# Patient Record
Sex: Male | Born: 1950 | Race: White | Hispanic: No | Marital: Married | Smoking: Former smoker
Health system: Southern US, Community
[De-identification: ages and names within clinical notes are randomized; demographics above are authoritative.]

## PROBLEM LIST (undated history)

## (undated) DIAGNOSIS — J449 Chronic obstructive pulmonary disease, unspecified: Secondary | ICD-10-CM

## (undated) DIAGNOSIS — I209 Angina pectoris, unspecified: Secondary | ICD-10-CM

## (undated) DIAGNOSIS — Z972 Presence of dental prosthetic device (complete) (partial): Secondary | ICD-10-CM

## (undated) DIAGNOSIS — M47812 Spondylosis without myelopathy or radiculopathy, cervical region: Secondary | ICD-10-CM

## (undated) DIAGNOSIS — B009 Herpesviral infection, unspecified: Secondary | ICD-10-CM

## (undated) DIAGNOSIS — J309 Allergic rhinitis, unspecified: Secondary | ICD-10-CM

## (undated) DIAGNOSIS — E7800 Pure hypercholesterolemia, unspecified: Secondary | ICD-10-CM

## (undated) DIAGNOSIS — J432 Centrilobular emphysema: Secondary | ICD-10-CM

## (undated) DIAGNOSIS — I714 Abdominal aortic aneurysm, without rupture, unspecified: Secondary | ICD-10-CM

## (undated) DIAGNOSIS — K219 Gastro-esophageal reflux disease without esophagitis: Secondary | ICD-10-CM

## (undated) DIAGNOSIS — K649 Unspecified hemorrhoids: Secondary | ICD-10-CM

## (undated) DIAGNOSIS — I509 Heart failure, unspecified: Secondary | ICD-10-CM

## (undated) DIAGNOSIS — K5792 Diverticulitis of intestine, part unspecified, without perforation or abscess without bleeding: Secondary | ICD-10-CM

## (undated) DIAGNOSIS — E669 Obesity, unspecified: Secondary | ICD-10-CM

## (undated) DIAGNOSIS — Z8719 Personal history of other diseases of the digestive system: Secondary | ICD-10-CM

## (undated) DIAGNOSIS — I2510 Atherosclerotic heart disease of native coronary artery without angina pectoris: Secondary | ICD-10-CM

## (undated) DIAGNOSIS — R0602 Shortness of breath: Secondary | ICD-10-CM

## (undated) DIAGNOSIS — H269 Unspecified cataract: Secondary | ICD-10-CM

## (undated) DIAGNOSIS — E785 Hyperlipidemia, unspecified: Secondary | ICD-10-CM

## (undated) DIAGNOSIS — I10 Essential (primary) hypertension: Secondary | ICD-10-CM

## (undated) DIAGNOSIS — R739 Hyperglycemia, unspecified: Secondary | ICD-10-CM

## (undated) DIAGNOSIS — L309 Dermatitis, unspecified: Secondary | ICD-10-CM

## (undated) DIAGNOSIS — N200 Calculus of kidney: Secondary | ICD-10-CM

## (undated) DIAGNOSIS — M1990 Unspecified osteoarthritis, unspecified site: Secondary | ICD-10-CM

## (undated) DIAGNOSIS — Z973 Presence of spectacles and contact lenses: Secondary | ICD-10-CM

## (undated) DIAGNOSIS — H3530 Unspecified macular degeneration: Secondary | ICD-10-CM

## (undated) DIAGNOSIS — E119 Type 2 diabetes mellitus without complications: Secondary | ICD-10-CM

## (undated) HISTORY — DX: Calculus of kidney: N20.0

## (undated) HISTORY — PX: EYE SURGERY: SHX253

## (undated) HISTORY — DX: Essential (primary) hypertension: I10

## (undated) HISTORY — DX: Abdominal aortic aneurysm, without rupture, unspecified: I71.40

## (undated) HISTORY — PX: CORONARY ARTERY BYPASS GRAFT: SHX141

## (undated) HISTORY — DX: Heart failure, unspecified: I50.9

## (undated) HISTORY — DX: Hyperlipidemia, unspecified: E78.5

## (undated) HISTORY — PX: CARDIAC CATHETERIZATION: SHX172

## (undated) HISTORY — PX: TONSILLECTOMY: SUR1361

## (undated) HISTORY — DX: Abdominal aortic aneurysm, without rupture: I71.4

---

## 2005-08-02 ENCOUNTER — Emergency Department: Payer: Self-pay | Admitting: Emergency Medicine

## 2005-08-02 ENCOUNTER — Ambulatory Visit: Payer: Self-pay | Admitting: Internal Medicine

## 2005-08-03 ENCOUNTER — Inpatient Hospital Stay (HOSPITAL_COMMUNITY): Admission: AD | Admit: 2005-08-03 | Discharge: 2005-08-08 | Payer: Self-pay | Admitting: Cardiothoracic Surgery

## 2005-08-26 ENCOUNTER — Encounter: Admission: RE | Admit: 2005-08-26 | Discharge: 2005-08-26 | Payer: Self-pay | Admitting: Cardiothoracic Surgery

## 2005-09-10 ENCOUNTER — Other Ambulatory Visit: Payer: Self-pay

## 2005-09-10 ENCOUNTER — Emergency Department: Payer: Self-pay | Admitting: Emergency Medicine

## 2006-10-28 ENCOUNTER — Ambulatory Visit: Payer: Self-pay | Admitting: Emergency Medicine

## 2006-10-31 ENCOUNTER — Ambulatory Visit: Payer: Self-pay | Admitting: Emergency Medicine

## 2006-11-02 ENCOUNTER — Emergency Department: Payer: Self-pay | Admitting: Internal Medicine

## 2007-08-24 ENCOUNTER — Ambulatory Visit: Payer: Self-pay | Admitting: Gastroenterology

## 2008-08-02 ENCOUNTER — Emergency Department: Payer: Self-pay | Admitting: Emergency Medicine

## 2009-06-18 ENCOUNTER — Emergency Department: Payer: Self-pay | Admitting: Emergency Medicine

## 2011-02-07 ENCOUNTER — Encounter: Payer: Self-pay | Admitting: Otolaryngology

## 2011-02-10 ENCOUNTER — Encounter: Payer: Self-pay | Admitting: Otolaryngology

## 2011-03-12 ENCOUNTER — Encounter: Payer: Self-pay | Admitting: Otolaryngology

## 2013-05-14 ENCOUNTER — Ambulatory Visit: Payer: Self-pay | Admitting: Family Medicine

## 2013-06-10 ENCOUNTER — Other Ambulatory Visit: Payer: Self-pay | Admitting: Neurosurgery

## 2013-06-12 ENCOUNTER — Encounter (HOSPITAL_COMMUNITY): Payer: Self-pay | Admitting: Pharmacy Technician

## 2013-06-17 ENCOUNTER — Encounter (HOSPITAL_COMMUNITY): Payer: Self-pay

## 2013-06-17 ENCOUNTER — Encounter (HOSPITAL_COMMUNITY)
Admission: RE | Admit: 2013-06-17 | Discharge: 2013-06-17 | Disposition: A | Discharge status: Home / Self Care | Payer: Federal, State, Local not specified - PPO | Source: Ambulatory Visit | Attending: Neurosurgery | Admitting: Neurosurgery

## 2013-06-17 HISTORY — DX: Atherosclerotic heart disease of native coronary artery without angina pectoris: I25.10

## 2013-06-17 HISTORY — DX: Personal history of other diseases of the digestive system: Z87.19

## 2013-06-17 HISTORY — DX: Shortness of breath: R06.02

## 2013-06-17 HISTORY — DX: Chronic obstructive pulmonary disease, unspecified: J44.9

## 2013-06-17 LAB — BASIC METABOLIC PANEL
BUN: 15 mg/dL (ref 6–23)
CALCIUM: 9.4 mg/dL (ref 8.4–10.5)
CO2: 29 meq/L (ref 19–32)
Chloride: 104 mEq/L (ref 96–112)
Creatinine, Ser: 1.12 mg/dL (ref 0.50–1.35)
GFR calc Af Amer: 78 mL/min — ABNORMAL LOW (ref >90)
GFR, EST NON AFRICAN AMERICAN: 68 mL/min — AB (ref >90)
Glucose, Bld: 103 mg/dL — ABNORMAL HIGH (ref 70–99)
POTASSIUM: 4.9 meq/L (ref 3.7–5.3)
Sodium: 143 mEq/L (ref 137–147)

## 2013-06-17 LAB — SURGICAL PCR SCREEN
MRSA, PCR: NEGATIVE
STAPHYLOCOCCUS AUREUS: NEGATIVE

## 2013-06-17 LAB — CBC
HCT: 44.2 % (ref 39.0–52.0)
Hemoglobin: 15.1 g/dL (ref 13.0–17.0)
MCH: 31.7 pg (ref 26.0–34.0)
MCHC: 34.2 g/dL (ref 30.0–36.0)
MCV: 92.9 fL (ref 78.0–100.0)
Platelets: 181 10*3/uL (ref 150–400)
RBC: 4.76 MIL/uL (ref 4.22–5.81)
RDW: 12.1 % (ref 11.5–15.5)
WBC: 8.1 10*3/uL (ref 4.0–10.5)

## 2013-06-17 NOTE — Pre-Procedure Instructions (Signed)
Melvin Brewer  06/17/2013   Your procedure is scheduled on:  Thursday  06/20/13  Report to [facility]  2 * 3 at 900 AM.  Call this number if you have problems the morning of surgery: [phone]   Remember:   Do not eat food or drink liquids after midnight.   Take these medicines the morning of surgery with A SIP OF WATER:  inhalers, neurontin, hydrocodone if needed for pain, metoprolol(lopressor),  Theodur, spiriva   (STOP ASPIRIN 5 DAYS PRIOR TO SURGERY. STOP ANY HERBAL MEDICINES, NO COUMADIN, PLAVIX, OR EFFIENT.)   Do not wear jewelry, make-up or nail polish.  Do not wear lotions, powders, or perfumes. You may wear deodorant.  Do not shave 48 hours prior to surgery. Men may shave face and neck.  Do not bring valuables to the hospital.  [facility] is not responsible                  for any belongings or valuables.               Contacts, dentures or bridgework may not be worn into surgery.  Leave suitcase in the car. After surgery it may be brought to your room.  For patients admitted to the hospital, discharge time is determined by your                treatment team.               Patients discharged the day of surgery will not be allowed to drive  home.  Name and phone number of your driver:   Special Instructions:  Special Instructions: [place] - Preparing for Surgery  Before surgery, you can play an important role.  Because skin is not sterile, your skin needs to be as free of germs as possible.  You can reduce the number of germs on you skin by washing with CHG (chlorahexidine gluconate) soap before surgery.  CHG is an antiseptic cleaner which kills germs and bonds with the skin to continue killing germs even after washing.  Please DO NOT use if you have an allergy to CHG or antibacterial soaps.  If your skin becomes reddened/irritated stop using the CHG and inform your nurse when you arrive at Short Stay.  Do not shave (including legs and  underarms) for at least 48 hours prior to the first CHG shower.  You may shave your face.  Please follow these instructions carefully:   1.  Shower with CHG Soap the night before surgery and the morning of Surgery.  2.  If you choose to wash your hair, wash your hair first as usual with your normal shampoo.  3.  After you shampoo, rinse your hair and body thoroughly to remove the Shampoo.  4.  Use CHG as you would any other liquid soap. You can apply chg directly to the skin and wash gently with scrungie or a clean washcloth.  5.  Apply the CHG Soap to your body ONLY FROM THE NECK DOWN.  Do not use on open wounds or open sores.  Avoid contact with your eyes, ears, mouth and genitals (private parts).  Wash genitals (private parts with your normal soap.  6.  Wash thoroughly, paying special attention to the area where your surgery will be performed.  7.  Thoroughly rinse your body with warm water from the neck down.  8.  DO NOT shower/wash with your normal soap after using and rinsing  off the CHG Soap.  9.  Pat yourself dry with a clean towel.            10.  Wear clean pajamas.            11.  Place clean sheets on your bed the night of your first shower and do not sleep with pets.  Day of Surgery  Do not apply any lotions/deodorants the morning of surgery.  Please wear clean clothes to the hospital/surgery center.   Please read over the following fact sheets that you were given: Pain Booklet, Coughing and Deep Breathing, MRSA Information and Surgical Site Infection Prevention

## 2013-06-17 NOTE — Progress Notes (Addendum)
REQUESTED LAST CXR AND OV FROM DR. [facility] ([place] [phone])  FAXED STOP BANG LETTER TO DR. [facility] IN [place] [phone])

## 2013-06-17 NOTE — Progress Notes (Signed)
06/17/13 0916  OBSTRUCTIVE SLEEP APNEA  Have you ever been diagnosed with sleep apnea through a sleep study? No  Do you snore loudly (loud enough to be heard through closed doors)?  0  Do you often feel tired, fatigued, or sleepy during the daytime? 1  Has anyone observed you stop breathing during your sleep? 0  Do you have, or are you being treated for high blood pressure? 1  BMI more than 35 kg/m2? 0  Age over 65 years old? 1  Neck circumference greater than 40 cm/18 inches? 0  Gender: 1  Obstructive Sleep Apnea Score 4  Score 4 or greater  Results sent to PCP

## 2013-06-18 NOTE — Progress Notes (Signed)
Anesthesia Chart Review:  Patient is a 63 year old male scheduled for C5-6, C6-7 ACDF on 06/20/13 by Dr. Arnoldo Morale.  History includes CAD s/p CABG 07/2005, former smoker, COPD, hiatal hernia, tonsillectomy. PCP reported as Dr. Hortencia Pilar at [facility]. Cardiologist is Dr. Lujean Amel who cleared patient with low/mild CV risk. He gave permission to hold ASA for 5 days preoperatively.   EKG on 06/17/13 showed NSR.  Echo on 02/07/11 (Dr. Clayborn Bigness) showed EF 55%, mitral and tricuspid valve sclerosis, trace MR, mild TR, RVSP 28 mmHg.  Last cardiac cath was on 08/02/05 prior to his CABG (see report on chart).   Preoperative labs noted.  CXR report is still pending from his pulmonologist Dr. Raul Del.    He has been cleared by cardiology, so anticipate that he can proceed as planned in no acute changes.  George Hugh [facility] Short Stay Center/Anesthesiology Phone [phone] 06/18/2013 5:23 PM

## 2013-06-19 MED ORDER — CEFAZOLIN SODIUM-DEXTROSE 2-3 GM-% IV SOLR
2.0000 g | INTRAVENOUS | Status: AC
  Administered 2013-06-20: 2 g via INTRAVENOUS
  Filled 2013-06-19: qty 50

## 2013-06-20 ENCOUNTER — Ambulatory Visit (HOSPITAL_COMMUNITY): Payer: Federal, State, Local not specified - PPO

## 2013-06-20 ENCOUNTER — Ambulatory Visit (HOSPITAL_COMMUNITY): Payer: Federal, State, Local not specified - PPO | Admitting: Certified Registered Nurse Anesthetist

## 2013-06-20 ENCOUNTER — Encounter (HOSPITAL_COMMUNITY)
Admission: RE | Disposition: A | Discharge status: Home / Self Care | Payer: Self-pay | Source: Ambulatory Visit | Attending: Neurosurgery

## 2013-06-20 ENCOUNTER — Ambulatory Visit (HOSPITAL_COMMUNITY)
Admission: RE | Admit: 2013-06-20 | Discharge: 2013-06-21 | Disposition: A | Discharge status: Home / Self Care | Payer: Federal, State, Local not specified - PPO | Source: Ambulatory Visit | Attending: Neurosurgery | Admitting: Neurosurgery

## 2013-06-20 ENCOUNTER — Encounter (HOSPITAL_COMMUNITY): Payer: Federal, State, Local not specified - PPO | Admitting: Vascular Surgery

## 2013-06-20 ENCOUNTER — Encounter (HOSPITAL_COMMUNITY): Payer: Self-pay | Admitting: Certified Registered Nurse Anesthetist

## 2013-06-20 DIAGNOSIS — J4489 Other specified chronic obstructive pulmonary disease: Secondary | ICD-10-CM | POA: Insufficient documentation

## 2013-06-20 DIAGNOSIS — M4712 Other spondylosis with myelopathy, cervical region: Secondary | ICD-10-CM | POA: Insufficient documentation

## 2013-06-20 DIAGNOSIS — Z01812 Encounter for preprocedural laboratory examination: Secondary | ICD-10-CM | POA: Insufficient documentation

## 2013-06-20 DIAGNOSIS — Z882 Allergy status to sulfonamides status: Secondary | ICD-10-CM | POA: Insufficient documentation

## 2013-06-20 DIAGNOSIS — R0602 Shortness of breath: Secondary | ICD-10-CM | POA: Insufficient documentation

## 2013-06-20 DIAGNOSIS — Z01810 Encounter for preprocedural cardiovascular examination: Secondary | ICD-10-CM | POA: Insufficient documentation

## 2013-06-20 DIAGNOSIS — E669 Obesity, unspecified: Secondary | ICD-10-CM | POA: Insufficient documentation

## 2013-06-20 DIAGNOSIS — J449 Chronic obstructive pulmonary disease, unspecified: Secondary | ICD-10-CM | POA: Insufficient documentation

## 2013-06-20 DIAGNOSIS — Z87891 Personal history of nicotine dependence: Secondary | ICD-10-CM | POA: Insufficient documentation

## 2013-06-20 DIAGNOSIS — M5020 Other cervical disc displacement, unspecified cervical region: Secondary | ICD-10-CM | POA: Insufficient documentation

## 2013-06-20 DIAGNOSIS — I2510 Atherosclerotic heart disease of native coronary artery without angina pectoris: Secondary | ICD-10-CM | POA: Insufficient documentation

## 2013-06-20 HISTORY — PX: ANTERIOR CERVICAL DECOMP/DISCECTOMY FUSION: SHX1161

## 2013-06-20 SURGERY — ANTERIOR CERVICAL DECOMPRESSION/DISCECTOMY FUSION 2 LEVELS
Anesthesia: General | Site: Spine Cervical

## 2013-06-20 MED ORDER — ATORVASTATIN CALCIUM 80 MG PO TABS
80.0000 mg | ORAL_TABLET | Freq: Every day | ORAL | Status: DC
  Administered 2013-06-20 – 2013-06-21 (×2): 80 mg via ORAL
  Filled 2013-06-20 (×2): qty 1

## 2013-06-20 MED ORDER — GLYCOPYRROLATE 0.2 MG/ML IJ SOLN
INTRAMUSCULAR | Status: AC
  Filled 2013-06-20: qty 4

## 2013-06-20 MED ORDER — OXYCODONE-ACETAMINOPHEN 5-325 MG PO TABS: 1.0000 | ORAL_TABLET | ORAL | Status: DC | PRN

## 2013-06-20 MED ORDER — MIDAZOLAM HCL 5 MG/5ML IJ SOLN
INTRAMUSCULAR | Status: DC | PRN
  Administered 2013-06-20: 2 mg via INTRAVENOUS

## 2013-06-20 MED ORDER — HYDROCODONE-ACETAMINOPHEN 10-325 MG PO TABS: 0.5000 | ORAL_TABLET | ORAL | Status: DC | PRN

## 2013-06-20 MED ORDER — HEMOSTATIC AGENTS (NO CHARGE) OPTIME
TOPICAL | Status: DC | PRN
  Administered 2013-06-20: 1 via TOPICAL

## 2013-06-20 MED ORDER — LIDOCAINE HCL (CARDIAC) 20 MG/ML IV SOLN
INTRAVENOUS | Status: AC
  Filled 2013-06-20: qty 10

## 2013-06-20 MED ORDER — LIDOCAINE HCL 4 % MT SOLN
OROMUCOSAL | Status: DC | PRN
  Administered 2013-06-20: 4 mL via TOPICAL

## 2013-06-20 MED ORDER — LACTATED RINGERS IV SOLN
INTRAVENOUS | Status: DC | PRN
  Administered 2013-06-20 (×2): via INTRAVENOUS

## 2013-06-20 MED ORDER — ONDANSETRON HCL 4 MG/2ML IJ SOLN: 4.0000 mg | INTRAMUSCULAR | Status: DC | PRN

## 2013-06-20 MED ORDER — ARTIFICIAL TEARS OP OINT
TOPICAL_OINTMENT | OPHTHALMIC | Status: DC | PRN
  Administered 2013-06-20: 1 via OPHTHALMIC

## 2013-06-20 MED ORDER — PANTOPRAZOLE SODIUM 40 MG IV SOLR
40.0000 mg | Freq: Every day | INTRAVENOUS | Status: DC
  Administered 2013-06-20: 40 mg via INTRAVENOUS
  Filled 2013-06-20 (×2): qty 40

## 2013-06-20 MED ORDER — DOCUSATE SODIUM 100 MG PO CAPS
100.0000 mg | ORAL_CAPSULE | Freq: Two times a day (BID) | ORAL | Status: DC
  Administered 2013-06-20 – 2013-06-21 (×2): 100 mg via ORAL
  Filled 2013-06-20 (×3): qty 1

## 2013-06-20 MED ORDER — HYDROMORPHONE HCL PF 1 MG/ML IJ SOLN
0.2500 mg | INTRAMUSCULAR | Status: DC | PRN
  Administered 2013-06-20 (×2): 0.5 mg via INTRAVENOUS

## 2013-06-20 MED ORDER — PROPOFOL 10 MG/ML IV BOLUS
INTRAVENOUS | Status: DC | PRN
  Administered 2013-06-20: 180 mg via INTRAVENOUS

## 2013-06-20 MED ORDER — FLUTICASONE PROPIONATE 50 MCG/ACT NA SUSP
1.0000 | Freq: Every day | NASAL | Status: DC
  Filled 2013-06-20: qty 16

## 2013-06-20 MED ORDER — FENTANYL CITRATE 0.05 MG/ML IJ SOLN
INTRAMUSCULAR | Status: AC
  Filled 2013-06-20: qty 5

## 2013-06-20 MED ORDER — OLODATEROL HCL 2.5 MCG/ACT IN AERS
1.0000 | INHALATION_SPRAY | Freq: Two times a day (BID) | RESPIRATORY_TRACT | Status: DC
  Administered 2013-06-21: 1 via RESPIRATORY_TRACT

## 2013-06-20 MED ORDER — OXYCODONE HCL 5 MG/5ML PO SOLN: 5.0000 mg | Freq: Once | ORAL | Status: DC | PRN

## 2013-06-20 MED ORDER — NEOSTIGMINE METHYLSULFATE 1 MG/ML IJ SOLN
INTRAMUSCULAR | Status: DC | PRN
  Administered 2013-06-20: 5 mg via INTRAVENOUS

## 2013-06-20 MED ORDER — ALBUMIN HUMAN 5 % IV SOLN
INTRAVENOUS | Status: DC | PRN
  Administered 2013-06-20: 14:00:00 via INTRAVENOUS

## 2013-06-20 MED ORDER — ONDANSETRON HCL 4 MG/2ML IJ SOLN
INTRAMUSCULAR | Status: AC
  Filled 2013-06-20: qty 2

## 2013-06-20 MED ORDER — OXYCODONE HCL 5 MG PO TABS: 5.0000 mg | ORAL_TABLET | Freq: Once | ORAL | Status: DC | PRN

## 2013-06-20 MED ORDER — ALBUTEROL SULFATE (2.5 MG/3ML) 0.083% IN NEBU: 3.0000 mL | INHALATION_SOLUTION | Freq: Four times a day (QID) | RESPIRATORY_TRACT | Status: DC | PRN

## 2013-06-20 MED ORDER — PROMETHAZINE HCL 25 MG/ML IJ SOLN
6.2500 mg | INTRAMUSCULAR | Status: DC | PRN
Start: 2013-06-20 — End: 2013-06-20

## 2013-06-20 MED ORDER — ACETAMINOPHEN 650 MG RE SUPP: 650.0000 mg | RECTAL | Status: DC | PRN

## 2013-06-20 MED ORDER — BUPIVACAINE-EPINEPHRINE PF 0.5-1:200000 % IJ SOLN
INTRAMUSCULAR | Status: DC | PRN
  Administered 2013-06-20: 10 mL

## 2013-06-20 MED ORDER — MENTHOL 3 MG MT LOZG
1.0000 | LOZENGE | OROMUCOSAL | Status: DC | PRN
  Administered 2013-06-20: 3 mg via ORAL
  Filled 2013-06-20: qty 9

## 2013-06-20 MED ORDER — MORPHINE SULFATE 2 MG/ML IJ SOLN
1.0000 mg | INTRAMUSCULAR | Status: DC | PRN
  Administered 2013-06-20: 2 mg via INTRAVENOUS
  Filled 2013-06-20: qty 1

## 2013-06-20 MED ORDER — THEOPHYLLINE ER 200 MG PO TB12
200.0000 mg | ORAL_TABLET | Freq: Two times a day (BID) | ORAL | Status: DC
  Administered 2013-06-20 – 2013-06-21 (×2): 200 mg via ORAL
  Filled 2013-06-20 (×3): qty 1

## 2013-06-20 MED ORDER — ARTIFICIAL TEARS OP OINT
TOPICAL_OINTMENT | OPHTHALMIC | Status: AC
  Filled 2013-06-20: qty 3.5

## 2013-06-20 MED ORDER — DEXAMETHASONE SODIUM PHOSPHATE 4 MG/ML IJ SOLN: 4.0000 mg | Freq: Four times a day (QID) | INTRAMUSCULAR | Status: AC

## 2013-06-20 MED ORDER — MIDAZOLAM HCL 2 MG/2ML IJ SOLN
INTRAMUSCULAR | Status: AC
  Filled 2013-06-20: qty 2

## 2013-06-20 MED ORDER — PHENOL 1.4 % MT LIQD: 1.0000 | OROMUCOSAL | Status: DC | PRN

## 2013-06-20 MED ORDER — TIOTROPIUM BROMIDE MONOHYDRATE 18 MCG IN CAPS
18.0000 ug | ORAL_CAPSULE | Freq: Every day | RESPIRATORY_TRACT | Status: DC
  Administered 2013-06-21: 18 ug via RESPIRATORY_TRACT
  Filled 2013-06-20: qty 5

## 2013-06-20 MED ORDER — BACITRACIN ZINC 500 UNIT/GM EX OINT
TOPICAL_OINTMENT | CUTANEOUS | Status: DC | PRN
  Administered 2013-06-20: 1 via TOPICAL

## 2013-06-20 MED ORDER — ROCURONIUM BROMIDE 50 MG/5ML IV SOLN
INTRAVENOUS | Status: AC
  Filled 2013-06-20: qty 1

## 2013-06-20 MED ORDER — 0.9 % SODIUM CHLORIDE (POUR BTL) OPTIME
TOPICAL | Status: DC | PRN
  Administered 2013-06-20: 1000 mL

## 2013-06-20 MED ORDER — PHENYLEPHRINE HCL 10 MG/ML IJ SOLN
INTRAMUSCULAR | Status: DC | PRN
  Administered 2013-06-20: 80 ug via INTRAVENOUS
  Administered 2013-06-20: 40 ug via INTRAVENOUS
  Administered 2013-06-20: 80 ug via INTRAVENOUS
  Administered 2013-06-20 (×2): 40 ug via INTRAVENOUS
  Administered 2013-06-20: 80 ug via INTRAVENOUS
  Administered 2013-06-20: 40 ug via INTRAVENOUS

## 2013-06-20 MED ORDER — ALUM & MAG HYDROXIDE-SIMETH 200-200-20 MG/5ML PO SUSP: 30.0000 mL | Freq: Four times a day (QID) | ORAL | Status: DC | PRN

## 2013-06-20 MED ORDER — LACTATED RINGERS IV SOLN
INTRAVENOUS | Status: DC
Start: 2013-06-20 — End: 2013-06-21
  Administered 2013-06-20: 09:00:00 via INTRAVENOUS

## 2013-06-20 MED ORDER — AMITRIPTYLINE HCL 50 MG PO TABS
50.0000 mg | ORAL_TABLET | Freq: Every day | ORAL | Status: DC
  Administered 2013-06-20: 50 mg via ORAL
  Filled 2013-06-20 (×2): qty 1

## 2013-06-20 MED ORDER — PHENYLEPHRINE HCL 10 MG/ML IJ SOLN
10.0000 mg | INTRAVENOUS | Status: DC | PRN
  Administered 2013-06-20: 10 ug/min via INTRAVENOUS

## 2013-06-20 MED ORDER — DEXAMETHASONE 4 MG PO TABS
4.0000 mg | ORAL_TABLET | Freq: Four times a day (QID) | ORAL | Status: AC
  Administered 2013-06-20 – 2013-06-21 (×3): 4 mg via ORAL
  Filled 2013-06-20 (×3): qty 1

## 2013-06-20 MED ORDER — HYDROCODONE-ACETAMINOPHEN 5-325 MG PO TABS
1.0000 | ORAL_TABLET | ORAL | Status: DC | PRN
  Administered 2013-06-20 – 2013-06-21 (×2): 1 via ORAL
  Filled 2013-06-20 (×2): qty 1

## 2013-06-20 MED ORDER — GABAPENTIN 300 MG PO CAPS
300.0000 mg | ORAL_CAPSULE | Freq: Three times a day (TID) | ORAL | Status: DC
  Administered 2013-06-20 – 2013-06-21 (×3): 300 mg via ORAL
  Filled 2013-06-20 (×5): qty 1

## 2013-06-20 MED ORDER — GLYCOPYRROLATE 0.2 MG/ML IJ SOLN
INTRAMUSCULAR | Status: DC | PRN
  Administered 2013-06-20: .8 mg via INTRAVENOUS

## 2013-06-20 MED ORDER — DIAZEPAM 5 MG PO TABS: 5.0000 mg | ORAL_TABLET | Freq: Four times a day (QID) | ORAL | Status: DC | PRN

## 2013-06-20 MED ORDER — THROMBIN 5000 UNITS EX SOLR
CUTANEOUS | Status: DC | PRN
  Administered 2013-06-20 (×2): 5000 [IU] via TOPICAL

## 2013-06-20 MED ORDER — FENTANYL CITRATE 0.05 MG/ML IJ SOLN
INTRAMUSCULAR | Status: DC | PRN
  Administered 2013-06-20 (×2): 50 ug via INTRAVENOUS
  Administered 2013-06-20: 100 ug via INTRAVENOUS
  Administered 2013-06-20: 50 ug via INTRAVENOUS

## 2013-06-20 MED ORDER — LACTATED RINGERS IV SOLN: INTRAVENOUS | Status: DC

## 2013-06-20 MED ORDER — ACETAMINOPHEN 325 MG PO TABS: 650.0000 mg | ORAL_TABLET | ORAL | Status: DC | PRN

## 2013-06-20 MED ORDER — CEFAZOLIN SODIUM-DEXTROSE 2-3 GM-% IV SOLR
2.0000 g | Freq: Three times a day (TID) | INTRAVENOUS | Status: AC
  Administered 2013-06-20 – 2013-06-21 (×2): 2 g via INTRAVENOUS
  Filled 2013-06-20 (×2): qty 50

## 2013-06-20 MED ORDER — SODIUM CHLORIDE 0.9 % IR SOLN
Status: DC | PRN
  Administered 2013-06-20: 13:00:00

## 2013-06-20 MED ORDER — MIDAZOLAM HCL 2 MG/2ML IJ SOLN: 1.0000 mg | INTRAMUSCULAR | Status: DC | PRN

## 2013-06-20 MED ORDER — LIDOCAINE HCL (CARDIAC) 20 MG/ML IV SOLN
INTRAVENOUS | Status: DC | PRN
  Administered 2013-06-20: 100 mg via INTRAVENOUS

## 2013-06-20 MED ORDER — STERILE WATER FOR INJECTION IJ SOLN
INTRAMUSCULAR | Status: AC
  Filled 2013-06-20: qty 10

## 2013-06-20 MED ORDER — PHENYLEPHRINE 40 MCG/ML (10ML) SYRINGE FOR IV PUSH (FOR BLOOD PRESSURE SUPPORT)
PREFILLED_SYRINGE | INTRAVENOUS | Status: AC
  Filled 2013-06-20: qty 10

## 2013-06-20 MED ORDER — PROPOFOL 10 MG/ML IV BOLUS
INTRAVENOUS | Status: AC
  Filled 2013-06-20: qty 20

## 2013-06-20 MED ORDER — HYDROMORPHONE HCL PF 1 MG/ML IJ SOLN
INTRAMUSCULAR | Status: AC
Start: 2013-06-20 — End: 2013-06-21
  Filled 2013-06-20: qty 1

## 2013-06-20 MED ORDER — FENTANYL CITRATE 0.05 MG/ML IJ SOLN: 50.0000 ug | Freq: Once | INTRAMUSCULAR | Status: DC

## 2013-06-20 MED ORDER — ROCURONIUM BROMIDE 100 MG/10ML IV SOLN
INTRAVENOUS | Status: DC | PRN
  Administered 2013-06-20: 50 mg via INTRAVENOUS

## 2013-06-20 MED ORDER — METOPROLOL TARTRATE 50 MG PO TABS
50.0000 mg | ORAL_TABLET | Freq: Two times a day (BID) | ORAL | Status: DC
Start: 2013-06-20 — End: 2013-06-21
  Administered 2013-06-20 – 2013-06-21 (×2): 50 mg via ORAL
  Filled 2013-06-20 (×3): qty 1

## 2013-06-20 MED ORDER — EPHEDRINE SULFATE 50 MG/ML IJ SOLN
INTRAMUSCULAR | Status: AC
Start: 2013-06-20 — End: 2013-06-20
  Filled 2013-06-20: qty 1

## 2013-06-20 MED ORDER — ONDANSETRON HCL 4 MG/2ML IJ SOLN
INTRAMUSCULAR | Status: DC | PRN
  Administered 2013-06-20: 4 mg via INTRAVENOUS

## 2013-06-20 SURGICAL SUPPLY — 65 items
APL SKNCLS STERI-STRIP NONHPOA (GAUZE/BANDAGES/DRESSINGS) ×1
BAG DECANTER FOR FLEXI CONT (MISCELLANEOUS) ×2 IMPLANT
BENZOIN TINCTURE PRP APPL 2/3 (GAUZE/BANDAGES/DRESSINGS) ×3 IMPLANT
BIT DRILL NEURO 2X3.1 SFT TUCH (MISCELLANEOUS) ×1 IMPLANT
BLADE SURG 15 STRL LF DISP TIS (BLADE) ×1 IMPLANT
BLADE SURG 15 STRL SS (BLADE) ×2
BLADE ULTRA TIP 2M (BLADE) ×2 IMPLANT
BRUSH SCRUB EZ PLAIN DRY (MISCELLANEOUS) ×2 IMPLANT
BUR BARREL STRAIGHT FLUTE 4.0 (BURR) ×2 IMPLANT
BUR MATCHSTICK NEURO 3.0 LAGG (BURR) ×2 IMPLANT
CANISTER SUCT 3000ML (MISCELLANEOUS) ×2 IMPLANT
CONT SPEC 4OZ CLIKSEAL STRL BL (MISCELLANEOUS) ×2 IMPLANT
COVER MAYO STAND STRL (DRAPES) ×2 IMPLANT
DRAPE LAPAROTOMY 100X72 PEDS (DRAPES) ×2 IMPLANT
DRAPE MICROSCOPE LEICA (MISCELLANEOUS) IMPLANT
DRAPE POUCH INSTRU U-SHP 10X18 (DRAPES) ×2 IMPLANT
DRAPE SURG 17X23 STRL (DRAPES) ×4 IMPLANT
DRILL NEURO 2X3.1 SOFT TOUCH (MISCELLANEOUS) ×2
ELECT REM PT RETURN 9FT ADLT (ELECTROSURGICAL) ×2
ELECTRODE REM PT RTRN 9FT ADLT (ELECTROSURGICAL) ×1 IMPLANT
GAUZE SPONGE 4X4 16PLY XRAY LF (GAUZE/BANDAGES/DRESSINGS) IMPLANT
GLOVE BIO SURGEON STRL SZ8.5 (GLOVE) ×2 IMPLANT
GLOVE BIOGEL PI IND STRL 7.0 (GLOVE) IMPLANT
GLOVE BIOGEL PI IND STRL 7.5 (GLOVE) IMPLANT
GLOVE BIOGEL PI INDICATOR 7.0 (GLOVE) ×3
GLOVE BIOGEL PI INDICATOR 7.5 (GLOVE) ×1
GLOVE ECLIPSE 7.5 STRL STRAW (GLOVE) ×2 IMPLANT
GLOVE ECLIPSE 8.0 STRL XLNG CF (GLOVE) ×1 IMPLANT
GLOVE EXAM NITRILE LRG STRL (GLOVE) IMPLANT
GLOVE EXAM NITRILE MD LF STRL (GLOVE) ×1 IMPLANT
GLOVE EXAM NITRILE XL STR (GLOVE) IMPLANT
GLOVE EXAM NITRILE XS STR PU (GLOVE) IMPLANT
GLOVE SS BIOGEL STRL SZ 6.5 (GLOVE) IMPLANT
GLOVE SS BIOGEL STRL SZ 8 (GLOVE) ×1 IMPLANT
GLOVE SUPERSENSE BIOGEL SZ 6.5 (GLOVE) ×4
GLOVE SUPERSENSE BIOGEL SZ 8 (GLOVE) ×1
GOWN BRE IMP SLV AUR LG STRL (GOWN DISPOSABLE) ×2 IMPLANT
GOWN BRE IMP SLV AUR XL STRL (GOWN DISPOSABLE) ×2 IMPLANT
KIT BASIN OR (CUSTOM PROCEDURE TRAY) ×2 IMPLANT
KIT ROOM TURNOVER OR (KITS) ×2 IMPLANT
MARKER SKIN DUAL TIP RULER LAB (MISCELLANEOUS) ×2 IMPLANT
NDL SPNL 18GX3.5 QUINCKE PK (NEEDLE) ×1 IMPLANT
NEEDLE HYPO 22GX1.5 SAFETY (NEEDLE) ×2 IMPLANT
NEEDLE SPNL 18GX3.5 QUINCKE PK (NEEDLE) ×2 IMPLANT
NS IRRIG 1000ML POUR BTL (IV SOLUTION) ×2 IMPLANT
PACK LAMINECTOMY NEURO (CUSTOM PROCEDURE TRAY) ×2 IMPLANT
PEEK VISTA 14X14X7MM (Peek) ×2 IMPLANT
PIN DISTRACTION 14MM (PIN) ×4 IMPLANT
PLATE ANT CERV XTEND 2 LV 32 (Plate) ×1 IMPLANT
PUTTY 5ML ACTIFUSE ABX (Putty) ×1 IMPLANT
RUBBERBAND STERILE (MISCELLANEOUS) ×2 IMPLANT
SCREW XTD VAR 4.2 SELF TAP (Screw) ×6 IMPLANT
SPONGE GAUZE 4X4 12PLY (GAUZE/BANDAGES/DRESSINGS) ×2 IMPLANT
SPONGE INTESTINAL PEANUT (DISPOSABLE) ×4 IMPLANT
SPONGE SURGIFOAM ABS GEL SZ50 (HEMOSTASIS) ×2 IMPLANT
STRIP CLOSURE SKIN 1/2X4 (GAUZE/BANDAGES/DRESSINGS) ×2 IMPLANT
SUT VIC AB 0 CT1 27 (SUTURE) ×2
SUT VIC AB 0 CT1 27XBRD ANTBC (SUTURE) ×1 IMPLANT
SUT VIC AB 3-0 SH 8-18 (SUTURE) ×2 IMPLANT
SYR 20ML ECCENTRIC (SYRINGE) ×2 IMPLANT
TAPE CLOTH SURG 4X10 WHT LF (GAUZE/BANDAGES/DRESSINGS) ×1 IMPLANT
TAPE STRIPS DRAPE STRL (GAUZE/BANDAGES/DRESSINGS) ×1 IMPLANT
TOWEL OR 17X24 6PK STRL BLUE (TOWEL DISPOSABLE) ×2 IMPLANT
TOWEL OR 17X26 10 PK STRL BLUE (TOWEL DISPOSABLE) ×2 IMPLANT
WATER STERILE IRR 1000ML POUR (IV SOLUTION) ×2 IMPLANT

## 2013-06-20 NOTE — Op Note (Signed)
Brief history: The patient is a 63 year old white male who has complained of neck and arm pain consistent with a cervical radiculopathy. He has failed medical management and was worked up with a cervical MRI. This demonstrated patient had herniated disc at C6-7 and spondylosis at C5-C6. I discussed the various treatment options with the patient including surgery. He has weighed the risks, benefits, and alternatives surgery and decided proceed with a C5-C6 and C6-7 anterior cervical discectomy, fusion, and plating.  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 Earle Gell  Asst.: Dr. Karie Chimera  Anesthesia: Gen. endotracheal  Estimated blood loss: 100 cc  Drains: None  Complications: None  Description of procedure: The patient was brought to the operating room by the anesthesia team. General endotracheal anesthesia was induced. A roll was placed under the patient's shoulders to keep the neck in the neutral position. The patient's anterior cervical region was then prepared with Betadine scrub and Betadine solution. Sterile drapes were applied.  The area to be incised was then injected with Marcaine with epinephrine solution. I then used a scalpel to make a transverse incision in the patient's left anterior neck. I used the Metzenbaum scissors to divide the platysmal muscle and then to dissect medial to the sternocleidomastoid muscle, jugular vein, and carotid artery. I carefully dissected down towards the anterior cervical spine identifying the  esophagus and retracting it medially. Then using Kitner swabs to clear soft tissue from the anterior cervical spine. We then inserted a bent spinal needle into the upper exposed intervertebral disc space. We then obtained intraoperative radiographs confirm our location.  I then used electrocautery to detach the medial border of the longus colli muscle bilaterally from the C5-6 and C6-7 intervertebral disc spaces. I then inserted the Caspar self-retaining retractor underneath the longus colli muscle bilaterally to provide exposure.  We then incised the intervertebral disc at C5-6. We then performed a partial intervertebral discectomy with a pituitary forceps and the Karlin curettes. I then inserted distraction screws into the vertebral bodies at C5 and C6. We then distracted the interspace. We then used the high-speed drill to decorticate the vertebral endplates at Q6-7, to drill away the remainder of the intervertebral disc, to drill away some posterior spondylosis, and to thin out the posterior longitudinal ligament. I then incised ligament with the arachnoid knife. We then removed the ligament with a Kerrison punches undercutting the vertebral endplates and decompressing the thecal sac. We then performed foraminotomies about the bilateral C6 nerve roots. This completed the decompression at this level.  We then repeated this procedure in an analogous fashion at C6-7 decompressing the thecal sac and the bile C7 nerve roots.  We now turned our to attention to the interbody fusion. We used the trial spacers to determine the appropriate size for the interbody prosthesis. We then pre-filled prosthesis with a combination of local morcellized autograft bone that we obtained during decompression as well as Actifuse bone graft extender. We then inserted the prosthesis into the distracted interspace at C5-6 and C6-7. We then removed the distraction screws. There was a good snug fit of the prosthesis in the  interspace.  Having completed the fusion we  now turned attention to the anterior spinal instrumentation. We used the high-speed drill to drill away some anterior spondylosis at the disc spaces so that the plate lay down flat. We selected the appropriate length titanium anterior cervical plate. We laid it along the anterior aspect of the vertebral bodies from C5-C7. We then drilled 14 mm holes at C5, C6 and C7. We then secured the plate to the vertebral bodies by placing two 14 mm self-tapping screws at C5, C6 and C7. We then obtained intraoperative radiograph. The demonstrating good position of the instrumentation. We therefore secured the screws the plate the locking each cam. This completed the instrumentation.  We then obtained hemostasis using bipolar electrocautery. We irrigated the wound out with bacitracin solution. We then removed the retractor. We inspected the esophagus for any damage. There was none apparent. We then reapproximated patient's platysmal muscle with interrupted 3-0 Vicryl suture. We then reapproximated the subcutaneous tissue with interrupted 3-0 Vicryl suture. The skin was reapproximated with Steri-Strips and benzoin. The wound was then covered with bacitracin ointment. A sterile dressing was applied. The drapes were removed. Patient was subsequently extubated by the anesthesia team and transported to the post anesthesia care unit in stable condition. All sponge instrument and needle counts were reportedly correct at the end of this case.

## 2013-06-20 NOTE — Transfer of Care (Signed)
Immediate Anesthesia Transfer of Care Note  Patient: Melvin Brewer  Procedure(s) Performed: Procedure(s) with comments: CERVCIAL FIVE-SIX,CERVICAL SIX-SEVEN ANTERIOR CERVICAL DECOMPRESSION WITH FUSION INTERBODY PROSTHESIS PLATING AND PEEK CAGE (N/A) - anterior  Patient Location: PACU  Anesthesia Type:General  Level of Consciousness: awake and alert   Airway & Oxygen Therapy: Patient Spontanous Breathing and Patient connected to nasal cannula oxygen  Post-op Assessment: Report given to PACU RN, Post -op Vital signs reviewed and stable and Patient moving all extremities X 4  Post vital signs: Reviewed and stable  Complications: No apparent anesthesia complications

## 2013-06-20 NOTE — Anesthesia Preprocedure Evaluation (Addendum)
Anesthesia Evaluation  Patient identified by MRN, date of birth, ID band Patient awake    Reviewed: Allergy & Precautions, H&P , NPO status , Patient's Chart, lab work & pertinent test results  Airway Mallampati: II TM Distance: >3 FB Neck ROM: Limited    Dental  (+) Dental Advisory Given, Partial Upper, Partial Lower   Pulmonary shortness of breath and with exertion, COPD COPD inhaler, former smoker,  + rhonchi         Cardiovascular + CAD and + CABG Rhythm:Regular Rate:Normal     Neuro/Psych    GI/Hepatic hiatal hernia,   Endo/Other    Renal/GU      Musculoskeletal   Abdominal (+) + obese,   Peds  Hematology   Anesthesia Other Findings   Reproductive/Obstetrics                         Anesthesia Physical Anesthesia Plan  ASA: III  Anesthesia Plan: General   Post-op Pain Management:    Induction: Intravenous  Airway Management Planned: Oral ETT and Video Laryngoscope Planned  Additional Equipment:   Intra-op Plan:   Post-operative Plan: Extubation in OR  Informed Consent: I have reviewed the patients History and Physical, chart, labs and discussed the procedure including the risks, benefits and alternatives for the proposed anesthesia with the patient or authorized representative who has indicated his/her understanding and acceptance.   Dental advisory given  Plan Discussed with: CRNA and Surgeon  Anesthesia Plan Comments:        Anesthesia Quick Evaluation

## 2013-06-20 NOTE — Anesthesia Procedure Notes (Signed)
Procedure Name: Intubation Date/Time: 06/20/2013 11:49 AM Performed by: Blair Heys E Pre-anesthesia Checklist: Patient identified, Emergency Drugs available, Suction available and Patient being monitored Patient Re-evaluated:Patient Re-evaluated prior to inductionOxygen Delivery Method: Circle system utilized Preoxygenation: Pre-oxygenation with 100% oxygen Intubation Type: IV induction Ventilation: Mask ventilation without difficulty and Oral airway inserted - appropriate to patient size Grade View: Grade I Tube type: Oral Tube size: 7.5 mm Number of attempts: 1 Airway Equipment and Method: Stylet,  Oral airway and Video-laryngoscopy Placement Confirmation: ETT inserted through vocal cords under direct vision,  positive ETCO2 and breath sounds checked- equal and bilateral Secured at: 22 cm Tube secured with: Tape Dental Injury: Teeth and Oropharynx as per pre-operative assessment

## 2013-06-20 NOTE — Anesthesia Postprocedure Evaluation (Signed)
Anesthesia Post Note  Patient: Melvin Brewer  Procedure(s) Performed: Procedure(s) (LRB): CERVCIAL FIVE-SIX,CERVICAL SIX-SEVEN ANTERIOR CERVICAL DECOMPRESSION WITH FUSION INTERBODY PROSTHESIS PLATING AND PEEK CAGE (N/A)  Anesthesia type: general  Patient location: PACU  Post pain: Pain level controlled  Post assessment: Patient's Cardiovascular Status Stable  Last Vitals:  Filed Vitals:   06/20/13 1600  BP:   Pulse: 77  Temp: 36.4 C  Resp: 20    Post vital signs: Reviewed and stable  Level of consciousness: sedated  Complications: No apparent anesthesia complications

## 2013-06-20 NOTE — Preoperative (Signed)
Beta Blockers   Reason not to administer Beta Blockers:Not Applicable, patient took Metoprolol 06/20/13.

## 2013-06-20 NOTE — Progress Notes (Signed)
Subjective:  The patient is alert. He is in no apparent distress.  Objective: Vital signs in last 24 hours: Temp:  [97.3 F (36.3 C)] 97.3 F (36.3 C) (03/12 0853) Pulse Rate:  [69] 69 (03/12 0853) Resp:  [20] 20 (03/12 0853) BP: (155)/(86) 155/86 mmHg (03/12 0853) SpO2:  [99 %] 99 % (03/12 0853)  Intake/Output from previous day:   Intake/Output this shift: Total I/O In: 2050 [I.V.:1800; IV Piggyback:250] Out: 200 [Blood:200]  Physical exam the patient is moving all 4 extremities well. His dressing is clean and dry. There is no evidence of hematoma or shift.  Lab Results: No results found for this basename: WBC, HGB, HCT, PLT,  in the last 72 hours BMET No results found for this basename: NA, K, CL, CO2, GLUCOSE, BUN, CREATININE, CALCIUM,  in the last 72 hours  Studies/Results: Dg Chest 2 View  06/20/2013   CLINICAL DATA:  Preop cervical fusion  EXAM: CHEST  2 VIEW  COMPARISON:  08/26/2005  FINDINGS: The heart size and mediastinal contours are within normal limits. Both lungs are clear. The visualized skeletal structures are unremarkable.  IMPRESSION: No active cardiopulmonary disease.   Electronically Signed   By: Franchot Gallo M.D.   On: 06/20/2013 09:47   Dg Cervical Spine 2-3 Views  06/20/2013   CLINICAL DATA:  Surgical localization images of the cervical spine  EXAM: CERVICAL SPINE - 2-3 VIEW  COMPARISON:  None.  FINDINGS: Initial lateral image shows the surgical probe with its tip superimposed over the C4-C5 disc space.  Followup image shows placement of an anterior fusion plate and fixation screws spanning C5-C7 with bone graft material maintaining disc space height.  No acute fracture or evidence of an operative complication.  IMPRESSION: Operative localization images of the cervical spine as described.   Electronically Signed   By: Lajean Manes M.D.   On: 06/20/2013 14:45    Assessment/Plan: The patient is doing well.  LOS: 0 days     Abygale Karpf D 06/20/2013,  3:06 PM

## 2013-06-20 NOTE — H&P (Signed)
Subjective: The patient is a 63 year old white male who has complained of neck and arm pain consistent with a cervical radiculopathy. He has failed medical management and was worked up with a cervical MRI. This demonstrated disc degeneration, spondylosis, stenosis, etc. at C5-6 and C6-7. I discussed the various treatment option with the patient including surgery. He has weighed the risks, benefits, and alternatives surgery and decided proceed with a C5-C6 and C6-7 anterior cervical discectomy, fusion, and plating.   Past Medical History  Diagnosis Date  . Coronary artery disease   . COPD (chronic obstructive pulmonary disease)   . Shortness of breath     W/ EXERTION   . H/O hiatal hernia     AGE 82 S  NO MEDS    Past Surgical History  Procedure Laterality Date  . Coronary artery bypass graft      2007  _0   . Tonsillectomy    . Cardiac catheterization      [facility]  2007   . Eye surgery      RT EYE LASER (RET DETACHMENT)    Allergies  Allergen Reactions  . Sulfa Antibiotics Hives    History  Substance Use Topics  . Smoking status: Former Research scientist (life sciences)  . Smokeless tobacco: Not on file  . Alcohol Use: Yes     Comment: SOCIAL     History reviewed. No pertinent family history. Prior to Admission medications   Medication Sig Start Date End Date Taking? Authorizing Provider  albuterol (PROVENTIL HFA;VENTOLIN HFA) 108 (90 BASE) MCG/ACT inhaler Inhale 2 puffs into the lungs every 6 (six) hours as needed for wheezing or shortness of breath.   Yes Historical Provider, MD  amitriptyline (ELAVIL) 50 MG tablet Take 50 mg by mouth at bedtime.   Yes Historical Provider, MD  aspirin 325 MG EC tablet Take 325 mg by mouth daily.   Yes Historical Provider, MD  atorvastatin (LIPITOR) 80 MG tablet Take 80 mg by mouth daily.   Yes Historical Provider, MD  fluticasone (FLONASE) 50 MCG/ACT nasal spray Place 1 spray into both nostrils daily.   Yes Historical Provider, MD  gabapentin (NEURONTIN) 300 MG  capsule Take 300 mg by mouth 3 (three) times daily.   Yes Historical Provider, MD  HYDROcodone-acetaminophen (NORCO) 10-325 MG per tablet Take 0.5-1 tablets by mouth every 4 (four) hours as needed for moderate pain.   Yes Historical Provider, MD  metoprolol (LOPRESSOR) 50 MG tablet Take 50 mg by mouth 2 (two) times daily.   Yes Historical Provider, MD  Olodaterol HCl (STRIVERDI RESPIMAT) 2.5 MCG/ACT AERS Inhale 1 puff into the lungs 2 (two) times daily.   Yes Historical Provider, MD  theophylline (THEODUR) 200 MG 12 hr tablet Take 200 mg by mouth 2 (two) times daily.   Yes Historical Provider, MD  tiotropium (SPIRIVA) 18 MCG inhalation capsule Place 18 mcg into inhaler and inhale daily.   Yes Historical Provider, MD     Review of Systems  Positive ROS: As above  All other systems have been reviewed and were otherwise negative with the exception of those mentioned in the HPI and as above.  Objective: Vital signs in last 24 hours: Temp:  [97.3 F (36.3 C)] 97.3 F (36.3 C) (03/12 0853) Pulse Rate:  [69] 69 (03/12 0853) Resp:  [20] 20 (03/12 0853) BP: (155)/(86) 155/86 mmHg (03/12 0853) SpO2:  [99 %] 99 % (03/12 0853)  General Appearance: Alert, cooperative, no distress, Head: Normocephalic, without obvious abnormality, atraumatic Eyes: PERRL, conjunctiva/corneas clear, EOM's  intact,    Ears: Normal  Throat: Normal  Neck: Supple, symmetrical, trachea midline, no adenopathy; thyroid: No enlargement/tenderness/nodules; no carotid bruit or JVD Back: Symmetric, no curvature, ROM normal, no CVA tenderness Lungs: Clear to auscultation bilaterally, respirations unlabored Heart: Regular rate and rhythm, no murmur, rub or gallop Abdomen: Soft, non-tender,, no masses, no organomegaly Extremities: Extremities normal, atraumatic, no cyanosis or edema Pulses: 2+ and symmetric all extremities Skin: Skin color, texture, turgor normal, no rashes or lesions  NEUROLOGIC:   Mental status: alert and  oriented, no aphasia, good attention span, Fund of knowledge/ memory ok Motor Exam - grossly normal Sensory Exam - grossly normal Reflexes:  Coordination - grossly normal Gait - grossly normal Balance - grossly normal Cranial Nerves: I: smell Not tested  II: visual acuity  OS: Normal  OD: Normal   II: visual fields Full to confrontation  II: pupils Equal, round, reactive to light  III,VII: ptosis None  III,IV,VI: extraocular muscles  Full ROM  V: mastication Normal  V: facial light touch sensation  Normal  V,VII: corneal reflex  Present  VII: facial muscle function - upper  Normal  VII: facial muscle function - lower Normal  VIII: hearing Not tested  IX: soft palate elevation  Normal  IX,X: gag reflex Present  XI: trapezius strength  5/5  XI: sternocleidomastoid strength 5/5  XI: neck flexion strength  5/5  XII: tongue strength  Normal    Data Review Lab Results  Component Value Date   WBC 8.1 06/17/2013   HGB 15.1 06/17/2013   HCT 44.2 06/17/2013   MCV 92.9 06/17/2013   PLT 181 06/17/2013   Lab Results  Component Value Date   NA 143 06/17/2013   K 4.9 06/17/2013   CL 104 06/17/2013   CO2 29 06/17/2013   BUN 15 06/17/2013   CREATININE 1.12 06/17/2013   GLUCOSE 103* 06/17/2013   No results found for this basename: INR, PROTIME    Assessment/Plan: C5-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 I reviewed his imaging studies with them and pointed out the abnormalities. We have discussed the various treatment options including surgery. I described the surgical treatment option of a C5-C6 and C6-7 anterior cervical discectomy, fusion, and plating. I've shown him surgical models. We have discussed the risks, benefits, alternatives, and likelihood of achieving our goals with surgery. I have answered all the patient's questions. The patient has decided to proceed with surgery.   Sallye Lunz D 06/20/2013  11:35 AM

## 2013-06-21 MED ORDER — DIAZEPAM 5 MG PO TABS: 5.0000 mg | ORAL_TABLET | Freq: Four times a day (QID) | ORAL | Status: DC | PRN

## 2013-06-21 MED ORDER — OXYCODONE-ACETAMINOPHEN 5-325 MG PO TABS: 1.0000 | ORAL_TABLET | ORAL | Status: DC | PRN

## 2013-06-21 MED ORDER — PANTOPRAZOLE SODIUM 40 MG PO TBEC: 40.0000 mg | DELAYED_RELEASE_TABLET | Freq: Every day | ORAL | Status: DC

## 2013-06-21 MED ORDER — DSS 100 MG PO CAPS: 100.0000 mg | ORAL_CAPSULE | Freq: Two times a day (BID) | ORAL | Status: DC

## 2013-06-21 NOTE — Progress Notes (Signed)
Pt. Alert and oriented, follows simple instructions, denies pain. Incision area without swelling, redness or S/S of infection. Voiding adequate clear yellow urine. Moving all extremities well and vitals stable and documented. Patient discharged home with spouse. Anterior Cervical Fusion surgery notes instructions given to patient and family member for home safety and precautions. Pt. and family stated understanding of instructions given.  

## 2013-06-21 NOTE — Anesthesia Postprocedure Evaluation (Signed)
  Anesthesia Post-op Note  Patient: Melvin Brewer  Procedure(s) Performed: Procedure(s) with comments: CERVCIAL FIVE-SIX,CERVICAL SIX-SEVEN ANTERIOR CERVICAL DECOMPRESSION WITH FUSION INTERBODY PROSTHESIS PLATING AND PEEK CAGE (N/A) - anterior  Patient Location: PACU  Anesthesia Type:General  Level of Consciousness: awake  Airway and Oxygen Therapy: Patient Spontanous Breathing  Post-op Pain: mild  Post-op Assessment: Post-op Vital signs reviewed, Patient's Cardiovascular Status Stable, Respiratory Function Stable, Patent Airway, No signs of Nausea or vomiting and Pain level controlled  Post-op Vital Signs: Reviewed and stable  Complications: No apparent anesthesia complications

## 2013-06-21 NOTE — Discharge Summary (Signed)
Physician Discharge Summary  Patient ID: Melvin Brewer MRN: [id number] DOB/AGE: 1950/10/26 63 y.o.  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 The surgery went well.  The patient's postoperative course was unremarkable. On postop day #1 the patient requested discharge to home. He was given oral and written discharge instructions. All his questions were answered.  Consults: None Significant Diagnostic Studies: None Treatments: C5-6 and C6-7 anterior cervical discectomy, fusion, and plating Discharge Exam: Blood pressure 161/91, pulse 88, temperature 98.5 F (36.9 C), temperature source Oral, resp. rate 20, SpO2 93.00%. The patient is alert and pleasant. He looks well. He is in no apparent distress. His strength is normal.  The patient's dressing has a bloodstain. There is no evidence of hematoma or shift.  Disposition: Home  Discharge Orders   Future Orders Complete By Expires   Call MD for:  difficulty breathing, headache or visual disturbances  As directed    Call MD for:  extreme fatigue  As directed    Call MD for:  hives  As directed    Call MD for:  persistant dizziness or light-headedness  As directed    Call MD for:  persistant nausea and vomiting  As directed    Call MD for:  redness, tenderness, or signs of infection (pain, swelling, redness, odor or green/yellow discharge around incision site)  As directed    Call MD for:  severe uncontrolled pain  As directed    Call MD for:  temperature >100.4  As directed    Diet - low sodium heart healthy  As directed    Discharge instructions  As  directed    Comments:     Call [phone] for a followup appointment. Take a stool softener while you are using pain medications.   Driving Restrictions  As directed    Comments:     Do not drive for 2 weeks.   Increase activity slowly  As directed    Lifting restrictions  As directed    Comments:     Do not lift more than 5 pounds. No excessive bending or twisting.   May shower / Bathe  As directed    Comments:     He may shower after the pain she is removed 3 days after surgery. Leave the incision alone.   Remove dressing in 48 hours  As directed    Comments:     Your stitches are under the scan and will dissolve by themselves. The Steri-Strips will fall off after you take a few showers. Do not rub back or pick at the wound, Leave the wound alone.       Medication List    STOP taking these medications       HYDROcodone-acetaminophen 10-325 MG per tablet  Commonly known as:  NORCO      TAKE these medications       albuterol 108 (90 BASE) MCG/ACT inhaler  Commonly known as:  PROVENTIL HFA;VENTOLIN HFA  Inhale 2 puffs into the lungs every 6 (six) hours as needed for wheezing or shortness of breath.     amitriptyline 50 MG tablet  Commonly known as:  ELAVIL  Take 50 mg by mouth at bedtime.  aspirin 325 MG EC tablet  Take 325 mg by mouth daily.     atorvastatin 80 MG tablet  Commonly known as:  LIPITOR  Take 80 mg by mouth daily.     diazepam 5 MG tablet  Commonly known as:  VALIUM  Take 1 tablet (5 mg total) by mouth every 6 (six) hours as needed for muscle spasms.     DSS 100 MG Caps  Take 100 mg by mouth 2 (two) times daily.     fluticasone 50 MCG/ACT nasal spray  Commonly known as:  FLONASE  Place 1 spray into both nostrils daily.     gabapentin 300 MG capsule  Commonly known as:  NEURONTIN  Take 300 mg by mouth 3 (three) times daily.     metoprolol 50 MG tablet  Commonly known as:  LOPRESSOR  Take 50 mg by mouth 2 (two) times daily.      oxyCODONE-acetaminophen 5-325 MG per tablet  Commonly known as:  PERCOCET/ROXICET  Take 1-2 tablets by mouth every 4 (four) hours as needed for moderate pain.     STRIVERDI RESPIMAT 2.5 MCG/ACT Aers  Generic drug:  Olodaterol HCl  Inhale 1 puff into the lungs 2 (two) times daily.     theophylline 200 MG 12 hr tablet  Commonly known as:  THEODUR  Take 200 mg by mouth 2 (two) times daily.     tiotropium 18 MCG inhalation capsule  Commonly known as:  SPIRIVA  Place 18 mcg into inhaler and inhale daily.         SignedNewman Pies D 06/21/2013, 10:01 AM

## 2013-06-25 ENCOUNTER — Encounter (HOSPITAL_COMMUNITY): Payer: Self-pay | Admitting: Neurosurgery

## 2013-12-17 ENCOUNTER — Ambulatory Visit: Payer: Self-pay | Admitting: Family Medicine

## 2016-04-12 ENCOUNTER — Encounter: Payer: Medicare Other | Attending: Specialist | Admitting: Respiratory Therapy

## 2016-04-12 VITALS — Ht 70.0 in | Wt 219.4 lb

## 2016-04-12 DIAGNOSIS — J449 Chronic obstructive pulmonary disease, unspecified: Secondary | ICD-10-CM | POA: Diagnosis present

## 2016-04-12 NOTE — Progress Notes (Signed)
Pulmonary Individual Treatment Plan  Patient Details  Name: Melvin Brewer MRN: [id number] Date of Birth: [birth year] Referring Provider:    Initial Encounter Date:  Flowsheet Row Pulmonary Rehab from 04/12/2016 in [facility] Cardiac and Pulmonary Rehab  Date  04/12/16      Visit Diagnosis: COPD, moderate ([place])  Patient's Home Medications on Admission:  Current Outpatient Prescriptions:    albuterol (PROVENTIL HFA;VENTOLIN HFA) 108 (90 BASE) MCG/ACT inhaler, Inhale 2 puffs into the lungs every 6 (six) hours as needed for wheezing or shortness of breath., Disp: , Rfl:    amitriptyline (ELAVIL) 50 MG tablet, Take 50 mg by mouth at bedtime., Disp: , Rfl:    aspirin 325 MG EC tablet, Take 325 mg by mouth daily., Disp: , Rfl:    atorvastatin (LIPITOR) 80 MG tablet, Take 80 mg by mouth daily., Disp: , Rfl:    diazepam (VALIUM) 5 MG tablet, Take 1 tablet (5 mg total) by mouth every 6 (six) hours as needed for muscle spasms., Disp: 50 tablet, Rfl: 0   docusate sodium 100 MG CAPS, Take 100 mg by mouth 2 (two) times daily., Disp: 60 capsule, Rfl: 0   fluticasone (FLONASE) 50 MCG/ACT nasal spray, Place 1 spray into both nostrils daily., Disp: , Rfl:    gabapentin (NEURONTIN) 300 MG capsule, Take 300 mg by mouth 3 (three) times daily., Disp: , Rfl:    metoprolol (LOPRESSOR) 50 MG tablet, Take 50 mg by mouth 2 (two) times daily., Disp: , Rfl:    Olodaterol HCl (STRIVERDI RESPIMAT) 2.5 MCG/ACT AERS, Inhale 1 puff into the lungs 2 (two) times daily., Disp: , Rfl:    oxyCODONE-acetaminophen (PERCOCET/ROXICET) 5-325 MG per tablet, Take 1-2 tablets by mouth every 4 (four) hours as needed for moderate pain., Disp: 100 tablet, Rfl: 0   theophylline (THEODUR) 200 MG 12 hr tablet, Take 200 mg by mouth 2 (two) times daily., Disp: , Rfl:    tiotropium (SPIRIVA) 18 MCG inhalation capsule, Place 18 mcg into inhaler and inhale daily., Disp: , Rfl:   Past Medical History: Past Medical History:   Diagnosis Date   COPD (chronic obstructive pulmonary disease)    Coronary artery disease    H/O hiatal hernia    AGE 19 S  NO MEDS   Shortness of breath    W/ EXERTION     Tobacco Use: History  Smoking Status   Former Smoker  Smokeless Tobacco   Not on file    Labs: Recent Review Flowsheet Data    There is no flowsheet data to display.       ADL [facility]SD:     Pulmonary Assessment Scores    Row Name 04/12/16 1159         ADL UCSD   ADL Phase Entry     SOB Score total 9     Rest 0     Walk 0     Stairs 2     Bath 1     Dress 1     Shop 0        Pulmonary Function Assessment:     Pulmonary Function Assessment - 04/12/16 1157      Initial Spirometry Results   FVC% 31 %   FEV1% 28 %   FEV1/FVC Ratio 65.82   Comments Test date - 04/12/16     Post Bronchodilator Spirometry Results   FVC% 29 %   FEV1% 24 %   FEV1/FVC Ratio 68.23     Breath  Bilateral Breath Sounds Clear;Decreased   Shortness of Breath Yes;Limiting activity      Exercise Target Goals: Date: 04/12/16  Exercise Program Goal: Individual exercise prescription set with THRR, safety & activity barriers. Participant demonstrates ability to understand and report RPE using BORG scale, to self-measure pulse accurately, and to acknowledge the importance of the exercise prescription.  Exercise Prescription Goal: Starting with aerobic activity 30 plus minutes a day, 3 days per week for initial exercise prescription. Provide home exercise prescription and guidelines that participant acknowledges understanding prior to discharge.  Activity Barriers & Risk Stratification:     Activity Barriers & Cardiac Risk Stratification - 04/12/16 1157      Activity Barriers & Cardiac Risk Stratification   Activity Barriers Deconditioning;Muscular Weakness;Shortness of Breath;Arthritis   Cardiac Risk Stratification Moderate      6 Minute Walk:     6 Minute Walk    Row Name 04/12/16 1246          6 Minute Walk   Distance 1340 feet     Walk Time 6 minutes     # of Rest Breaks 0     MPH 2.54     METS 2.91     RPE 11     Perceived Dyspnea  2     VO2 Peak 9.46     Symptoms No     Resting HR 73 bpm     Resting BP 130/68     Max Ex. HR 84 bpm     Max Ex. BP 138/68       Interval HR   Baseline HR 73     1 Minute HR 77     2 Minute HR 82     3 Minute HR 83     4 Minute HR 84     5 Minute HR 84     6 Minute HR 84     Interval Heart Rate? Yes       Interval Oxygen   Interval Oxygen? Yes     Baseline Oxygen Saturation % 93 %     1 Minute Oxygen Saturation % 95 %     2 Minute Oxygen Saturation % 92 %     3 Minute Oxygen Saturation % 94 %     4 Minute Oxygen Saturation % 96 %     5 Minute Oxygen Saturation % 95 %     6 Minute Oxygen Saturation % 97 %        Initial Exercise Prescription:     Initial Exercise Prescription - 04/12/16 1200      Date of Initial Exercise RX and Referring Provider   Date 04/12/16     Treadmill   MPH 2.3   Grade 0.5   Minutes 15   METs 2.9     Recumbant Bike   Level 3   RPM 60   Minutes 15   METs 2.9     Elliptical   Level 1   Speed 2.5   Minutes 15  3/3/3   METs 2.9     REL-XR   Level 4   Minutes 15   METs 2.9     T5 Nustep   Level 3   Minutes 15   METs 2.9     Prescription Details   Frequency (times per week) 3   Duration Progress to 45 minutes of aerobic exercise without signs/symptoms of physical distress     Intensity   THRR 40-80% of Max Heartrate  105-137   Ratings of Perceived Exertion 11-15   Perceived Dyspnea 2-4     Progression   Progression Continue to progress workloads to maintain intensity without signs/symptoms of physical distress.     Resistance Training   Training Prescription Yes   Weight 3   Reps 10-15      Perform Capillary Blood Glucose checks as needed.  Exercise Prescription Changes:   Exercise Comments:   Discharge Exercise Prescription (Final Exercise Prescription  Changes):    Nutrition:  Target Goals: Understanding of nutrition guidelines, daily intake of sodium 1500mg , cholesterol 200mg , calories 30% from fat and 7% or less from saturated fats, daily to have 5 or more servings of fruits and vegetables.  Biometrics:     Pre Biometrics - 04/12/16 1245      Pre Biometrics   Height 5\' 10"  (1.778 m)   Weight 219 lb 6.4 oz (99.5 kg)   Waist Circumference 48.5 inches   Hip Circumference 45 inches   Waist to Hip Ratio 1.08 %   BMI (Calculated) 31.5       Nutrition Therapy Plan and Nutrition Goals:   Nutrition Discharge: Rate Your Plate Scores:   Psychosocial: Target Goals: Acknowledge presence or absence of depression, maximize coping skills, provide positive support system. Participant is able to verbalize types and ability to use techniques and skills needed for reducing stress and depression.  Initial Review & Psychosocial Screening:     Initial Psych Review & Screening - 04/12/16 [place]? Yes   Comments Melvin Brewer has good support from his wife and son. He has his [place] faith and no depression. Melvin Brewer is looking forward to participating in [place], getting back to exercise and weight loss.     Barriers   Psychosocial barriers to participate in program The patient should benefit from training in stress management and relaxation.     Screening Interventions   Interventions Encouraged to exercise      Quality of Life Scores:     Quality of Life - 04/12/16 1206      Quality of Life Scores   Health/Function Pre 20.73 %   Socioeconomic Pre 20.29 %   Psych/Spiritual Pre 21 %   Family Pre 21 %   GLOBAL Pre 20.74 %      PHQ-9: Recent Review Flowsheet Data    Depression screen [facility] 2/9 04/12/2016   Decreased Interest 0   Down, Depressed, Hopeless 0   PHQ - 2 Score 0   Altered sleeping 1   Tired, decreased energy 0   Change in appetite 1   Feeling bad or failure  about yourself  0   Trouble concentrating 0   Moving slowly or fidgety/restless 0   Suicidal thoughts 0   PHQ-9 Score 2   Difficult doing work/chores Not difficult at all      Psychosocial Evaluation and Intervention:   Psychosocial Re-Evaluation:  Education: Education Goals: Education classes will be provided on a weekly basis, covering required topics. Participant will state understanding/return demonstration of topics presented.  Learning Barriers/Preferences:     Learning Barriers/Preferences - 04/12/16 1157      Learning Barriers/Preferences   Learning Barriers None   Learning Preferences None      Education Topics: Initial Evaluation Education: - Verbal, written and demonstration of respiratory meds, RPE/PD scales, oximetry and breathing techniques. Instruction on use of nebulizers and MDIs: cleaning and proper use, rinsing mouth with steroid  doses and importance of monitoring MDI activations. Flowsheet Row Pulmonary Rehab from 04/12/2016 in [facility] Cardiac and Pulmonary Rehab  Date  04/12/16  Educator  LB  Instruction Review Code  2- meets goals/outcomes      General Nutrition Guidelines/Fats and Fiber: -Group instruction provided by verbal, written material, models and posters to present the general guidelines for heart healthy nutrition. Gives an explanation and review of dietary fats and fiber.   Controlling Sodium/Reading Food Labels: -Group verbal and written material supporting the discussion of sodium use in heart healthy nutrition. Review and explanation with models, verbal and written materials for utilization of the food label.   Exercise Physiology & Risk Factors: - Group verbal and written instruction with models to review the exercise physiology of the cardiovascular system and associated critical values. Details cardiovascular disease risk factors and the goals associated with each risk factor.   Aerobic Exercise & Resistance Training: - Gives group  verbal and written discussion on the health impact of inactivity. On the components of aerobic and resistive training programs and the benefits of this training and how to safely progress through these programs.   Flexibility, Balance, General Exercise Guidelines: - Provides group verbal and written instruction on the benefits of flexibility and balance training programs. Provides general exercise guidelines with specific guidelines to those with heart or lung disease. Demonstration and skill practice provided.   Stress Management: - Provides group verbal and written instruction about the health risks of elevated stress, cause of high stress, and healthy ways to reduce stress.   Depression: - Provides group verbal and written instruction on the correlation between heart/lung disease and depressed mood, treatment options, and the stigmas associated with seeking treatment.   Exercise & Equipment Safety: - Individual verbal instruction and demonstration of equipment use and safety with use of the equipment.   Infection Prevention: - Provides verbal and written material to individual with discussion of infection control including proper hand washing and proper equipment cleaning during exercise session. Flowsheet Row Pulmonary Rehab from 04/12/2016 in [facility] Cardiac and Pulmonary Rehab  Date  04/12/16  Educator  LB  Instruction Review Code  2- meets goals/outcomes      Falls Prevention: - Provides verbal and written material to individual with discussion of falls prevention and safety. Flowsheet Row Pulmonary Rehab from 04/12/2016 in [facility] Cardiac and Pulmonary Rehab  Date  04/12/16  Educator  LB  Instruction Review Code  2- meets goals/outcomes      Diabetes: - Individual verbal and written instruction to review signs/symptoms of diabetes, desired ranges of glucose level fasting, after meals and with exercise. Advice that pre and post exercise glucose checks will be done for 3 sessions at  entry of program.   Chronic Lung Diseases: - Group verbal and written instruction to review new updates, new respiratory medications, new advancements in procedures and treatments. Provide informative websites and "800" numbers of self-education.   Lung Procedures: - Group verbal and written instruction to describe testing methods done to diagnose lung disease. Review the outcome of test results. Describe the treatment choices: Pulmonary Function Tests, ABGs and oximetry.   Energy Conservation: - Provide group verbal and written instruction for methods to conserve energy, plan and organize activities. Instruct on pacing techniques, use of adaptive equipment and posture/positioning to relieve shortness of breath.   Triggers: - Group verbal and written instruction to review types of environmental controls: home humidity, furnaces, filters, dust mite/pet prevention, HEPA vacuums. To discuss weather changes, air quality  and the benefits of nasal washing.   Exacerbations: - Group verbal and written instruction to provide: warning signs, infection symptoms, calling MD promptly, preventive modes, and value of vaccinations. Review: effective airway clearance, coughing and/or vibration techniques. Create an Sports administrator.   Oxygen: - Individual and group verbal and written instruction on oxygen therapy. Includes supplement oxygen, available portable oxygen systems, continuous and intermittent flow rates, oxygen safety, concentrators, and Medicare reimbursement for oxygen.   Respiratory Medications: - Group verbal and written instruction to review medications for lung disease. Drug class, frequency, complications, importance of spacers, rinsing mouth after steroid MDI's, and proper cleaning methods for nebulizers. Flowsheet Row Pulmonary Rehab from 04/12/2016 in [facility] Cardiac and Pulmonary Rehab  Date  04/12/16  Educator  LB  Instruction Review Code  2- meets goals/outcomes      AED/CPR: -  Group verbal and written instruction with the use of models to demonstrate the basic use of the AED with the basic ABC's of resuscitation.   Breathing Retraining: - Provides individuals verbal and written instruction on purpose, frequency, and proper technique of diaphragmatic breathing and pursed-lipped breathing. Applies individual practice skills. Flowsheet Row Pulmonary Rehab from 04/12/2016 in [facility] Cardiac and Pulmonary Rehab  Date  04/12/16  Educator  LB  Instruction Review Code  2- meets goals/outcomes      Anatomy and Physiology of the Lungs: - Group verbal and written instruction with the use of models to provide basic lung anatomy and physiology related to function, structure and complications of lung disease.   Heart Failure: - Group verbal and written instruction on the basics of heart failure: signs/symptoms, treatments, explanation of ejection fraction, enlarged heart and cardiomyopathy.   Sleep Apnea: - Individual verbal and written instruction to review Obstructive Sleep Apnea. Review of risk factors, methods for diagnosing and types of masks and machines for OSA.   Anxiety: - Provides group, verbal and written instruction on the correlation between heart/lung disease and anxiety, treatment options, and management of anxiety.   Relaxation: - Provides group, verbal and written instruction about the benefits of relaxation for patients with heart/lung disease. Also provides patients with examples of relaxation techniques.   Knowledge Questionnaire Score:     Knowledge Questionnaire Score - 04/12/16 1157      Knowledge Questionnaire Score   Pre Score 6/10       Core Components/Risk Factors/Patient Goals at Admission:     Personal Goals and Risk Factors at Admission - 04/12/16 1201      Core Components/Risk Factors/Patient Goals on Admission    Weight Management Yes;Weight Loss   Intervention Weight Management: Provide education and appropriate resources to  help participant work on and attain dietary goals.   Admit Weight 223 lb (101.2 kg)   Goal Weight: Short Term 198 lb (89.8 kg)   Goal Weight: Long Term 180 lb (81.6 kg)   Expected Outcomes Short Term: Continue to assess and modify interventions until short term weight is achieved;Long Term: Adherence to nutrition and physical activity/exercise program aimed toward attainment of established weight goal;Weight Loss: Understanding of general recommendations for a balanced deficit meal plan, which promotes 1-2 lb weight loss per week and includes a negative energy balance of [phone] kcal/d;Understanding recommendations for meals to include 15-35% energy as protein, 25-35% energy from fat, 35-60% energy from carbohydrates, less than 200mg  of dietary cholesterol, 20-35 gm of total fiber daily;Understanding of distribution of calorie intake throughout the day with the consumption of 4-5 meals/snacks   Sedentary Yes  Melvin Brewer has been walking 1.24miles several days/week. 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   Intervention Provide advice, education, support and counseling about physical activity/exercise needs.;Develop an individualized exercise prescription for aerobic and resistive training based on initial evaluation findings, risk stratification, comorbidities and participant's personal goals.   Expected Outcomes Achievement of increased cardiorespiratory fitness and enhanced flexibility, muscular endurance and strength shown through measurements of functional capacity and personal statement of participant.   Increase Strength and Stamina Yes   Intervention Provide advice, education, support and counseling about physical activity/exercise needs.;Develop an individualized exercise prescription for aerobic and resistive training based on initial evaluation findings, risk stratification, comorbidities and participant's personal goals.   Expected Outcomes Achievement of increased cardiorespiratory  fitness and enhanced flexibility, muscular endurance and strength shown through measurements of functional capacity and personal statement of participant.   Improve shortness of breath with ADL's Yes   Intervention Provide education, individualized exercise plan and daily activity instruction to help decrease symptoms of SOB with activities of daily living.   Expected Outcomes Short Term: Achieves a reduction of symptoms when performing activities of daily living.   Develop more efficient breathing techniques such as purse lipped breathing and diaphragmatic breathing; and practicing self-pacing with activity Yes   Intervention Provide education, demonstration and support about specific breathing techniuqes utilized for more efficient breathing. Include techniques such as pursed lipped breathing, diaphragmatic breathing and self-pacing activity.   Expected Outcomes Short Term: Participant will be able to demonstrate and use breathing techniques as needed throughout daily activities.   Increase knowledge of respiratory medications and ability to use respiratory devices properly  Yes  Symbicort, ProAir, Spiriva, Spacer given with instructions; Theophylline   Intervention Provide education and demonstration as needed of appropriate use of medications, inhalers, and oxygen therapy.   Expected Outcomes Short Term: Achieves understanding of medications use. Understands that oxygen is a medication prescribed by physician. Demonstrates appropriate use of inhaler and oxygen therapy.   Hypertension Yes   Intervention Provide education on lifestyle modifcations including regular physical activity/exercise, weight management, moderate sodium restriction and increased consumption of fresh fruit, vegetables, and low fat dairy, alcohol moderation, and smoking cessation.;Monitor prescription use compliance.   Expected Outcomes Short Term: Continued assessment and intervention until BP is < 140/18mm HG in hypertensive  participants. < 130/28mm HG in hypertensive participants with diabetes, heart failure or chronic kidney disease.;Long Term: Maintenance of blood pressure at goal levels.   Lipids Yes   Intervention Provide education and support for participant on nutrition & aerobic/resistive exercise along with prescribed medications to achieve LDL 70mg , HDL >40mg .   Expected Outcomes Short Term: Participant states understanding of desired cholesterol values and is compliant with medications prescribed. Participant is following exercise prescription and nutrition guidelines.;Long Term: Cholesterol controlled with medications as prescribed, with individualized exercise RX and with personalized nutrition plan. Value goals: LDL < 70mg , HDL > 40 mg.      Core Components/Risk Factors/Patient Goals Review:    Core Components/Risk Factors/Patient Goals at Discharge (Final Review):    ITP Comments:   Comments: Melvin Brewer plans to start [facility] on 04/18/16 and attend 3 days/week.

## 2016-04-12 NOTE — Patient Instructions (Signed)
Patient Instructions  Patient Details  Name: Melvin Brewer MRN: [id number] Date of Birth: October 07, 1950 Referring Provider:  Erby Pian, MD  Below are the personal goals you chose as well as exercise and nutrition goals. Our goal is to help you keep on track towards obtaining and maintaining your goals. We will be discussing your progress on these goals with you throughout the program.  Initial Exercise Prescription:     Initial Exercise Prescription - 04/12/16 1200      Date of Initial Exercise RX and Referring Provider   Date 04/12/16     Treadmill   MPH 2.3   Grade 0.5   Minutes 15   METs 2.9     Recumbant Bike   Level 3   RPM 60   Minutes 15   METs 2.9     Elliptical   Level 1   Speed 2.5   Minutes 15  3/3/3   METs 2.9     REL-XR   Level 4   Minutes 15   METs 2.9     T5 Nustep   Level 3   Minutes 15   METs 2.9     Prescription Details   Frequency (times per week) 3   Duration Progress to 45 minutes of aerobic exercise without signs/symptoms of physical distress     Intensity   THRR 40-80% of Max Heartrate 105-137   Ratings of Perceived Exertion 11-15   Perceived Dyspnea 2-4     Progression   Progression Continue to progress workloads to maintain intensity without signs/symptoms of physical distress.     Resistance Training   Training Prescription Yes   Weight 3   Reps 10-15      Exercise Goals: Frequency: Be able to perform aerobic exercise three times per week working toward 3-5 days per week.  Intensity: Work with a perceived exertion of 11 (fairly light) - 15 (hard) as tolerated. Follow your new exercise prescription and watch for changes in prescription as you progress with the program. Changes will be reviewed with you when they are made.  Duration: You should be able to do 30 minutes of continuous aerobic exercise in addition to a 5 minute warm-up and a 5 minute cool-down routine.  Nutrition Goals: Your personal nutrition  goals will be established when you do your nutrition analysis with the dietician.  The following are nutrition guidelines to follow: Cholesterol < 200mg /day Sodium < 1500mg /day Fiber: Men over 50 yrs - 30 grams per day  Personal Goals:     Personal Goals and Risk Factors at Admission - 04/12/16 1201      Core Components/Risk Factors/Patient Goals on Admission    Weight Management Yes;Weight Loss   Intervention Weight Management: Provide education and appropriate resources to help participant work on and attain dietary goals.   Admit Weight 223 lb (101.2 kg)   Goal Weight: Short Term 198 lb (89.8 kg)   Goal Weight: Long Term 180 lb (81.6 kg)   Expected Outcomes Short Term: Continue to assess and modify interventions until short term weight is achieved;Long Term: Adherence to nutrition and physical activity/exercise program aimed toward attainment of established weight goal;Weight Loss: Understanding of general recommendations for a balanced deficit meal plan, which promotes 1-2 lb weight loss per week and includes a negative energy balance of [phone] kcal/d;Understanding recommendations for meals to include 15-35% energy as protein, 25-35% energy from fat, 35-60% energy from carbohydrates, less than 200mg  of dietary cholesterol, 20-35 gm of total fiber  daily;Understanding of distribution of calorie intake throughout the day with the consumption of 4-5 meals/snacks   Sedentary Yes  Mr Zwicker has been walking 1.5miles several days/week. 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   Intervention Provide advice, education, support and counseling about physical activity/exercise needs.;Develop an individualized exercise prescription for aerobic and resistive training based on initial evaluation findings, risk stratification, comorbidities and participant's personal goals.   Expected Outcomes Achievement of increased cardiorespiratory fitness and enhanced flexibility, muscular endurance and  strength shown through measurements of functional capacity and personal statement of participant.   Increase Strength and Stamina Yes   Intervention Provide advice, education, support and counseling about physical activity/exercise needs.;Develop an individualized exercise prescription for aerobic and resistive training based on initial evaluation findings, risk stratification, comorbidities and participant's personal goals.   Expected Outcomes Achievement of increased cardiorespiratory fitness and enhanced flexibility, muscular endurance and strength shown through measurements of functional capacity and personal statement of participant.   Improve shortness of breath with ADL's Yes   Intervention Provide education, individualized exercise plan and daily activity instruction to help decrease symptoms of SOB with activities of daily living.   Expected Outcomes Short Term: Achieves a reduction of symptoms when performing activities of daily living.   Develop more efficient breathing techniques such as purse lipped breathing and diaphragmatic breathing; and practicing self-pacing with activity Yes   Intervention Provide education, demonstration and support about specific breathing techniuqes utilized for more efficient breathing. Include techniques such as pursed lipped breathing, diaphragmatic breathing and self-pacing activity.   Expected Outcomes Short Term: Participant will be able to demonstrate and use breathing techniques as needed throughout daily activities.   Increase knowledge of respiratory medications and ability to use respiratory devices properly  Yes  Symbicort, ProAir, Spiriva, Spacer given with instructions; Theophylline   Intervention Provide education and demonstration as needed of appropriate use of medications, inhalers, and oxygen therapy.   Expected Outcomes Short Term: Achieves understanding of medications use. Understands that oxygen is a medication prescribed by physician.  Demonstrates appropriate use of inhaler and oxygen therapy.   Hypertension Yes   Intervention Provide education on lifestyle modifcations including regular physical activity/exercise, weight management, moderate sodium restriction and increased consumption of fresh fruit, vegetables, and low fat dairy, alcohol moderation, and smoking cessation.;Monitor prescription use compliance.   Expected Outcomes Short Term: Continued assessment and intervention until BP is < 140/14mm HG in hypertensive participants. < 130/62mm HG in hypertensive participants with diabetes, heart failure or chronic kidney disease.;Long Term: Maintenance of blood pressure at goal levels.   Lipids Yes   Intervention Provide education and support for participant on nutrition & aerobic/resistive exercise along with prescribed medications to achieve LDL 70mg , HDL >40mg .   Expected Outcomes Short Term: Participant states understanding of desired cholesterol values and is compliant with medications prescribed. Participant is following exercise prescription and nutrition guidelines.;Long Term: Cholesterol controlled with medications as prescribed, with individualized exercise RX and with personalized nutrition plan. Value goals: LDL < 70mg , HDL > 40 mg.      Tobacco Use Initial Evaluation: History  Smoking Status   Former Smoker  Smokeless Tobacco   Not on file    Copy of goals given to participant.

## 2016-04-18 ENCOUNTER — Encounter: Payer: Medicare Other | Admitting: *Deleted

## 2016-04-18 DIAGNOSIS — J449 Chronic obstructive pulmonary disease, unspecified: Secondary | ICD-10-CM

## 2016-04-18 NOTE — Progress Notes (Signed)
Daily Session Note  Patient Details  Name: Melvin Brewer MRN: [id number] Date of Birth: 1950-06-12 Referring Provider:    Encounter Date: 04/18/2016  Check In:     Session Check In - 04/18/16 1018      Check-In   Location [facility]   Staff Present Carson Myrtle, BS, RRT, Respiratory Dareen Piano, BA, ACSM CEP, Exercise Physiologist;Kelly Amedeo Plenty, BS, ACSM CEP, Exercise Physiologist   Supervising physician immediately available to respond to emergencies [facility] immediately available ER MD   Physician(s) Drs. McShane and Stafford   Medication changes reported     No   Fall or balance concerns reported    No   Warm-up and Cool-down Performed on first and last piece of equipment   Resistance Training Performed Yes   VAD Patient? No     Pain Assessment   Currently in Pain? No/denies   Multiple Pain Sites No           Exercise Prescription Changes - 04/18/16 1100      Response to Exercise   Blood Pressure (Admit) 120/70   Blood Pressure (Exercise) 148/84   Blood Pressure (Exit) 114/68   Heart Rate (Admit) 67 bpm   Heart Rate (Exercise) 111 bpm   Heart Rate (Exit) 90 bpm   Oxygen Saturation (Admit) 94 %   Oxygen Saturation (Exercise) 91 %   Oxygen Saturation (Exit) 92 %   Rating of Perceived Exertion (Exercise) 15   Perceived Dyspnea (Exercise) 3   Duration Progress to 45 minutes of aerobic exercise without signs/symptoms of physical distress   Intensity THRR unchanged     Progression   Progression Continue to progress workloads to maintain intensity without signs/symptoms of physical distress.     Resistance Training   Training Prescription Yes   Weight 3   Reps 10-15     Interval Training   Interval Training No     Treadmill   MPH 2.3   Grade 0.5   Minutes 15   METs 2.9     Recumbant Bike   Level 3   RPM 50   Minutes 15   METs 2.5     Elliptical   Level 1   Minutes 10  1/1/1      Goals Met:  Proper  associated with RPD/PD & O2 Sat Independence with exercise equipment Exercise tolerated well Strength training completed today  Goals Unmet:  Not Applicable  Comments: First full day of exercise!  Patient was oriented to gym and equipment including functions, settings, policies, and procedures.  Patient's individual exercise prescription and treatment plan were reviewed.  All starting workloads were established based on the results of the 6 minute walk test done at initial orientation visit.  The plan for exercise progression was also introduced and progression will be customized based on patient's performance and goals.    Dr. Emily Filbert is Medical Director for [facility] and [facility].

## 2016-04-20 DIAGNOSIS — J449 Chronic obstructive pulmonary disease, unspecified: Secondary | ICD-10-CM

## 2016-04-20 NOTE — Progress Notes (Signed)
Daily Session Note  Patient Details  Name: Melvin Brewer MRN: [id number] Date of Birth: 01/17/1951 Referring Provider:    Encounter Date: 04/20/2016  Check In:     Session Check In - 04/20/16 1140      Check-In   Location [facility]   Staff Present Carson Myrtle, BS, RRT, Respiratory Lennie Hummer, MA, ACSM RCEP, Exercise Physiologist;Kyland No Oletta Darter, BA, ACSM CEP, Exercise Physiologist   Supervising physician immediately available to respond to emergencies [facility] immediately available ER MD   Physician(s) Darl Householder and Alfred Levins   Medication changes reported     No   Fall or balance concerns reported    No   Warm-up and Cool-down Performed as group-led instruction   Resistance Training Performed Yes   VAD Patient? No     Pain Assessment   Currently in Pain? No/denies   Multiple Pain Sites No         Goals Met:  Proper associated with RPD/PD & O2 Sat Independence with exercise equipment Exercise tolerated well Strength training completed today  Goals Unmet:  Not Applicable  Comments: Pt able to follow exercise prescription today without complaint.  Will continue to monitor for progression.    Dr. Emily Filbert is Medical Director for [facility] and [facility].

## 2016-04-22 ENCOUNTER — Encounter: Payer: Medicare Other | Admitting: *Deleted

## 2016-04-22 DIAGNOSIS — J449 Chronic obstructive pulmonary disease, unspecified: Secondary | ICD-10-CM | POA: Diagnosis not present

## 2016-04-22 NOTE — Progress Notes (Signed)
Daily Session Note  Patient Details  Name: Melvin Brewer MRN: [id number] Date of Birth: 1950/04/29 Referring Provider:    Encounter Date: 04/22/2016  Check In:     Session Check In - 04/22/16 1017      Check-In   Location [facility]   Staff Present Gerlene Burdock, RN, Vickki Hearing, BA, ACSM CEP, Exercise Physiologist;Patricia Surles RN BSN   Supervising physician immediately available to respond to emergencies [facility] immediately available ER MD   Physician(s) Dr. Jimmye Norman Dr. Jacqualine Code   Medication changes reported     No   Fall or balance concerns reported    No   Warm-up and Cool-down Performed as group-led instruction   Resistance Training Performed Yes   VAD Patient? No     Pain Assessment   Currently in Pain? No/denies         Goals Met:  Proper associated with RPD/PD & O2 Sat Exercise tolerated well  Goals Unmet:  Not Applicable  Comments:     Dr. Emily Filbert is Medical Director for [place] [facility] and [facility].

## 2016-04-25 ENCOUNTER — Encounter: Payer: Self-pay | Admitting: Respiratory Therapy

## 2016-04-25 ENCOUNTER — Encounter: Payer: Medicare Other | Admitting: *Deleted

## 2016-04-25 DIAGNOSIS — J449 Chronic obstructive pulmonary disease, unspecified: Secondary | ICD-10-CM | POA: Diagnosis not present

## 2016-04-25 NOTE — Progress Notes (Signed)
Pulmonary Individual Treatment Plan  Patient Details  Name: Melvin Brewer MRN: [id number] Date of Birth: [birth year] Referring Provider:    Initial Encounter Date:  Flowsheet Row Pulmonary Rehab from 04/12/2016 in [facility] Cardiac and Pulmonary Rehab  Date  04/12/16      Visit Diagnosis: COPD, moderate ([place])  Patient's Home Medications on Admission:  Current Outpatient Prescriptions:    albuterol (PROVENTIL HFA;VENTOLIN HFA) 108 (90 BASE) MCG/ACT inhaler, Inhale 2 puffs into the lungs every 6 (six) hours as needed for wheezing or shortness of breath., Disp: , Rfl:    amitriptyline (ELAVIL) 50 MG tablet, Take 50 mg by mouth at bedtime., Disp: , Rfl:    aspirin 325 MG EC tablet, Take 325 mg by mouth daily., Disp: , Rfl:    atorvastatin (LIPITOR) 80 MG tablet, Take 80 mg by mouth daily., Disp: , Rfl:    diazepam (VALIUM) 5 MG tablet, Take 1 tablet (5 mg total) by mouth every 6 (six) hours as needed for muscle spasms., Disp: 50 tablet, Rfl: 0   docusate sodium 100 MG CAPS, Take 100 mg by mouth 2 (two) times daily., Disp: 60 capsule, Rfl: 0   fluticasone (FLONASE) 50 MCG/ACT nasal spray, Place 1 spray into both nostrils daily., Disp: , Rfl:    gabapentin (NEURONTIN) 300 MG capsule, Take 300 mg by mouth 3 (three) times daily., Disp: , Rfl:    metoprolol (LOPRESSOR) 50 MG tablet, Take 50 mg by mouth 2 (two) times daily., Disp: , Rfl:    Olodaterol HCl (STRIVERDI RESPIMAT) 2.5 MCG/ACT AERS, Inhale 1 puff into the lungs 2 (two) times daily., Disp: , Rfl:    oxyCODONE-acetaminophen (PERCOCET/ROXICET) 5-325 MG per tablet, Take 1-2 tablets by mouth every 4 (four) hours as needed for moderate pain., Disp: 100 tablet, Rfl: 0   theophylline (THEODUR) 200 MG 12 hr tablet, Take 200 mg by mouth 2 (two) times daily., Disp: , Rfl:    tiotropium (SPIRIVA) 18 MCG inhalation capsule, Place 18 mcg into inhaler and inhale daily., Disp: , Rfl:   Past Medical History: Past Medical History:   Diagnosis Date   COPD (chronic obstructive pulmonary disease)    Coronary artery disease    H/O hiatal hernia    AGE 19 S  NO MEDS   Shortness of breath    W/ EXERTION     Tobacco Use: History  Smoking Status   Former Smoker  Smokeless Tobacco   Not on file    Labs: Recent Review Flowsheet Data    There is no flowsheet data to display.       ADL UCSD:     Pulmonary Assessment Scores    Row Name 04/12/16 1159         ADL UCSD   ADL Phase Entry     SOB Score total 9     Rest 0     Walk 0     Stairs 2     Bath 1     Dress 1     Shop 0        Pulmonary Function Assessment:     Pulmonary Function Assessment - 04/12/16 1157      Initial Spirometry Results   FVC% 31 %   FEV1% 28 %   FEV1/FVC Ratio 65.82   Comments Test date - 04/12/16     Post Bronchodilator Spirometry Results   FVC% 29 %   FEV1% 24 %   FEV1/FVC Ratio 68.23     Breath  Bilateral Breath Sounds Clear;Decreased   Shortness of Breath Yes;Limiting activity      Exercise Target Goals:    Exercise Program Goal: Individual exercise prescription set with THRR, safety & activity barriers. Participant demonstrates ability to understand and report RPE using BORG scale, to self-measure pulse accurately, and to acknowledge the importance of the exercise prescription.  Exercise Prescription Goal: Starting with aerobic activity 30 plus minutes a day, 3 days per week for initial exercise prescription. Provide home exercise prescription and guidelines that participant acknowledges understanding prior to discharge.  Activity Barriers & Risk Stratification:     Activity Barriers & Cardiac Risk Stratification - 04/12/16 1157      Activity Barriers & Cardiac Risk Stratification   Activity Barriers Deconditioning;Muscular Weakness;Shortness of Breath;Arthritis   Cardiac Risk Stratification Moderate      6 Minute Walk:     6 Minute Walk    Row Name 04/12/16 1246         6 Minute  Walk   Distance 1340 feet     Walk Time 6 minutes     # of Rest Breaks 0     MPH 2.54     METS 2.91     RPE 11     Perceived Dyspnea  2     VO2 Peak 9.46     Symptoms No     Resting HR 73 bpm     Resting BP 130/68     Max Ex. HR 84 bpm     Max Ex. BP 138/68       Interval HR   Baseline HR 73     1 Minute HR 77     2 Minute HR 82     3 Minute HR 83     4 Minute HR 84     5 Minute HR 84     6 Minute HR 84     Interval Heart Rate? Yes       Interval Oxygen   Interval Oxygen? Yes     Baseline Oxygen Saturation % 93 %     1 Minute Oxygen Saturation % 95 %     2 Minute Oxygen Saturation % 92 %     3 Minute Oxygen Saturation % 94 %     4 Minute Oxygen Saturation % 96 %     5 Minute Oxygen Saturation % 95 %     6 Minute Oxygen Saturation % 97 %        Initial Exercise Prescription:     Initial Exercise Prescription - 04/12/16 1200      Date of Initial Exercise RX and Referring Provider   Date 04/12/16     Treadmill   MPH 2.3   Grade 0.5   Minutes 15   METs 2.9     Recumbant Bike   Level 3   RPM 60   Minutes 15   METs 2.9     Elliptical   Level 1   Speed 2.5   Minutes 15  3/3/3   METs 2.9     REL-XR   Level 4   Minutes 15   METs 2.9     T5 Nustep   Level 3   Minutes 15   METs 2.9     Prescription Details   Frequency (times per week) 3   Duration Progress to 45 minutes of aerobic exercise without signs/symptoms of physical distress     Intensity   THRR 40-80% of Max Heartrate  105-137   Ratings of Perceived Exertion 11-15   Perceived Dyspnea 2-4     Progression   Progression Continue to progress workloads to maintain intensity without signs/symptoms of physical distress.     Resistance Training   Training Prescription Yes   Weight 3   Reps 10-15      Perform Capillary Blood Glucose checks as needed.  Exercise Prescription Changes:     Exercise Prescription Changes    Row Name 04/18/16 1100             Response to Exercise    Blood Pressure (Admit) 120/70       Blood Pressure (Exercise) 148/84       Blood Pressure (Exit) 114/68       Heart Rate (Admit) 67 bpm       Heart Rate (Exercise) 111 bpm       Heart Rate (Exit) 90 bpm       Oxygen Saturation (Admit) 94 %       Oxygen Saturation (Exercise) 91 %       Oxygen Saturation (Exit) 92 %       Rating of Perceived Exertion (Exercise) 15       Perceived Dyspnea (Exercise) 3       Duration Progress to 45 minutes of aerobic exercise without signs/symptoms of physical distress       Intensity THRR unchanged         Progression   Progression Continue to progress workloads to maintain intensity without signs/symptoms of physical distress.         Resistance Training   Training Prescription Yes       Weight 3       Reps 10-15         Interval Training   Interval Training No         Treadmill   MPH 2.3       Grade 0.5       Minutes 15       METs 2.9         Recumbant Bike   Level 3       RPM 50       Minutes 15       METs 2.5         Elliptical   Level 1       Minutes 10  1/1/1          Exercise Comments:     Exercise Comments    Row Name 04/18/16 1045           Exercise Comments First full day of exercise!  Patient was oriented to gym and equipment including functions, settings, policies, and procedures.  Patient's individual exercise prescription and treatment plan were reviewed.  All starting workloads were established based on the results of the 6 minute walk test done at initial orientation visit.  The plan for exercise progression was also introduced and progression will be customized based on patient's performance and goals.          Discharge Exercise Prescription (Final Exercise Prescription Changes):     Exercise Prescription Changes - 04/18/16 1100      Response to Exercise   Blood Pressure (Admit) 120/70   Blood Pressure (Exercise) 148/84   Blood Pressure (Exit) 114/68   Heart Rate (Admit) 67 bpm   Heart Rate  (Exercise) 111 bpm   Heart Rate (Exit) 90 bpm   Oxygen Saturation (Admit) 94 %   Oxygen Saturation (Exercise)  91 %   Oxygen Saturation (Exit) 92 %   Rating of Perceived Exertion (Exercise) 15   Perceived Dyspnea (Exercise) 3   Duration Progress to 45 minutes of aerobic exercise without signs/symptoms of physical distress   Intensity THRR unchanged     Progression   Progression Continue to progress workloads to maintain intensity without signs/symptoms of physical distress.     Resistance Training   Training Prescription Yes   Weight 3   Reps 10-15     Interval Training   Interval Training No     Treadmill   MPH 2.3   Grade 0.5   Minutes 15   METs 2.9     Recumbant Bike   Level 3   RPM 50   Minutes 15   METs 2.5     Elliptical   Level 1   Minutes 10  1/1/1       Nutrition:  Target Goals: Understanding of nutrition guidelines, daily intake of sodium <1579m, cholesterol <2059m calories 30% from fat and 7% or less from saturated fats, daily to have 5 or more servings of fruits and vegetables.  Biometrics:     Pre Biometrics - 04/12/16 1245      Pre Biometrics   Height _0  (1.778 m)   Weight 219 lb 6.4 oz (99.5 kg)   Waist Circumference 48.5 inches   Hip Circumference 45 inches   Waist to Hip Ratio 1.08 %   BMI (Calculated) 31.5       Nutrition Therapy Plan and Nutrition Goals:   Nutrition Discharge: Rate Your Plate Scores:   Psychosocial: Target Goals: Acknowledge presence or absence of depression, maximize coping skills, provide positive support system. Participant is able to verbalize types and ability to use techniques and skills needed for reducing stress and depression.  Initial Review & Psychosocial Screening:     Initial Psych Review & Screening - 04/12/16 [place]   Comments Mr DiDemaraisas good support from his wife and son. He has his [place] and no depression. Mr DiCoffins  looking forward to participating in [place] to exercise and weight loss.     Barriers   Psychosocial barriers to participate in program The patient should benefit from training in stress management and relaxation.     Screening Interventions   Interventions Encouraged to exercise      Quality of Life Scores:     Quality of Life - 04/12/16 1206      Quality of Life Scores   Health/Function Pre 20.73 %   Socioeconomic Pre 20.29 %   Psych/Spiritual Pre 21 %   Family Pre 21 %   GLOBAL Pre 20.74 %      PHQ-9: Recent Review Flowsheet Data    [facility] 04/[facility]   Decreased Interest 0   Down, Depressed, Hopeless 0   PHQ - 2 Score 0   Altered sleeping 1   Tired, decreased energy 0   Change in appetite 1   Feeling bad or failure about yourself  0   Trouble concentrating 0   Moving slowly or fidgety/restless 0   Suicidal thoughts 0   PHQ-9 Score 2   Difficult doing work/chores Not difficult at all      Psychosocial Evaluation and Intervention:     Psychosocial Evaluation - 04/20/16 1109      Psychosocial Evaluation & Interventions   Interventions Encouraged to exercise  with the program and follow exercise prescription   Comments Counselor met with Mr. D today for initial psychosocial evaluation.  He is a 66 year old that was diagnosed with COPD 8-10 years ago.  He has a stron support system with a spouse of 32 years and active involvement in his local church.  Mr. D sleeps well and has a good appetite.  He denies a history of depression or anxiety or any current symptoms; and reports he is typically in a positive mood most of the time.  He has minimal stress in his life at this time.  Mr. D has goals to be educated on the limits of exercising with this diagnosis; and just to breathe better in general.  Staff will follow with Mr. D throughout the course of this program.        Psychosocial Re-Evaluation:  Education: Education Goals:  Education classes will be provided on a weekly basis, covering required topics. Participant will state understanding/return demonstration of topics presented.  Learning Barriers/Preferences:     Learning Barriers/Preferences - 04/12/16 1157      Learning Barriers/Preferences   Learning Barriers None   Learning Preferences None      Education Topics: Initial Evaluation Education: - Verbal, written and demonstration of respiratory meds, RPE/PD scales, oximetry and breathing techniques. Instruction on use of nebulizers and MDIs: cleaning and proper use, rinsing mouth with steroid doses and importance of monitoring MDI activations. Flowsheet Row Pulmonary Rehab from 04/20/2016 in [facility] Cardiac and Pulmonary Rehab  Date  04/12/16  Educator  LB  Instruction Review Code  2- meets goals/outcomes      General Nutrition Guidelines/Fats and Fiber: -Group instruction provided by verbal, written material, models and posters to present the general guidelines for heart healthy nutrition. Gives an explanation and review of dietary fats and fiber.   Controlling Sodium/Reading Food Labels: -Group verbal and written material supporting the discussion of sodium use in heart healthy nutrition. Review and explanation with models, verbal and written materials for utilization of the food label.   Exercise Physiology & Risk Factors: - Group verbal and written instruction with models to review the exercise physiology of the cardiovascular system and associated critical values. Details cardiovascular disease risk factors and the goals associated with each risk factor.   Aerobic Exercise & Resistance Training: - Gives group verbal and written discussion on the health impact of inactivity. On the components of aerobic and resistive training programs and the benefits of this training and how to safely progress through these programs.   Flexibility, Balance, General Exercise Guidelines: - Provides group  verbal and written instruction on the benefits of flexibility and balance training programs. Provides general exercise guidelines with specific guidelines to those with heart or lung disease. Demonstration and skill practice provided.   Stress Management: - Provides group verbal and written instruction about the health risks of elevated stress, cause of high stress, and healthy ways to reduce stress.   Depression: - Provides group verbal and written instruction on the correlation between heart/lung disease and depressed mood, treatment options, and the stigmas associated with seeking treatment.   Exercise & Equipment Safety: - Individual verbal instruction and demonstration of equipment use and safety with use of the equipment. Flowsheet Row Pulmonary Rehab from 04/20/2016 in [facility] Cardiac and Pulmonary Rehab  Date  04/18/16  Educator  [facility]  Instruction Review Code  2- meets goals/outcomes      Infection Prevention: - Provides verbal and written material to individual with discussion  of infection control including proper hand washing and proper equipment cleaning during exercise session. Flowsheet Row Pulmonary Rehab from 04/20/2016 in [facility] Cardiac and Pulmonary Rehab  Date  04/18/16  Educator  [facility]  Instruction Review Code  2- meets goals/outcomes      Falls Prevention: - Provides verbal and written material to individual with discussion of falls prevention and safety. Flowsheet Row Pulmonary Rehab from 04/20/2016 in [facility] Cardiac and Pulmonary Rehab  Date  04/12/16  Educator  LB  Instruction Review Code  2- meets goals/outcomes      Diabetes: - Individual verbal and written instruction to review signs/symptoms of diabetes, desired ranges of glucose level fasting, after meals and with exercise. Advice that pre and post exercise glucose checks will be done for 3 sessions at entry of program.   Chronic Lung Diseases: - Group verbal and written instruction to review new updates, new  respiratory medications, new advancements in procedures and treatments. Provide informative websites and "800" numbers of self-education.   Lung Procedures: - Group verbal and written instruction to describe testing methods done to diagnose lung disease. Review the outcome of test results. Describe the treatment choices: Pulmonary Function Tests, ABGs and oximetry.   Energy Conservation: - Provide group verbal and written instruction for methods to conserve energy, plan and organize activities. Instruct on pacing techniques, use of adaptive equipment and posture/positioning to relieve shortness of breath.   Triggers: - Group verbal and written instruction to review types of environmental controls: home humidity, furnaces, filters, dust mite/pet prevention, HEPA vacuums. To discuss weather changes, air quality and the benefits of nasal washing.   Exacerbations: - Group verbal and written instruction to provide: warning signs, infection symptoms, calling MD promptly, preventive modes, and value of vaccinations. Review: effective airway clearance, coughing and/or vibration techniques. Create an Sports administrator.   Oxygen: - Individual and group verbal and written instruction on oxygen therapy. Includes supplement oxygen, available portable oxygen systems, continuous and intermittent flow rates, oxygen safety, concentrators, and Medicare reimbursement for oxygen.   Respiratory Medications: - Group verbal and written instruction to review medications for lung disease. Drug class, frequency, complications, importance of spacers, rinsing mouth after steroid MDI's, and proper cleaning methods for nebulizers. Flowsheet Row Pulmonary Rehab from 04/20/2016 in [facility] Cardiac and Pulmonary Rehab  Date  04/12/16  Educator  LB  Instruction Review Code  2- meets goals/outcomes      AED/CPR: - Group verbal and written instruction with the use of models to demonstrate the basic use of the AED with the basic  ABC's of resuscitation.   Breathing Retraining: - Provides individuals verbal and written instruction on purpose, frequency, and proper technique of diaphragmatic breathing and pursed-lipped breathing. Applies individual practice skills. Flowsheet Row Pulmonary Rehab from 04/20/2016 in [facility] Cardiac and Pulmonary Rehab  Date  04/18/16  Educator  [facility]  Instruction Review Code  2- meets goals/outcomes      Anatomy and Physiology of the Lungs: - Group verbal and written instruction with the use of models to provide basic lung anatomy and physiology related to function, structure and complications of lung disease.   Heart Failure: - Group verbal and written instruction on the basics of heart failure: signs/symptoms, treatments, explanation of ejection fraction, enlarged heart and cardiomyopathy.   Sleep Apnea: - Individual verbal and written instruction to review Obstructive Sleep Apnea. Review of risk factors, methods for diagnosing and types of masks and machines for OSA.   Anxiety: - Provides group, verbal and written instruction  on the correlation between heart/lung disease and anxiety, treatment options, and management of anxiety. Flowsheet Row Pulmonary Rehab from 04/20/2016 in [facility] Cardiac and Pulmonary Rehab  Date  04/20/16  Educator  [facility]  Instruction Review Code  2- Meets goals/outcomes      Relaxation: - Provides group, verbal and written instruction about the benefits of relaxation for patients with heart/lung disease. Also provides patients with examples of relaxation techniques.   Knowledge Questionnaire Score:     Knowledge Questionnaire Score - 04/12/16 1157      Knowledge Questionnaire Score   Pre Score 6/10       Core Components/Risk Factors/Patient Goals at Admission:     Personal Goals and Risk Factors at Admission - 04/12/16 1201      Core Components/Risk Factors/Patient Goals on Admission    Weight Management Yes;Weight Loss   Intervention Weight  Management: Provide education and appropriate resources to help participant work on and attain dietary goals.   Admit Weight 223 lb (101.2 kg)   Goal Weight: Short Term 198 lb (89.8 kg)   Goal Weight: Long Term 180 lb (81.6 kg)   Expected Outcomes Short Term: Continue to assess and modify interventions until short term weight is achieved;Long Term: Adherence to nutrition and physical activity/exercise program aimed toward attainment of established weight goal;Weight Loss: Understanding of general recommendations for a balanced deficit meal plan, which promotes 1-2 lb weight loss per week and includes a negative energy balance of [phone] kcal/d;Understanding recommendations for meals to include 15-35% energy as protein, 25-35% energy from fat, 35-60% energy from carbohydrates, less than 221m of dietary cholesterol, 20-35 gm of total fiber daily;Understanding of distribution of calorie intake throughout the day with the consumption of 4-5 meals/snacks   Sedentary Yes  Mr DSzymborskihas been walking 1.970mes several days/week. He also has a gyBuilding services engineero [place]  Intervention Provide advice, education, support and counseling about physical activity/exercise needs.;Develop an individualized exercise prescription for aerobic and resistive training based on initial evaluation findings, risk stratification, comorbidities and participant's personal goals.   Expected Outcomes Achievement of increased cardiorespiratory fitness and enhanced flexibility, muscular endurance and strength shown through measurements of functional capacity and personal statement of participant.   Increase Strength and Stamina Yes   Intervention Provide advice, education, support and counseling about physical activity/exercise needs.;Develop an individualized exercise prescription for aerobic and resistive training based on initial evaluation findings, risk stratification, comorbidities and participant's personal goals.   Expected  Outcomes Achievement of increased cardiorespiratory fitness and enhanced flexibility, muscular endurance and strength shown through measurements of functional capacity and personal statement of participant.   Improve shortness of breath with ADL's Yes   Intervention Provide education, individualized exercise plan and daily activity instruction to help decrease symptoms of SOB with activities of daily living.   Expected Outcomes Short Term: Achieves a reduction of symptoms when performing activities of daily living.   Develop more efficient breathing techniques such as purse lipped breathing and diaphragmatic breathing; and practicing self-pacing with activity Yes   Intervention Provide education, demonstration and support about specific breathing techniuqes utilized for more efficient breathing. Include techniques such as pursed lipped breathing, diaphragmatic breathing and self-pacing activity.   Expected Outcomes Short Term: Participant will be able to demonstrate and use breathing techniques as needed throughout daily activities.   Increase knowledge of respiratory medications and ability to use respiratory devices properly  Yes  Symbicort, ProAir, Spiriva, Spacer given with instructions; Theophylline   Intervention Provide education and demonstration  as needed of appropriate use of medications, inhalers, and oxygen therapy.   Expected Outcomes Short Term: Achieves understanding of medications use. Understands that oxygen is a medication prescribed by physician. Demonstrates appropriate use of inhaler and oxygen therapy.   Hypertension Yes   Intervention Provide education on lifestyle modifcations including regular physical activity/exercise, weight management, moderate sodium restriction and increased consumption of fresh fruit, vegetables, and low fat dairy, alcohol moderation, and smoking cessation.;Monitor prescription use compliance.   Expected Outcomes Short Term: Continued assessment and  intervention until BP is < 140/69m HG in hypertensive participants. < 130/87mHG in hypertensive participants with diabetes, heart failure or chronic kidney disease.;Long Term: Maintenance of blood pressure at goal levels.   Lipids Yes   Intervention Provide education and support for participant on nutrition & aerobic/resistive exercise along with prescribed medications to achieve LDL <708mHDL >61m56m Expected Outcomes Short Term: Participant states understanding of desired cholesterol values and is compliant with medications prescribed. Participant is following exercise prescription and nutrition guidelines.;Long Term: Cholesterol controlled with medications as prescribed, with individualized exercise RX and with personalized nutrition plan. Value goals: LDL < 70mg20mL > 40 mg.      Core Components/Risk Factors/Patient Goals Review:      Goals and Risk Factor Review    Row Name 04/18/16 1043 04/18/16 1121           Core Components/Risk Factors/Patient Goals Review   Personal Goals Review Develop more efficient breathing techniques such as purse lipped breathing and diaphragmatic breathing and practicing self-pacing with activity. Weight Management/Obesity      Review Pursed lip breathing was discussed and demonstrated with patient. He demonstarted understanding of this breathing technique and when to use it.  Patient stated that he was interested in meeting with the dietician. He was given the paperwork and contact information to schedule an appointment.       Expected Outcomes Patient will use PLB during exercise and ADL's to help control SOB and maintian an acceptable oxygen saturation.  Patient will call and make an appointment to meet with the dietician.          Core Components/Risk Factors/Patient Goals at Discharge (Final Review):      Goals and Risk Factor Review - 04/18/16 1121      Core Components/Risk Factors/Patient Goals Review   Personal Goals Review Weight  Management/Obesity   Review Patient stated that he was interested in meeting with the dietician. He was given the paperwork and contact information to schedule an appointment.    Expected Outcomes Patient will call and make an appointment to meet with the dietician.       ITP Comments:   Comments: 30 day note review

## 2016-04-25 NOTE — Progress Notes (Signed)
Daily Session Note  Patient Details  Name: Melvin Brewer MRN: [id number] Date of Birth: [birth year] Referring Provider:    Encounter Date: 04/25/2016  Check In:     Session Check In - 04/25/16 1128      Check-In   Location [facility]   Staff Present Earlean Shawl, BS, ACSM CEP, Exercise Physiologist;Amanda Oletta Darter, BA, ACSM CEP, Exercise Physiologist;Laureen Janell Quiet, RRT, Respiratory Therapist   Supervising physician immediately available to respond to emergencies [facility] immediately available ER MD   Physician(s) Drs. McShane and Kinner   Medication changes reported     No   Fall or balance concerns reported    No   Warm-up and Cool-down Performed on first and last piece of equipment   Resistance Training Performed Yes   VAD Patient? No     Pain Assessment   Currently in Pain? No/denies   Multiple Pain Sites No         Goals Met:  Proper associated with RPD/PD & O2 Sat Independence with exercise equipment Exercise tolerated well Strength training completed today  Goals Unmet:  Not Applicable  Comments: Pt able to follow exercise prescription today without complaint.  Will continue to monitor for progression.    Dr. Emily Filbert is Medical Director for [place] and [facility].

## 2016-05-02 ENCOUNTER — Encounter: Payer: Medicare Other | Admitting: *Deleted

## 2016-05-02 DIAGNOSIS — J449 Chronic obstructive pulmonary disease, unspecified: Secondary | ICD-10-CM

## 2016-05-02 NOTE — Progress Notes (Signed)
Daily Session Note  Patient Details  Name: Melvin Brewer MRN: [id number] Date of Birth: 02/06/1951 Referring Provider:    Encounter Date: 05/02/2016  Check In:     Session Check In - 05/02/16 1148      Check-In   Location [facility]   Staff Present Nada Maclachlan, BA, ACSM CEP, Exercise Physiologist;Laureen Owens Shark, BS, RRT, Respiratory Bertis Ruddy, BS, ACSM CEP, Exercise Physiologist   Supervising physician immediately available to respond to emergencies [facility] immediately available ER MD   Physician(s) Drs. Mariea Clonts and Radcliffe   Medication changes reported     No   Fall or balance concerns reported    No   Warm-up and Cool-down Performed on first and last piece of equipment   Resistance Training Performed Yes   VAD Patient? No     Pain Assessment   Currently in Pain? No/denies   Multiple Pain Sites No         Goals Met:  Proper associated with RPD/PD & O2 Sat Independence with exercise equipment Exercise tolerated well Strength training completed today  Goals Unmet:  Not Applicable  Comments: Pt able to follow exercise prescription today without complaint.  Will continue to monitor for progression.    Dr. Emily Filbert is Medical Director for [employer] and [facility].

## 2016-05-04 DIAGNOSIS — J449 Chronic obstructive pulmonary disease, unspecified: Secondary | ICD-10-CM

## 2016-05-04 NOTE — Progress Notes (Signed)
Daily Session Note  Patient Details  Name: Melvin Brewer MRN: [id number] Date of Birth: October 26, 1950 Referring Provider:    Encounter Date: 05/04/2016  Check In:     Session Check In - 05/04/16 1201      Check-In   Location [facility]   Staff Present Alberteen Sam, MA, ACSM RCEP, Exercise Physiologist;Adell Koval Oletta Darter, BA, ACSM CEP, Exercise Physiologist;Laureen Janell Quiet, RRT, Respiratory Therapist   Supervising physician immediately available to respond to emergencies [facility] immediately available ER MD   Physician(s) Cinda Quest and Lord   Medication changes reported     No   Fall or balance concerns reported    No   Warm-up and Cool-down Performed as group-led Location manager Performed Yes   VAD Patient? No     Pain Assessment   Currently in Pain? No/denies   Multiple Pain Sites No         Goals Met:  Proper associated with RPD/PD & O2 Sat Independence with exercise equipment Exercise tolerated well Strength training completed today  Goals Unmet:  Not Applicable  Comments: Pt able to follow exercise prescription today without complaint.  Will continue to monitor for progression.    Dr. Emily Filbert is Medical Director for [place] and [facility].

## 2016-05-06 ENCOUNTER — Encounter: Payer: Medicare Other | Admitting: *Deleted

## 2016-05-06 DIAGNOSIS — J449 Chronic obstructive pulmonary disease, unspecified: Secondary | ICD-10-CM

## 2016-05-06 NOTE — Progress Notes (Signed)
Daily Session Note  Patient Details  Name: Melvin Brewer MRN: [id number] Date of Birth: 1951/01/06 Referring Provider:    Encounter Date: 05/06/2016  Check In:     Session Check In - 05/06/16 1026      Check-In   Location [facility]   Staff Present Gerlene Burdock, RN, BSN;Selah Zelman RN Vickki Hearing, BA, ACSM CEP, Exercise Physiologist   Supervising physician immediately available to respond to emergencies [facility] immediately available ER MD   Physician(s) Drs. Quentin Cornwall and Paduchowski   Medication changes reported     No   Fall or balance concerns reported    No   Warm-up and Cool-down Performed as group-led Location manager Performed Yes   VAD Patient? No     Pain Assessment   Currently in Pain? No/denies   Multiple Pain Sites No         Goals Met:  Proper associated with RPD/PD & O2 Sat Independence with exercise equipment No report of cardiac concerns or symptoms Strength training completed today  Goals Unmet:  Not Applicable  Comments: [name] was unable to complete his full exercise prescription today.  At end of second machine, he stopped exercising and stated that "this cold is getting the best of me".  He has had a "cold" for approximately 1 month and is currently on prednisone and other medications without improvement.  SpO2 sat WNL.  Pt was advised to call doctor and left early.  Will continue to monitor for progression.    Dr. Emily Filbert is Medical Director for [place] and [facility].

## 2016-05-09 ENCOUNTER — Encounter: Payer: Medicare Other | Admitting: *Deleted

## 2016-05-09 DIAGNOSIS — J449 Chronic obstructive pulmonary disease, unspecified: Secondary | ICD-10-CM

## 2016-05-09 NOTE — Progress Notes (Signed)
Daily Session Note  Patient Details  Name: Melvin Brewer MRN: [id number] Date of Birth: 1950-08-22 Referring Provider:    Encounter Date: 05/09/2016  Check In:     Session Check In - 05/09/16 1011      Check-In   Location [facility]   Staff Present Nada Maclachlan, BA, ACSM CEP, Exercise Physiologist;Laureen Owens Shark, BS, RRT, Respiratory Bertis Ruddy, BS, ACSM CEP, Exercise Physiologist   Supervising physician immediately available to respond to emergencies See telemetry face sheet for immediately available ER MD   Physician(s) Drs. Kinner and Paduchowski   Medication changes reported     No   Fall or balance concerns reported    No   Warm-up and Cool-down Performed on first and last piece of equipment   Resistance Training Performed Yes   VAD Patient? No     Pain Assessment   Currently in Pain? No/denies   Multiple Pain Sites No         Goals Met:  Proper associated with RPD/PD & O2 Sat Independence with exercise equipment Exercise tolerated well Strength training completed today  Goals Unmet:  Not Applicable  Comments: Pt able to follow exercise prescription today without complaint.  Will continue to monitor for progression.    Dr. Emily Filbert is Medical Director for [place] and [facility].

## 2016-05-11 ENCOUNTER — Encounter: Payer: Medicare Other | Admitting: *Deleted

## 2016-05-11 DIAGNOSIS — J449 Chronic obstructive pulmonary disease, unspecified: Secondary | ICD-10-CM

## 2016-05-11 NOTE — Progress Notes (Signed)
Daily Session Note  Patient Details  Name: Melvin Brewer MRN: [id number] Date of Birth: 01-Nov-1950 Referring Provider:    Encounter Date: 05/11/2016  Check In:     Session Check In - 05/11/16 1014      Check-In   Location [facility]   Staff Present Alberteen Sam, MA, ACSM RCEP, Exercise Physiologist;Other;Amanda Oletta Darter, BA, ACSM CEP, Exercise Physiologist;Laureen Janell Quiet, RRT, Respiratory Therapist   Supervising physician immediately available to respond to emergencies [facility] immediately available ER MD   Physician(s) Drs. Malinda and Williams   Medication changes reported     No   Fall or balance concerns reported    No   Warm-up and Cool-down Performed as group-led Location manager Performed Yes   VAD Patient? No     Pain Assessment   Currently in Pain? No/denies   Multiple Pain Sites No         Goals Met:  Proper associated with RPD/PD & O2 Sat Independence with exercise equipment Using PLB without cueing & demonstrates good technique Exercise tolerated well Strength training completed today  Goals Unmet:  Not Applicable  Comments: Pt able to follow exercise prescription today without complaint.  Will continue to monitor for progression.    Dr. Emily Filbert is Medical Director for [place] and [facility].

## 2016-05-11 NOTE — Progress Notes (Signed)
Dmani psychosocial assessment reveals no barriers at this time to participation in Pulmonary Rehab.  He  has good family and friend support that encourages Bentzion to participate in [place] and progress with His goals.  Alexandra concerns are monitored, but he  has acknowledge that attending the program has helped to maintain quality life with improved mobility, self-care, and emotional and financial stability.  Piero is commended for regular attendance and self-motivation to improve His pulmonary disease management.

## 2016-05-13 ENCOUNTER — Encounter: Payer: Medicare Other | Attending: Specialist | Admitting: *Deleted

## 2016-05-13 DIAGNOSIS — J449 Chronic obstructive pulmonary disease, unspecified: Secondary | ICD-10-CM | POA: Insufficient documentation

## 2016-05-13 NOTE — Progress Notes (Signed)
Daily Session Note  Patient Details  Name: Melvin Brewer MRN: [id number] Date of Birth: 1950-05-09 Referring Provider:    Encounter Date: 05/13/2016  Check In:     Session Check In - 05/13/16 1009      Check-In   Location [facility]   Staff Present Gerlene Burdock, RN, Vickki Hearing, BA, ACSM CEP, Exercise Physiologist;Patricia Surles RN BSN   Supervising physician immediately available to respond to emergencies [facility] immediately available ER MD   Physician(s) Dr. Jimmye Norman and Dr. Marcelene Butte   Medication changes reported     No   Fall or balance concerns reported    No   Warm-up and Cool-down Performed on first and last piece of equipment   Resistance Training Performed Yes   VAD Patient? No     Pain Assessment   Currently in Pain? No/denies           Exercise Prescription Changes - 05/12/16 1300      Response to Exercise   Blood Pressure (Admit) 142/70   Blood Pressure (Exercise) 198/80   Blood Pressure (Exit) 126/70   Heart Rate (Admit) 66 bpm   Heart Rate (Exercise) 115 bpm   Heart Rate (Exit) 83 bpm   Oxygen Saturation (Admit) 96 %   Oxygen Saturation (Exercise) 92 %   Oxygen Saturation (Exit) 93 %   Rating of Perceived Exertion (Exercise) 14   Perceived Dyspnea (Exercise) 3   Duration Progress to 45 minutes of aerobic exercise without signs/symptoms of physical distress   Intensity THRR unchanged     Progression   Progression Continue to progress workloads to maintain intensity without signs/symptoms of physical distress.     Resistance Training   Training Prescription Yes   Weight 3   Reps 10-15     Interval Training   Interval Training No     Treadmill   MPH 2.3   Grade 0.5   Minutes 15   METs 2.9     Elliptical   Level 1   Minutes 12  4/4/4      Goals Met:  Proper associated with RPD/PD & O2 Sat Exercise tolerated well  Goals Unmet:  Not Applicable  Comments:     Dr. Emily Filbert is Medical  Director for [place] and [facility].

## 2016-05-16 DIAGNOSIS — J449 Chronic obstructive pulmonary disease, unspecified: Secondary | ICD-10-CM

## 2016-05-16 NOTE — Progress Notes (Signed)
Daily Session Note  Patient Details  Name: Melvin Brewer MRN: [id number] Date of Birth: 07-29-1950 Referring Provider:    Encounter Date: 05/16/2016  Check In:     Session Check In - 05/16/16 1210      Check-In   Location [facility]   Staff Present Carson Myrtle, BS, RRT, Respiratory Therapist;Kelly Amedeo Plenty, BS, ACSM CEP, Exercise Physiologist;Beuna Bolding Oletta Darter, BA, ACSM CEP, Exercise Physiologist   Supervising physician immediately available to respond to emergencies [facility] immediately available ER MD   Physician(s) Jimmye Norman and Corky Downs   Medication changes reported     No   Fall or balance concerns reported    No   Warm-up and Cool-down Performed as group-led instruction   Resistance Training Performed Yes   VAD Patient? No     Pain Assessment   Currently in Pain? No/denies         Goals Met:  Proper associated with RPD/PD & O2 Sat Independence with exercise equipment Exercise tolerated well Strength training completed today  Goals Unmet:  Not Applicable  Comments: Pt able to follow exercise prescription today without complaint.  Will continue to monitor for progression.    Dr. Emily Filbert is Medical Director for [place] and [facility].

## 2016-05-18 ENCOUNTER — Encounter: Payer: Medicare Other | Admitting: *Deleted

## 2016-05-18 DIAGNOSIS — J449 Chronic obstructive pulmonary disease, unspecified: Secondary | ICD-10-CM | POA: Diagnosis not present

## 2016-05-18 NOTE — Progress Notes (Addendum)
Daily Session Note  Patient Details  Name: Melvin Brewer MRN: [id number] Date of Birth: 1951/01/15 Referring Provider:    Encounter Date: 05/18/2016  Check In:     Session Check In - 05/18/16 1004      Check-In   Location [facility]   Staff Present Alberteen Sam, MA, ACSM RCEP, Exercise Physiologist;Laureen Owens Shark, BS, RRT, Respiratory Dareen Piano, BA, ACSM CEP, Exercise Physiologist   Supervising physician immediately available to respond to emergencies [facility] immediately available ER MD   Physician(s) Drs. Malinda and Robinson   Medication changes reported     No   Fall or balance concerns reported    No   Warm-up and Cool-down Performed as group-led Location manager Performed Yes   VAD Patient? No     Pain Assessment   Currently in Pain? No/denies   Multiple Pain Sites No         Goals Met:  Proper associated with RPD/PD & O2 Sat Independence with exercise equipment Using PLB without cueing & demonstrates good technique Exercise tolerated well Strength training completed today  Goals Unmet:  Not Applicable  Comments: Pt able to follow exercise prescription today without complaint.  Will continue to monitor for progression.  Reviewed home exercise with pt today.  Pt plans to use [place] and walk at home for exercise.  Reviewed THR, pulse, RPE, sign and symptoms, NTG use, and when to call 911 or MD.  Also discussed weather considerations and indoor options.  Pt voiced understanding.   Dr. Emily Filbert is Medical Director for [place] and [facility].

## 2016-05-20 DIAGNOSIS — J449 Chronic obstructive pulmonary disease, unspecified: Secondary | ICD-10-CM | POA: Diagnosis not present

## 2016-05-20 NOTE — Progress Notes (Signed)
Daily Session Note  Patient Details  Name: FILIPE GREATHOUSE MRN: [id number] Date of Birth: Jul 30, 1950 Referring Provider:    Encounter Date: 05/20/2016  Check In:     Session Check In - 05/20/16 1021      Check-In   Location [facility]   Staff Present Levell July RN Vickki Hearing, BA, ACSM CEP, Exercise Physiologist;Carroll Enterkin, RN, BSN   Supervising physician immediately available to respond to emergencies [facility] immediately available ER MD   Physician(s) Drs. Schaevitz and Williams   Medication changes reported     No   Fall or balance concerns reported    No   Warm-up and Cool-down Performed as group-led Location manager Performed Yes   VAD Patient? No     Pain Assessment   Currently in Pain? No/denies   Multiple Pain Sites No         Goals Met:  Proper associated with RPD/PD & O2 Sat Independence with exercise equipment Exercise tolerated well No report of cardiac concerns or symptoms Strength training completed today  Goals Unmet:  Not Applicable  Comment: Pt able to follow exercise prescription today, however, became more short of breath with some mild wheezing on the last machine; O2 sats within normal limits.  Kirt sat down and used his home Proair inhaler and felt better.  He continued cooling down and completed the class with stretching exercises.  Will continue to monitor for progression.  Dr. Emily Filbert is Medical Director for [place] and [facility].

## 2016-05-23 ENCOUNTER — Encounter: Payer: Self-pay | Admitting: Respiratory Therapy

## 2016-05-23 DIAGNOSIS — J449 Chronic obstructive pulmonary disease, unspecified: Secondary | ICD-10-CM | POA: Diagnosis not present

## 2016-05-23 NOTE — Progress Notes (Signed)
Pulmonary Individual Treatment Plan  Patient Details  Name: Melvin Brewer MRN: [id number] Date of Birth: [birth year] Referring Provider:    Initial Encounter Date:  Flowsheet Row Pulmonary Rehab from 04/12/2016 in [facility] Cardiac and Pulmonary Rehab  Date  04/12/16      Visit Diagnosis: COPD, moderate ([place])  Patient's Home Medications on Admission:  Current Outpatient Prescriptions:    albuterol (PROVENTIL HFA;VENTOLIN HFA) 108 (90 BASE) MCG/ACT inhaler, Inhale 2 puffs into the lungs every 6 (six) hours as needed for wheezing or shortness of breath., Disp: , Rfl:    amitriptyline (ELAVIL) 50 MG tablet, Take 50 mg by mouth at bedtime., Disp: , Rfl:    aspirin 325 MG EC tablet, Take 325 mg by mouth daily., Disp: , Rfl:    atorvastatin (LIPITOR) 80 MG tablet, Take 80 mg by mouth daily., Disp: , Rfl:    diazepam (VALIUM) 5 MG tablet, Take 1 tablet (5 mg total) by mouth every 6 (six) hours as needed for muscle spasms., Disp: 50 tablet, Rfl: 0   docusate sodium 100 MG CAPS, Take 100 mg by mouth 2 (two) times daily., Disp: 60 capsule, Rfl: 0   fluticasone (FLONASE) 50 MCG/ACT nasal spray, Place 1 spray into both nostrils daily., Disp: , Rfl:    gabapentin (NEURONTIN) 300 MG capsule, Take 300 mg by mouth 3 (three) times daily., Disp: , Rfl:    metoprolol (LOPRESSOR) 50 MG tablet, Take 50 mg by mouth 2 (two) times daily., Disp: , Rfl:    Olodaterol HCl (STRIVERDI RESPIMAT) 2.5 MCG/ACT AERS, Inhale 1 puff into the lungs 2 (two) times daily., Disp: , Rfl:    oxyCODONE-acetaminophen (PERCOCET/ROXICET) 5-325 MG per tablet, Take 1-2 tablets by mouth every 4 (four) hours as needed for moderate pain., Disp: 100 tablet, Rfl: 0   theophylline (THEODUR) 200 MG 12 hr tablet, Take 200 mg by mouth 2 (two) times daily., Disp: , Rfl:    tiotropium (SPIRIVA) 18 MCG inhalation capsule, Place 18 mcg into inhaler and inhale daily., Disp: , Rfl:   Past Medical History: Past Medical History:   Diagnosis Date   COPD (chronic obstructive pulmonary disease)    Coronary artery disease    H/O hiatal hernia    AGE 19 S  NO MEDS   Shortness of breath    W/ EXERTION     Tobacco Use: History  Smoking Status   Former Smoker  Smokeless Tobacco   Not on file    Labs: Recent Review Flowsheet Data    There is no flowsheet data to display.       ADL UCSD:     Pulmonary Assessment Scores    Row Name 04/12/16 1159         ADL UCSD   ADL Phase Entry     SOB Score total 9     Rest 0     Walk 0     Stairs 2     Bath 1     Dress 1     Shop 0        Pulmonary Function Assessment:     Pulmonary Function Assessment - 04/12/16 1157      Initial Spirometry Results   FVC% 31 %   FEV1% 28 %   FEV1/FVC Ratio 65.82   Comments Test date - 04/12/16     Post Bronchodilator Spirometry Results   FVC% 29 %   FEV1% 24 %   FEV1/FVC Ratio 68.23     Breath  Bilateral Breath Sounds Clear;Decreased   Shortness of Breath Yes;Limiting activity      Exercise Target Goals:    Exercise Program Goal: Individual exercise prescription set with THRR, safety & activity barriers. Participant demonstrates ability to understand and report RPE using BORG scale, to self-measure pulse accurately, and to acknowledge the importance of the exercise prescription.  Exercise Prescription Goal: Starting with aerobic activity 30 plus minutes a day, 3 days per week for initial exercise prescription. Provide home exercise prescription and guidelines that participant acknowledges understanding prior to discharge.  Activity Barriers & Risk Stratification:     Activity Barriers & Cardiac Risk Stratification - 04/12/16 1157      Activity Barriers & Cardiac Risk Stratification   Activity Barriers Deconditioning;Muscular Weakness;Shortness of Breath;Arthritis   Cardiac Risk Stratification Moderate      6 Minute Walk:     6 Minute Walk    Row Name 04/12/16 1246         6 Minute  Walk   Distance 1340 feet     Walk Time 6 minutes     # of Rest Breaks 0     MPH 2.54     METS 2.91     RPE 11     Perceived Dyspnea  2     VO2 Peak 9.46     Symptoms No     Resting HR 73 bpm     Resting BP 130/68     Max Ex. HR 84 bpm     Max Ex. BP 138/68       Interval HR   Baseline HR 73     1 Minute HR 77     2 Minute HR 82     3 Minute HR 83     4 Minute HR 84     5 Minute HR 84     6 Minute HR 84     Interval Heart Rate? Yes       Interval Oxygen   Interval Oxygen? Yes     Baseline Oxygen Saturation % 93 %     1 Minute Oxygen Saturation % 95 %     2 Minute Oxygen Saturation % 92 %     3 Minute Oxygen Saturation % 94 %     4 Minute Oxygen Saturation % 96 %     5 Minute Oxygen Saturation % 95 %     6 Minute Oxygen Saturation % 97 %        Initial Exercise Prescription:     Initial Exercise Prescription - 04/12/16 1200      Date of Initial Exercise RX and Referring Provider   Date 04/12/16     Treadmill   MPH 2.3   Grade 0.5   Minutes 15   METs 2.9     Recumbant Bike   Level 3   RPM 60   Minutes 15   METs 2.9     Elliptical   Level 1   Speed 2.5   Minutes 15  3/3/3   METs 2.9     REL-XR   Level 4   Minutes 15   METs 2.9     T5 Nustep   Level 3   Minutes 15   METs 2.9     Prescription Details   Frequency (times per week) 3   Duration Progress to 45 minutes of aerobic exercise without signs/symptoms of physical distress     Intensity   THRR 40-80% of Max Heartrate  105-137   Ratings of Perceived Exertion 11-15   Perceived Dyspnea 2-4     Progression   Progression Continue to progress workloads to maintain intensity without signs/symptoms of physical distress.     Resistance Training   Training Prescription Yes   Weight 3   Reps 10-15      Perform Capillary Blood Glucose checks as needed.  Exercise Prescription Changes:     Exercise Prescription Changes    Row Name 04/18/16 1100 04/26/16 1600 05/12/16 1300 05/18/16  1100       Response to Exercise   Blood Pressure (Admit) 120/70 122/80 142/70  --    Blood Pressure (Exercise) 148/84 178/88 198/80  --    Blood Pressure (Exit) 114/68 110/72 126/70  --    Heart Rate (Admit) 67 bpm 72 bpm 66 bpm  --    Heart Rate (Exercise) 111 bpm 123 bpm 115 bpm  --    Heart Rate (Exit) 90 bpm 80 bpm 83 bpm  --    Oxygen Saturation (Admit) 94 % 94 % 96 %  --    Oxygen Saturation (Exercise) 91 % 92 % 92 %  --    Oxygen Saturation (Exit) 92 % 94 % 93 %  --    Rating of Perceived Exertion (Exercise) _0 --    Perceived Dyspnea (Exercise) _1 --    Comments  --  --  -- Home Exercise Guidelines given 05/18/16    Duration Progress to 45 minutes of aerobic exercise without signs/symptoms of physical distress Progress to 45 minutes of aerobic exercise without signs/symptoms of physical distress Progress to 45 minutes of aerobic exercise without signs/symptoms of physical distress Progress to 45 minutes of aerobic exercise without signs/symptoms of physical distress    Intensity THRR unchanged THRR unchanged THRR unchanged THRR unchanged      Progression   Progression Continue to progress workloads to maintain intensity without signs/symptoms of physical distress. Continue to progress workloads to maintain intensity without signs/symptoms of physical distress. Continue to progress workloads to maintain intensity without signs/symptoms of physical distress. Continue to progress workloads to maintain intensity without signs/symptoms of physical distress.      Resistance Training   Training Prescription Yes Yes Yes Yes    Weight _2 Reps 10-15 10-15 10-15 10-15      Interval Training   Interval Training No No No No      Treadmill   MPH 2.3 2.3 2.3 2.3    Grade 0.5 0.5 0.5 0.5    Minutes _3 METs 2.9 2.9 2.9 2.9      Recumbant Bike   Level 3  --  --  --    RPM 50  --  --  --    Minutes 15  --  --  --    METs 2.5  --  --  --      Elliptical    Level _4 Speed  -- 3.5  --  --    Minutes 10  1/1/1  -- 12  4/4/4 12  4/4/4      Home Exercise Plan   Plans to continue exercise at  --  --  -- Home  walking and [employer]    Frequency  --  --  -- Add 2 additional days to program exercise sessions.       Exercise Comments:  Exercise Comments    Row Name 04/18/16 1045 04/26/16 1642 05/12/16 1320 05/18/16 1155     Exercise Comments First full day of exercise!  Patient was oriented to gym and equipment including functions, settings, policies, and procedures.  Patient's individual exercise prescription and treatment plan were reviewed.  All starting workloads were established based on the results of the 6 minute walk test done at initial orientation visit.  The plan for exercise progression was also introduced and progression will be customized based on patient's performance and goals. Melvin Brewer is progressing well with exercise. Melvin Brewer is able to do a greater length of time and greater total time on the elliptical. Reviewed home exercise with pt today.  Pt plans to use [employer] and walk at home for exercise.  Reviewed THR, pulse, RPE, sign and symptoms, NTG use, and when to call 911 or MD.  Also discussed weather considerations and indoor options.  Pt voiced understanding.       Discharge Exercise Prescription (Final Exercise Prescription Changes):     Exercise Prescription Changes - 05/18/16 1100      Response to Exercise   Comments Home Exercise Guidelines given 05/18/16   Duration Progress to 45 minutes of aerobic exercise without signs/symptoms of physical distress   Intensity THRR unchanged     Progression   Progression Continue to progress workloads to maintain intensity without signs/symptoms of physical distress.     Resistance Training   Training Prescription Yes   Weight 3   Reps 10-15     Interval Training   Interval Training No     Treadmill   MPH 2.3   Grade 0.5   Minutes 15   METs 2.9     Elliptical    Level 1   Minutes 12  4/4/4     Home Exercise Plan   Plans to continue exercise at Home  walking and [employer]   Frequency Add 2 additional days to program exercise sessions.       Nutrition:  Target Goals: Understanding of nutrition guidelines, daily intake of sodium <1542m, cholesterol <2021m calories 30% from fat and 7% or less from saturated fats, daily to have 5 or more servings of fruits and vegetables.  Biometrics:     Pre Biometrics - 04/12/16 1245      Pre Biometrics   Height _0  (1.778 m)   Weight 219 lb 6.4 oz (99.5 kg)   Waist Circumference 48.5 inches   Hip Circumference 45 inches   Waist to Hip Ratio 1.08 %   BMI (Calculated) 31.5       Nutrition Therapy Plan and Nutrition Goals:   Nutrition Discharge: Rate Your Plate Scores:   Psychosocial: Target Goals: Acknowledge presence or absence of depression, maximize coping skills, provide positive support system. Participant is able to verbalize types and ability to use techniques and skills needed for reducing stress and depression.  Initial Review & Psychosocial Screening:     Initial Psych Review & Screening - 04/12/16 [place]   Comments Melvin Brewer good support from his wife and son. He has his Ch[place]aith and no depression. Melvin Brewer looking forward to participating in [place] back to exercise and weight loss.     Barriers   Psychosocial barriers to participate in program The patient should benefit from training in stress management and relaxation.     Screening Interventions   Interventions Encouraged to exercise  Quality of Life Scores:     Quality of Life - 04/12/16 1206      Quality of Life Scores   Health/Function Pre 20.73 %   Socioeconomic Pre 20.29 %   Psych/Spiritual Pre 21 %   Family Pre 21 %   GLOBAL Pre 20.74 %      PHQ-9: Recent Review Flowsheet Data    Depression screen [facility] 2/9 04/12/2016    Decreased Interest 0   Down, Depressed, Hopeless 0   PHQ - 2 Score 0   Altered sleeping 1   Tired, decreased energy 0   Change in appetite 1   Feeling bad or failure about yourself  0   Trouble concentrating 0   Moving slowly or fidgety/restless 0   Suicidal thoughts 0   PHQ-9 Score 2   Difficult doing work/chores Not difficult at all      Psychosocial Evaluation and Intervention:     Psychosocial Evaluation - 04/20/16 1109      Psychosocial Evaluation & Interventions   Interventions Encouraged to exercise with the program and follow exercise prescription   Comments Counselor met with Melvin Brewer today for initial psychosocial evaluation.  He is a 66 year old that was diagnosed with COPD 8-10 years ago.  He has a stron support system with a spouse of 108 years and active involvement in his local church.  Melvin Brewer sleeps well and has a good appetite.  He denies a history of depression or anxiety or any current symptoms; and reports he is typically in a positive mood most of the time.  He has minimal stress in his life at this time.  Melvin Brewer has goals to be educated on the limits of exercising with this diagnosis; and just to breathe better in general.  Staff will follow with Melvin Brewer throughout the course of this program.        Psychosocial Re-Evaluation:     Psychosocial Re-Evaluation    [place] Name 05/11/16 1542             Psychosocial Re-Evaluation   Comments Melvin Brewer psychosocial assessment reveals no barriers at this time to participation in Pulmonary Rehab.  He  has good family and friend support that encourages Melvin Brewer to participate in [place] and progress with His goals.  [place] concerns are monitored, but he  has acknowledge that attending the program has helped to maintain quality life with improved mobility, self-care, and emotional and financial stability.  Melvin Brewer is commended for regular attendance and self-motivation to improve His pulmonary disease management.          Education: Education Goals: Education classes will be provided on a weekly basis, covering required topics. Participant will state understanding/return demonstration of topics presented.  Learning Barriers/Preferences:     Learning Barriers/Preferences - 04/12/16 1157      Learning Barriers/Preferences   Learning Barriers None   Learning Preferences None      Education Topics: Initial Evaluation Education: - Verbal, written and demonstration of respiratory meds, RPE/PD scales, oximetry and breathing techniques. Instruction on use of nebulizers and MDIs: cleaning and proper use, rinsing mouth with steroid doses and importance of monitoring MDI activations. Flowsheet Row Pulmonary Rehab from 05/18/2016 in [facility] Cardiac and Pulmonary Rehab  Date  04/12/16  Educator  LB  Instruction Review Code  2- meets goals/outcomes      General Nutrition Guidelines/Fats and Fiber: -Group instruction provided by verbal, written material, models and posters to present the general guidelines for heart healthy  nutrition. Gives an explanation and review of dietary fats and fiber. Flowsheet Row Pulmonary Rehab from 05/18/2016 in [facility] Cardiac and Pulmonary Rehab  Date  04/25/16  Educator  CR  Instruction Review Code  2- meets goals/outcomes      Controlling Sodium/Reading Food Labels: -Group verbal and written material supporting the discussion of sodium use in heart healthy nutrition. Review and explanation with models, verbal and written materials for utilization of the food label. Flowsheet Row Pulmonary Rehab from 05/18/2016 in [facility] Cardiac and Pulmonary Rehab  Date  05/02/16  Educator  CR  Instruction Review Code  2- meets goals/outcomes      Exercise Physiology & Risk Factors: - Group verbal and written instruction with models to review the exercise physiology of the cardiovascular system and associated critical values. Details cardiovascular disease risk factors and the goals associated with  each risk factor.   Aerobic Exercise & Resistance Training: - Gives group verbal and written discussion on the health impact of inactivity. On the components of aerobic and resistive training programs and the benefits of this training and how to safely progress through these programs. Flowsheet Row Pulmonary Rehab from 05/18/2016 in [facility] Cardiac and Pulmonary Rehab  Date  05/11/16  Educator  [facility]  Instruction Review Code  2- meets goals/outcomes      Flexibility, Balance, General Exercise Guidelines: - Provides group verbal and written instruction on the benefits of flexibility and balance training programs. Provides general exercise guidelines with specific guidelines to those with heart or lung disease. Demonstration and skill practice provided.   Stress Management: - Provides group verbal and written instruction about the health risks of elevated stress, cause of high stress, and healthy ways to reduce stress.   Depression: - Provides group verbal and written instruction on the correlation between heart/lung disease and depressed mood, treatment options, and the stigmas associated with seeking treatment.   Exercise & Equipment Safety: - Individual verbal instruction and demonstration of equipment use and safety with use of the equipment. Flowsheet Row Pulmonary Rehab from 05/18/2016 in [facility] Cardiac and Pulmonary Rehab  Date  04/18/16  Educator  [facility]  Instruction Review Code  2- meets goals/outcomes      Infection Prevention: - Provides verbal and written material to individual with discussion of infection control including proper hand washing and proper equipment cleaning during exercise session. Flowsheet Row Pulmonary Rehab from 05/18/2016 in [facility] Cardiac and Pulmonary Rehab  Date  04/18/16  Educator  [facility]  Instruction Review Code  2- meets goals/outcomes      Falls Prevention: - Provides verbal and written material to individual with discussion of falls prevention and  safety. Flowsheet Row Pulmonary Rehab from 05/18/2016 in [facility] Cardiac and Pulmonary Rehab  Date  04/12/16  Educator  LB  Instruction Review Code  2- meets goals/outcomes      Diabetes: - Individual verbal and written instruction to review signs/symptoms of diabetes, desired ranges of glucose level fasting, after meals and with exercise. Advice that pre and post exercise glucose checks will be done for 3 sessions at entry of program.   Chronic Lung Diseases: - Group verbal and written instruction to review new updates, new respiratory medications, new advancements in procedures and treatments. Provide informative websites and "800" numbers of self-education. Flowsheet Row Pulmonary Rehab from 05/18/2016 in [facility] Cardiac and Pulmonary Rehab  Date  05/04/16  Educator  LB  Instruction Review Code  2- meets goals/outcomes      Lung Procedures: - Group verbal and  written instruction to describe testing methods done to diagnose lung disease. Review the outcome of test results. Describe the treatment choices: Pulmonary Function Tests, ABGs and oximetry.   Energy Conservation: - Provide group verbal and written instruction for methods to conserve energy, plan and organize activities. Instruct on pacing techniques, use of adaptive equipment and posture/positioning to relieve shortness of breath.   Triggers: - Group verbal and written instruction to review types of environmental controls: home humidity, furnaces, filters, dust mite/pet prevention, HEPA vacuums. To discuss weather changes, air quality and the benefits of nasal washing.   Exacerbations: - Group verbal and written instruction to provide: warning signs, infection symptoms, calling MD promptly, preventive modes, and value of vaccinations. Review: effective airway clearance, coughing and/or vibration techniques. Create an Sports administrator.   Oxygen: - Individual and group verbal and written instruction on oxygen therapy. Includes  supplement oxygen, available portable oxygen systems, continuous and intermittent flow rates, oxygen safety, concentrators, and Medicare reimbursement for oxygen.   Respiratory Medications: - Group verbal and written instruction to review medications for lung disease. Drug class, frequency, complications, importance of spacers, rinsing mouth after steroid MDI's, and proper cleaning methods for nebulizers. Flowsheet Row Pulmonary Rehab from 05/18/2016 in [facility] Cardiac and Pulmonary Rehab  Date  04/12/16  Educator  LB  Instruction Review Code  2- meets goals/outcomes      AED/CPR: - Group verbal and written instruction with the use of models to demonstrate the basic use of the AED with the basic ABC's of resuscitation.   Breathing Retraining: - Provides individuals verbal and written instruction on purpose, frequency, and proper technique of diaphragmatic breathing and pursed-lipped breathing. Applies individual practice skills. Flowsheet Row Pulmonary Rehab from 05/18/2016 in [facility] Cardiac and Pulmonary Rehab  Date  04/18/16  Educator  [facility]  Instruction Review Code  2- meets goals/outcomes      Anatomy and Physiology of the Lungs: - Group verbal and written instruction with the use of models to provide basic lung anatomy and physiology related to function, structure and complications of lung disease.   Heart Failure: - Group verbal and written instruction on the basics of heart failure: signs/symptoms, treatments, explanation of ejection fraction, enlarged heart and cardiomyopathy.   Sleep Apnea: - Individual verbal and written instruction to review Obstructive Sleep Apnea. Review of risk factors, methods for diagnosing and types of masks and machines for OSA.   Anxiety: - Provides group, verbal and written instruction on the correlation between heart/lung disease and anxiety, treatment options, and management of anxiety. Flowsheet Row Pulmonary Rehab from 05/18/2016 in [facility] Cardiac and  Pulmonary Rehab  Date  04/20/16  Educator  [facility]  Instruction Review Code  2- Meets goals/outcomes      Relaxation: - Provides group, verbal and written instruction about the benefits of relaxation for patients with heart/lung disease. Also provides patients with examples of relaxation techniques. Flowsheet Row Pulmonary Rehab from 05/18/2016 in [facility] Cardiac and Pulmonary Rehab  Date  05/18/16  Educator  [facility]  Instruction Review Code  2- Meets goals/outcomes      Knowledge Questionnaire Score:     Knowledge Questionnaire Score - 04/12/16 1157      Knowledge Questionnaire Score   Pre Score 6/10       Core Components/Risk Factors/Patient Goals at Admission:     Personal Goals and Risk Factors at Admission - 04/12/16 1201      Core Components/Risk Factors/Patient Goals on Admission    Weight Management Yes;Weight Loss  Intervention Weight Management: Provide education and appropriate resources to help participant work on and attain dietary goals.   Admit Weight 223 lb (101.2 kg)   Goal Weight: Short Term 198 lb (89.8 kg)   Goal Weight: Long Term 180 lb (81.6 kg)   Expected Outcomes Short Term: Continue to assess and modify interventions until short term weight is achieved;Long Term: Adherence to nutrition and physical activity/exercise program aimed toward attainment of established weight goal;Weight Loss: Understanding of general recommendations for a balanced deficit meal plan, which promotes 1-2 lb weight loss per week and includes a negative energy balance of [phone] kcal/Brewer;Understanding recommendations for meals to include 15-35% energy as protein, 25-35% energy from fat, 35-60% energy from carbohydrates, less than 246m of dietary cholesterol, 20-35 gm of total fiber daily;Understanding of distribution of calorie intake throughout the day with the consumption of 4-5 meals/snacks   Sedentary Yes  Melvin Brewer been walking 1.968mes several days/week. He also has a gyGarment/textile technologisto [employer]  Intervention Provide advice, education, support and counseling about physical activity/exercise needs.;Develop an individualized exercise prescription for aerobic and resistive training based on initial evaluation findings, risk stratification, comorbidities and participant's personal goals.   Expected Outcomes Achievement of increased cardiorespiratory fitness and enhanced flexibility, muscular endurance and strength shown through measurements of functional capacity and personal statement of participant.   Increase Strength and Stamina Yes   Intervention Provide advice, education, support and counseling about physical activity/exercise needs.;Develop an individualized exercise prescription for aerobic and resistive training based on initial evaluation findings, risk stratification, comorbidities and participant's personal goals.   Expected Outcomes Achievement of increased cardiorespiratory fitness and enhanced flexibility, muscular endurance and strength shown through measurements of functional capacity and personal statement of participant.   Improve shortness of breath with ADL's Yes   Intervention Provide education, individualized exercise plan and daily activity instruction to help decrease symptoms of SOB with activities of daily living.   Expected Outcomes Short Term: Achieves a reduction of symptoms when performing activities of daily living.   Develop more efficient breathing techniques such as purse lipped breathing and diaphragmatic breathing; and practicing self-pacing with activity Yes   Intervention Provide education, demonstration and support about specific breathing techniuqes utilized for more efficient breathing. Include techniques such as pursed lipped breathing, diaphragmatic breathing and self-pacing activity.   Expected Outcomes Short Term: Participant will be able to demonstrate and use breathing techniques as needed throughout daily activities.    Increase knowledge of respiratory medications and ability to use respiratory devices properly  Yes  Symbicort, ProAir, Spiriva, Spacer given with instructions; Theophylline   Intervention Provide education and demonstration as needed of appropriate use of medications, inhalers, and oxygen therapy.   Expected Outcomes Short Term: Achieves understanding of medications use. Understands that oxygen is a medication prescribed by physician. Demonstrates appropriate use of inhaler and oxygen therapy.   Hypertension Yes   Intervention Provide education on lifestyle modifcations including regular physical activity/exercise, weight management, moderate sodium restriction and increased consumption of fresh fruit, vegetables, and low fat dairy, alcohol moderation, and smoking cessation.;Monitor prescription use compliance.   Expected Outcomes Short Term: Continued assessment and intervention until BP is < 140/9095mG in hypertensive participants. < 130/41m63m in hypertensive participants with diabetes, heart failure or chronic kidney disease.;Long Term: Maintenance of blood pressure at goal levels.   Lipids Yes   Intervention Provide education and support for participant on nutrition & aerobic/resistive exercise along with prescribed medications to achieve LDL <70mg79mL >  59m.   Expected Outcomes Short Term: Participant states understanding of desired cholesterol values and is compliant with medications prescribed. Participant is following exercise prescription and nutrition guidelines.;Long Term: Cholesterol controlled with medications as prescribed, with individualized exercise RX and with personalized nutrition plan. Value goals: LDL < 713m HDL > 40 mg.      Core Components/Risk Factors/Patient Goals Review:      Goals and Risk Factor Review    Row Name 04/18/16 1043 04/18/16 1121 05/06/16 1139         Core Components/Risk Factors/Patient Goals Review   Personal Goals Review Develop more efficient  breathing techniques such as purse lipped breathing and diaphragmatic breathing and practicing self-pacing with activity. Weight Management/Obesity Weight Management/Obesity;Sedentary;Increase Strength and Stamina;Other     Review Pursed lip breathing was discussed and demonstrated with patient. He demonstarted understanding of this breathing technique and when to use it.  Patient stated that he was interested in meeting with the dietician. He was given the paperwork and contact information to schedule an appointment.  Melvin Brewer had a cold and is finishing up a Zpac and prednisone.  Even with this, he has imporved his time on Elliptical from 1 - 3 1/2 minutes, and overall is down 11 1/2 lb.     Expected Outcomes Patient will use PLB during exercise and ADL's to help control SOB and maintian an acceptable oxygen saturation.  Patient will call and make an appointment to meet with the dietician.  Melvin Brewer continue regular exercise and see improvement in his stamina, and will continue to be successful at weight loss.        Core Components/Risk Factors/Patient Goals at Discharge (Final Review):      Goals and Risk Factor Review - 05/06/16 1139      Core Components/Risk Factors/Patient Goals Review   Personal Goals Review Weight Management/Obesity;Sedentary;Increase Strength and Stamina;Other   Review Melvin Brewer had a cold and is finishing up a Zpac and prednisone.  Even with this, he has imporved his time on Elliptical from 1 - 3 1/2 minutes, and overall is down 11 1/2 lb.   Expected Outcomes Melvin Brewer continue regular exercise and see improvement in his stamina, and will continue to be successful at weight loss.      ITP Comments:     ITP Comments    Row Name 05/06/16 1105 05/11/16 1136         ITP Comments Melvin. DiSanagustinas unable to complete his full exercise prescription today.  At end of second machine, he stopped exercising and stated that "this cold is getting the best of me".  He  has had a "cold" for approximately 1 month and is currently on prednisone and other medications without improvement.  SpO2 sat WNL.  Pt was advised to call doctor and left early.  Will continue to monitor for progression. Melvin. DiDocterad his 30 face to face with Dr. MiSabra Heckoday.         Comments: 30 Day Note Review

## 2016-05-23 NOTE — Progress Notes (Signed)
Daily Session Note  Patient Details  Name: Melvin Brewer MRN: [id number] Date of Birth: [birth year] Referring Provider:    Encounter Date: 05/23/2016  Check In:     Session Check In - 05/23/16 1109      Check-In   Location [facility]   Staff Present Nyoka Cowden, RN, BSN, [place], BS, RRT, Respiratory Dareen Piano, BA, ACSM CEP, Exercise Physiologist   Supervising physician immediately available to respond to emergencies [facility] immediately available ER MD   Physician(s) Legrand Rams and Mariea Clonts   Medication changes reported     No   Fall or balance concerns reported    No   Warm-up and Cool-down Performed as group-led instruction   Resistance Training Performed Yes   VAD Patient? No     Pain Assessment   Currently in Pain? No/denies   Multiple Pain Sites No         Goals Met:  Proper associated with RPD/PD & O2 Sat Independence with exercise equipment Exercise tolerated well Strength training completed today  Goals Unmet:  Not Applicable  Comments: Pt able to follow exercise prescription today without complaint.  Will continue to monitor for progression.   Dr. Emily Filbert is Medical Director for [place] and [facility].

## 2016-05-25 DIAGNOSIS — J449 Chronic obstructive pulmonary disease, unspecified: Secondary | ICD-10-CM

## 2016-05-25 NOTE — Progress Notes (Signed)
Daily Session Note  Patient Details  Name: Melvin Brewer MRN: [id number] Date of Birth: [birth year] Referring Provider:    Encounter Date: 05/25/2016  Check In:     Session Check In - 05/25/16 1136      Check-In   Location [facility]   Staff Present Carson Myrtle, BS, RRT, Respiratory Lennie Hummer, MA, ACSM RCEP, Exercise Physiologist;Dejon Jungman Oletta Darter, BA, ACSM CEP, Exercise Physiologist   Supervising physician immediately available to respond to emergencies [facility] immediately available ER MD   Physician(s) Cinda Quest and [place]   Medication changes reported     No   Fall or balance concerns reported    No   Warm-up and Cool-down Performed as group-led instruction   Resistance Training Performed Yes   VAD Patient? No     Pain Assessment   Currently in Pain? No/denies   Multiple Pain Sites No         Goals Met:  Proper associated with RPD/PD & O2 Sat Independence with exercise equipment Exercise tolerated well Strength training completed today  Goals Unmet:  Not Applicable  Comments:      [place] Name 04/12/16 1246 05/25/16 1137       6 Minute Walk   Phase  - Mid Program    Distance 1340 feet 1516 feet    Distance % Change  - 13 %    Walk Time 6 minutes 6 minutes    # of Rest Breaks 0 0    MPH 2.54 2.87    METS 2.91 4.01    RPE 11 13    Perceived Dyspnea  2 2    VO2 Peak 9.46 14.03    Symptoms No No    Resting HR 73 bpm 68 bpm    Resting BP 130/68 112/64    Max Ex. HR 84 bpm 104 bpm    Max Ex. BP 138/68 144/76      Interval HR   Baseline HR 73 68    1 Minute HR 77 93    2 Minute HR 82 95    3 Minute HR 83  -    4 Minute HR 84 96    5 Minute HR 84 98    6 Minute HR 84 104    2 Minute Post HR  - 86    Interval Heart Rate? Yes  -      Interval Oxygen   Interval Oxygen? Yes  -    Baseline Oxygen Saturation % 93 % 96 %    1 Minute Oxygen Saturation % 95 % 95 %    2 Minute Oxygen Saturation % 92 %  94 %    3 Minute Oxygen Saturation % 94 % 94 %    4 Minute Oxygen Saturation % 96 % 95 %    5 Minute Oxygen Saturation % 95 % 94 %    6 Minute Oxygen Saturation % 97 % 93 %    2 Minute Post Oxygen Saturation %  - 96 %         Dr. Emily Filbert is Medical Director for [place] and [facility].

## 2016-05-27 ENCOUNTER — Encounter: Payer: Medicare Other | Admitting: *Deleted

## 2016-05-27 DIAGNOSIS — J449 Chronic obstructive pulmonary disease, unspecified: Secondary | ICD-10-CM

## 2016-05-27 NOTE — Progress Notes (Signed)
Daily Session Note  Patient Details  Name: ZAK GONDEK MRN: [id number] Date of Birth: [birth year] Referring Provider:    Encounter Date: 05/27/2016  Check In:     Session Check In - 05/27/16 1118      Check-In   Location [facility]   Staff Present Gerlene Burdock, RN, Vickki Hearing, BA, ACSM CEP, Exercise Physiologist   Physician(s) Dr. Darl Householder Dr. Mable Paris         Goals Met:  Proper associated with RPD/PD & O2 Sat Exercise tolerated well  Goals Unmet:  Not Applicable  Comments:     Dr. Emily Filbert is Medical Director for [place] and [facility].

## 2016-06-01 ENCOUNTER — Encounter: Payer: Self-pay | Admitting: Dietician

## 2016-06-01 DIAGNOSIS — J449 Chronic obstructive pulmonary disease, unspecified: Secondary | ICD-10-CM

## 2016-06-01 NOTE — Progress Notes (Signed)
Daily Session Note  Patient Details  Name: Melvin Brewer MRN: [id number] Date of Birth: 10-17-1950 Referring Provider:    Encounter Date: 06/01/2016  Check In:     Session Check In - 06/01/16 1230      Check-In   Location [facility]   Staff Present Alberteen Sam, MA, ACSM RCEP, Exercise Physiologist;Ugochukwu Chichester Oletta Darter, BA, ACSM CEP, Exercise Physiologist;Laureen Janell Quiet, RRT, Respiratory Therapist   Supervising physician immediately available to respond to emergencies [facility] immediately available ER MD   Physician(s) Quentin Cornwall and Jimmye Norman   Medication changes reported     No   Fall or balance concerns reported    No   Warm-up and Cool-down Performed as group-led instruction   Resistance Training Performed Yes   VAD Patient? No     Pain Assessment   Currently in Pain? No/denies   Multiple Pain Sites No         Goals Met:  Proper associated with RPD/PD & O2 Sat Independence with exercise equipment Exercise tolerated well Strength training completed today  Goals Unmet:  Not Applicable  Comments: Pt able to follow exercise prescription today without complaint.  Will continue to monitor for progression.    Dr. Emily Filbert is Medical Director for [place] and [facility].

## 2016-06-01 NOTE — Progress Notes (Signed)
Met with patient and wife for [facility] RD visit.

## 2016-06-03 ENCOUNTER — Encounter: Payer: Medicare Other | Admitting: *Deleted

## 2016-06-03 DIAGNOSIS — J449 Chronic obstructive pulmonary disease, unspecified: Secondary | ICD-10-CM

## 2016-06-03 NOTE — Progress Notes (Signed)
Daily Session Note  Patient Details  Name: DEVANTAE BABE MRN: [id number] Date of Birth: [birth year] Referring Provider:    Encounter Date: 06/03/2016  Check In:     Session Check In - 06/03/16 1035      Check-In   Location [facility]   Staff Present Gerlene Burdock, RN, Vickki Hearing, BA, ACSM CEP, Exercise Physiologist   Supervising physician immediately available to respond to emergencies [facility] immediately available ER MD   Physician(s) Dr. Joni Fears and Dr. Marcelene Butte   Medication changes reported     No   Fall or balance concerns reported    No   Warm-up and Cool-down Performed as group-led instruction   Resistance Training Performed Yes   VAD Patient? No     Pain Assessment   Currently in Pain? No/denies         Goals Met:  Proper associated with RPD/PD & O2 Sat Exercise tolerated well  Goals Unmet:  Not Applicable  Comments:     Dr. Emily Filbert is Medical Director for [place] and [facility].

## 2016-06-06 ENCOUNTER — Encounter: Payer: Medicare Other | Admitting: *Deleted

## 2016-06-06 DIAGNOSIS — J449 Chronic obstructive pulmonary disease, unspecified: Secondary | ICD-10-CM | POA: Diagnosis not present

## 2016-06-06 NOTE — Progress Notes (Signed)
Daily Session Note  Patient Details  Name: Melvin Brewer MRN: [id number] Date of Birth: 1950-09-30 Referring Provider:    Encounter Date: 06/06/2016  Check In:     Session Check In - 06/06/16 1009      Check-In   Location [facility]   Staff Present Carson Myrtle, BS, RRT, Respiratory Therapist;Lorretta Kerce Amedeo Plenty, BS, ACSM CEP, Exercise Physiologist;Amanda Oletta Darter, BA, ACSM CEP, Exercise Physiologist   Supervising physician immediately available to respond to emergencies [facility] immediately available ER MD   Physician(s) Jimmye Norman and Quentin Cornwall   Medication changes reported     No   Fall or balance concerns reported    No   Warm-up and Cool-down Performed as group-led instruction   Resistance Training Performed Yes   VAD Patient? No     Pain Assessment   Currently in Pain? No/denies   Multiple Pain Sites No         History  Smoking Status  . Former Smoker  Smokeless Tobacco  . Not on file    Goals Met:  Proper associated with RPD/PD & O2 Sat Independence with exercise equipment Exercise tolerated well Strength training completed today  Goals Unmet:  Not Applicable  Comments: Pt able to follow exercise prescription today without complaint.  Will continue to monitor for progression.    Dr. Emily Filbert is Medical Director for [place] and [facility].

## 2016-06-08 ENCOUNTER — Encounter: Payer: Medicare Other | Admitting: Respiratory Therapy

## 2016-06-08 DIAGNOSIS — J449 Chronic obstructive pulmonary disease, unspecified: Secondary | ICD-10-CM

## 2016-06-08 NOTE — Progress Notes (Signed)
Daily Session Note  Patient Details  Name: Melvin Brewer MRN: [id number] Date of Birth: Aug 21, 1950 Referring Provider:    Encounter Date: 06/08/2016  Check In:     Session Check In - 06/08/16 1124      Check-In   Location [facility]   Staff Present Carson Myrtle, BS, RRT, Respiratory Lennie Hummer, MA, ACSM RCEP, Exercise Physiologist;Other   Supervising physician immediately available to respond to emergencies [facility] immediately available ER MD   Physician(s) Jimmye Norman and [place]   Medication changes reported     No   Fall or balance concerns reported    No   Warm-up and Cool-down Performed as group-led instruction   Resistance Training Performed Yes   VAD Patient? No     Pain Assessment   Currently in Pain? No/denies   Multiple Pain Sites No         History  Smoking Status   Former Smoker  Smokeless Tobacco   Not on file    Goals Met:  Proper associated with RPD/PD & O2 Sat Independence with exercise equipment Using PLB without cueing & demonstrates good technique Exercise tolerated well Strength training completed today  Goals Unmet:  Not Applicable  Comments: Pt able to follow exercise prescription today without complaint.  Will continue to monitor for progression.    Dr. Emily Filbert is Medical Director for [place] and [facility].

## 2016-06-10 ENCOUNTER — Encounter: Payer: Medicare Other | Attending: Specialist

## 2016-06-10 DIAGNOSIS — J449 Chronic obstructive pulmonary disease, unspecified: Secondary | ICD-10-CM | POA: Insufficient documentation

## 2016-06-10 NOTE — Progress Notes (Signed)
Daily Session Note  Patient Details  Name: Melvin Brewer MRN: [id number] Date of Birth: 09-05-1950 Referring Provider:    Encounter Date: 06/10/2016  Check In:     Session Check In - 06/10/16 1058      Check-In   Location [facility]   Staff Present Nada Maclachlan, BA, ACSM CEP, Exercise Physiologist;Kaydon Husby RN BSN   Supervising physician immediately available to respond to emergencies [facility] immediately available ER MD   Physician(s) Drs. Lord and [employer]   Medication changes reported     No   Fall or balance concerns reported    No   Tobacco Cessation No Change   Warm-up and Cool-down Performed as group-led Location manager Performed Yes   VAD Patient? No     Pain Assessment   Currently in Pain? No/denies   Multiple Pain Sites No           Exercise Prescription Changes - 06/09/16 1100      Response to Exercise   Blood Pressure (Admit) 118/70   Blood Pressure (Exercise) 166/80   Blood Pressure (Exit) 136/66   Heart Rate (Admit) 65 bpm   Heart Rate (Exercise) 116 bpm   Heart Rate (Exit) 75 bpm   Oxygen Saturation (Admit) 96 %   Oxygen Saturation (Exercise) 93 %   Oxygen Saturation (Exit) 94 %   Rating of Perceived Exertion (Exercise) 15   Perceived Dyspnea (Exercise) 3   Duration Progress to 45 minutes of aerobic exercise without signs/symptoms of physical distress   Intensity THRR unchanged     Progression   Progression Continue to progress workloads to maintain intensity without signs/symptoms of physical distress.     Resistance Training   Training Prescription Yes   Weight 4   Reps 10-15     Interval Training   Interval Training No     NuStep   Level 5   Minutes 15   METs 2.4     Elliptical   Level 1   Minutes 15      History  Smoking Status  . Former Smoker  Smokeless Tobacco  . Not on file    Goals Met:  Proper associated with RPD/PD & O2 Sat Independence with exercise  equipment Using PLB without cueing & demonstrates good technique Exercise tolerated well No report of cardiac concerns or symptoms Strength training completed today  Goals Unmet:  Not Applicable  Comments: Pt able to follow exercise prescription today without complaint.  Will continue to monitor for progression.    Dr. Emily Filbert is Medical Director for [place] and [facility].

## 2016-06-13 ENCOUNTER — Encounter: Payer: Medicare Other | Admitting: *Deleted

## 2016-06-13 DIAGNOSIS — J449 Chronic obstructive pulmonary disease, unspecified: Secondary | ICD-10-CM | POA: Diagnosis not present

## 2016-06-13 NOTE — Progress Notes (Signed)
Daily Session Note  Patient Details  Name: Melvin Brewer MRN: [id number] Date of Birth: February 10, 1951 Referring Provider:    Encounter Date: 06/13/2016  Check In:     Session Check In - 06/13/16 1016      Check-In   Location [facility]   Staff Present Nada Maclachlan, BA, ACSM CEP, Exercise Physiologist;Barrington Worley Amedeo Plenty, BS, ACSM CEP, Exercise Physiologist;Laureen Owens Shark, BS, RRT, Respiratory Therapist   Supervising physician immediately available to respond to emergencies [facility] immediately available ER MD   Physician(s) Corky Downs and Jimmye Norman   Medication changes reported     No   Fall or balance concerns reported    No   Tobacco Cessation No Change   Warm-up and Cool-down Performed as group-led instruction   Resistance Training Performed Yes   VAD Patient? No     Pain Assessment   Currently in Pain? No/denies   Multiple Pain Sites No         History  Smoking Status  . Former Smoker  Smokeless Tobacco  . Not on file    Goals Met:  Proper associated with RPD/PD & O2 Sat Independence with exercise equipment Exercise tolerated well Strength training completed today  Goals Unmet:  Not Applicable  Comments: Pt able to follow exercise prescription today without complaint.  Will continue to monitor for progression.    Dr. Emily Filbert is Medical Director for [place] and [facility].

## 2016-06-15 DIAGNOSIS — J449 Chronic obstructive pulmonary disease, unspecified: Secondary | ICD-10-CM

## 2016-06-15 NOTE — Progress Notes (Signed)
Daily Session Note  Patient Details  Name: Melvin Brewer MRN: [id number] Date of Birth: 03-Jun-1950 Referring Provider:    Encounter Date: 06/15/2016  Check In:     Session Check In - 06/15/16 1239      Check-In   Location [facility]   Staff Present Alberteen Sam, MA, ACSM RCEP, Exercise Physiologist;Amanda Oletta Darter, BA, ACSM CEP, Exercise Physiologist;Laureen Owens Shark, BS, RRT, Respiratory Therapist   Supervising physician immediately available to respond to emergencies [facility] immediately available ER MD   Physician(s) Corky Downs and Archie Balboa   Medication changes reported     No   Warm-up and Cool-down Performed as group-led Location manager Performed Yes   VAD Patient? No     Pain Assessment   Currently in Pain? No/denies         History  Smoking Status  . Former Smoker  Smokeless Tobacco  . Not on file    Goals Met:  Proper associated with RPD/PD & O2 Sat Independence with exercise equipment Exercise tolerated well Strength training completed today  Goals Unmet:  Not Applicable  Comments: Pt able to follow exercise prescription today without complaint.  Will continue to monitor for progression.    Dr. Emily Filbert is Medical Director for [place] and [facility].

## 2016-06-17 ENCOUNTER — Encounter: Payer: Medicare Other | Admitting: *Deleted

## 2016-06-17 DIAGNOSIS — J449 Chronic obstructive pulmonary disease, unspecified: Secondary | ICD-10-CM

## 2016-06-17 NOTE — Progress Notes (Signed)
Daily Session Note  Patient Details  Name: Melvin Brewer MRN: [id number] Date of Birth: 01-05-1951 Referring Provider:    Encounter Date: 06/17/2016  Check In:     Session Check In - 06/17/16 1112      Check-In   Location [facility]   Staff Present Nada Maclachlan, BA, ACSM CEP, Exercise Physiologist;Other  Darel Hong, RN, BSN   Supervising physician immediately available to respond to emergencies [facility] immediately available ER MD   Physician(s) Drs. Quentin Cornwall & [place]   Medication changes reported     No   Fall or balance concerns reported    No   Tobacco Cessation No Change   Warm-up and Cool-down Performed as group-led Location manager Performed Yes   VAD Patient? No     Pain Assessment   Multiple Pain Sites No         History  Smoking Status  . Former Smoker  Smokeless Tobacco  . Not on file    Goals Met:  Proper associated with RPD/PD & O2 Sat Independence with exercise equipment Using PLB without cueing & demonstrates good technique Exercise tolerated well Strength training completed today  Goals Unmet:  Not Applicable  Comments: Pt able to follow exercise prescription today without complaint.  Will continue to monitor for progression.    Dr. Emily Filbert is Medical Director for [place] and [facility].

## 2016-06-20 ENCOUNTER — Encounter: Payer: Self-pay | Admitting: Respiratory Therapy

## 2016-06-20 DIAGNOSIS — J449 Chronic obstructive pulmonary disease, unspecified: Secondary | ICD-10-CM

## 2016-06-20 NOTE — Progress Notes (Signed)
Pulmonary Individual Treatment Plan  Patient Details  Name: Melvin Brewer MRN: [id number] Date of Birth: 05-Mar-1951 Referring Provider:    Initial Encounter Date:  Flowsheet Row Pulmonary Rehab from 04/12/2016 in [facility] Re[facility]  Date  04/12/16      Visit Diagnosis: COPD, moderate ([place])  Patient's Home Medications on Admission:  Current Outpatient Prescriptions:    albuterol (PROVENTIL HFA;VENTOLIN HFA) 108 (90 BASE) MCG/ACT inhaler, Inhale 2 puffs into the lungs every 6 (six) hours as needed for wheezing or shortness of breath., Disp: , Rfl:    amitriptyline (ELAVIL) 50 MG tablet, Take 50 mg by mouth at bedtime., Disp: , Rfl:    aspirin 325 MG EC tablet, Take 325 mg by mouth daily., Disp: , Rfl:    atorvastatin (LIPITOR) 80 MG tablet, Take 80 mg by mouth daily., Disp: , Rfl:    diazepam (VALIUM) 5 MG tablet, Take 1 tablet (5 mg total) by mouth every 6 (six) hours as needed for muscle spasms., Disp: 50 tablet, Rfl: 0   docusate sodium 100 MG CAPS, Take 100 mg by mouth 2 (two) times daily., Disp: 60 capsule, Rfl: 0   fluticasone (FLONASE) 50 MCG/ACT nasal spray, Place 1 spray into both nostrils daily., Disp: , Rfl:    gabapentin (NEURONTIN) 300 MG capsule, Take 300 mg by mouth 3 (three) times daily., Disp: , Rfl:    metoprolol (LOPRESSOR) 50 MG tablet, Take 50 mg by mouth 2 (two) times daily., Disp: , Rfl:    Olodaterol HCl (STRIVERDI RESPIMAT) 2.5 MCG/ACT AERS, Inhale 1 puff into the lungs 2 (two) times daily., Disp: , Rfl:    oxyCODONE-acetaminophen (PERCOCET/ROXICET) 5-325 MG per tablet, Take 1-2 tablets by mouth every 4 (four) hours as needed for moderate pain., Disp: 100 tablet, Rfl: 0   theophylline (THEODUR) 200 MG 12 hr tablet, Take 200 mg by mouth 2 (two) times daily., Disp: , Rfl:    tiotropium (SPIRIVA) 18 MCG inhalation capsule, Place 18 mcg into inhaler and inhale daily., Disp: , Rfl:   Past Medical History: Past Medical History:   Diagnosis Date   COPD (chronic obstructive pulmonary disease)    Coronary artery disease    H/O hiatal hernia    AGE 66 S  NO MEDS   Shortness of breath    W/ EXERTION     Tobacco Use: History  Smoking Status   Former Smoker  Smokeless Tobacco   Not on file    Labs: Recent Review Flowsheet Data    There is no flowsheet data to display.       ADL UCSD:     Pulmonary Assessment Scores    Row Name 04/12/16 1159 05/25/16 1502       ADL UCSD   ADL Phase Entry Mid    SOB Score total 9 30    Rest 0 0    Walk 0 0    Stairs 2 3    Bath 1 1    Dress 1 1    Shop 0 1       Pulmonary Function Assessment:     Pulmonary Function Assessment - 04/12/16 1157      Initial Spirometry Results   FVC% 31 %   FEV1% 28 %   FEV1/FVC Ratio 65.82   Comments Test date - 04/12/16     Post Bronchodilator Spirometry Results   FVC% 29 %   FEV1% 24 %   FEV1/FVC Ratio 68.23     Breath  Bilateral Breath Sounds Clear;Decreased   Shortness of Breath Yes;Limiting activity      Exercise Target Goals:    Exercise Program Goal: Individual exercise prescription set with THRR, safety & activity barriers. Participant demonstrates ability to understand and report RPE using BORG scale, to self-measure pulse accurately, and to acknowledge the importance of the exercise prescription.  Exercise Prescription Goal: Starting with aerobic activity 30 plus minutes a day, 3 days per week for initial exercise prescription. Provide home exercise prescription and guidelines that participant acknowledges understanding prior to discharge.  Activity Barriers & Risk Stratification:     Activity Barriers & Cardiac Risk Stratification - 04/12/16 1157      Activity Barriers & Cardiac Risk Stratification   Activity Barriers Deconditioning;Muscular Weakness;Shortness of Breath;Arthritis   Cardiac Risk Stratification Moderate      6 Minute Walk:     6 Minute Walk    Row Name 04/12/16 1246  05/25/16 1137       6 Minute Walk   Phase  -- Mid Program    Distance 1340 feet 1516 feet    Distance % Change  -- 13 %    Walk Time 6 minutes 6 minutes    # of Rest Breaks 0 0    MPH 2.54 2.87    METS 2.91 4.01    RPE 11 13    Perceived Dyspnea  2 2    VO2 Peak 9.46 14.03    Symptoms No No    Resting HR 73 bpm 68 bpm    Resting BP 130/68 112/64    Max Ex. HR 84 bpm 104 bpm    Max Ex. BP 138/68 144/76      Interval HR   Baseline HR 73 68    1 Minute HR 77 93    2 Minute HR 82 95    3 Minute HR 83  --    4 Minute HR 84 96    5 Minute HR 84 98    6 Minute HR 84 104    2 Minute Post HR  -- 86    Interval Heart Rate? Yes  --      Interval Oxygen   Interval Oxygen? Yes  --    Baseline Oxygen Saturation % 93 % 96 %    1 Minute Oxygen Saturation % 95 % 95 %    2 Minute Oxygen Saturation % 92 % 94 %    3 Minute Oxygen Saturation % 94 % 94 %    4 Minute Oxygen Saturation % 96 % 95 %    5 Minute Oxygen Saturation % 95 % 94 %    6 Minute Oxygen Saturation % 97 % 93 %    2 Minute Post Oxygen Saturation %  -- 96 %      Oxygen Initial Assessment:   Oxygen Re-Evaluation:   Oxygen Discharge (Final Oxygen Re-Evaluation):   Initial Exercise Prescription:     Initial Exercise Prescription - 04/12/16 1200      Date of Initial Exercise RX and Referring Provider   Date 04/12/16     Treadmill   MPH 2.3   Grade 0.5   Minutes 15   METs 2.9     Recumbant Bike   Level 3   RPM 60   Minutes 15   METs 2.9     Elliptical   Level 1   Speed 2.5   Minutes 15  3/3/3   METs 2.9     REL-XR  Level 4   Minutes 15   METs 2.9     T5 Nustep   Level 3   Minutes 15   METs 2.9     Prescription Details   Frequency (times per week) 3   Duration Progress to 45 minutes of aerobic exercise without signs/symptoms of physical distress     Intensity   THRR 40-80% of Max Heartrate 105-137   Ratings of Perceived Exertion 11-15   Perceived Dyspnea 2-4     Progression    Progression Continue to progress workloads to maintain intensity without signs/symptoms of physical distress.     Resistance Training   Training Prescription Yes   Weight 3   Reps 10-15      Perform Capillary Blood Glucose checks as needed.  Exercise Prescription Changes:     Exercise Prescription Changes    Row Name 04/18/16 1100 04/26/16 1600 05/12/16 1300 05/18/16 1100 05/26/16 1100     Response to Exercise   Blood Pressure (Admit) 120/70 122/80 142/70  -- 112/64   Blood Pressure (Exercise) 148/84 178/88 198/80  -- 172/66   Blood Pressure (Exit) 114/68 110/72 126/70  -- 112/66   Heart Rate (Admit) 67 bpm 72 bpm 66 bpm  -- 68 bpm   Heart Rate (Exercise) 111 bpm 123 bpm 115 bpm  -- 101 bpm   Heart Rate (Exit) 90 bpm 80 bpm 83 bpm  -- 76 bpm   Oxygen Saturation (Admit) 94 % 94 % 96 %  -- 96 %   Oxygen Saturation (Exercise) 91 % 92 % 92 %  -- 94 %   Oxygen Saturation (Exit) 92 % 94 % 93 %  -- 95 %   Rating of Perceived Exertion (Exercise) _0 -- 14   Perceived Dyspnea (Exercise) _1 -- 2   Comments  --  --  -- Home Exercise Guidelines given 05/18/16  --   Duration Progress to 45 minutes of aerobic exercise without signs/symptoms of physical distress Progress to 45 minutes of aerobic exercise without signs/symptoms of physical distress Progress to 45 minutes of aerobic exercise without signs/symptoms of physical distress Progress to 45 minutes of aerobic exercise without signs/symptoms of physical distress Progress to 45 minutes of aerobic exercise without signs/symptoms of physical distress   Intensity _2      Progression   Progression Continue to progress workloads to maintain intensity without signs/symptoms of physical distress. Continue to progress workloads to maintain intensity without signs/symptoms of physical distress. Continue to progress workloads to maintain intensity without signs/symptoms of  physical distress. Continue to progress workloads to maintain intensity without signs/symptoms of physical distress. Continue to progress workloads to maintain intensity without signs/symptoms of physical distress.     Resistance Training   Training Prescription _3    Weight _4 Reps 10-15 10-15 10-15 10-15 10-15     Interval Training   Interval Training _5      Treadmill   MPH 2.3 2.3 2.3 2.3  --   Grade 0.5 0.5 0.5 0.5  --   Minutes _6 --   METs 2.9 2.9 2.9 2.9  --     Recumbant Bike   Level 3  --  --  -- 7   RPM 50  --  --  --  --   Minutes 15  --  --  --  15   METs 2.5  --  --  -- 5.6     Elliptical   Level _0 Speed  -- 3.5  --  --  --   Minutes 10  1/1/1  -- 12  4/4/4 12  4/4/4 9     Home Exercise Plan   Plans to continue exercise at  --  --  -- Home  walking and [employer]  --   Frequency  --  --  -- Add 2 additional days to program exercise sessions.  --   Row Name 06/09/16 1100             Response to Exercise   Blood Pressure (Admit) 118/70       Blood Pressure (Exercise) 166/80       Blood Pressure (Exit) 136/66       Heart Rate (Admit) 65 bpm       Heart Rate (Exercise) 116 bpm       Heart Rate (Exit) 75 bpm       Oxygen Saturation (Admit) 96 %       Oxygen Saturation (Exercise) 93 %       Oxygen Saturation (Exit) 94 %       Rating of Perceived Exertion (Exercise) 15       Perceived Dyspnea (Exercise) 3       Duration Progress to 45 minutes of aerobic exercise without signs/symptoms of physical distress       Intensity THRR unchanged         Progression   Progression Continue to progress workloads to maintain intensity without signs/symptoms of physical distress.         Resistance Training   Training Prescription Yes       Weight 4       Reps 10-15         Interval Training   Interval Training No         NuStep   Level 5       Minutes 15       METs 2.4         Elliptical   Level 1        Minutes 15          Exercise Comments:     Exercise Comments    Row Name 04/18/16 1045 04/26/16 1642 05/12/16 1320 05/18/16 1155 05/26/16 1130   Exercise Comments First full day of exercise!  Patient was oriented to gym and equipment including functions, settings, policies, and procedures.  Patient's individual exercise prescription and treatment plan were reviewed.  All starting workloads were established based on the results of the 6 minute walk test done at initial orientation visit.  The plan for exercise progression was also introduced and progression will be customized based on patient's performance and goals. Neftaly is progressing well with exercise. Deo is able to do a greater length of time and greater total time on the elliptical. Reviewed home exercise with pt today.  Pt plans to use [employer] and walk at home for exercise.  Reviewed THR, pulse, RPE, sign and symptoms, NTG use, and when to call 911 or MD.  Also discussed weather considerations and indoor options.  Pt voiced understanding. Purl continues to increase duration on ellitical and improved 176 feet on 6 min walk.   [place] Name 06/09/16 1137           Exercise Comments Deno achieved his goal of 15 minutes on the  Elliptical!          Exercise Goals and Review:   Exercise Goals Re-Evaluation :   Discharge Exercise Prescription (Final Exercise Prescription Changes):     Exercise Prescription Changes - 06/09/16 1100      Response to Exercise   Blood Pressure (Admit) 118/70   Blood Pressure (Exercise) 166/80   Blood Pressure (Exit) 136/66   Heart Rate (Admit) 65 bpm   Heart Rate (Exercise) 116 bpm   Heart Rate (Exit) 75 bpm   Oxygen Saturation (Admit) 96 %   Oxygen Saturation (Exercise) 93 %   Oxygen Saturation (Exit) 94 %   Rating of Perceived Exertion (Exercise) 15   Perceived Dyspnea (Exercise) 3   Duration Progress to 45 minutes of aerobic exercise without signs/symptoms of physical distress    Intensity THRR unchanged     Progression   Progression Continue to progress workloads to maintain intensity without signs/symptoms of physical distress.     Resistance Training   Training Prescription Yes   Weight 4   Reps 10-15     Interval Training   Interval Training No     NuStep   Level 5   Minutes 15   METs 2.4     Elliptical   Level 1   Minutes 15      Nutrition:  Target Goals: Understanding of nutrition guidelines, daily intake of sodium <1538m, cholesterol <2047m calories 30% from fat and 7% or less from saturated fats, daily to have 5 or more servings of fruits and vegetables.  Biometrics:     Pre Biometrics - 04/12/16 1245      Pre Biometrics   Height _0  (1.778 m)   Weight 219 lb 6.4 oz (99.5 kg)   Waist Circumference 48.5 inches   Hip Circumference 45 inches   Waist to Hip Ratio 1.08 %   BMI (Calculated) 31.5       Nutrition Therapy Plan and Nutrition Goals:     Nutrition Therapy & Goals - 06/01/16 1705      Nutrition Therapy   Diet DASH   Drug/Food Interactions Statins/Certain Fruits   Protein (specify units) 12oz   Fiber 25 grams   Whole Grain Foods 3 servings   Saturated Fats 14 max. grams   Fruits and Vegetables 8 servings/day   Sodium 1500 grams     Personal Nutrition Goals   Nutrition Goal Gradually increase healthy carbs; aim for 9 servings daily, and work towards 12 servings daily.   Personal Goal #2 Aim for 1200 calories, gradually working up to 1700-1800calories over the next several months.    Comments Patient has been following very low-carb diet for weight loss. He has lost 14lbs over the past several weeks. He is tracking his carb intake using a phone app, and is ranging from 12-60g. carbohydrate daily. Minimum needs = 130g daily. Advised him to increase carb and calorie intake slowly to allow for ongoing healthy weight loss.      Intervention Plan   Intervention Prescribe, educate and counsel regarding individualized  specific dietary modifications aiming towards targeted core components such as weight, hypertension, lipid management, diabetes, heart failure and other comorbidities.;Nutrition handout(s) given to patient.   Expected Outcomes Short Term Goal: Understand basic principles of dietary content, such as calories, fat, sodium, cholesterol and nutrients.;Short Term Goal: A plan has been developed with personal nutrition goals set during dietitian appointment.;Long Term Goal: Adherence to prescribed nutrition plan.      Nutrition Discharge: Rate Your Plate  Scores:   Nutrition Goals Re-Evaluation:   Nutrition Goals Discharge (Final Nutrition Goals Re-Evaluation):   Psychosocial: Target Goals: Acknowledge presence or absence of significant depression and/or stress, maximize coping skills, provide positive support system. Participant is able to verbalize types and ability to use techniques and skills needed for reducing stress and depression.   Initial Review & Psychosocial Screening:     Initial Psych Review & Screening - 04/12/16 [place]? Yes   Comments Mr Winterhalter has good support from his wife and son. He has his [place] faith and no depression. Mr Milley is looking forward to participating in [place], getting back to exercise and weight loss.     Barriers   Psychosocial barriers to participate in program The patient should benefit from training in stress management and relaxation.     Screening Interventions   Interventions Encouraged to exercise      Quality of Life Scores:     Quality of Life - 04/12/16 1206      Quality of Life Scores   Health/Function Pre 20.73 %   Socioeconomic Pre 20.29 %   Psych/Spiritual Pre 21 %   Family Pre 21 %   GLOBAL Pre 20.74 %      PHQ-9: Recent Review Flowsheet Data    Depression screen [facility] 2/9 04/12/2016   Decreased Interest 0   Down, Depressed, Hopeless 0   PHQ - 2 Score 0   Altered  sleeping 1   Tired, decreased energy 0   Change in appetite 1   Feeling bad or failure about yourself  0   Trouble concentrating 0   Moving slowly or fidgety/restless 0   Suicidal thoughts 0   PHQ-9 Score 2   Difficult doing work/chores Not difficult at all     Interpretation of Total Score  Total Score Depression Severity:  1-4 = Minimal depression, 5-9 = Mild depression, 10-14 = Moderate depression, 15-19 = Moderately severe depression, 20-27 = Severe depression   Psychosocial Evaluation and Intervention:     Psychosocial Evaluation - 04/20/16 1109      Psychosocial Evaluation & Interventions   Interventions Encouraged to exercise with the program and follow exercise prescription   Comments Counselor met with Mr. D today for initial psychosocial evaluation.  He is a 66 year old that was diagnosed with COPD 8-10 years ago.  He has a stron support system with a spouse of 20 years and active involvement in his local church.  Mr. D sleeps well and has a good appetite.  He denies a history of depression or anxiety or any current symptoms; and reports he is typically in a positive mood most of the time.  He has minimal stress in his life at this time.  Mr. D has goals to be educated on the limits of exercising with this diagnosis; and just to breathe better in general.  Staff will follow with Mr. D throughout the course of this program.        Psychosocial Re-Evaluation:     Psychosocial Re-Evaluation    Row Name 05/11/16 1542 06/08/16 1010           Psychosocial Re-Evaluation   Comments Berneta Sages psychosocial assessment reveals no barriers at this time to participation in Pulmonary Rehab.  He  has good family and friend support that encourages Jahmier to participate in [place] and progress with His goals.  Kamarii concerns are monitored, but he  has acknowledge  that attending the program has helped to maintain quality life with improved mobility, self-care, and emotional and financial  stability.  Jamail is commended for regular attendance and self-motivation to improve His pulmonary disease management. Counselor follow up with Berneta Sages today.  He reports experiencing some definite progress in his goals to increase his stamina citing his first day in this program he had to stop on the elliptical after each minute to rest and now is going 12 straight minutes without stopping.  His support system continues to be strong and he is learning what he is able to do to improve his breathing and quality of life.  He continues to sleep well with the use of Melatonin at night.  Counselor commended Hammad on his progress made and commitment to consistent exercise and positive self-care.       Expected Outcomes  --  Schawn will continue to participate regularly in the Pulmonary rehab program and work toward his goals.  He has seen & reported progress and his goals will continue to be monitored by staff.            Psychosocial Discharge (Final Psychosocial Re-Evaluation):     Psychosocial Re-Evaluation - 06/08/16 1010      Psychosocial Re-Evaluation   Comments Counselor follow up with Berneta Sages today.  He reports experiencing some definite progress in his goals to increase his stamina citing his first day in this program he had to stop on the elliptical after each minute to rest and now is going 12 straight minutes without stopping.  His support system continues to be strong and he is learning what he is able to do to improve his breathing and quality of life.  He continues to sleep well with the use of Melatonin at night.  Counselor commended Malek on his progress made and commitment to consistent exercise and positive self-care.    Expected Outcomes  Adonias will continue to participate regularly in the Pulmonary rehab program and work toward his goals.  He has seen & reported progress and his goals will continue to be monitored by staff.         Education: Education Goals: Education classes will  be provided on a weekly basis, covering required topics. Participant will state understanding/return demonstration of topics presented.  Learning Barriers/Preferences:     Learning Barriers/Preferences - 04/12/16 1157      Learning Barriers/Preferences   Learning Barriers None   Learning Preferences None      Education Topics: Initial Evaluation Education: - Verbal, written and demonstration of respiratory meds, RPE/PD scales, oximetry and breathing techniques. Instruction on use of nebulizers and MDIs: cleaning and proper use, rinsing mouth with steroid doses and importance of monitoring MDI activations. Flowsheet Row Pulmonary Rehab from 06/15/2016 in [facility] Cardiac and Pulmonary Rehab  Date  04/12/16  Educator  LB  Instruction Review Code  2- meets goals/outcomes      General Nutrition Guidelines/Fats and Fiber: -Group instruction provided by verbal, written material, models and posters to present the general guidelines for heart healthy nutrition. Gives an explanation and review of dietary fats and fiber. Flowsheet Row Pulmonary Rehab from 06/15/2016 in [facility] Cardiac and Pulmonary Rehab  Date  04/25/16  Educator  CR  Instruction Review Code  2- meets goals/outcomes      Controlling Sodium/Reading Food Labels: -Group verbal and written material supporting the discussion of sodium use in heart healthy nutrition. Review and explanation with models, verbal and written materials for utilization of the food label.  Flowsheet Row Pulmonary Rehab from 06/15/2016 in [facility] Cardiac and Pulmonary Rehab  Date  05/02/16  Educator  CR  Instruction Review Code  2- meets goals/outcomes      Exercise Physiology & Risk Factors: - Group verbal and written instruction with models to review the exercise physiology of the cardiovascular system and associated critical values. Details cardiovascular disease risk factors and the goals associated with each risk factor.   Aerobic Exercise & Resistance  Training: - Gives group verbal and written discussion on the health impact of inactivity. On the components of aerobic and resistive training programs and the benefits of this training and how to safely progress through these programs. Flowsheet Row Pulmonary Rehab from 06/15/2016 in [facility] Cardiac and Pulmonary Rehab  Date  05/11/16  Educator  [facility]  Instruction Review Code  2- meets goals/outcomes      Flexibility, Balance, General Exercise Guidelines: - Provides group verbal and written instruction on the benefits of flexibility and balance training programs. Provides general exercise guidelines with specific guidelines to those with heart or lung disease. Demonstration and skill practice provided. Flowsheet Row Pulmonary Rehab from 06/15/2016 in [facility] Cardiac and Pulmonary Rehab  Date  06/03/16  Educator  Nada Maclachlan  Instruction Review Code  2- meets goals/outcomes      Stress Management: - Provides group verbal and written instruction about the health risks of elevated stress, cause of high stress, and healthy ways to reduce stress.   Depression: - Provides group verbal and written instruction on the correlation between heart/lung disease and depressed mood, treatment options, and the stigmas associated with seeking treatment. Flowsheet Row Pulmonary Rehab from 06/15/2016 in [facility] Cardiac and Pulmonary Rehab  Date  06/15/16  Educator  [facility]  Instruction Review Code  2- meets goals/outcomes      Exercise & Equipment Safety: - Individual verbal instruction and demonstration of equipment use and safety with use of the equipment. Flowsheet Row Pulmonary Rehab from 06/15/2016 in [facility] Cardiac and Pulmonary Rehab  Date  04/18/16  Educator  [facility]  Instruction Review Code  2- meets goals/outcomes      Infection Prevention: - Provides verbal and written material to individual with discussion of infection control including proper hand washing and proper equipment cleaning during exercise  session. Flowsheet Row Pulmonary Rehab from 06/15/2016 in [facility] Cardiac and Pulmonary Rehab  Date  04/18/16  Educator  [facility]  Instruction Review Code  2- meets goals/outcomes      Falls Prevention: - Provides verbal and written material to individual with discussion of falls prevention and safety. Flowsheet Row Pulmonary Rehab from 06/15/2016 in [facility] Cardiac and Pulmonary Rehab  Date  04/12/16  Educator  LB  Instruction Review Code  2- meets goals/outcomes      Diabetes: - Individual verbal and written instruction to review signs/symptoms of diabetes, desired ranges of glucose level fasting, after meals and with exercise. Advice that pre and post exercise glucose checks will be done for 3 sessions at entry of program.   Chronic Lung Diseases: - Group verbal and written instruction to review new updates, new respiratory medications, new advancements in procedures and treatments. Provide informative websites and "800" numbers of self-education. Flowsheet Row Pulmonary Rehab from 06/15/2016 in [facility] Cardiac and Pulmonary Rehab  Date  05/04/16  Educator  LB  Instruction Review Code  2- meets goals/outcomes      Lung Procedures: - Group verbal and written instruction to describe testing methods done to diagnose lung disease. Review the outcome of  test results. Describe the treatment choices: Pulmonary Function Tests, ABGs and oximetry.   Energy Conservation: - Provide group verbal and written instruction for methods to conserve energy, plan and organize activities. Instruct on pacing techniques, use of adaptive equipment and posture/positioning to relieve shortness of breath. Flowsheet Row Pulmonary Rehab from 06/15/2016 in [facility] Cardiac and Pulmonary Rehab  Date  06/08/16  Educator  [facility]  Instruction Review Code  2- meets goals/outcomes      Triggers: - Group verbal and written instruction to review types of environmental controls: home humidity, furnaces, filters, dust mite/pet prevention,  HEPA vacuums. To discuss weather changes, air quality and the benefits of nasal washing. Flowsheet Row Pulmonary Rehab from 06/15/2016 in [facility] Cardiac and Pulmonary Rehab  Date  06/01/16  Educator  LB  Instruction Review Code  2- meets goals/outcomes      Exacerbations: - Group verbal and written instruction to provide: warning signs, infection symptoms, calling MD promptly, preventive modes, and value of vaccinations. Review: effective airway clearance, coughing and/or vibration techniques. Create an Sports administrator. Flowsheet Row Pulmonary Rehab from 06/15/2016 in [facility] Cardiac and Pulmonary Rehab  Date  05/25/16  Educator  LB  Instruction Review Code  2- meets goals/outcomes      Oxygen: - Individual and group verbal and written instruction on oxygen therapy. Includes supplement oxygen, available portable oxygen systems, continuous and intermittent flow rates, oxygen safety, concentrators, and Medicare reimbursement for oxygen.   Respiratory Medications: - Group verbal and written instruction to review medications for lung disease. Drug class, frequency, complications, importance of spacers, rinsing mouth after steroid MDI's, and proper cleaning methods for nebulizers. Flowsheet Row Pulmonary Rehab from 06/15/2016 in [facility] Cardiac and Pulmonary Rehab  Date  04/12/16  Educator  LB  Instruction Review Code  2- meets goals/outcomes      AED/CPR: - Group verbal and written instruction with the use of models to demonstrate the basic use of the AED with the basic ABC's of resuscitation.   Breathing Retraining: - Provides individuals verbal and written instruction on purpose, frequency, and proper technique of diaphragmatic breathing and pursed-lipped breathing. Applies individual practice skills. Flowsheet Row Pulmonary Rehab from 06/15/2016 in [facility] Cardiac and Pulmonary Rehab  Date  04/18/16  Educator  [facility]  Instruction Review Code  2- meets goals/outcomes      Anatomy and Physiology of the  Lungs: - Group verbal and written instruction with the use of models to provide basic lung anatomy and physiology related to function, structure and complications of lung disease.   Heart Failure: - Group verbal and written instruction on the basics of heart failure: signs/symptoms, treatments, explanation of ejection fraction, enlarged heart and cardiomyopathy. Flowsheet Row Pulmonary Rehab from 06/15/2016 in [facility] Cardiac and Pulmonary Rehab  Date  05/27/16  Educator  CE  Instruction Review Code  2- meets goals/outcomes      Sleep Apnea: - Individual verbal and written instruction to review Obstructive Sleep Apnea. Review of risk factors, methods for diagnosing and types of masks and machines for OSA.   Anxiety: - Provides group, verbal and written instruction on the correlation between heart/lung disease and anxiety, treatment options, and management of anxiety. Flowsheet Row Pulmonary Rehab from 06/15/2016 in [facility] Cardiac and Pulmonary Rehab  Date  04/20/16  Educator  [facility]  Instruction Review Code  2- Meets goals/outcomes      Relaxation: - Provides group, verbal and written instruction about the benefits of relaxation for patients with heart/lung disease. Also provides patients with  examples of relaxation techniques. Flowsheet Row Pulmonary Rehab from 06/15/2016 in [facility] Cardiac and Pulmonary Rehab  Date  05/18/16  Educator  [facility]  Instruction Review Code  2- Meets goals/outcomes      Knowledge Questionnaire Score:     Knowledge Questionnaire Score - 04/12/16 1157      Knowledge Questionnaire Score   Pre Score 6/10       Core Components/Risk Factors/Patient Goals at Admission:     Personal Goals and Risk Factors at Admission - 04/12/16 1201      Core Components/Risk Factors/Patient Goals on Admission    Weight Management Yes;Weight Loss   Intervention Weight Management: Provide education and appropriate resources to help participant work on and attain dietary goals.    Admit Weight 223 lb (101.2 kg)   Goal Weight: Short Term 198 lb (89.8 kg)   Goal Weight: Long Term 180 lb (81.6 kg)   Expected Outcomes Short Term: Continue to assess and modify interventions until short term weight is achieved;Long Term: Adherence to nutrition and physical activity/exercise program aimed toward attainment of established weight goal;Weight Loss: Understanding of general recommendations for a balanced deficit meal plan, which promotes 1-2 lb weight loss per week and includes a negative energy balance of [phone] kcal/d;Understanding recommendations for meals to include 15-35% energy as protein, 25-35% energy from fat, 35-60% energy from carbohydrates, less than 236m of dietary cholesterol, 20-35 gm of total fiber daily;Understanding of distribution of calorie intake throughout the day with the consumption of 4-5 meals/snacks   Sedentary Yes  Mr DCoblehas been walking 1.92mes several days/week. He also has a gyBuilding services engineero [employer]  Intervention Provide advice, education, support and counseling about physical activity/exercise needs.;Develop an individualized exercise prescription for aerobic and resistive training based on initial evaluation findings, risk stratification, comorbidities and participant's personal goals.   Expected Outcomes Achievement of increased cardiorespiratory fitness and enhanced flexibility, muscular endurance and strength shown through measurements of functional capacity and personal statement of participant.   Increase Strength and Stamina Yes   Intervention Provide advice, education, support and counseling about physical activity/exercise needs.;Develop an individualized exercise prescription for aerobic and resistive training based on initial evaluation findings, risk stratification, comorbidities and participant's personal goals.   Expected Outcomes Achievement of increased cardiorespiratory fitness and enhanced flexibility, muscular endurance and  strength shown through measurements of functional capacity and personal statement of participant.   Improve shortness of breath with ADL's Yes   Intervention Provide education, individualized exercise plan and daily activity instruction to help decrease symptoms of SOB with activities of daily living.   Expected Outcomes Short Term: Achieves a reduction of symptoms when performing activities of daily living.   Develop more efficient breathing techniques such as purse lipped breathing and diaphragmatic breathing; and practicing self-pacing with activity Yes   Intervention Provide education, demonstration and support about specific breathing techniuqes utilized for more efficient breathing. Include techniques such as pursed lipped breathing, diaphragmatic breathing and self-pacing activity.   Expected Outcomes Short Term: Participant will be able to demonstrate and use breathing techniques as needed throughout daily activities.   Increase knowledge of respiratory medications and ability to use respiratory devices properly  Yes  Symbicort, ProAir, Spiriva, Spacer given with instructions; Theophylline   Intervention Provide education and demonstration as needed of appropriate use of medications, inhalers, and oxygen therapy.   Expected Outcomes Short Term: Achieves understanding of medications use. Understands that oxygen is a medication prescribed by physician. Demonstrates appropriate use of inhaler and oxygen  therapy.   Hypertension Yes   Intervention Provide education on lifestyle modifcations including regular physical activity/exercise, weight management, moderate sodium restriction and increased consumption of fresh fruit, vegetables, and low fat dairy, alcohol moderation, and smoking cessation.;Monitor prescription use compliance.   Expected Outcomes Short Term: Continued assessment and intervention until BP is < 140/1m HG in hypertensive participants. < 130/872mHG in hypertensive participants  with diabetes, heart failure or chronic kidney disease.;Long Term: Maintenance of blood pressure at goal levels.   Lipids Yes   Intervention Provide education and support for participant on nutrition & aerobic/resistive exercise along with prescribed medications to achieve LDL <7082mHDL >82m57m Expected Outcomes Short Term: Participant states understanding of desired cholesterol values and is compliant with medications prescribed. Participant is following exercise prescription and nutrition guidelines.;Long Term: Cholesterol controlled with medications as prescribed, with individualized exercise RX and with personalized nutrition plan. Value goals: LDL < 70mg48mL > 40 mg.      Core Components/Risk Factors/Patient Goals Review:      Goals and Risk Factor Review    Row Name 04/18/16 1043 04/18/16 1121 05/06/16 1139         Core Components/Risk Factors/Patient Goals Review   Personal Goals Review Develop more efficient breathing techniques such as purse lipped breathing and diaphragmatic breathing and practicing self-pacing with activity. Weight Management/Obesity Weight Management/Obesity;Sedentary;Increase Strength and Stamina;Other     Review Pursed lip breathing was discussed and demonstrated with patient. He demonstarted understanding of this breathing technique and when to use it.  Patient stated that he was interested in meeting with the dietician. He was given the paperwork and contact information to schedule an appointment.  GeralMannhad a cold and is finishing up a Zpac and prednisone.  Even with this, he has imporved his time on Elliptical from 1 - 3 1/2 minutes, and overall is down 11 1/2 lb.     Expected Outcomes Patient will use PLB during exercise and ADL's to help control SOB and maintian an acceptable oxygen saturation.  Patient will call and make an appointment to meet with the dietician.  GeralDamier continue regular exercise and see improvement in his stamina, and will  continue to be successful at weight loss.        Core Components/Risk Factors/Patient Goals at Discharge (Final Review):      Goals and Risk Factor Review - 05/06/16 1139      Core Components/Risk Factors/Patient Goals Review   Personal Goals Review Weight Management/Obesity;Sedentary;Increase Strength and Stamina;Other   Review GeralShepherdhad a cold and is finishing up a Zpac and prednisone.  Even with this, he has imporved his time on Elliptical from 1 - 3 1/2 minutes, and overall is down 11 1/2 lb.   Expected Outcomes GeralBlanca continue regular exercise and see improvement in his stamina, and will continue to be successful at weight loss.      ITP Comments:     ITP Comments    Row Name 05/06/16 1105 05/11/16 1136 06/06/16 1555       ITP Comments Mr. DickeBettenunable to complete his full exercise prescription today.  At end of second machine, he stopped exercising and stated that "this cold is getting the best of me".  He has had a "cold" for approximately 1 month and is currently on prednisone and other medications without improvement.  SpO2 sat WNL.  Pt was advised to call doctor and left early.  Will continue to monitor for progression.  Mr. Lepkowski had his 30 face to face with Dr. Sabra Heck today. Mr Leitzel had his 30 day face to face with Dr Sabra Heck today.        Comments: 30 Day Note Review

## 2016-06-22 DIAGNOSIS — J449 Chronic obstructive pulmonary disease, unspecified: Secondary | ICD-10-CM

## 2016-06-22 NOTE — Progress Notes (Signed)
Daily Session Note  Patient Details  Name: Melvin Brewer MRN: [id number] Date of Birth: December 22, 1950 Referring Provider:    Encounter Date: 06/22/2016  Check In:     Session Check In - 06/22/16 1211      Check-In   Location [facility]-Cardiac & Pulmonary Rehab   Staff Present Carson Myrtle, BS, RRT, Respiratory Dareen Piano, BA, ACSM CEP, Exercise Physiologist;Other  Darel Hong RN   Supervising physician immediately available to respond to emergencies [facility] immediately available ER MD   Physician(s) Marcelene Butte and Darl Householder   Medication changes reported     No   Fall or balance concerns reported    No   Warm-up and Cool-down Performed as group-led instruction         History  Smoking Status  . Former Smoker  Smokeless Tobacco  . Not on file    Goals Met:  Proper associated with RPD/PD & O2 Sat Independence with exercise equipment Exercise tolerated well Strength training completed today  Goals Unmet:  Not Applicable  Comments: Pt able to follow exercise prescription today without complaint.  Will continue to monitor for progression.    Dr. Emily Filbert is Medical Director for [place] and [facility].

## 2016-06-24 DIAGNOSIS — J449 Chronic obstructive pulmonary disease, unspecified: Secondary | ICD-10-CM

## 2016-06-24 NOTE — Progress Notes (Signed)
Daily Session Note  Patient Details  Name: Melvin Brewer MRN: [id number] Date of Birth: Feb 02, 1951 Referring Provider:    Encounter Date: 06/24/2016  Check In:     Session Check In - 06/24/16 1041      Check-In   Location [facility]-Cardiac & Pulmonary Rehab   Staff Present Levell July RN Vickki Hearing, BA, ACSM CEP, Exercise Physiologist   Supervising physician immediately available to respond to emergencies [facility] immediately available ER MD   Physician(s) Drs. Lord and [employer]   Medication changes reported     No   Fall or balance concerns reported    No   Tobacco Cessation No Change   Warm-up and Cool-down Performed as group-led Location manager Performed Yes   VAD Patient? No     Pain Assessment   Currently in Pain? No/denies   Multiple Pain Sites No         History  Smoking Status  . Former Smoker  Smokeless Tobacco  . Not on file    Goals Met:  Proper associated with RPD/PD & O2 Sat Independence with exercise equipment Using PLB without cueing & demonstrates good technique Exercise tolerated well Strength training completed today  Goals Unmet:  Not Applicable  Comments: Pt able to follow exercise prescription today without complaint.  Will continue to monitor for progression.    Dr. Emily Filbert is Medical Director for [place] and [facility].

## 2016-06-27 ENCOUNTER — Encounter: Payer: Medicare Other | Admitting: *Deleted

## 2016-06-27 DIAGNOSIS — J449 Chronic obstructive pulmonary disease, unspecified: Secondary | ICD-10-CM

## 2016-06-27 NOTE — Progress Notes (Signed)
Daily Session Note  Patient Details  Name: Melvin Brewer MRN: [id number] Date of Birth: 1951-02-19 Referring Provider:    Encounter Date: 06/27/2016  Check In:     Session Check In - 06/27/16 1016      Check-In   Location [facility]-Cardiac & Pulmonary Rehab   Staff Present Earlean Shawl, BS, ACSM CEP, Exercise Physiologist;Laureen Owens Shark, BS, RRT, Respiratory Dareen Piano, BA, ACSM CEP, Exercise Physiologist   Supervising physician immediately available to respond to emergencies [facility] immediately available ER MD   Physician(s) Reita Cliche and Alfred Levins   Medication changes reported     No   Fall or balance concerns reported    No   Warm-up and Cool-down Performed as group-led Location manager Performed Yes   VAD Patient? No     Pain Assessment   Currently in Pain? No/denies   Multiple Pain Sites No         History  Smoking Status  . Former Smoker  Smokeless Tobacco  . Not on file    Goals Met:  Proper associated with RPD/PD & O2 Sat Independence with exercise equipment Exercise tolerated well Strength training completed today  Goals Unmet:  Not Applicable  Comments: Pt able to follow exercise prescription today without complaint.  Will continue to monitor for progression.    Dr. Emily Filbert is Medical Director for [place] and [facility].

## 2016-07-01 ENCOUNTER — Encounter: Payer: Medicare Other | Admitting: *Deleted

## 2016-07-01 DIAGNOSIS — J449 Chronic obstructive pulmonary disease, unspecified: Secondary | ICD-10-CM | POA: Diagnosis not present

## 2016-07-01 NOTE — Progress Notes (Signed)
Daily Session Note  Patient Details  Name: Melvin Brewer MRN: [id number] Date of Birth: Mar 10, 1951 Referring Provider:    Encounter Date: 07/01/2016  Check In:     Session Check In - 07/01/16 1058      Check-In   Location [facility]-Cardiac & Pulmonary Rehab   Staff Present Nyoka Cowden, RN, BSN, Walden Field, BS, RRT, Respiratory Therapist;Other  Darel Hong RN BSN   Supervising physician immediately available to respond to emergencies [facility] immediately available ER MD   Physician(s) Drs. Alfred Levins and [place]   Medication changes reported     No   Fall or balance concerns reported    No   Tobacco Cessation No Change   Warm-up and Cool-down Not performed (comment)   Resistance Training Performed Yes   VAD Patient? No     Pain Assessment   Currently in Pain? No/denies   Multiple Pain Sites No         History  Smoking Status  . Former Smoker  Smokeless Tobacco  . Not on file    Goals Met:  Proper associated with RPD/PD & O2 Sat Independence with exercise equipment Using PLB without cueing & demonstrates good technique Exercise tolerated well Strength training completed today  Goals Unmet:  Not Applicable  Comments: Pt able to follow exercise prescription today without complaint.  Will continue to monitor for progression.    Dr. Emily Filbert is Medical Director for [place] and [facility].

## 2016-07-04 ENCOUNTER — Encounter: Payer: Medicare Other | Admitting: *Deleted

## 2016-07-04 DIAGNOSIS — J449 Chronic obstructive pulmonary disease, unspecified: Secondary | ICD-10-CM

## 2016-07-04 NOTE — Progress Notes (Signed)
Daily Session Note  Patient Details  Name: Melvin Brewer MRN: [id number] Date of Birth: 03-22-1951 Referring Provider:    Encounter Date: 07/04/2016  Check In:     Session Check In - 07/04/16 1018      Check-In   Location [facility]-Cardiac & Pulmonary Rehab   Staff Present Nada Maclachlan, BA, ACSM CEP, Exercise Physiologist;[place] President Amedeo Plenty, BS, ACSM CEP, Exercise Physiologist;Laureen Owens Shark, BS, RRT, Respiratory Therapist   Supervising physician immediately available to respond to emergencies [facility] immediately available ER MD   Physician(s) Clearnce Hasten and Jimmye Norman   Medication changes reported     No   Fall or balance concerns reported    No   Warm-up and Cool-down Performed as group-led instruction   Resistance Training Performed Yes   VAD Patient? No     Pain Assessment   Currently in Pain? No/denies   Multiple Pain Sites No         History  Smoking Status  . Former Smoker  Smokeless Tobacco  . Not on file    Goals Met:  Proper associated with RPD/PD & O2 Sat Independence with exercise equipment Exercise tolerated well Strength training completed today  Goals Unmet:  Not Applicable  Comments: Pt able to follow exercise prescription today without complaint.  Will continue to monitor for progression.    Dr. Emily Filbert is Medical Director for [place] and [facility].

## 2016-07-06 VITALS — Ht 70.0 in | Wt 209.2 lb

## 2016-07-06 DIAGNOSIS — J449 Chronic obstructive pulmonary disease, unspecified: Secondary | ICD-10-CM | POA: Diagnosis not present

## 2016-07-06 NOTE — Progress Notes (Signed)
Daily Session Note  Patient Details  Name: Melvin Brewer MRN: [id number] Date of Birth: 1950-07-19 Referring Provider:    Encounter Date: 07/06/2016  Check In:     Session Check In - 07/06/16 1212      Check-In   Location [facility]-Cardiac & Pulmonary Rehab   Staff Present Carson Myrtle, BS, RRT, Respiratory Lennie Hummer, MA, ACSM RCEP, Exercise Physiologist;Lashon Beringer Oletta Darter, BA, ACSM CEP, Exercise Physiologist   Supervising physician immediately available to respond to emergencies [facility] immediately available ER MD   Physician(s) Kerman Passey and Jimmye Norman   Medication changes reported     No   Fall or balance concerns reported    No   Warm-up and Cool-down Performed as group-led instruction   Resistance Training Performed Yes   VAD Patient? No     Pain Assessment   Currently in Pain? No/denies           Exercise Prescription Changes - 07/06/16 1200      Response to Exercise   Blood Pressure (Admit) 124/64   Blood Pressure (Exercise) 126/74   Blood Pressure (Exit) 118/68   Heart Rate (Admit) 64 bpm   Heart Rate (Exercise) 132 bpm   Heart Rate (Exit) 72 bpm   Oxygen Saturation (Admit) 93 %   Oxygen Saturation (Exercise) 91 %   Oxygen Saturation (Exit) 95 %   Rating of Perceived Exertion (Exercise) 14   Perceived Dyspnea (Exercise) 2   Duration Continue with 45 min of aerobic exercise without signs/symptoms of physical distress.   Intensity THRR unchanged     Progression   Progression Continue to progress workloads to maintain intensity without signs/symptoms of physical distress.     Resistance Training   Training Prescription Yes   Weight 4   Reps 10-15     Interval Training   Interval Training Yes     Treadmill   MPH 2.4   Grade 1.5   Minutes 15   METs 3.33     NuStep   Level 6   SPM 84   Minutes 15   METs 3.5      History  Smoking Status  . Former Smoker  Smokeless Tobacco  . Not on file    Goals Met:  Proper associated  with RPD/PD & O2 Sat Independence with exercise equipment Exercise tolerated well Strength training completed today  Goals Unmet:  Not Applicable  Comments:      [place] Name 04/12/16 1246 05/25/16 1137 07/06/16 1217     6 Minute Walk   Phase  - Mid Program Discharge   Distance 1340 feet 1516 feet 1700 feet   Distance % Change  - 13 % 26 %   Walk Time 6 minutes 6 minutes 6 minutes   # of Rest Breaks 0 0 0   MPH 2.54 2.87 3.21   METS 2.91 4.01 3.55   RPE _0 Perceived Dyspnea  _1 VO2 Peak 9.46 14.03 12.44   Symptoms No No Yes (comment)   Comments  -  - calves burning 6/10   Resting HR 73 bpm 68 bpm 64 bpm   Resting BP 130/68 112/64 124/64   Max Ex. HR 84 bpm 104 bpm 94 bpm   Max Ex. BP 138/68 144/76 126/74     Interval HR   Baseline HR 73 68 64   1 Minute HR 77 93 87   2 Minute HR 82 95 97  3 Minute HR 83  -  -   4 Minute HR 84 96  -   5 Minute HR 84 98  -   6 Minute HR 84 104 94   2 Minute Post HR  - 86 80   Interval Heart Rate? Yes  - Yes     Interval Oxygen   Interval Oxygen? Yes  - Yes   Baseline Oxygen Saturation % 93 % 96 % 93 %   1 Minute Oxygen Saturation % 95 % 95 % 95 %   2 Minute Oxygen Saturation % 92 % 94 % 93 %   3 Minute Oxygen Saturation % 94 % 94 %  -   4 Minute Oxygen Saturation % 96 % 95 %  -   5 Minute Oxygen Saturation % 95 % 94 % 93 %   6 Minute Oxygen Saturation % 97 % 93 % 93 %   2 Minute Post Oxygen Saturation %  - 96 % 95 %        Dr. Emily Filbert is Medical Director for [place] and [facility].

## 2016-07-08 DIAGNOSIS — J449 Chronic obstructive pulmonary disease, unspecified: Secondary | ICD-10-CM | POA: Diagnosis not present

## 2016-07-08 NOTE — Progress Notes (Signed)
Daily Session Note  Patient Details  Name: LYLE NIBLETT MRN: [id number] Date of Birth: [birth year] Referring Provider:    Encounter Date: 07/08/2016  Check In:     Session Check In - 07/08/16 1031      Check-In   Location [facility]-Cardiac & Pulmonary Rehab   Staff Present Nyoka Cowden, RN, BSN, Kela Millin, BA, ACSM CEP, Exercise Physiologist   Supervising physician immediately available to respond to emergencies [facility] immediately available ER MD   Physician(s) Drs. Malinda and Lord   Medication changes reported     No   Fall or balance concerns reported    No   Warm-up and Cool-down Performed as group-led Location manager Performed Yes   VAD Patient? No     Pain Assessment   Currently in Pain? No/denies   Multiple Pain Sites No         History  Smoking Status  . Former Smoker  Smokeless Tobacco  . Not on file    Goals Met:  Proper associated with RPD/PD & O2 Sat Independence with exercise equipment Exercise tolerated well Strength training completed today  Goals Unmet:  Not Applicable  Comments: Pt able to follow exercise prescription today without complaint.  Will continue to monitor for progression.    Dr. Emily Filbert is Medical Director for [place] and [facility].

## 2016-07-11 ENCOUNTER — Encounter: Payer: Medicare Other | Attending: Specialist | Admitting: *Deleted

## 2016-07-11 DIAGNOSIS — J449 Chronic obstructive pulmonary disease, unspecified: Secondary | ICD-10-CM | POA: Diagnosis present

## 2016-07-11 NOTE — Progress Notes (Signed)
Daily Session Note  Patient Details  Name: Melvin Brewer MRN: [id number] Date of Birth: [birth year] Referring Provider:    Encounter Date: 07/11/2016  Check In:     Session Check In - 07/11/16 1212      Check-In   Location [facility]   Staff Present Earlean Shawl, BS, ACSM CEP, Exercise Physiologist;Laureen Owens Shark, BS, RRT, Respiratory Dareen Piano, BA, ACSM CEP, Exercise Physiologist   Supervising physician immediately available to respond to emergencies [facility] immediately available ER MD   Physician(s) Quentin Cornwall and Jimmye Norman    Medication changes reported     No   Fall or balance concerns reported    No   Warm-up and Cool-down Performed as group-led instruction   Resistance Training Performed Yes   VAD Patient? No     Pain Assessment   Currently in Pain? No/denies   Multiple Pain Sites No         History  Smoking Status  . Former Smoker  Smokeless Tobacco  . Not on file    Goals Met:  Proper associated with RPD/PD & O2 Sat Independence with exercise equipment Exercise tolerated well Strength training completed today  Goals Unmet:  Not Applicable  Comments: Pt able to follow exercise prescription today without complaint.  Will continue to monitor for progression.    Dr. Emily Filbert is Medical Director for [facility] and [facility].

## 2016-07-13 DIAGNOSIS — J449 Chronic obstructive pulmonary disease, unspecified: Secondary | ICD-10-CM

## 2016-07-13 NOTE — Progress Notes (Signed)
Daily Session Note  Patient Details  Name: Melvin Brewer MRN: [id number] Date of Birth: 10-07-1950 Referring Provider:    Encounter Date: 07/13/2016  Check In:     Session Check In - 07/13/16 1002      Check-In   Location [facility]   Staff Present Alberteen Sam, MA, ACSM RCEP, Exercise Physiologist;Laureen Owens Shark, BS, RRT, Respiratory Dareen Piano, BA, ACSM CEP, Exercise Physiologist   Supervising physician immediately available to respond to emergencies [facility] immediately available ER MD   Physician(s) Drs. Lord and [place]   Medication changes reported     No   Fall or balance concerns reported    No   Warm-up and Cool-down Performed on first and last piece of equipment   Resistance Training Performed Yes   VAD Patient? No     Pain Assessment   Currently in Pain? No/denies   Multiple Pain Sites No         History  Smoking Status  . Former Smoker  Smokeless Tobacco  . Not on file    Goals Met:  Proper associated with RPD/PD & O2 Sat Independence with exercise equipment Exercise tolerated well Strength training completed today  Goals Unmet:  Not Applicable  Comments: Pt able to follow exercise prescription today without complaint.  Will continue to monitor for progression.    Dr. Emily Filbert is Medical Director for [place] and [facility].

## 2016-07-15 ENCOUNTER — Encounter: Payer: Medicare Other | Admitting: *Deleted

## 2016-07-15 DIAGNOSIS — J449 Chronic obstructive pulmonary disease, unspecified: Secondary | ICD-10-CM | POA: Diagnosis not present

## 2016-07-15 NOTE — Progress Notes (Signed)
Daily Session Note  Patient Details  Name: DENNYS GUIN MRN: [id number] Date of Birth: 13-Jan-1951 Referring Provider:    Encounter Date: 07/15/2016  Check In:     Session Check In - 07/15/16 1112      Check-In   Location [facility]   Staff Present Gerlene Burdock, RN, Vickki Hearing, BA, ACSM CEP, Exercise Physiologist;Other  Darel Hong, RN   Supervising physician immediately available to respond to emergencies [facility] immediately available ER MD   Physician(s) Dr. Corky Downs and Dr Mable Paris   Medication changes reported     No   Fall or balance concerns reported    No   Warm-up and Cool-down Performed on first and last piece of equipment   Resistance Training Performed Yes   VAD Patient? No     Pain Assessment   Currently in Pain? No/denies         History  Smoking Status  . Former Smoker  Smokeless Tobacco  . Not on file    Goals Met:  Proper associated with RPD/PD & O2 Sat Exercise tolerated well  Goals Unmet:  Not Applicable  Comments:     Dr. Emily Filbert is Medical Director for [place] and [facility].

## 2016-07-18 ENCOUNTER — Encounter: Payer: Medicare Other | Admitting: *Deleted

## 2016-07-18 ENCOUNTER — Encounter: Payer: Self-pay | Admitting: Respiratory Therapy

## 2016-07-18 DIAGNOSIS — J449 Chronic obstructive pulmonary disease, unspecified: Secondary | ICD-10-CM

## 2016-07-18 NOTE — Progress Notes (Signed)
Pulmonary Individual Treatment Plan  Patient Details  Name: Melvin Brewer MRN: [id number] Date of Birth: March 28, 1951 Referring Provider:    Initial Encounter Date:    Pulmonary Rehab from 04/12/2016 in [facility] Cardiac and Pulmonary Rehab  Date  04/12/16      Visit Diagnosis: COPD, moderate ([place])  Patient's Home Medications on Admission:  Current Outpatient Prescriptions:    albuterol (PROVENTIL HFA;VENTOLIN HFA) 108 (90 BASE) MCG/ACT inhaler, Inhale 2 puffs into the lungs every 6 (six) hours as needed for wheezing or shortness of breath., Disp: , Rfl:    amitriptyline (ELAVIL) 50 MG tablet, Take 50 mg by mouth at bedtime., Disp: , Rfl:    aspirin 325 MG EC tablet, Take 325 mg by mouth daily., Disp: , Rfl:    atorvastatin (LIPITOR) 80 MG tablet, Take 80 mg by mouth daily., Disp: , Rfl:    diazepam (VALIUM) 5 MG tablet, Take 1 tablet (5 mg total) by mouth every 6 (six) hours as needed for muscle spasms., Disp: 50 tablet, Rfl: 0   docusate sodium 100 MG CAPS, Take 100 mg by mouth 2 (two) times daily., Disp: 60 capsule, Rfl: 0   fluticasone (FLONASE) 50 MCG/ACT nasal spray, Place 1 spray into both nostrils daily., Disp: , Rfl:    gabapentin (NEURONTIN) 300 MG capsule, Take 300 mg by mouth 3 (three) times daily., Disp: , Rfl:    metoprolol (LOPRESSOR) 50 MG tablet, Take 50 mg by mouth 2 (two) times daily., Disp: , Rfl:    Olodaterol HCl (STRIVERDI RESPIMAT) 2.5 MCG/ACT AERS, Inhale 1 puff into the lungs 2 (two) times daily., Disp: , Rfl:    oxyCODONE-acetaminophen (PERCOCET/ROXICET) 5-325 MG per tablet, Take 1-2 tablets by mouth every 4 (four) hours as needed for moderate pain., Disp: 100 tablet, Rfl: 0   theophylline (THEODUR) 200 MG 12 hr tablet, Take 200 mg by mouth 2 (two) times daily., Disp: , Rfl:    tiotropium (SPIRIVA) 18 MCG inhalation capsule, Place 18 mcg into inhaler and inhale daily., Disp: , Rfl:   Past Medical History: Past Medical History:  Diagnosis Date    COPD (chronic obstructive pulmonary disease)    Coronary artery disease    H/O hiatal hernia    AGE 66 S  NO MEDS   Shortness of breath    W/ EXERTION     Tobacco Use: History  Smoking Status   Former Smoker  Smokeless Tobacco   Not on file    Labs: Recent Review Flowsheet Data    There is no flowsheet data to display.       ADL UCSD:     Pulmonary Assessment Scores    Row Name 04/12/16 1159 05/25/16 1502 07/11/16 1223     ADL UCSD   ADL Phase Entry Mid Exit   SOB Score total 9 30 12    Rest 0 0 0   Walk 0 0 0   Stairs 2 3 1    Bath 1 1 0   Dress 1 1 0   Shop 0 1 0      Pulmonary Function Assessment:     Pulmonary Function Assessment - 04/12/16 1157      Initial Spirometry Results   FVC% 31 %   FEV1% 28 %   FEV1/FVC Ratio 65.82   Comments Test date - 04/12/16     Post Bronchodilator Spirometry Results   FVC% 29 %   FEV1% 24 %   FEV1/FVC Ratio 68.23     Breath  Bilateral Breath Sounds Clear;Decreased   Shortness of Breath Yes;Limiting activity      Exercise Target Goals:    Exercise Program Goal: Individual exercise prescription set with THRR, safety & activity barriers. Participant demonstrates ability to understand and report RPE using BORG scale, to self-measure pulse accurately, and to acknowledge the importance of the exercise prescription.  Exercise Prescription Goal: Starting with aerobic activity 30 plus minutes a day, 3 days per week for initial exercise prescription. Provide home exercise prescription and guidelines that participant acknowledges understanding prior to discharge.  Activity Barriers & Risk Stratification:     Activity Barriers & Cardiac Risk Stratification - 04/12/16 1157      Activity Barriers & Cardiac Risk Stratification   Activity Barriers Deconditioning;Muscular Weakness;Shortness of Breath;Arthritis   Cardiac Risk Stratification Moderate      6 Minute Walk:     6 Minute Walk    Row Name 04/12/16  1246 05/25/16 1137 07/06/16 1217     6 Minute Walk   Phase  -- Mid Program Discharge   Distance 1340 feet 1516 feet 1700 feet   Distance % Change  -- 13 % 26 %   Walk Time 6 minutes 6 minutes 6 minutes   # of Rest Breaks 0 0 0   MPH 2.54 2.87 3.21   METS 2.91 4.01 3.55   RPE 11 13 14    Perceived Dyspnea  2 2 2    VO2 Peak 9.46 14.03 12.44   Symptoms No No Yes (comment)   Comments  --  -- calves burning 6/10   Resting HR 73 bpm 68 bpm 64 bpm   Resting BP 130/68 112/64 124/64   Max Ex. HR 84 bpm 104 bpm 94 bpm   Max Ex. BP 138/68 144/76 126/74     Interval HR   Baseline HR 73 68 64   1 Minute HR 77 93 87   2 Minute HR 82 95 97   3 Minute HR 83  --  --   4 Minute HR 84 96  --   5 Minute HR 84 98  --   6 Minute HR 84 104 94   2 Minute Post HR  -- 86 80   Interval Heart Rate? Yes  -- Yes     Interval Oxygen   Interval Oxygen? Yes  -- Yes   Baseline Oxygen Saturation % 93 % 96 % 93 %   1 Minute Oxygen Saturation % 95 % 95 % 95 %   2 Minute Oxygen Saturation % 92 % 94 % 93 %   3 Minute Oxygen Saturation % 94 % 94 %  --   4 Minute Oxygen Saturation % 96 % 95 %  --   5 Minute Oxygen Saturation % 95 % 94 % 93 %   6 Minute Oxygen Saturation % 97 % 93 % 93 %   2 Minute Post Oxygen Saturation %  -- 96 % 95 %     Oxygen Initial Assessment:     Oxygen Initial Assessment - 07/12/16 0647      Home Oxygen   Home Oxygen Device None      Oxygen Re-Evaluation:   Oxygen Discharge (Final Oxygen Re-Evaluation):   Initial Exercise Prescription:     Initial Exercise Prescription - 04/12/16 1200      Date of Initial Exercise RX and Referring Provider   Date 04/12/16     Treadmill   MPH 2.3   Grade 0.5   Minutes 15  METs 2.9     Recumbant Bike   Level 3   RPM 60   Minutes 15   METs 2.9     Elliptical   Level 1   Speed 2.5   Minutes 15  3/3/3   METs 2.9     REL-XR   Level 4   Minutes 15   METs 2.9     T5 Nustep   Level 3   Minutes 15   METs 2.9      Prescription Details   Frequency (times per week) 3   Duration Progress to 45 minutes of aerobic exercise without signs/symptoms of physical distress     Intensity   THRR 40-80% of Max Heartrate 105-137   Ratings of Perceived Exertion 11-15   Perceived Dyspnea 2-4     Progression   Progression Continue to progress workloads to maintain intensity without signs/symptoms of physical distress.     Resistance Training   Training Prescription Yes   Weight 3   Reps 10-15      Perform Capillary Blood Glucose checks as needed.  Exercise Prescription Changes:     Exercise Prescription Changes    Row Name 04/18/16 1100 04/26/16 1600 05/12/16 1300 05/18/16 1100 05/26/16 1100     Response to Exercise   Blood Pressure (Admit) 120/70 122/80 142/70  -- 112/64   Blood Pressure (Exercise) 148/84 178/88 198/80  -- 172/66   Blood Pressure (Exit) 114/68 110/72 126/70  -- 112/66   Heart Rate (Admit) 67 bpm 72 bpm 66 bpm  -- 68 bpm   Heart Rate (Exercise) 111 bpm 123 bpm 115 bpm  -- 101 bpm   Heart Rate (Exit) 90 bpm 80 bpm 83 bpm  -- 76 bpm   Oxygen Saturation (Admit) 94 % 94 % 96 %  -- 96 %   Oxygen Saturation (Exercise) 91 % 92 % 92 %  -- 94 %   Oxygen Saturation (Exit) 92 % 94 % 93 %  -- 95 %   Rating of Perceived Exertion (Exercise) 15 15 14   -- 14   Perceived Dyspnea (Exercise) 3 3 3   -- 2   Comments  --  --  -- Home Exercise Guidelines given 05/18/16  --   Duration Progress to 45 minutes of aerobic exercise without signs/symptoms of physical distress Progress to 45 minutes of aerobic exercise without signs/symptoms of physical distress Progress to 45 minutes of aerobic exercise without signs/symptoms of physical distress Progress to 45 minutes of aerobic exercise without signs/symptoms of physical distress Progress to 45 minutes of aerobic exercise without signs/symptoms of physical distress   Intensity THRR unchanged THRR unchanged THRR unchanged THRR unchanged THRR unchanged      Progression   Progression Continue to progress workloads to maintain intensity without signs/symptoms of physical distress. Continue to progress workloads to maintain intensity without signs/symptoms of physical distress. Continue to progress workloads to maintain intensity without signs/symptoms of physical distress. Continue to progress workloads to maintain intensity without signs/symptoms of physical distress. Continue to progress workloads to maintain intensity without signs/symptoms of physical distress.     Resistance Training   Training Prescription Yes Yes Yes Yes Yes   Weight 3 3 3 3 4    Reps 10-15 10-15 10-15 10-15 10-15     Interval Training   Interval Training No No No No No     Treadmill   MPH 2.3 2.3 2.3 2.3  --   Grade 0.5 0.5 0.5 0.5  --   Minutes  15 15 15 15   --   METs 2.9 2.9 2.9 2.9  --     Recumbant Bike   Level 3  --  --  -- 7   RPM 50  --  --  --  --   Minutes 15  --  --  -- 15   METs 2.5  --  --  -- 5.6     Elliptical   Level 1 1 1 1 1    Speed  -- 3.5  --  --  --   Minutes 10  1/1/1  -- 12  4/4/4 12  4/4/4 9     Home Exercise Plan   Plans to continue exercise at  --  --  -- Home  walking and [employer]  --   Frequency  --  --  -- Add 2 additional days to program exercise sessions.  --   Row Name 06/09/16 1100 06/24/16 1200 07/06/16 1200 07/18/16 0700       Response to Exercise   Blood Pressure (Admit) 118/70 128/80 124/64 124/64    Blood Pressure (Exercise) 166/80 142/70 126/74 126/74    Blood Pressure (Exit) 136/66 118/78 118/68 118/68    Heart Rate (Admit) 65 bpm 56 bpm 64 bpm 64 bpm    Heart Rate (Exercise) 116 bpm 87 bpm 132 bpm 132 bpm    Heart Rate (Exit) 75 bpm 65 bpm 72 bpm 72 bpm    Oxygen Saturation (Admit) 96 % 95 % 93 % 93 %    Oxygen Saturation (Exercise) 93 % 93 % 91 % 91 %    Oxygen Saturation (Exit) 94 % 95 % 95 % 95 %    Rating of Perceived Exertion (Exercise) 15 12 14 14     Perceived Dyspnea (Exercise) 3 1 2 2     Duration  Progress to 45 minutes of aerobic exercise without signs/symptoms of physical distress Continue with 45 min of aerobic exercise without signs/symptoms of physical distress. Continue with 45 min of aerobic exercise without signs/symptoms of physical distress. Continue with 45 min of aerobic exercise without signs/symptoms of physical distress.    Intensity THRR unchanged THRR unchanged THRR unchanged THRR unchanged      Progression   Progression Continue to progress workloads to maintain intensity without signs/symptoms of physical distress. Continue to progress workloads to maintain intensity without signs/symptoms of physical distress. Continue to progress workloads to maintain intensity without signs/symptoms of physical distress. Continue to progress workloads to maintain intensity without signs/symptoms of physical distress.      Resistance Training   Training Prescription Yes Yes Yes Yes    Weight 4 4 4 4     Reps 10-15 10-15 10-15 10-15      Interval Training   Interval Training No Yes Yes Yes      Treadmill   MPH  -- 2.4 2.4 2.4    Grade  -- 1.5 1.5 1.5    Minutes  -- 15 15 15     METs  -- 3.33 3.33 3.33      NuStep   Level 5 5 6 6     SPM  -- 76 84 84    Minutes 15 15 15 15     METs 2.4 2.8 3.5 3.5      Elliptical   Level 1  --  -- 1    Minutes 15  --  -- 15       Exercise Comments:     Exercise Comments    Row Name 04/18/16  1045 04/26/16 1642 05/12/16 1320 05/18/16 1155 05/26/16 1130   Exercise Comments First full day of exercise!  Patient was oriented to gym and equipment including functions, settings, policies, and procedures.  Patient's individual exercise prescription and treatment plan were reviewed.  All starting workloads were established based on the results of the 6 minute walk test done at initial orientation visit.  The plan for exercise progression was also introduced and progression will be customized based on patient's performance and goals. Chrishawn is  progressing well with exercise. Nhat is able to do a greater length of time and greater total time on the elliptical. Reviewed home exercise with pt today.  Pt plans to use [employer] and walk at home for exercise.  Reviewed THR, pulse, RPE, sign and symptoms, NTG use, and when to call 911 or MD.  Also discussed weather considerations and indoor options.  Pt voiced understanding. Deaunte continues to increase duration on ellitical and improved 176 feet on 6 min walk.   [place] Name 06/09/16 1137 06/24/16 1208 07/06/16 1220 07/18/16 1251     Exercise Comments Berneta Sages achieved his goal of 15 minutes on the Elliptical! Javen has increased speed to 2.4 and 1.5 % incline on TM. Joevanni improved his 6 min walk by 26%! Jeren graduated today from cardiac rehab with 36 sessions completed.  Details of the patient's exercise prescription and what He needs to do in order to continue the prescription and progress were discussed with patient.  Patient was given a copy of prescription and goals.  Patient verbalized understanding.  Jenna plans to continue to exercise by by going to a community fitness center and walking at home.       Exercise Goals and Review:   Exercise Goals Re-Evaluation :   Discharge Exercise Prescription (Final Exercise Prescription Changes):     Exercise Prescription Changes - 07/18/16 0700      Response to Exercise   Blood Pressure (Admit) 124/64   Blood Pressure (Exercise) 126/74   Blood Pressure (Exit) 118/68   Heart Rate (Admit) 64 bpm   Heart Rate (Exercise) 132 bpm   Heart Rate (Exit) 72 bpm   Oxygen Saturation (Admit) 93 %   Oxygen Saturation (Exercise) 91 %   Oxygen Saturation (Exit) 95 %   Rating of Perceived Exertion (Exercise) 14   Perceived Dyspnea (Exercise) 2   Duration Continue with 45 min of aerobic exercise without signs/symptoms of physical distress.   Intensity THRR unchanged     Progression   Progression Continue to progress workloads to maintain intensity  without signs/symptoms of physical distress.     Resistance Training   Training Prescription Yes   Weight 4   Reps 10-15     Interval Training   Interval Training Yes     Treadmill   MPH 2.4   Grade 1.5   Minutes 15   METs 3.33     NuStep   Level 6   SPM 84   Minutes 15   METs 3.5     Elliptical   Level 1   Minutes 15      Nutrition:  Target Goals: Understanding of nutrition guidelines, daily intake of sodium <1564m, cholesterol <2038m calories 30% from fat and 7% or less from saturated fats, daily to have 5 or more servings of fruits and vegetables.  Biometrics:     Pre Biometrics - 04/12/16 1245      Pre Biometrics   Height 5' 10"  (1.778 m)   Weight 219 lb  6.4 oz (99.5 kg)   Waist Circumference 48.5 inches   Hip Circumference 45 inches   Waist to Hip Ratio 1.08 %   BMI (Calculated) 31.5         Post Biometrics - 07/06/16 1216       Post  Biometrics   Height 5' 10"  (1.778 m)   Weight 209 lb 3.2 oz (94.9 kg)   Waist Circumference 45 inches   Hip Circumference 40 inches   Waist to Hip Ratio 1.12 %   BMI (Calculated) 30.1      Nutrition Therapy Plan and Nutrition Goals:     Nutrition Therapy & Goals - 06/01/16 1705      Nutrition Therapy   Diet DASH   Drug/Food Interactions Statins/Certain Fruits   Protein (specify units) 12oz   Fiber 25 grams   Whole Grain Foods 3 servings   Saturated Fats 14 max. grams   Fruits and Vegetables 8 servings/day   Sodium 1500 grams     Personal Nutrition Goals   Nutrition Goal Gradually increase healthy carbs; aim for 9 servings daily, and work towards 12 servings daily.   Personal Goal #2 Aim for 1200 calories, gradually working up to 1700-1800calories over the next several months.    Comments Patient has been following very low-carb diet for weight loss. He has lost 14lbs over the past several weeks. He is tracking his carb intake using a phone app, and is ranging from 12-60g. carbohydrate daily. Minimum  needs = 130g daily. Advised him to increase carb and calorie intake slowly to allow for ongoing healthy weight loss.      Intervention Plan   Intervention Prescribe, educate and counsel regarding individualized specific dietary modifications aiming towards targeted core components such as weight, hypertension, lipid management, diabetes, heart failure and other comorbidities.;Nutrition handout(s) given to patient.   Expected Outcomes Short Term Goal: Understand basic principles of dietary content, such as calories, fat, sodium, cholesterol and nutrients.;Short Term Goal: A plan has been developed with personal nutrition goals set during dietitian appointment.;Long Term Goal: Adherence to prescribed nutrition plan.      Nutrition Discharge: Rate Your Plate Scores:   Nutrition Goals Re-Evaluation:   Nutrition Goals Discharge (Final Nutrition Goals Re-Evaluation):   Psychosocial: Target Goals: Acknowledge presence or absence of significant depression and/or stress, maximize coping skills, provide positive support system. Participant is able to verbalize types and ability to use techniques and skills needed for reducing stress and depression.   Initial Review & Psychosocial Screening:     Initial Psych Review & Screening - 04/12/16 [place]? Yes   Comments Mr Ertl has good support from his wife and son. He has his [place] faith and no depression. Mr Nohr is looking forward to participating in [place], getting back to exercise and weight loss.     Barriers   Psychosocial barriers to participate in program The patient should benefit from training in stress management and relaxation.     Screening Interventions   Interventions Encouraged to exercise      Quality of Life Scores:     Quality of Life - 07/11/16 1224      Quality of Life Scores   Health/Function Pre 20.73 %   Health/Function Post 26.13 %   Health/Function % Change  26.05 %   Socioeconomic Pre 20.29 %   Socioeconomic Post 27.88 %   Socioeconomic % Change  37.41 %   Psych/Spiritual Pre  21 %   Psych/Spiritual Post 30 %   Psych/Spiritual % Change 42.86 %   Family Pre 21 %   Family Post 30 %   Family % Change 42.86 %   GLOBAL Pre 20.74 %   GLOBAL Post 27.8 %   GLOBAL % Change 34.04 %      PHQ-9: Recent Review Flowsheet Data    Depression screen [facility] 2/9 07/11/2016 04/12/2016   Decreased Interest 0 0   Down, Depressed, Hopeless 0 0   PHQ - 2 Score 0 0   Altered sleeping 0 1   Tired, decreased energy 0 0   Change in appetite 0 1   Feeling bad or failure about yourself  0 0   Trouble concentrating 0 0   Moving slowly or fidgety/restless 0 0   Suicidal thoughts 0 0   PHQ-9 Score 0 2   Difficult doing work/chores Not difficult at all Not difficult at all     Interpretation of Total Score  Total Score Depression Severity:  1-4 = Minimal depression, 5-9 = Mild depression, 10-14 = Moderate depression, 15-19 = Moderately severe depression, 20-27 = Severe depression   Psychosocial Evaluation and Intervention:     Psychosocial Evaluation - 07/06/16 1025      Discharge Psychosocial Assessment & Intervention   Comments Counselor met with Berneta Sages today for his discharge evaluation.  He has just a few more sessions and he will be done.  He continues to experience progress as he continues to build strength and stamina.  He has lost weight and inches.  He has learned a great deal in the educational components and he is breathing better overall.  Thurlow states his mood continues to be positive and he has minimal stress in his life.  He plans to return to the gym or walk with his spouse for maintenance upon completion of this program.  Counselor commended Jairon for his progress and success in this program and encouraged him to exercise consistently on his own.        Psychosocial Re-Evaluation:     Psychosocial Re-Evaluation    [place] Name 05/11/16 1542  06/08/16 1010           Psychosocial Re-Evaluation   Comments Berneta Sages psychosocial assessment reveals no barriers at this time to participation in Pulmonary Rehab.  He  has good family and friend support that encourages Charistopher to participate in [place] and progress with His goals.  Pradeep concerns are monitored, but he  has acknowledge that attending the program has helped to maintain quality life with improved mobility, self-care, and emotional and financial stability.  Jones is commended for regular attendance and self-motivation to improve His pulmonary disease management. Counselor follow up with Berneta Sages today.  He reports experiencing some definite progress in his goals to increase his stamina citing his first day in this program he had to stop on the elliptical after each minute to rest and now is going 12 straight minutes without stopping.  His support system continues to be strong and he is learning what he is able to do to improve his breathing and quality of life.  He continues to sleep well with the use of Melatonin at night.  Counselor commended Terell on his progress made and commitment to consistent exercise and positive self-care.       Expected Outcomes  --  Ehsan will continue to participate regularly in the Pulmonary rehab program and work toward his goals.  He has seen & reported progress  and his goals will continue to be monitored by staff.            Psychosocial Discharge (Final Psychosocial Re-Evaluation):     Psychosocial Re-Evaluation - 06/08/16 1010      Psychosocial Re-Evaluation   Comments Counselor follow up with Berneta Sages today.  He reports experiencing some definite progress in his goals to increase his stamina citing his first day in this program he had to stop on the elliptical after each minute to rest and now is going 12 straight minutes without stopping.  His support system continues to be strong and he is learning what he is able to do to improve his breathing and  quality of life.  He continues to sleep well with the use of Melatonin at night.  Counselor commended Tiffany on his progress made and commitment to consistent exercise and positive self-care.    Expected Outcomes  Wendy will continue to participate regularly in the Pulmonary rehab program and work toward his goals.  He has seen & reported progress and his goals will continue to be monitored by staff.         Education: Education Goals: Education classes will be provided on a weekly basis, covering required topics. Participant will state understanding/return demonstration of topics presented.  Learning Barriers/Preferences:     Learning Barriers/Preferences - 04/12/16 1157      Learning Barriers/Preferences   Learning Barriers None   Learning Preferences None      Education Topics: Initial Evaluation Education: - Verbal, written and demonstration of respiratory meds, RPE/PD scales, oximetry and breathing techniques. Instruction on use of nebulizers and MDIs: cleaning and proper use, rinsing mouth with steroid doses and importance of monitoring MDI activations.   Pulmonary Rehab from 07/15/2016 in [facility] Cardiac and Pulmonary Rehab  Date  04/12/16  Educator  LB  Instruction Review Code  2- meets goals/outcomes      General Nutrition Guidelines/Fats and Fiber: -Group instruction provided by verbal, written material, models and posters to present the general guidelines for heart healthy nutrition. Gives an explanation and review of dietary fats and fiber.   Pulmonary Rehab from 07/15/2016 in [facility] Cardiac and Pulmonary Rehab  Date  04/25/16  Educator  CR  Instruction Review Code  2- meets goals/outcomes      Controlling Sodium/Reading Food Labels: -Group verbal and written material supporting the discussion of sodium use in heart healthy nutrition. Review and explanation with models, verbal and written materials for utilization of the food label.   Pulmonary Rehab from 07/15/2016 in [facility]  Cardiac and Pulmonary Rehab  Date  05/02/16  Educator  CR  Instruction Review Code  2- meets goals/outcomes      Exercise Physiology & Risk Factors: - Group verbal and written instruction with models to review the exercise physiology of the cardiovascular system and associated critical values. Details cardiovascular disease risk factors and the goals associated with each risk factor.   Pulmonary Rehab from 07/15/2016 in [facility] Cardiac and Pulmonary Rehab  Date  07/15/16  Educator  Nada Maclachlan, EP  Instruction Review Code  2- meets goals/outcomes      Aerobic Exercise & Resistance Training: - Gives group verbal and written discussion on the health impact of inactivity. On the components of aerobic and resistive training programs and the benefits of this training and how to safely progress through these programs.   Pulmonary Rehab from 07/15/2016 in [facility] Cardiac and Pulmonary Rehab  Date  05/11/16  Educator  [facility]  Instruction Review  Code  2- meets goals/outcomes      Flexibility, Balance, General Exercise Guidelines: - Provides group verbal and written instruction on the benefits of flexibility and balance training programs. Provides general exercise guidelines with specific guidelines to those with heart or lung disease. Demonstration and skill practice provided.   Pulmonary Rehab from 07/15/2016 in [facility] Cardiac and Pulmonary Rehab  Date  06/03/16  Educator  Nada Maclachlan  Instruction Review Code  2- meets goals/outcomes      Stress Management: - Provides group verbal and written instruction about the health risks of elevated stress, cause of high stress, and healthy ways to reduce stress.   Pulmonary Rehab from 07/15/2016 in [facility] Cardiac and Pulmonary Rehab  Date  07/13/16  Educator  [facility]  Instruction Review Code  2- meets goals/outcomes      Depression: - Provides group verbal and written instruction on the correlation between heart/lung disease and depressed mood, treatment options,  and the stigmas associated with seeking treatment.   Pulmonary Rehab from 07/15/2016 in [facility] Cardiac and Pulmonary Rehab  Date  06/15/16  Educator  [facility]  Instruction Review Code  2- meets goals/outcomes      Exercise & Equipment Safety: - Individual verbal instruction and demonstration of equipment use and safety with use of the equipment.   Pulmonary Rehab from 07/15/2016 in [facility] Cardiac and Pulmonary Rehab  Date  04/18/16  Educator  [facility]  Instruction Review Code  2- meets goals/outcomes      Infection Prevention: - Provides verbal and written material to individual with discussion of infection control including proper hand washing and proper equipment cleaning during exercise session.   Pulmonary Rehab from 07/15/2016 in [facility] Cardiac and Pulmonary Rehab  Date  04/18/16  Educator  [facility]  Instruction Review Code  2- meets goals/outcomes      Falls Prevention: - Provides verbal and written material to individual with discussion of falls prevention and safety.   Pulmonary Rehab from 07/15/2016 in [facility] Cardiac and Pulmonary Rehab  Date  04/12/16  Educator  LB  Instruction Review Code  2- meets goals/outcomes      Diabetes: - Individual verbal and written instruction to review signs/symptoms of diabetes, desired ranges of glucose level fasting, after meals and with exercise. Advice that pre and post exercise glucose checks will be done for 3 sessions at entry of program.   Chronic Lung Diseases: - Group verbal and written instruction to review new updates, new respiratory medications, new advancements in procedures and treatments. Provide informative websites and "800" numbers of self-education.   Pulmonary Rehab from 07/15/2016 in [facility] Cardiac and Pulmonary Rehab  Date  05/04/16  Educator  LB  Instruction Review Code  2- meets goals/outcomes      Lung Procedures: - Group verbal and written instruction to describe testing methods done to diagnose lung disease. Review the outcome of test  results. Describe the treatment choices: Pulmonary Function Tests, ABGs and oximetry.   Energy Conservation: - Provide group verbal and written instruction for methods to conserve energy, plan and organize activities. Instruct on pacing techniques, use of adaptive equipment and posture/positioning to relieve shortness of breath.   Pulmonary Rehab from 07/15/2016 in [facility] Cardiac and Pulmonary Rehab  Date  06/08/16  Educator  [facility]  Instruction Review Code  2- meets goals/outcomes      Triggers: - Group verbal and written instruction to review types of environmental controls: home humidity, furnaces, filters, dust mite/pet prevention, HEPA vacuums. To discuss weather changes, air  quality and the benefits of nasal washing.   Pulmonary Rehab from 07/15/2016 in [facility] Cardiac and Pulmonary Rehab  Date  06/01/16  Educator  LB  Instruction Review Code  2- meets goals/outcomes      Exacerbations: - Group verbal and written instruction to provide: warning signs, infection symptoms, calling MD promptly, preventive modes, and value of vaccinations. Review: effective airway clearance, coughing and/or vibration techniques. Create an Sports administrator.   Pulmonary Rehab from 07/15/2016 in [facility] Cardiac and Pulmonary Rehab  Date  05/25/16  Educator  LB  Instruction Review Code  2- meets goals/outcomes      Oxygen: - Individual and group verbal and written instruction on oxygen therapy. Includes supplement oxygen, available portable oxygen systems, continuous and intermittent flow rates, oxygen safety, concentrators, and Medicare reimbursement for oxygen.   Respiratory Medications: - Group verbal and written instruction to review medications for lung disease. Drug class, frequency, complications, importance of spacers, rinsing mouth after steroid MDI's, and proper cleaning methods for nebulizers.   Pulmonary Rehab from 07/15/2016 in [facility] Cardiac and Pulmonary Rehab  Date  04/12/16  Educator  LB  Instruction  Review Code  2- meets goals/outcomes      AED/CPR: - Group verbal and written instruction with the use of models to demonstrate the basic use of the AED with the basic ABC's of resuscitation.   Breathing Retraining: - Provides individuals verbal and written instruction on purpose, frequency, and proper technique of diaphragmatic breathing and pursed-lipped breathing. Applies individual practice skills.   Pulmonary Rehab from 07/15/2016 in [facility] Cardiac and Pulmonary Rehab  Date  04/18/16  Educator  [facility]  Instruction Review Code  2- meets goals/outcomes      Anatomy and Physiology of the Lungs: - Group verbal and written instruction with the use of models to provide basic lung anatomy and physiology related to function, structure and complications of lung disease.   Pulmonary Rehab from 07/15/2016 in [facility] Cardiac and Pulmonary Rehab  Date  07/01/16  Educator  LB  Instruction Review Code  2- meets goals/outcomes      Heart Failure: - Group verbal and written instruction on the basics of heart failure: signs/symptoms, treatments, explanation of ejection fraction, enlarged heart and cardiomyopathy.   Pulmonary Rehab from 07/15/2016 in [facility] Cardiac and Pulmonary Rehab  Date  05/27/16  Educator  CE  Instruction Review Code  2- meets goals/outcomes      Sleep Apnea: - Individual verbal and written instruction to review Obstructive Sleep Apnea. Review of risk factors, methods for diagnosing and types of masks and machines for OSA.   Anxiety: - Provides group, verbal and written instruction on the correlation between heart/lung disease and anxiety, treatment options, and management of anxiety.   Pulmonary Rehab from 07/15/2016 in [facility] Cardiac and Pulmonary Rehab  Date  07/13/16  Educator  [facility]  Instruction Review Code  2- Meets goals/outcomes      Relaxation: - Provides group, verbal and written instruction about the benefits of relaxation for patients with heart/lung disease. Also  provides patients with examples of relaxation techniques.   Pulmonary Rehab from 07/15/2016 in [facility] Cardiac and Pulmonary Rehab  Date  05/18/16  Educator  [facility]  Instruction Review Code  2- Meets goals/outcomes      Knowledge Questionnaire Score:     Knowledge Questionnaire Score - 07/11/16 1224      Knowledge Questionnaire Score   Pre Score 6/10   Post Score 9/10       Core Components/Risk  Factors/Patient Goals at Admission:     Personal Goals and Risk Factors at Admission - 04/12/16 1201      Core Components/Risk Factors/Patient Goals on Admission    Weight Management Yes;Weight Loss   Intervention Weight Management: Provide education and appropriate resources to help participant work on and attain dietary goals.   Admit Weight 223 lb (101.2 kg)   Goal Weight: Short Term 198 lb (89.8 kg)   Goal Weight: Long Term 180 lb (81.6 kg)   Expected Outcomes Short Term: Continue to assess and modify interventions until short term weight is achieved;Long Term: Adherence to nutrition and physical activity/exercise program aimed toward attainment of established weight goal;Weight Loss: Understanding of general recommendations for a balanced deficit meal plan, which promotes 1-2 lb weight loss per week and includes a negative energy balance of [phone] kcal/d;Understanding recommendations for meals to include 15-35% energy as protein, 25-35% energy from fat, 35-60% energy from carbohydrates, less than '200mg'$  of dietary cholesterol, 20-35 gm of total fiber daily;Understanding of distribution of calorie intake throughout the day with the consumption of 4-5 meals/snacks   Sedentary Yes  Mr Spratt has been walking 1.3mles several days/week. He also has a [employer] services [place]   Intervention Provide advice, education, support and counseling about physical activity/exercise needs.;Develop an individualized exercise prescription for aerobic and resistive training based on initial evaluation  findings, risk stratification, comorbidities and participant's personal goals.   Expected Outcomes Achievement of increased cardiorespiratory fitness and enhanced flexibility, muscular endurance and strength shown through measurements of functional capacity and personal statement of participant.   Increase Strength and Stamina Yes   Intervention Provide advice, education, support and counseling about physical activity/exercise needs.;Develop an individualized exercise prescription for aerobic and resistive training based on initial evaluation findings, risk stratification, comorbidities and participant's personal goals.   Expected Outcomes Achievement of increased cardiorespiratory fitness and enhanced flexibility, muscular endurance and strength shown through measurements of functional capacity and personal statement of participant.   Improve shortness of breath with ADL's Yes   Intervention Provide education, individualized exercise plan and daily activity instruction to help decrease symptoms of SOB with activities of daily living.   Expected Outcomes Short Term: Achieves a reduction of symptoms when performing activities of daily living.   Develop more efficient breathing techniques such as purse lipped breathing and diaphragmatic breathing; and practicing self-pacing with activity Yes   Intervention Provide education, demonstration and support about specific breathing techniuqes utilized for more efficient breathing. Include techniques such as pursed lipped breathing, diaphragmatic breathing and self-pacing activity.   Expected Outcomes Short Term: Participant will be able to demonstrate and use breathing techniques as needed throughout daily activities.   Increase knowledge of respiratory medications and ability to use respiratory devices properly  Yes  Symbicort, ProAir, Spiriva, Spacer given with instructions; Theophylline   Intervention Provide education and demonstration as needed of  appropriate use of medications, inhalers, and oxygen therapy.   Expected Outcomes Short Term: Achieves understanding of medications use. Understands that oxygen is a medication prescribed by physician. Demonstrates appropriate use of inhaler and oxygen therapy.   Hypertension Yes   Intervention Provide education on lifestyle modifcations including regular physical activity/exercise, weight management, moderate sodium restriction and increased consumption of fresh fruit, vegetables, and low fat dairy, alcohol moderation, and smoking cessation.;Monitor prescription use compliance.   Expected Outcomes Short Term: Continued assessment and intervention until BP is < 140/923mHG in hypertensive participants. < 130/8032mG in hypertensive participants with diabetes, heart failure or  chronic kidney disease.;Long Term: Maintenance of blood pressure at goal levels.   Lipids Yes   Intervention Provide education and support for participant on nutrition & aerobic/resistive exercise along with prescribed medications to achieve LDL '70mg'$ , HDL >'40mg'$ .   Expected Outcomes Short Term: Participant states understanding of desired cholesterol values and is compliant with medications prescribed. Participant is following exercise prescription and nutrition guidelines.;Long Term: Cholesterol controlled with medications as prescribed, with individualized exercise RX and with personalized nutrition plan. Value goals: LDL < '70mg'$ , HDL > 40 mg.      Core Components/Risk Factors/Patient Goals Review:      Goals and Risk Factor Review    Row Name 04/18/16 1043 04/18/16 1121 05/06/16 1139 07/12/16 0636 07/12/16 3154     Core Components/Risk Factors/Patient Goals Review   Personal Goals Review Develop more efficient breathing techniques such as purse lipped breathing and diaphragmatic breathing and practicing self-pacing with activity. Weight Management/Obesity Weight Management/Obesity;Sedentary;Increase Strength and Stamina;Other  Weight Management/Obesity;Improve shortness of breath with ADL's;Increase knowledge of respiratory medications and ability to use respiratory devices properly.;Develop more efficient breathing techniques such as purse lipped breathing and diaphragmatic breathing and practicing self-pacing with activity.;Lipids;Hypertension Weight Management/Obesity;Improve shortness of breath with ADL's;Increase knowledge of respiratory medications and ability to use respiratory devices properly.;Lipids;Hypertension;Develop more efficient breathing techniques such as purse lipped breathing and diaphragmatic breathing and practicing self-pacing with activity.   Review Pursed lip breathing was discussed and demonstrated with patient. He demonstarted understanding of this breathing technique and when to use it.  Patient stated that he was interested in meeting with the dietician. He was given the paperwork and contact information to schedule an appointment.  Trevione has had a cold and is finishing up a Zpac and prednisone.  Even with this, he has imporved his time on Elliptical from 1 - 3 1/2 minutes, and overall is down 11 1/2 lb.  -- Mr Zobrist is close to graduation. He has improved his exercise goals throughout the program. His post 49md improved by 3629f- minimal importance difference for COPD is 98.74f2fHe has met with the dietitan and has changed his eatting habits and has now lost 10lbs with inches off his waist and hips. He has a good understanding of his medications and is compliant with them. I have reviewed his MDI's  and use of spacer. Plans for future exercise includes the millinium and his neighborhood walk of 1.9m47m (now in 38mi23m Mr DickeTosiattended regularly and has enjoyed the education.    Expected Outcomes Patient will use PLB during exercise and ADL's to help control SOB and maintian an acceptable oxygen saturation.  Patient will call and make an appointment to meet with the dietician.  GeralPier  continue regular exercise and see improvement in his stamina, and will continue to be successful at weight loss.  -- Continue exercise and self managing his COPD as learned in [facility]  Core Components/Risk Factors/Patient Goals at Discharge (Final Review):      Goals and Risk Factor Review - 07/12/16 0637      Core Components/Risk Factors/Patient Goals Review   Personal Goals Review Weight Management/Obesity;Improve shortness of breath with ADL's;Increase knowledge of respiratory medications and ability to use respiratory devices properly.;Lipids;Hypertension;Develop more efficient breathing techniques such as purse lipped breathing and diaphragmatic breathing and practicing self-pacing with activity.   Review Mr DickePetroskylose to graduation. He has improved his exercise goals throughout the program. His post 6mwd 71mroved by 360ft -2fimal importance  difference for COPD is 98.81f. He has met with the dietitan and has changed his eatting habits and has now lost 10lbs with inches off his waist and hips. He has a good understanding of his medications and is compliant with them. I have reviewed his MDI's  and use of spacer. Plans for future exercise includes the millinium and his neighborhood walk of 1.837mes (now in 3864m). Mr DicHaslers attended regularly and has enjoyed the education.    Expected Outcomes Continue exercise and self managing his COPD as learned in [facility]    ITP Comments:     ITP Comments    Row Name 05/06/16 1105 05/11/16 1136 06/06/16 1555 07/05/16 0711     ITP Comments Mr. DicFacianes unable to complete his full exercise prescription today.  At end of second machine, he stopped exercising and stated that "this cold is getting the best of me".  He has had a "cold" for approximately 1 month and is currently on prednisone and other medications without improvement.  SpO2 sat WNL.  Pt was advised to call doctor and left early.  Will continue to monitor for  progression. Mr. DicPulsd his 30 face to face with Dr. MilSabra Heckday. Mr DicBurksd his 30 day face to face with Dr MilSabra Heckday. Face to face meeting with Dr MarEmily Filbertedical Director of [facility].       Comments:  GerEstephanaduated today from pulmonary rehab with 36 sessions completed.  Details of the patient's exercise prescription and what He needs to do in order to continue the prescription and progress were discussed with patient.  Patient was given a copy of prescription and goals.  Patient verbalized understanding.  GerChristans to continue to exercise [place] and [place] in his [place].

## 2016-07-18 NOTE — Patient Instructions (Signed)
Discharge Instructions  Patient Details  Name: Melvin Brewer MRN: [id number] Date of Birth: 06-15-1950 Referring Provider:  No ref. provider found   Number of Visits: 36 Reason for Discharge:  Patient reached a stable level of exercise. Patient independent in their exercise.  Smoking History:  History  Smoking Status   Former Smoker  Smokeless Tobacco   Not on file    Diagnosis:  No diagnosis found.  Initial Exercise Prescription:     Initial Exercise Prescription - 04/12/16 1200      Date of Initial Exercise RX and Referring Provider   Date 04/12/16     Treadmill   MPH 2.3   Grade 0.5   Minutes 15   METs 2.9     Recumbant Bike   Level 3   RPM 60   Minutes 15   METs 2.9     Elliptical   Level 1   Speed 2.5   Minutes 15  3/3/3   METs 2.9     REL-XR   Level 4   Minutes 15   METs 2.9     T5 Nustep   Level 3   Minutes 15   METs 2.9     Prescription Details   Frequency (times per week) 3   Duration Progress to 45 minutes of aerobic exercise without signs/symptoms of physical distress     Intensity   THRR 40-80% of Max Heartrate 105-137   Ratings of Perceived Exertion 11-15   Perceived Dyspnea 2-4     Progression   Progression Continue to progress workloads to maintain intensity without signs/symptoms of physical distress.     Resistance Training   Training Prescription Yes   Weight 3   Reps 10-15      Discharge Exercise Prescription (Final Exercise Prescription Changes):     Exercise Prescription Changes - 07/18/16 0700      Response to Exercise   Blood Pressure (Admit) 124/64   Blood Pressure (Exercise) 126/74   Blood Pressure (Exit) 118/68   Heart Rate (Admit) 64 bpm   Heart Rate (Exercise) 132 bpm   Heart Rate (Exit) 72 bpm   Oxygen Saturation (Admit) 93 %   Oxygen Saturation (Exercise) 91 %   Oxygen Saturation (Exit) 95 %   Rating of Perceived Exertion (Exercise) 14   Perceived Dyspnea (Exercise) 2   Duration  Continue with 45 min of aerobic exercise without signs/symptoms of physical distress.   Intensity THRR unchanged     Progression   Progression Continue to progress workloads to maintain intensity without signs/symptoms of physical distress.     Resistance Training   Training Prescription Yes   Weight 4   Reps 10-15     Interval Training   Interval Training Yes     Treadmill   MPH 2.4   Grade 1.5   Minutes 15   METs 3.33     NuStep   Level 6   SPM 84   Minutes 15   METs 3.5     Elliptical   Level 1   Minutes 15      Functional Capacity:     [place] Name 04/12/16 1246 05/25/16 1137 07/06/16 1217     6 Minute Walk   Phase  -- Mid Program Discharge   Distance 1340 feet 1516 feet 1700 feet   Distance % Change  -- 13 % 26 %   Walk Time 6 minutes 6 minutes 6 minutes   #  of Rest Breaks 0 0 0   MPH 2.54 2.87 3.21   METS 2.91 4.01 3.55   RPE 11 13 14    Perceived Dyspnea  2 2 2    VO2 Peak 9.46 14.03 12.44   Symptoms No No Yes (comment)   Comments  --  -- calves burning 6/10   Resting HR 73 bpm 68 bpm 64 bpm   Resting BP 130/68 112/64 124/64   Max Ex. HR 84 bpm 104 bpm 94 bpm   Max Ex. BP 138/68 144/76 126/74     Interval HR   Baseline HR 73 68 64   1 Minute HR 77 93 87   2 Minute HR 82 95 97   3 Minute HR 83  --  --   4 Minute HR 84 96  --   5 Minute HR 84 98  --   6 Minute HR 84 104 94   2 Minute Post HR  -- 86 80   Interval Heart Rate? Yes  -- Yes     Interval Oxygen   Interval Oxygen? Yes  -- Yes   Baseline Oxygen Saturation % 93 % 96 % 93 %   1 Minute Oxygen Saturation % 95 % 95 % 95 %   2 Minute Oxygen Saturation % 92 % 94 % 93 %   3 Minute Oxygen Saturation % 94 % 94 %  --   4 Minute Oxygen Saturation % 96 % 95 %  --   5 Minute Oxygen Saturation % 95 % 94 % 93 %   6 Minute Oxygen Saturation % 97 % 93 % 93 %   2 Minute Post Oxygen Saturation %  -- 96 % 95 %      Quality of Life:     Quality of Life - 07/11/16 1224       Quality of Life Scores   Health/Function Pre 20.73 %   Health/Function Post 26.13 %   Health/Function % Change 26.05 %   Socioeconomic Pre 20.29 %   Socioeconomic Post 27.88 %   Socioeconomic % Change  37.41 %   Psych/Spiritual Pre 21 %   Psych/Spiritual Post 30 %   Psych/Spiritual % Change 42.86 %   Family Pre 21 %   Family Post 30 %   Family % Change 42.86 %   GLOBAL Pre 20.74 %   GLOBAL Post 27.8 %   GLOBAL % Change 34.04 %      Personal Goals: Goals established at orientation with interventions provided to work toward goal.     Personal Goals and Risk Factors at Admission - 04/12/16 1201      Core Components/Risk Factors/Patient Goals on Admission    Weight Management Yes;Weight Loss   Intervention Weight Management: Provide education and appropriate resources to help participant work on and attain dietary goals.   Admit Weight 223 lb (101.2 kg)   Goal Weight: Short Term 198 lb (89.8 kg)   Goal Weight: Long Term 180 lb (81.6 kg)   Expected Outcomes Short Term: Continue to assess and modify interventions until short term weight is achieved;Long Term: Adherence to nutrition and physical activity/exercise program aimed toward attainment of established weight goal;Weight Loss: Understanding of general recommendations for a balanced deficit meal plan, which promotes 1-2 lb weight loss per week and includes a negative energy balance of [phone] kcal/d;Understanding recommendations for meals to include 15-35% energy as protein, 25-35% energy from fat, 35-60% energy from carbohydrates, less than 266m of dietary cholesterol, 20-35  gm of total fiber daily;Understanding of distribution of calorie intake throughout the day with the consumption of 4-5 meals/snacks   Sedentary Yes  Melvin Brewer has been walking 1.83mles several days/week. He also [place] a [employer] [place]   Intervention Provide advice, education, support and counseling about physical activity/exercise needs.;Develop  an individualized exercise prescription for aerobic and resistive training based on initial evaluation findings, risk stratification, comorbidities and participant's personal goals.   Expected Outcomes Achievement of increased cardiorespiratory fitness and enhanced flexibility, muscular endurance and strength shown through measurements of functional capacity and personal statement of participant.   Increase Strength and Stamina Yes   Intervention Provide advice, education, support and counseling about physical activity/exercise needs.;Develop an individualized exercise prescription for aerobic and resistive training based on initial evaluation findings, risk stratification, comorbidities and participant's personal goals.   Expected Outcomes Achievement of increased cardiorespiratory fitness and enhanced flexibility, muscular endurance and strength shown through measurements of functional capacity and personal statement of participant.   Improve shortness of breath with ADL's Yes   Intervention Provide education, individualized exercise plan and daily activity instruction to help decrease symptoms of SOB with activities of daily living.   Expected Outcomes Short Term: Achieves a reduction of symptoms when performing activities of daily living.   Develop more efficient breathing techniques such as purse lipped breathing and diaphragmatic breathing; and practicing self-pacing with activity Yes   Intervention Provide education, demonstration and support about specific breathing techniuqes utilized for more efficient breathing. Include techniques such as pursed lipped breathing, diaphragmatic breathing and self-pacing activity.   Expected Outcomes Short Term: Participant will be able to demonstrate and use breathing techniques as needed throughout daily activities.   Increase knowledge of respiratory medications and ability to use respiratory devices properly  Yes  Symbicort, ProAir, Spiriva, Spacer given  with instructions; Theophylline   Intervention Provide education and demonstration as needed of appropriate use of medications, inhalers, and oxygen therapy.   Expected Outcomes Short Term: Achieves understanding of medications use. Understands that oxygen is a medication prescribed by physician. Demonstrates appropriate use of inhaler and oxygen therapy.   Hypertension Yes   Intervention Provide education on lifestyle modifcations including regular physical activity/exercise, weight management, moderate sodium restriction and increased consumption of fresh fruit, vegetables, and low fat dairy, alcohol moderation, and smoking cessation.;Monitor prescription use compliance.   Expected Outcomes Short Term: Continued assessment and intervention until BP is < 140/952mHG in hypertensive participants. < 130/8024mG in hypertensive participants with diabetes, heart failure or chronic kidney disease.;Long Term: Maintenance of blood pressure at goal levels.   Lipids Yes   Intervention Provide education and support for participant on nutrition & aerobic/resistive exercise along with prescribed medications to achieve LDL '70mg'$ , HDL >'40mg'$ .   Expected Outcomes Short Term: Participant states understanding of desired cholesterol values and is compliant with medications prescribed. Participant is following exercise prescription and nutrition guidelines.;Long Term: Cholesterol controlled with medications as prescribed, with individualized exercise RX and with personalized nutrition plan. Value goals: LDL < '70mg'$ , HDL > 40 mg.       Personal Goals Discharge:     Goals and Risk Factor Review - 07/12/16 0637      Core Components/Risk Factors/Patient Goals Review   Personal Goals Review Weight Management/Obesity;Improve shortness of breath with ADL's;Increase knowledge of respiratory medications and ability to use respiratory devices properly.;Lipids;Hypertension;Develop more efficient breathing techniques such as  purse lipped breathing and diaphragmatic breathing and practicing self-pacing with activity.   Review Melvin DicLaduca  is close to graduation. He has improved his exercise goals throughout the program. His post 52md improved by 3626f- minimal importance difference for COPD is 98.76f83fHe has met with the dietitan and has changed his eatting habits and has now lost 10lbs with inches off his waist and hips. He has a good understanding of his medications and is compliant with them. I have reviewed his MDI's  and use of spacer. Plans for future exercise includes the millinium and his neighborhood walk of 1.23m36m (now in 38mi57m Melvin DickeSurgeonattended regularly and has enjoyed the education.    Expected Outcomes Continue exercise and self managing his COPD as [facility]  Nutrition & Weight - Outcomes:     Pre Biometrics - 04/12/16 1245      Pre Biometrics   Height 5' 10"  (1.778 m)   Weight 219 lb 6.4 oz (99.5 kg)   Waist Circumference 48.5 inches   Hip Circumference 45 inches   Waist to Hip Ratio 1.08 %   BMI (Calculated) 31.5         Post Biometrics - 07/06/16 1216       Post  Biometrics   Height 5' 10"  (1.778 m)   Weight 209 lb 3.2 oz (94.9 kg)   Waist Circumference 45 inches   Hip Circumference 40 inches   Waist to Hip Ratio 1.12 %   BMI (Calculated) 30.1      Nutrition:     Nutrition Therapy & Goals - 06/01/16 1705      Nutrition Therapy   Diet DASH   Drug/Food Interactions Statins/Certain Fruits   Protein (specify units) 12oz   Fiber 25 grams   Whole Grain Foods 3 servings   Saturated Fats 14 max. grams   Fruits and Vegetables 8 servings/day   Sodium 1500 grams     Personal Nutrition Goals   Nutrition Goal Gradually increase healthy carbs; aim for 9 servings daily, and work towards 12 servings daily.   Personal Goal #2 Aim for 1200 calories, gradually working up to 1700-1800calories over the next several months.    Comments Patient has been  following very low-carb diet for weight loss. He has lost 14lbs over the past several weeks. He is tracking his carb intake using a phone app, and is ranging from 12-60g. carbohydrate daily. Minimum needs = 130g daily. Advised him to increase carb and calorie intake slowly to allow for ongoing healthy weight loss.      Intervention Plan   Intervention Prescribe, educate and counsel regarding individualized specific dietary modifications aiming towards targeted core components such as weight, hypertension, lipid management, diabetes, heart failure and other comorbidities.;Nutrition handout(s) given to patient.   Expected Outcomes Short Term Goal: Understand basic principles of dietary content, such as calories, fat, sodium, cholesterol and nutrients.;Short Term Goal: A plan has been developed with personal nutrition goals set during dietitian appointment.;Long Term Goal: Adherence to prescribed nutrition plan.      Nutrition Discharge:   Education Questionnaire Score:     Knowledge Questionnaire Score - 07/11/16 1224      Knowledge Questionnaire Score   Pre Score 6/10   Post Score 9/10      Goals reviewed with patient; copy given to patient.

## 2016-07-18 NOTE — Progress Notes (Signed)
Discharge Summary  Patient Details  Name: Melvin Brewer MRN: [id number] Date of Birth: 04-22-1950 Referring Provider:     Number of Visits: 36  Reason for Discharge:  Patient reached a stable level of exercise. Patient independent in their exercise.  Smoking History:  History  Smoking Status   Former Smoker  Smokeless Tobacco   Not on file    Diagnosis:  No diagnosis found.  ADL [facility]:     Pulmonary Assessment Scores    Row Name 04/12/16 1159 05/25/16 1502 07/11/16 1223     ADL [facility]   ADL Phase Entry Mid Exit   SOB Score total 9 30 12    Rest 0 0 0   Walk 0 0 0   Stairs 2 3 1    Bath 1 1 0   Dress 1 1 0   Shop 0 1 0      Initial Exercise Prescription:     Initial Exercise Prescription - 04/12/16 1200      Date of Initial Exercise RX and Referring Provider   Date 04/12/16     Treadmill   MPH 2.3   Grade 0.5   Minutes 15   METs 2.9     Recumbant Bike   Level 3   RPM 60   Minutes 15   METs 2.9     Elliptical   Level 1   Speed 2.5   Minutes 15  3/3/3   METs 2.9     REL-XR   Level 4   Minutes 15   METs 2.9     T5 Nustep   Level 3   Minutes 15   METs 2.9     Prescription Details   Frequency (times per week) 3   Duration Progress to 45 minutes of aerobic exercise without signs/symptoms of physical distress     Intensity   THRR 40-80% of Max Heartrate 105-137   Ratings of Perceived Exertion 11-15   Perceived Dyspnea 2-4     Progression   Progression Continue to progress workloads to maintain intensity without signs/symptoms of physical distress.     Resistance Training   Training Prescription Yes   Weight 3   Reps 10-15      Discharge Exercise Prescription (Final Exercise Prescription Changes):     Exercise Prescription Changes - 07/18/16 0700      Response to Exercise   Blood Pressure (Admit) 124/64   Blood Pressure (Exercise) 126/74   Blood Pressure (Exit) 118/68   Heart Rate (Admit) 64 bpm   Heart Rate  (Exercise) 132 bpm   Heart Rate (Exit) 72 bpm   Oxygen Saturation (Admit) 93 %   Oxygen Saturation (Exercise) 91 %   Oxygen Saturation (Exit) 95 %   Rating of Perceived Exertion (Exercise) 14   Perceived Dyspnea (Exercise) 2   Duration Continue with 45 min of aerobic exercise without signs/symptoms of physical distress.   Intensity THRR unchanged     Progression   Progression Continue to progress workloads to maintain intensity without signs/symptoms of physical distress.     Resistance Training   Training Prescription Yes   Weight 4   Reps 10-15     Interval Training   Interval Training Yes     Treadmill   MPH 2.4   Grade 1.5   Minutes 15   METs 3.33     NuStep   Level 6   SPM 84   Minutes 15   METs 3.5     Elliptical  Level 1   Minutes 15      Functional Capacity:     6 Minute Walk    Row Name 04/12/16 1246 05/25/16 1137 07/06/16 1217     6 Minute Walk   Phase  -- Mid Program Discharge   Distance 1340 feet 1516 feet 1700 feet   Distance % Change  -- 13 % 26 %   Walk Time 6 minutes 6 minutes 6 minutes   # of Rest Breaks 0 0 0   MPH 2.54 2.87 3.21   METS 2.91 4.01 3.55   RPE 11 13 14    Perceived Dyspnea  2 2 2    VO2 Peak 9.46 14.03 12.44   Symptoms No No Yes (comment)   Comments  --  -- calves burning 6/10   Resting HR 73 bpm 68 bpm 64 bpm   Resting BP 130/68 112/64 124/64   Max Ex. HR 84 bpm 104 bpm 94 bpm   Max Ex. BP 138/68 144/76 126/74     Interval HR   Baseline HR 73 68 64   1 Minute HR 77 93 87   2 Minute HR 82 95 97   3 Minute HR 83  --  --   4 Minute HR 84 96  --   5 Minute HR 84 98  --   6 Minute HR 84 104 94   2 Minute Post HR  -- 86 80   Interval Heart Rate? Yes  -- Yes     Interval Oxygen   Interval Oxygen? Yes  -- Yes   Baseline Oxygen Saturation % 93 % 96 % 93 %   1 Minute Oxygen Saturation % 95 % 95 % 95 %   2 Minute Oxygen Saturation % 92 % 94 % 93 %   3 Minute Oxygen Saturation % 94 % 94 %  --   4 Minute Oxygen  Saturation % 96 % 95 %  --   5 Minute Oxygen Saturation % 95 % 94 % 93 %   6 Minute Oxygen Saturation % 97 % 93 % 93 %   2 Minute Post Oxygen Saturation %  -- 96 % 95 %      Psychological, QOL, Others - Outcomes: PHQ 2/9: Depression screen [facility] 2/9 07/11/2016 04/12/2016  Decreased Interest 0 0  Down, Depressed, Hopeless 0 0  PHQ - 2 Score 0 0  Altered sleeping 0 1  Tired, decreased energy 0 0  Change in appetite 0 1  Feeling bad or failure about yourself  0 0  Trouble concentrating 0 0  Moving slowly or fidgety/restless 0 0  Suicidal thoughts 0 0  PHQ-9 Score 0 2  Difficult doing work/chores Not difficult at all Not difficult at all    Quality of Life:     Quality of Life - 07/11/16 1224      Quality of Life Scores   Health/Function Pre 20.73 %   Health/Function Post 26.13 %   Health/Function % Change 26.05 %   Socioeconomic Pre 20.29 %   Socioeconomic Post 27.88 %   Socioeconomic % Change  37.41 %   Psych/Spiritual Pre 21 %   Psych/Spiritual Post 30 %   Psych/Spiritual % Change 42.86 %   Family Pre 21 %   Family Post 30 %   Family % Change 42.86 %   GLOBAL Pre 20.74 %   GLOBAL Post 27.8 %   GLOBAL % Change 34.04 %      Personal Goals: Goals  established at orientation with interventions provided to work toward goal.     Personal Goals and Risk Factors at Admission - 04/12/16 1201      Core Components/Risk Factors/Patient Goals on Admission    Weight Management Yes;Weight Loss   Intervention Weight Management: Provide education and appropriate resources to help participant work on and attain dietary goals.   Admit Weight 223 lb (101.2 kg)   Goal Weight: Short Term 198 lb (89.8 kg)   Goal Weight: Long Term 180 lb (81.6 kg)   Expected Outcomes Short Term: Continue to assess and modify interventions until short term weight is achieved;Long Term: Adherence to nutrition and physical activity/exercise program aimed toward attainment of established weight goal;Weight  Loss: Understanding of general recommendations for a balanced deficit meal plan, which promotes 1-2 lb weight loss per week and includes a negative energy balance of [phone] kcal/d;Understanding recommendations for meals to include 15-35% energy as protein, 25-35% energy from fat, 35-60% energy from carbohydrates, less than 279m of dietary cholesterol, 20-35 gm of total fiber daily;Understanding of distribution of calorie intake throughout the day with the consumption of 4-5 meals/snacks   Sedentary Yes  Melvin Brewer been walking 1.956mes several days/week. He also has a [employer] [place]  Intervention Provide advice, education, support and counseling about physical activity/exercise needs.;Develop an individualized exercise prescription for aerobic and resistive training based on initial evaluation findings, risk stratification, comorbidities and participant's personal goals.   Expected Outcomes Achievement of increased cardiorespiratory fitness and enhanced flexibility, muscular endurance and strength shown through measurements of functional capacity and personal statement of participant.   Increase Strength and Stamina Yes   Intervention Provide advice, education, support and counseling about physical activity/exercise needs.;Develop an individualized exercise prescription for aerobic and resistive training based on initial evaluation findings, risk stratification, comorbidities and participant's personal goals.   Expected Outcomes Achievement of increased cardiorespiratory fitness and enhanced flexibility, muscular endurance and strength shown through measurements of functional capacity and personal statement of participant.   Improve shortness of breath with ADL's Yes   Intervention Provide education, individualized exercise plan and daily activity instruction to help decrease symptoms of SOB with activities of daily living.   Expected Outcomes Short Term: Achieves a reduction of  symptoms when performing activities of daily living.   Develop more efficient breathing techniques such as purse lipped breathing and diaphragmatic breathing; and practicing self-pacing with activity Yes   Intervention Provide education, demonstration and support about specific breathing techniuqes utilized for more efficient breathing. Include techniques such as pursed lipped breathing, diaphragmatic breathing and self-pacing activity.   Expected Outcomes Short Term: Participant will be able to demonstrate and use breathing techniques as needed throughout daily activities.   Increase knowledge of respiratory medications and ability to use respiratory devices properly  Yes  Symbicort, ProAir, Spiriva, Spacer given with instructions; Theophylline   Intervention Provide education and demonstration as needed of appropriate use of medications, inhalers, and oxygen therapy.   Expected Outcomes Short Term: Achieves understanding of medications use. Understands that oxygen is a medication prescribed by physician. Demonstrates appropriate use of inhaler and oxygen therapy.   Hypertension Yes   Intervention Provide education on lifestyle modifcations including regular physical activity/exercise, weight management, moderate sodium restriction and increased consumption of fresh fruit, vegetables, and low fat dairy, alcohol moderation, and smoking cessation.;Monitor prescription use compliance.   Expected Outcomes Short Term: Continued assessment and intervention until BP is < 140/9065mG in hypertensive participants. < 130/50m48m in hypertensive  participants with diabetes, heart failure or chronic kidney disease.;Long Term: Maintenance of blood pressure at goal levels.   Lipids Yes   Intervention Provide education and support for participant on nutrition & aerobic/resistive exercise along with prescribed medications to achieve LDL <39m, HDL >441m   Expected Outcomes Short Term: Participant states understanding  of desired cholesterol values and is compliant with medications prescribed. Participant is following exercise prescription and nutrition guidelines.;Long Term: Cholesterol controlled with medications as prescribed, with individualized exercise RX and with personalized nutrition plan. Value goals: LDL < 7058mHDL > 40 mg.       Personal Goals Discharge:     Goals and Risk Factor Review    Row Name 04/18/16 1043 04/18/16 1121 05/06/16 1139 07/12/16 0636 07/12/16 [id number]  Core Components/Risk Factors/Patient Goals Review   Personal Goals Review Develop more efficient breathing techniques such as purse lipped breathing and diaphragmatic breathing and practicing self-pacing with activity. Weight Management/Obesity Weight Management/Obesity;Sedentary;Increase Strength and Stamina;Other Weight Management/Obesity;Improve shortness of breath with ADL's;Increase knowledge of respiratory medications and ability to use respiratory devices properly.;Develop more efficient breathing techniques such as purse lipped breathing and diaphragmatic breathing and practicing self-pacing with activity.;Lipids;Hypertension Weight Management/Obesity;Improve shortness of breath with ADL's;Increase knowledge of respiratory medications and ability to use respiratory devices properly.;Lipids;Hypertension;Develop more efficient breathing techniques such as purse lipped breathing and diaphragmatic breathing and practicing self-pacing with activity.   Review Pursed lip breathing was discussed and demonstrated with patient. He demonstarted understanding of this breathing technique and when to use it.  Patient stated that he was interested in meeting with the dietician. He was given the paperwork and contact information to schedule an appointment.  GerMaximuss had a cold and is finishing up a Zpac and prednisone.  Even with this, he has imporved his time on Elliptical from 1 - 3 1/2 minutes, and overall is down 11 1/2 lb.  -- Melvin Brewer. He has improved his exercise goals throughout the program. His post [id number] by [id number] importance difference for COPD is 98.4ft. Melvin met with the dietitan and has changed his eatting habits and has now lost 10lbs with inches off his waist and hips. He has a good understanding of his medications and is compliant with them. I have reviewed his MDI's  and use of spacer. Plans for future exercise includes the millinium and his neighborhood walk of 1.84mile59mow in 38mins)27m Melvin Brewer regularly and has enjoyed the education.    Expected Outcomes Patient will use PLB during exercise and ADL's to help control SOB and maintian an acceptable oxygen saturation.  Patient will call and make an appointment to meet with the dietician.  Melvin Brewer regular exercise and see improvement in his stamina, and will continue to be successful at weight loss.  -- Continue exercise and self managing his COPD as learned [facility] Outcomes:     Pre Biometrics - 04/12/16 1245      Pre Biometrics   Height 5' 10"  (1.778 m)   Weight 219 lb 6.4 oz (99.5 kg)   Waist Circumference 48.5 inches   Hip Circumference 45 inches   Waist to Hip Ratio 1.08 %   BMI (Calculated) 31.5         Post Biometrics - 07/06/16 1216       Post  Biometrics   Height 5' 10"  (1.778 m)   Weight  209 lb 3.2 oz (94.9 kg)   Waist Circumference 45 inches   Hip Circumference 40 inches   Waist to Hip Ratio 1.12 %   BMI (Calculated) 30.1      Nutrition:     Nutrition Therapy & Goals - 06/01/16 1705      Nutrition Therapy   Diet DASH   Drug/Food Interactions Statins/Certain Fruits   Protein (specify units) 12oz   Fiber 25 grams   Whole Grain Foods 3 servings   Saturated Fats 14 max. grams   Fruits and Vegetables 8 servings/day   Sodium 1500 grams     Personal Nutrition Goals   Nutrition Goal Gradually increase healthy carbs; aim for 9  servings daily, and work towards 12 servings daily.   Personal Goal #2 Aim for 1200 calories, gradually working up to 1700-1800calories over the next several months.    Comments Patient has been following very low-carb diet for weight loss. He has lost 14lbs over the past several weeks. He is tracking his carb intake using a phone app, and is ranging from 12-60g. carbohydrate daily. Minimum needs = 130g daily. Advised him to increase carb and calorie intake slowly to allow for ongoing healthy weight loss.      Intervention Plan   Intervention Prescribe, educate and counsel regarding individualized specific dietary modifications aiming towards targeted core components such as weight, hypertension, lipid management, diabetes, heart failure and other comorbidities.;Nutrition handout(s) given to patient.   Expected Outcomes Short Term Goal: Understand basic principles of dietary content, such as calories, fat, sodium, cholesterol and nutrients.;Short Term Goal: A plan has been developed with personal nutrition goals set during dietitian appointment.;Long Term Goal: Adherence to prescribed nutrition plan.      Nutrition Discharge:   Education Questionnaire Score:     Knowledge Questionnaire Score - 07/11/16 1224      Knowledge Questionnaire Score   Pre Score 6/10   Post Score 9/10      Goals reviewed with patient; copy given to patient.

## 2016-07-18 NOTE — Progress Notes (Signed)
Daily Session Note  Patient Details  Name: Melvin Brewer MRN: [id number] Date of Birth: 1950-06-14 Referring Provider:    Encounter Date: 07/18/2016  Check In:     Session Check In - 07/18/16 1247      Check-In   Location [facility]   Staff Present Nada Maclachlan, BA, ACSM CEP, Exercise Physiologist;Laureen Owens Shark, BS, RRT, Respiratory Bertis Ruddy, BS, ACSM CEP, Exercise Physiologist   Supervising physician immediately available to respond to emergencies [facility] immediately available ER MD   Physician(s) Corky Downs and Jimmye Norman    Medication changes reported     No   Fall or balance concerns reported    No   Warm-up and Cool-down Performed as group-led instruction   Resistance Training Performed Yes   VAD Patient? No     Pain Assessment   Currently in Pain? No/denies   Multiple Pain Sites No           Exercise Prescription Changes - 07/18/16 0700      Response to Exercise   Blood Pressure (Admit) 124/64   Blood Pressure (Exercise) 126/74   Blood Pressure (Exit) 118/68   Heart Rate (Admit) 64 bpm   Heart Rate (Exercise) 132 bpm   Heart Rate (Exit) 72 bpm   Oxygen Saturation (Admit) 93 %   Oxygen Saturation (Exercise) 91 %   Oxygen Saturation (Exit) 95 %   Rating of Perceived Exertion (Exercise) 14   Perceived Dyspnea (Exercise) 2   Duration Continue with 45 min of aerobic exercise without signs/symptoms of physical distress.   Intensity THRR unchanged     Progression   Progression Continue to progress workloads to maintain intensity without signs/symptoms of physical distress.     Resistance Training   Training Prescription Yes   Weight 4   Reps 10-15     Interval Training   Interval Training Yes     Treadmill   MPH 2.4   Grade 1.5   Minutes 15   METs 3.33     NuStep   Level 6   SPM 84   Minutes 15   METs 3.5     Elliptical   Level 1   Minutes 15      History  Smoking Status  . Former Smoker  Smokeless  Tobacco  . Not on file    Goals Met:  Proper associated with RPD/PD & O2 Sat Independence with exercise equipment Exercise tolerated well Personal goals reviewed Strength training completed today  Goals Unmet:  Not Applicable  Comments:  Melvin Brewer graduated today from cardiac rehab with 36 sessions completed.  Details of the patient's exercise prescription and what He needs to do in order to continue the prescription and progress were discussed with patient.  Patient was given a copy of prescription and goals.  Patient verbalized understanding.  Melvin Brewer plans to continue to exercise by by going to a community fitness center and walking at home.    Dr. Emily Filbert is Medical Director for [facility] and [facility].

## 2017-05-02 ENCOUNTER — Other Ambulatory Visit: Payer: Self-pay

## 2017-05-02 ENCOUNTER — Encounter: Payer: Medicare Other | Attending: Family Medicine

## 2017-05-02 VITALS — Ht 70.0 in | Wt 219.6 lb

## 2017-05-02 DIAGNOSIS — J441 Chronic obstructive pulmonary disease with (acute) exacerbation: Secondary | ICD-10-CM | POA: Diagnosis not present

## 2017-05-02 DIAGNOSIS — J449 Chronic obstructive pulmonary disease, unspecified: Secondary | ICD-10-CM

## 2017-05-02 NOTE — Patient Instructions (Signed)
Patient Instructions  Patient Details  Name: Melvin Brewer MRN: [id number] Date of Birth: 1950/07/28 Referring Provider:  Hortencia Pilar, MD  Below are your personal goals for exercise, nutrition, and risk factors. Our goal is to help you stay on track towards obtaining and maintaining these goals. We will be discussing your progress on these goals with you throughout the program.  Initial Exercise Prescription: Initial Exercise Prescription - 05/02/17 1200      Date of Initial Exercise RX and Referring Provider   Date  05/02/17    Referring Provider  [facility]      Treadmill   MPH  2.5    Grade  1    Minutes  15      Recumbant Bike   Level  4    RPM  60    Watts  40    Minutes  15    METs  3.3      Elliptical   Level  1    Speed  2.5    Minutes  15      REL-XR   Level  3    Speed  50    Minutes  15    METs  3.3      Prescription Details   Frequency (times per week)  3    Duration  Progress to 45 minutes of aerobic exercise without signs/symptoms of physical distress      Intensity   THRR 40-80% of Max Heartrate  104-136    Ratings of Perceived Exertion  11-13    Perceived Dyspnea  0-4      Resistance Training   Training Prescription  Yes    Weight  4 lb    Reps  10-15       Exercise Goals: Frequency: Be able to perform aerobic exercise two to three times per week in program working toward 2-5 days per week of home exercise.  Intensity: Work with a perceived exertion of 11 (fairly light) - 15 (hard) while following your exercise prescription.  We will make changes to your prescription with you as you progress through the program.   Duration: Be able to do 30 to 45 minutes of continuous aerobic exercise in addition to a 5 minute warm-up and a 5 minute cool-down routine.   Nutrition Goals: Your personal nutrition goals will be established when you do your nutrition analysis with the dietician.  The following are general nutrition guidelines to  follow: Cholesterol < 200mg /day Sodium < 1500mg /day Fiber: Men over 50 yrs - 30 grams per day  Personal Goals: Personal Goals and Risk Factors at Admission - 05/02/17 1111      Core Components/Risk Factors/Patient Goals on Admission    Weight Management  Yes;Weight Loss    Intervention  Weight Management: Develop a combined nutrition and exercise program designed to reach desired caloric intake, while maintaining appropriate intake of nutrient and fiber, sodium and fats, and appropriate energy expenditure required for the weight goal.;Weight Management: Provide education and appropriate resources to help participant work on and attain dietary goals.;Obesity: Provide education and appropriate resources to help participant work on and attain dietary goals.    Admit Weight  219 lb 4.8 oz (99.5 kg)    Goal Weight: Short Term  214 lb (97.1 kg)    Goal Weight: Long Term  180 lb (81.6 kg)    Expected Outcomes  Short Term: Continue to assess and modify interventions until short term weight is achieved;Long Term: Adherence to nutrition  and physical activity/exercise program aimed toward attainment of established weight goal;Weight Loss: Understanding of general recommendations for a balanced deficit meal plan, which promotes 1-2 lb weight loss per week and includes a negative energy balance of [phone] kcal/d;Understanding recommendations for meals to include 15-35% energy as protein, 25-35% energy from fat, 35-60% energy from carbohydrates, less than 200mg  of dietary cholesterol, 20-35 gm of total fiber daily;Understanding of distribution of calorie intake throughout the day with the consumption of 4-5 meals/snacks    Improve shortness of breath with ADL's  Yes    Intervention  Provide education, individualized exercise plan and daily activity instruction to help decrease symptoms of SOB with activities of daily living.    Expected Outcomes  Short Term: Improve cardiorespiratory fitness to achieve a  reduction of symptoms when performing ADLs;Long Term: Be able to perform more ADLs without symptoms or delay the onset of symptoms    Diabetes  Yes pre diabetic    Intervention  Provide education about signs/symptoms and action to take for hypo/hyperglycemia.;Provide education about proper nutrition, including hydration, and aerobic/resistive exercise prescription along with prescribed medications to achieve blood glucose in normal ranges: Fasting glucose 65-99 mg/dL    Expected Outcomes  Short Term: Participant verbalizes understanding of the signs/symptoms and immediate care of hyper/hypoglycemia, proper foot care and importance of medication, aerobic/resistive exercise and nutrition plan for blood glucose control.;Long Term: Attainment of HbA1C < 7%.    Hypertension  Yes    Intervention  Provide education on lifestyle modifcations including regular physical activity/exercise, weight management, moderate sodium restriction and increased consumption of fresh fruit, vegetables, and low fat dairy, alcohol moderation, and smoking cessation.;Monitor prescription use compliance.    Expected Outcomes  Short Term: Continued assessment and intervention until BP is < 140/79mm HG in hypertensive participants. < 130/28mm HG in hypertensive participants with diabetes, heart failure or chronic kidney disease.;Long Term: Maintenance of blood pressure at goal levels.    Lipids  Yes    Intervention  Provide education and support for participant on nutrition & aerobic/resistive exercise along with prescribed medications to achieve LDL 70mg , HDL >40mg .    Expected Outcomes  Short Term: Participant states understanding of desired cholesterol values and is compliant with medications prescribed. Participant is following exercise prescription and nutrition guidelines.;Long Term: Cholesterol controlled with medications as prescribed, with individualized exercise RX and with personalized nutrition plan. Value goals: LDL < 70mg ,  HDL > 40 mg.       Tobacco Use Initial Evaluation: Social History   Tobacco Use  Smoking Status Former Smoker  . Packs/day: 2.00  . Years: 43.00  . Pack years: 86.00  . Types: Cigarettes  . Last attempt to quit: 04/11/2004  . Years since quitting: 13.0  Smokeless Tobacco Never Used    Exercise Goals and Review: Exercise Goals    Row Name 05/02/17 1216             Exercise Goals   Increase Physical Activity  Yes       Intervention  Provide advice, education, support and counseling about physical activity/exercise needs.;Develop an individualized exercise prescription for aerobic and resistive training based on initial evaluation findings, risk stratification, comorbidities and participant's personal goals.       Expected Outcomes  Short Term: Attend rehab on a regular basis to increase amount of physical activity.;Long Term: Add in home exercise to make exercise part of routine and to increase amount of physical activity.;Long Term: Exercising regularly at least 3-5 days a week.  Increase Strength and Stamina  Yes       Intervention  Provide advice, education, support and counseling about physical activity/exercise needs.;Develop an individualized exercise prescription for aerobic and resistive training based on initial evaluation findings, risk stratification, comorbidities and participant's personal goals.       Expected Outcomes  Short Term: Increase workloads from initial exercise prescription for resistance, speed, and METs.;Short Term: Perform resistance training exercises routinely during rehab and add in resistance training at home;Long Term: Improve cardiorespiratory fitness, muscular endurance and strength as measured by increased METs and functional capacity (6MWT)       Able to understand and use rate of perceived exertion (RPE) scale  Yes       Intervention  Provide education and explanation on how to use RPE scale       Expected Outcomes  Short Term: Able to use RPE  daily in rehab to express subjective intensity level;Long Term:  Able to use RPE to guide intensity level when exercising independently       Able to understand and use Dyspnea scale  Yes       Intervention  Provide education and explanation on how to use Dyspnea scale       Expected Outcomes  Short Term: Able to use Dyspnea scale daily in rehab to express subjective sense of shortness of breath during exertion;Long Term: Able to use Dyspnea scale to guide intensity level when exercising independently       Knowledge and understanding of Target Heart Rate Range (THRR)  Yes       Intervention  Provide education and explanation of THRR including how the numbers were predicted and where they are located for reference       Expected Outcomes  Short Term: Able to state/look up THRR;Long Term: Able to use THRR to govern intensity when exercising independently;Short Term: Able to use daily as guideline for intensity in rehab       Able to check pulse independently  Yes       Intervention  Provide education and demonstration on how to check pulse in carotid and radial arteries.;Review the importance of being able to check your own pulse for safety during independent exercise       Expected Outcomes  Short Term: Able to explain why pulse checking is important during independent exercise;Long Term: Able to check pulse independently and accurately       Understanding of Exercise Prescription  Yes       Intervention  Provide education, explanation, and written materials on patient's individual exercise prescription       Expected Outcomes  Short Term: Able to explain program exercise prescription;Long Term: Able to explain home exercise prescription to exercise independently          Copy of goals given to participant.

## 2017-05-02 NOTE — Progress Notes (Signed)
Pulmonary Individual Treatment Plan  Patient Details  Name: HERIBERTO STMARTIN MRN: [id number] Date of Birth: 13-Mar-1951 Referring Provider:     Pulmonary Rehab from 05/02/2017 in [facility] Cardiac and Pulmonary Rehab  Referring Provider  Grandis      Initial Encounter Date:    Pulmonary Rehab from 05/02/2017 in [facility] Cardiac and Pulmonary Rehab  Date  05/02/17  Referring Provider  Grandis      Visit Diagnosis: Chronic obstructive pulmonary disease, unspecified COPD type ([place])  Patient's Home Medications on Admission:  Current Outpatient Medications:  .  albuterol (PROVENTIL HFA;VENTOLIN HFA) 108 (90 BASE) MCG/ACT inhaler, Inhale 2 puffs into the lungs every 6 (six) hours as needed for wheezing or shortness of breath., Disp: , Rfl:  .  amitriptyline (ELAVIL) 50 MG tablet, Take 50 mg by mouth at bedtime., Disp: , Rfl:  .  aspirin 325 MG EC tablet, Take 325 mg by mouth daily., Disp: , Rfl:  .  atorvastatin (LIPITOR) 80 MG tablet, Take 80 mg by mouth daily., Disp: , Rfl:  .  diazepam (VALIUM) 5 MG tablet, Take 1 tablet (5 mg total) by mouth every 6 (six) hours as needed for muscle spasms., Disp: 50 tablet, Rfl: 0 .  docusate sodium 100 MG CAPS, Take 100 mg by mouth 2 (two) times daily., Disp: 60 capsule, Rfl: 0 .  fluticasone (FLONASE) 50 MCG/ACT nasal spray, Place 1 spray into both nostrils daily., Disp: , Rfl:  .  gabapentin (NEURONTIN) 300 MG capsule, Take 300 mg by mouth 3 (three) times daily., Disp: , Rfl:  .  metoprolol (LOPRESSOR) 50 MG tablet, Take 50 mg by mouth 2 (two) times daily., Disp: , Rfl:  .  Olodaterol HCl (STRIVERDI RESPIMAT) 2.5 MCG/ACT AERS, Inhale 1 puff into the lungs 2 (two) times daily., Disp: , Rfl:  .  oxyCODONE-acetaminophen (PERCOCET/ROXICET) 5-325 MG per tablet, Take 1-2 tablets by mouth every 4 (four) hours as needed for moderate pain., Disp: 100 tablet, Rfl: 0 .  theophylline (THEODUR) 200 MG 12 hr tablet, Take 200 mg by mouth 2 (two) times daily., Disp: , Rfl:   .  tiotropium (SPIRIVA) 18 MCG inhalation capsule, Place 18 mcg into inhaler and inhale daily., Disp: , Rfl:   Past Medical History: Past Medical History:  Diagnosis Date  . COPD (chronic obstructive pulmonary disease) ([place])   . Coronary artery disease   . H/O hiatal hernia    AGE 67 S  NO MEDS  . Shortness of breath    W/ EXERTION     Tobacco Use: Social History   Tobacco Use  Smoking Status Former Smoker  . Packs/day: 2.00  . Years: 43.00  . Pack years: 86.00  . Types: Cigarettes  . Last attempt to quit: 04/11/2004  . Years since quitting: 13.0  Smokeless Tobacco Never Used    Labs: Recent Review Flowsheet Data    There is no flowsheet data to display.       Pulmonary Assessment Scores: Pulmonary Assessment Scores    Row Name 05/02/17 1106         ADL UCSD   ADL Phase  Entry     SOB Score total  44     Rest  0     Walk  0     Stairs  3     Bath  2     Dress  2     Shop  2       CAT Score   CAT Score  20       mMRC Score   mMRC Score  1        Pulmonary Function Assessment: Pulmonary Function Assessment - 05/02/17 1112      Initial Spirometry Results   FVC%  31 %    FEV1%  34 %    FEV1/FVC Ratio  74.02    Comments  Good patient effort      Post Bronchodilator Spirometry Results   FVC%  36.58 %    FEV1%  36.72 %    FEV1/FVC Ratio  73.38    Comments  Good patient effort      Breath   Bilateral Breath Sounds  Clear;Decreased    Shortness of Breath  Yes;Limiting activity       Exercise Target Goals: Date: 05/02/17  Exercise Program Goal: Individual exercise prescription set using results from initial 6 min walk test and THRR while considering  patient's activity barriers and safety.    Exercise Prescription Goal: Initial exercise prescription builds to 30-45 minutes a day of aerobic activity, 2-3 days per week.  Home exercise guidelines will be given to patient during program as part of exercise prescription that the participant will  acknowledge.  Activity Barriers & Risk Stratification:   6 Minute Walk: 6 Minute Walk    Row Name 05/02/17 1217         6 Minute Walk   Distance  1520 feet     Walk Time  6 minutes     # of Rest Breaks  0     MPH  2.87     METS  3.3     RPE  15     Perceived Dyspnea   2     VO2 Peak  11.6     Symptoms  No     Resting HR  72 bpm     Resting BP  128/64     Resting Oxygen Saturation   97 %     Exercise Oxygen Saturation  during 6 min walk  90 %     Max Ex. HR  110 bpm     Max Ex. BP  134/78     2 Minute Post BP  128/72       Interval HR   1 Minute HR  101     2 Minute HR  106     3 Minute HR  103     5 Minute HR  110     6 Minute HR  100     Interval Heart Rate?  Yes       Interval Oxygen   Interval Oxygen?  Yes     Baseline Oxygen Saturation %  97 %     1 Minute Oxygen Saturation %  91 %     1 Minute Liters of Oxygen  0 L     2 Minute Oxygen Saturation %  90 %     2 Minute Liters of Oxygen  0 L     3 Minute Oxygen Saturation %  94 %     3 Minute Liters of Oxygen  0 L     4 Minute Liters of Oxygen  0 L     5 Minute Oxygen Saturation %  93 %     5 Minute Liters of Oxygen  0 L     6 Minute Oxygen Saturation %  94 %     6 Minute Liters of Oxygen  0 L  2 Minute Post Oxygen Saturation %  97 %     2 Minute Post Liters of Oxygen  0 L       Oxygen Initial Assessment: Oxygen Initial Assessment - 05/02/17 1110      Home Oxygen   Home Oxygen Device  None    Sleep Oxygen Prescription  None    Home Exercise Oxygen Prescription  None    Home at Rest Exercise Oxygen Prescription  None      Initial 6 min Walk   Oxygen Used  None      Program Oxygen Prescription   Program Oxygen Prescription  None      Intervention   Short Term Goals  To learn and understand importance of maintaining oxygen saturations>88%;To learn and demonstrate proper use of respiratory medications;To learn and demonstrate proper pursed lip breathing techniques or other breathing  techniques.;To learn and understand importance of monitoring SPO2 with pulse oximeter and demonstrate accurate use of the pulse oximeter.    Long  Term Goals  Verbalizes importance of monitoring SPO2 with pulse oximeter and return demonstration;Maintenance of O2 saturations>88%;Compliance with respiratory medication;Demonstrates proper use of MDI's;Exhibits proper breathing techniques, such as pursed lip breathing or other method taught during program session       Oxygen Re-Evaluation:   Oxygen Discharge (Final Oxygen Re-Evaluation):   Initial Exercise Prescription: Initial Exercise Prescription - 05/02/17 1200      Date of Initial Exercise RX and Referring Provider   Date  05/02/17    Referring Provider  Grandis      Treadmill   MPH  2.5    Grade  1    Minutes  15      Recumbant Bike   Level  4    RPM  60    Watts  40    Minutes  15    METs  3.3      Elliptical   Level  1    Speed  2.5    Minutes  15      REL-XR   Level  3    Speed  50    Minutes  15    METs  3.3      Prescription Details   Frequency (times per week)  3    Duration  Progress to 45 minutes of aerobic exercise without signs/symptoms of physical distress      Intensity   THRR 40-80% of Max Heartrate  104-136    Ratings of Perceived Exertion  11-13    Perceived Dyspnea  0-4      Resistance Training   Training Prescription  Yes    Weight  4 lb    Reps  10-15       Perform Capillary Blood Glucose checks as needed.  Exercise Prescription Changes:   Exercise Comments:   Exercise Goals and Review: Exercise Goals    Row Name 05/02/17 1216             Exercise Goals   Increase Physical Activity  Yes       Intervention  Provide advice, education, support and counseling about physical activity/exercise needs.;Develop an individualized exercise prescription for aerobic and resistive training based on initial evaluation findings, risk stratification, comorbidities and participant's  personal goals.       Expected Outcomes  Short Term: Attend rehab on a regular basis to increase amount of physical activity.;Long Term: Add in home exercise to make exercise part of routine and to increase amount of  physical activity.;Long Term: Exercising regularly at least 3-5 days a week.       Increase Strength and Stamina  Yes       Intervention  Provide advice, education, support and counseling about physical activity/exercise needs.;Develop an individualized exercise prescription for aerobic and resistive training based on initial evaluation findings, risk stratification, comorbidities and participant's personal goals.       Expected Outcomes  Short Term: Increase workloads from initial exercise prescription for resistance, speed, and METs.;Short Term: Perform resistance training exercises routinely during rehab and add in resistance training at home;Long Term: Improve cardiorespiratory fitness, muscular endurance and strength as measured by increased METs and functional capacity (6MWT)       Able to understand and use rate of perceived exertion (RPE) scale  Yes       Intervention  Provide education and explanation on how to use RPE scale       Expected Outcomes  Short Term: Able to use RPE daily in rehab to express subjective intensity level;Long Term:  Able to use RPE to guide intensity level when exercising independently       Able to understand and use Dyspnea scale  Yes       Intervention  Provide education and explanation on how to use Dyspnea scale       Expected Outcomes  Short Term: Able to use Dyspnea scale daily in rehab to express subjective sense of shortness of breath during exertion;Long Term: Able to use Dyspnea scale to guide intensity level when exercising independently       Knowledge and understanding of Target Heart Rate Range (THRR)  Yes       Intervention  Provide education and explanation of THRR including how the numbers were predicted and where they are located for  reference       Expected Outcomes  Short Term: Able to state/look up THRR;Long Term: Able to use THRR to govern intensity when exercising independently;Short Term: Able to use daily as guideline for intensity in rehab       Able to check pulse independently  Yes       Intervention  Provide education and demonstration on how to check pulse in carotid and radial arteries.;Review the importance of being able to check your own pulse for safety during independent exercise       Expected Outcomes  Short Term: Able to explain why pulse checking is important during independent exercise;Long Term: Able to check pulse independently and accurately       Understanding of Exercise Prescription  Yes       Intervention  Provide education, explanation, and written materials on patient's individual exercise prescription       Expected Outcomes  Short Term: Able to explain program exercise prescription;Long Term: Able to explain home exercise prescription to exercise independently          Exercise Goals Re-Evaluation :   Discharge Exercise Prescription (Final Exercise Prescription Changes):   Nutrition:  Target Goals: Understanding of nutrition guidelines, daily intake of sodium 1500mg , cholesterol 200mg , calories 30% from fat and 7% or less from saturated fats, daily to have 5 or more servings of fruits and vegetables.  Biometrics: Pre Biometrics - 05/02/17 1216      Pre Biometrics   Height  5\' 10"  (1.778 m)    Weight  219 lb 9.6 oz (99.6 kg)    Waist Circumference  43 inches    Hip Circumference  43 inches    Waist to  Hip Ratio  1 %    BMI (Calculated)  31.51        Nutrition Therapy Plan and Nutrition Goals: Nutrition Therapy & Goals - 05/02/17 1105      Personal Nutrition Goals   Comments  He would like to lose weight and eat healthier. He would also like to meet with the dietician.      Intervention Plan   Intervention  Prescribe, educate and counsel regarding individualized specific  dietary modifications aiming towards targeted core components such as weight, hypertension, lipid management, diabetes, heart failure and other comorbidities.;Nutrition handout(s) given to patient.    Expected Outcomes  Long Term Goal: Adherence to prescribed nutrition plan.;Short Term Goal: Understand basic principles of dietary content, such as calories, fat, sodium, cholesterol and nutrients.       Nutrition Assessments: Nutrition Assessments - 05/02/17 1126      MEDFICTS Scores   Pre Score  22       Nutrition Goals Re-Evaluation:   Nutrition Goals Discharge (Final Nutrition Goals Re-Evaluation):   Psychosocial: Target Goals: Acknowledge presence or absence of significant depression and/or stress, maximize coping skills, provide positive support system. Participant is able to verbalize types and ability to use techniques and skills needed for reducing stress and depression.   Initial Review & Psychosocial Screening: Initial Psych Review & Screening - 05/02/17 1104      Initial Review   Current issues with  None Identified      Family Dynamics   Good Support System?  Yes    Comments  His family is a great support system      Barriers   Psychosocial barriers to participate in program  The patient should benefit from training in stress management and relaxation.      Screening Interventions   Interventions  Encouraged to exercise       Quality of Life Scores:  Scores of 19 and below usually indicate a poorer quality of life in these areas.  A difference of  2-3 points is a clinically meaningful difference.  A difference of 2-3 points in the total score of the Quality of Life Index has been associated with significant improvement in overall quality of life, self-image, physical symptoms, and general health in studies assessing change in quality of life.  PHQ-9: Recent Review Flowsheet Data    Depression screen [facility] 2/9 05/02/2017 07/11/2016 04/12/2016   Decreased Interest 0 0  0   Down, Depressed, Hopeless 0 0 0   PHQ - 2 Score 0 0 0   Altered sleeping 1 0 1   Tired, decreased energy 1 0 0   Change in appetite 0 0 1   Feeling bad or failure about yourself  0 0 0   Trouble concentrating 0 0 0   Moving slowly or fidgety/restless 0 0 0   Suicidal thoughts 0 0 0   PHQ-9 Score 2 0 2   Difficult doing work/chores Not difficult at all Not difficult at all Not difficult at all     Interpretation of Total Score  Total Score Depression Severity:  1-4 = Minimal depression, 5-9 = Mild depression, 10-14 = Moderate depression, 15-19 = Moderately severe depression, 20-27 = Severe depression   Psychosocial Evaluation and Intervention:   Psychosocial Re-Evaluation:   Psychosocial Discharge (Final Psychosocial Re-Evaluation):   Education: Education Goals: Education classes will be provided on a weekly basis, covering required topics. Participant will state understanding/return demonstration of topics presented.  Learning Barriers/Preferences: Learning Barriers/Preferences - 05/02/17  [place] Barriers/Preferences   Learning Barriers  None    Learning Preferences  None       Education Topics: Initial Evaluation Education: - Verbal, written and demonstration of respiratory meds, RPE/PD scales, oximetry and breathing techniques. Instruction on use of nebulizers and MDIs: cleaning and proper use, rinsing mouth with steroid doses and importance of monitoring MDI activations.   Pulmonary Rehab from 05/02/2017 in [facility] Cardiac and Pulmonary Rehab  Date  05/02/17  Educator  [facility]  Instruction Review Code  1- Verbalizes Understanding      General Nutrition Guidelines/Fats and Fiber: -Group instruction provided by verbal, written material, models and posters to present the general guidelines for heart healthy nutrition. Gives an explanation and review of dietary fats and fiber.   Pulmonary Rehab from 07/15/2016 in [facility] Cardiac and Pulmonary Rehab  Date  04/25/16   Educator  CR  Instruction Review Code (retired)  2- meets goals/outcomes      Controlling Sodium/Reading Food Labels: -Group verbal and written material supporting the discussion of sodium use in heart healthy nutrition. Review and explanation with models, verbal and written materials for utilization of the food label.   Pulmonary Rehab from 07/15/2016 in [facility] Cardiac and Pulmonary Rehab  Date  05/02/16  Educator  CR  Instruction Review Code (retired)  2- meets goals/outcomes      Exercise Physiology & Risk Factors: - Group verbal and written instruction with models to review the exercise physiology of the cardiovascular system and associated critical values. Details cardiovascular disease risk factors and the goals associated with each risk factor.   Pulmonary Rehab from 07/15/2016 in [facility] Cardiac and Pulmonary Rehab  Date  07/15/16  Educator  Nada Maclachlan, EP  Instruction Review Code (retired)  2- meets goals/outcomes      Aerobic Exercise & Resistance Training: - Gives group verbal and written discussion on the health impact of inactivity. On the components of aerobic and resistive training programs and the benefits of this training and how to safely progress through these programs.   Pulmonary Rehab from 07/15/2016 in [facility] Cardiac and Pulmonary Rehab  Date  05/11/16  Educator  [facility]  Instruction Review Code (retired)  2- Statistician, Balance, General Exercise Guidelines: - Provides group verbal and written instruction on the benefits of flexibility and balance training programs. Provides general exercise guidelines with specific guidelines to those with heart or lung disease. Demonstration and skill practice provided.   Pulmonary Rehab from 07/15/2016 in [facility] Cardiac and Pulmonary Rehab  Date  06/03/16  Educator  Nada Maclachlan  Instruction Review Code (retired)  2- meets goals/outcomes      Stress Management: - Provides group verbal and written  instruction about the health risks of elevated stress, cause of high stress, and healthy ways to reduce stress.   Pulmonary Rehab from 07/15/2016 in [facility] Cardiac and Pulmonary Rehab  Date  07/13/16  Educator  [facility]  Instruction Review Code (retired)  2- meets goals/outcomes      Depression: - Provides group verbal and written instruction on the correlation between heart/lung disease and depressed mood, treatment options, and the stigmas associated with seeking treatment.   Pulmonary Rehab from 07/15/2016 in [facility] Cardiac and Pulmonary Rehab  Date  06/15/16  Educator  [facility]  Instruction Review Code (retired)  2- meets goals/outcomes      Exercise & Equipment Safety: - Individual verbal instruction and demonstration of equipment use and safety with  use of the equipment.   Pulmonary Rehab from 05/02/2017 in [facility] Cardiac and Pulmonary Rehab  Date  05/02/17  Educator  [facility]  Instruction Review Code  1- Verbalizes Understanding      Infection Prevention: - Provides verbal and written material to individual with discussion of infection control including proper hand washing and proper equipment cleaning during exercise session.   Pulmonary Rehab from 05/02/2017 in [facility] Cardiac and Pulmonary Rehab  Date  05/02/17  Educator  [facility]  Instruction Review Code  1- Verbalizes Understanding      Falls Prevention: - Provides verbal and written material to individual with discussion of falls prevention and safety.   Pulmonary Rehab from 05/02/2017 in [facility] Cardiac and Pulmonary Rehab  Date  05/02/17  Educator  [facility]  Instruction Review Code  1- Verbalizes Understanding      Diabetes: - Individual verbal and written instruction to review signs/symptoms of diabetes, desired ranges of glucose level fasting, after meals and with exercise. Advice that pre and post exercise glucose checks will be done for 3 sessions at entry of program.   Chronic Lung Diseases: - Group verbal and written instruction to review new  updates, new respiratory medications, new advancements in procedures and treatments. Provide informative websites and "800" numbers of self-education.   Pulmonary Rehab from 07/15/2016 in [facility] Cardiac and Pulmonary Rehab  Date  05/04/16  Educator  LB  Instruction Review Code (retired)  2- meets goals/outcomes      Lung Procedures: - Group verbal and written instruction to describe testing methods done to diagnose lung disease. Review the outcome of test results. Describe the treatment choices: Pulmonary Function Tests, ABGs and oximetry.   Energy Conservation: - Provide group verbal and written instruction for methods to conserve energy, plan and organize activities. Instruct on pacing techniques, use of adaptive equipment and posture/positioning to relieve shortness of breath.   Pulmonary Rehab from 07/15/2016 in [facility] Cardiac and Pulmonary Rehab  Date  06/08/16  Educator  [facility]  Instruction Review Code (retired)  2- meets goals/outcomes      Triggers: - Group verbal and written instruction to review types of environmental controls: home humidity, furnaces, filters, dust mite/pet prevention, HEPA vacuums. To discuss weather changes, air quality and the benefits of nasal washing.   Pulmonary Rehab from 07/15/2016 in [facility] Cardiac and Pulmonary Rehab  Date  06/01/16  Educator  LB  Instruction Review Code (retired)  2- meets goals/outcomes      Exacerbations: - Group verbal and written instruction to provide: warning signs, infection symptoms, calling MD promptly, preventive modes, and value of vaccinations. Review: effective airway clearance, coughing and/or vibration techniques. Create an Sports administrator.   Pulmonary Rehab from 07/15/2016 in [facility] Cardiac and Pulmonary Rehab  Date  05/25/16  Educator  LB  Instruction Review Code (retired)  2- meets goals/outcomes      Oxygen: - Individual and group verbal and written instruction on oxygen therapy. Includes supplement oxygen, available portable  oxygen systems, continuous and intermittent flow rates, oxygen safety, concentrators, and Medicare reimbursement for oxygen.   Respiratory Medications: - Group verbal and written instruction to review medications for lung disease. Drug class, frequency, complications, importance of spacers, rinsing mouth after steroid MDI's, and proper cleaning methods for nebulizers.   Pulmonary Rehab from 07/15/2016 in [facility] Cardiac and Pulmonary Rehab  Date  04/12/16  Educator  LB  Instruction Review Code (retired)  2- meets goals/outcomes      AED/CPR: - Group verbal and written instruction  with the use of models to demonstrate the basic use of the AED with the basic ABC's of resuscitation.   Breathing Retraining: - Provides individuals verbal and written instruction on purpose, frequency, and proper technique of diaphragmatic breathing and pursed-lipped breathing. Applies individual practice skills.   Pulmonary Rehab from 05/02/2017 in [facility] Cardiac and Pulmonary Rehab  Date  05/02/17  Educator  [facility]  Instruction Review Code  1- Verbalizes Understanding      Anatomy and Physiology of the Lungs: - Group verbal and written instruction with the use of models to provide basic lung anatomy and physiology related to function, structure and complications of lung disease.   Pulmonary Rehab from 07/15/2016 in [facility] Cardiac and Pulmonary Rehab  Date  07/01/16  Educator  LB  Instruction Review Code (retired)  2- meets Designer, fashion/clothing & Physiology of the Heart: - Group verbal and written instruction and models provide basic cardiac anatomy and physiology, with the coronary electrical and arterial systems. Review of: AMI, Angina, Valve disease, Heart Failure, Cardiac Arrhythmia, Pacemakers, and the ICD.   Heart Failure: - Group verbal and written instruction on the basics of heart failure: signs/symptoms, treatments, explanation of ejection fraction, enlarged heart and cardiomyopathy.   Pulmonary  Rehab from 07/15/2016 in [facility] Cardiac and Pulmonary Rehab  Date  05/27/16  Educator  CE  Instruction Review Code (retired)  2- meets goals/outcomes      Sleep Apnea: - Individual verbal and written instruction to review Obstructive Sleep Apnea. Review of risk factors, methods for diagnosing and types of masks and machines for OSA.   Anxiety: - Provides group, verbal and written instruction on the correlation between heart/lung disease and anxiety, treatment options, and management of anxiety.   Pulmonary Rehab from 07/15/2016 in [facility] Cardiac and Pulmonary Rehab  Date  07/13/16  Educator  [facility]  Instruction Review Code (retired)  2- Water quality scientist      Relaxation: - Provides group, verbal and written instruction about the benefits of relaxation for patients with heart/lung disease. Also provides patients with examples of relaxation techniques.   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 Reviews the medication, class of the drug, and side effects.   Pulmonary Rehab from 07/15/2016 in [facility] Cardiac and Pulmonary Rehab  Date  06/24/16  Educator  CE  Instruction Review Code (retired)  2- meets goals/outcomes      Know Your Numbers: -Group verbal and written instruction about important numbers in your health.  Review of Cholesterol, Blood Pressure, Diabetes, and BMI and the role they play in your overall health.   Other: -Provides group and verbal instruction on various topics (see comments)    Knowledge Questionnaire Score: Knowledge Questionnaire Score - 05/02/17 1107      Knowledge Questionnaire Score   Pre Score  14/18 Reviewed with patient        Core Components/Risk Factors/Patient Goals at Admission: Personal Goals and Risk  Factors at Admission - 05/02/17 1111      Core Components/Risk Factors/Patient Goals on Admission    Weight Management  Yes;Weight Loss    Intervention  Weight Management: Develop a combined nutrition and exercise program designed to reach desired caloric intake, while maintaining appropriate intake of nutrient and fiber, sodium  and fats, and appropriate energy expenditure required for the weight goal.;Weight Management: Provide education and appropriate resources to help participant work on and attain dietary goals.;Obesity: Provide education and appropriate resources to help participant work on and attain dietary goals.    Admit Weight  219 lb 4.8 oz (99.5 kg)    Goal Weight: Short Term  214 lb (97.1 kg)    Goal Weight: Long Term  180 lb (81.6 kg)    Expected Outcomes  Short Term: Continue to assess and modify interventions until short term weight is achieved;Long Term: Adherence to nutrition and physical activity/exercise program aimed toward attainment of established weight goal;Weight Loss: Understanding of general recommendations for a balanced deficit meal plan, which promotes 1-2 lb weight loss per week and includes a negative energy balance of [phone] kcal/d;Understanding recommendations for meals to include 15-35% energy as protein, 25-35% energy from fat, 35-60% energy from carbohydrates, less than 200mg  of dietary cholesterol, 20-35 gm of total fiber daily;Understanding of distribution of calorie intake throughout the day with the consumption of 4-5 meals/snacks    Improve shortness of breath with ADL's  Yes    Intervention  Provide education, individualized exercise plan and daily activity instruction to help decrease symptoms of SOB with activities of daily living.    Expected Outcomes  Short Term: Improve cardiorespiratory fitness to achieve a reduction of symptoms when performing ADLs;Long Term: Be able to perform more ADLs without symptoms or delay the onset of symptoms    Diabetes  Yes  pre diabetic    Intervention  Provide education about signs/symptoms and action to take for hypo/hyperglycemia.;Provide education about proper nutrition, including hydration, and aerobic/resistive exercise prescription along with prescribed medications to achieve blood glucose in normal ranges: Fasting glucose 65-99 mg/dL    Expected Outcomes  Short Term: Participant verbalizes understanding of the signs/symptoms and immediate care of hyper/hypoglycemia, proper foot care and importance of medication, aerobic/resistive exercise and nutrition plan for blood glucose control.;Long Term: Attainment of HbA1C < 7%.    Hypertension  Yes    Intervention  Provide education on lifestyle modifcations including regular physical activity/exercise, weight management, moderate sodium restriction and increased consumption of fresh fruit, vegetables, and low fat dairy, alcohol moderation, and smoking cessation.;Monitor prescription use compliance.    Expected Outcomes  Short Term: Continued assessment and intervention until BP is < 140/65mm HG in hypertensive participants. < 130/18mm HG in hypertensive participants with diabetes, heart failure or chronic kidney disease.;Long Term: Maintenance of blood pressure at goal levels.    Lipids  Yes    Intervention  Provide education and support for participant on nutrition & aerobic/resistive exercise along with prescribed medications to achieve LDL 70mg , HDL >40mg .    Expected Outcomes  Short Term: Participant states understanding of desired cholesterol values and is compliant with medications prescribed. Participant is following exercise prescription and nutrition guidelines.;Long Term: Cholesterol controlled with medications as prescribed, with individualized exercise RX and with personalized nutrition plan. Value goals: LDL < 70mg , HDL > 40 mg.       Core Components/Risk Factors/Patient Goals Review:    Core Components/Risk Factors/Patient Goals at Discharge (Final  Review):    ITP Comments: ITP Comments    Row Name 05/02/17 1043 05/02/17 1044         ITP Comments  -  Medical Evaluation completed. Chart sent for review and changes to Dr. Emily Filbert Director of [facility]. Diagnosis can be found in [facility] encounter 05/02/16  Comments: Initial ITP

## 2017-05-05 DIAGNOSIS — J449 Chronic obstructive pulmonary disease, unspecified: Secondary | ICD-10-CM

## 2017-05-05 DIAGNOSIS — J441 Chronic obstructive pulmonary disease with (acute) exacerbation: Secondary | ICD-10-CM | POA: Diagnosis not present

## 2017-05-05 LAB — GLUCOSE, CAPILLARY
Glucose-Capillary: 101 mg/dL — ABNORMAL HIGH (ref 65–99)
Glucose-Capillary: 107 mg/dL — ABNORMAL HIGH (ref 65–99)

## 2017-05-05 NOTE — Progress Notes (Signed)
Daily Session Note  Patient Details  Name: Melvin Brewer MRN: [id number] Date of Birth: [birth year] Referring Provider:     Pulmonary Rehab from 05/02/2017 in [facility] Cardiac and Pulmonary Rehab  Referring Provider  [place]      Encounter Date: 05/05/2017  Check In: Session Check In - 05/05/17 0946      Check-In   Location  [facility]-Cardiac & Pulmonary Rehab    Staff Present  Justin Mend RCP,RRT,BSRT;Amanda Oletta Darter, BA, ACSM CEP, Exercise Physiologist;Mandi Zachery Conch, BS, [place] physician immediately available to respond to emergencies  [facility] immediately available ER MD    Physician(s)  Dr. Mariea Clonts and Jacqualine Code    Medication changes reported      No    Fall or balance concerns reported     No    Tobacco Cessation  No Change patient does not smoke    Warm-up and Cool-down  Performed as group-led instruction    Resistance Training Performed  Yes    VAD Patient?  No      Pain Assessment   Currently in Pain?  No/denies          Social History   Tobacco Use  Smoking Status Former Smoker  . Packs/day: 2.00  . Years: 43.00  . Pack years: 86.00  . Types: Cigarettes  . Last attempt to quit: 04/11/2004  . Years since quitting: 13.0  Smokeless Tobacco Never Used    Goals Met:  Exercise tolerated well Personal goals reviewed Queuing for purse lip breathing No report of cardiac concerns or symptoms Strength training completed today  Goals Unmet:  Not Applicable  Comments: First full day of exercise!  Patient was oriented to gym and equipment including functions, settings, policies, and procedures.  Patient's individual exercise prescription and treatment plan were reviewed.  All starting workloads were established based on the results of the 6 minute walk test done at initial orientation visit.  The plan for exercise progression was also introduced and progression will be customized based on patient's performance and goals.   Dr. Emily Filbert is Medical Director for  [facility] and [facility].

## 2017-05-08 DIAGNOSIS — J449 Chronic obstructive pulmonary disease, unspecified: Secondary | ICD-10-CM

## 2017-05-08 DIAGNOSIS — J441 Chronic obstructive pulmonary disease with (acute) exacerbation: Secondary | ICD-10-CM | POA: Diagnosis not present

## 2017-05-08 LAB — GLUCOSE, CAPILLARY
Glucose-Capillary: 114 mg/dL — ABNORMAL HIGH (ref 65–99)
Glucose-Capillary: 105 mg/dL — ABNORMAL HIGH (ref 65–99)

## 2017-05-08 NOTE — Progress Notes (Signed)
Daily Session Note  Patient Details  Name: Melvin Brewer MRN: [id number] Date of Birth: 1950/06/26 Referring Provider:     Pulmonary Rehab from 05/02/2017 in [facility] Cardiac and Pulmonary Rehab  Referring Provider  [place]      Encounter Date: 05/08/2017  Check In: Session Check In - 05/08/17 1016      Check-In   Location  [facility]-Cardiac & Pulmonary Rehab    Staff Present  Nada Maclachlan, BA, ACSM CEP, Exercise Physiologist;Kelly Amedeo Plenty, BS, ACSM CEP, Exercise Physiologist;Franco Duley Flavia Shipper    Supervising physician immediately available to respond to emergencies  [facility] immediately available ER MD    Physician(s)  Dr. Lamar Laundry and Mariea Clonts    Medication changes reported      No    Fall or balance concerns reported     No    Warm-up and Cool-down  Performed as group-led instruction    Resistance Training Performed  Yes    VAD Patient?  No      Pain Assessment   Currently in Pain?  No/denies          Social History   Tobacco Use  Smoking Status Former Smoker  . Packs/day: 2.00  . Years: 43.00  . Pack years: 86.00  . Types: Cigarettes  . Last attempt to quit: 04/11/2004  . Years since quitting: 13.0  Smokeless Tobacco Never Used    Goals Met:  Independence with exercise equipment Exercise tolerated well No report of cardiac concerns or symptoms Strength training completed today  Goals Unmet:  Not Applicable  Comments: Pt able to follow exercise prescription today without complaint.  Will continue to monitor for progression.   Dr. Emily Filbert is Medical Director for [place] and [facility].

## 2017-05-10 DIAGNOSIS — J441 Chronic obstructive pulmonary disease with (acute) exacerbation: Secondary | ICD-10-CM | POA: Diagnosis not present

## 2017-05-10 DIAGNOSIS — J449 Chronic obstructive pulmonary disease, unspecified: Secondary | ICD-10-CM

## 2017-05-10 LAB — GLUCOSE, CAPILLARY
Glucose-Capillary: 130 mg/dL — ABNORMAL HIGH (ref 65–99)
Glucose-Capillary: 101 mg/dL — ABNORMAL HIGH (ref 65–99)

## 2017-05-10 NOTE — Progress Notes (Signed)
Daily Session Note  Patient Details  Name: Melvin Brewer MRN: [id number] Date of Birth: Feb 13, 1951 Referring Provider:     Pulmonary Rehab from 05/02/2017 in [facility] Cardiac and Pulmonary Rehab  Referring Provider  [place]      Encounter Date: 05/10/2017  Check In: Session Check In - 05/10/17 1012      Check-In   Location  [facility]-Cardiac & Pulmonary Rehab    Staff Present  Nada Maclachlan, BA, ACSM CEP, Exercise Physiologist;Phallon Haydu Darrin Nipper, [place], RCEP, CCRP, Exercise Physiologist    Supervising physician immediately available to respond to emergencies  [facility] immediately available ER MD    Physician(s)  Dr. Jimmye Norman and Alfred Levins    Medication changes reported      No    Fall or balance concerns reported     No    Warm-up and Cool-down  Performed as group-led instruction    Resistance Training Performed  Yes    VAD Patient?  No      Pain Assessment   Currently in Pain?  No/denies          Social History   Tobacco Use  Smoking Status Former Smoker  . Packs/day: 2.00  . Years: 43.00  . Pack years: 86.00  . Types: Cigarettes  . Last attempt to quit: 04/11/2004  . Years since quitting: 13.0  Smokeless Tobacco Never Used    Goals Met:  Independence with exercise equipment Exercise tolerated well No report of cardiac concerns or symptoms Strength training completed today  Goals Unmet:  Not Applicable  Comments: Pt able to follow exercise prescription today without complaint.  Will continue to monitor for progression.   Dr. Emily Filbert is Medical Director for [place] and [facility].

## 2017-05-12 ENCOUNTER — Encounter: Payer: Medicare Other | Attending: Family Medicine

## 2017-05-12 DIAGNOSIS — J441 Chronic obstructive pulmonary disease with (acute) exacerbation: Secondary | ICD-10-CM | POA: Insufficient documentation

## 2017-05-15 ENCOUNTER — Encounter: Payer: Self-pay | Admitting: Dietician

## 2017-05-15 ENCOUNTER — Encounter: Payer: Medicare Other | Admitting: Dietician

## 2017-05-15 VITALS — BP 164/82 | Ht 69.0 in | Wt 216.3 lb

## 2017-05-15 DIAGNOSIS — J441 Chronic obstructive pulmonary disease with (acute) exacerbation: Secondary | ICD-10-CM | POA: Diagnosis present

## 2017-05-15 DIAGNOSIS — E119 Type 2 diabetes mellitus without complications: Secondary | ICD-10-CM

## 2017-05-15 DIAGNOSIS — J449 Chronic obstructive pulmonary disease, unspecified: Secondary | ICD-10-CM

## 2017-05-15 NOTE — Progress Notes (Signed)
Diabetes Self-Management Education  Visit Type: First/Initial  Appt. Start Time: 1330 Appt. End Time: 1430  05/15/2017  [name], identified by name and date of birth, is a 67 y.o. male with a diagnosis of Diabetes: Type 2.   ASSESSMENT  Blood pressure (!) 164/82, height 5\' 9"  (1.753 m), weight 216 lb 4.8 oz (98.1 kg). Body mass index is 31.94 kg/m.  Diabetes Self-Management Education - 05/15/17 1441      Visit Information   Visit Type  First/Initial      Initial Visit   Diabetes Type  Type 2      Health Coping   How would you rate your overall health?  Fair      Psychosocial Assessment   Patient Belief/Attitude about Diabetes  Motivated to manage diabetes    Self-care barriers  None    Self-management support  Doctor's office;Family    Other persons present  Patient    Patient Concerns  Weight Control;Glycemic Control;Healthy Lifestyle prevent complications and become more fit    Special Needs  None    Preferred Learning Style  Hands on;Auditory    Learning Readiness  Ready    What is the last grade level you completed in school?  2 years college      Pre-Education Assessment   Patient understands the diabetes disease and treatment process.  Needs Instruction    Patient understands incorporating nutritional management into lifestyle.  Needs Instruction    Patient undertands incorporating physical activity into lifestyle.  Needs Instruction    Patient understands using medications safely.  Needs Review    Patient understands monitoring blood glucose, interpreting and using results  Needs Review    Patient understands prevention, detection, and treatment of acute complications.  Needs Instruction    Patient understands prevention, detection, and treatment of chronic complications.  Needs Instruction    Patient understands how to develop strategies to address psychosocial issues.  Needs Instruction    Patient understands how to develop strategies to promote  health/change behavior.  Needs Instruction      Complications   Last HgB A1C per patient/outside source  6.8 % 04-12-17    How often do you check your blood sugar?  1-2 times/day    Fasting Blood glucose range (mg/dL)  70-129    Postprandial Blood glucose range (mg/dL)  130-179 checked pp x1=results 130    Have you had a dilated eye exam in the past 12 months?  Yes 10-2016    Have you had a dental exam in the past 12 months?  Yes 05-2017    Are you checking your feet?  Yes    How many days per week are you checking your feet?  7      Dietary Intake   Breakfast  eats breakfast at 7-8a=usually eats oatmeal or cold cereal with milk    Snack (morning)  none    Lunch  eats lunch at 12p-2p=eats sandwich or salad; eats out 2-3x/wk   Snack (afternoon)  none    Dinner   eats supper at 6-7p=eats meat and vegetables; eats fried foods 2-3x/wk ; eats out 2-3x/wk   Snack (evening)  eats popcorn or crackers and cheese    Beverage(s)  drinks water 6-7x/day, milk 1x/day      Exercise   Exercise Type  Light (walking / raking leaves) dose pulmonary rehab 1.5 hr 3x/wk    How many days per week to you exercise?  3    How many minutes  per day do you exercise?  90    Total minutes per week of exercise  270      Patient Education   Previous Diabetes Education  No    Disease state   Definition of diabetes, type 1 and 2, and the diagnosis of diabetes    Nutrition management   Role of diet in the treatment of diabetes and the relationship between the three main macronutrients and blood glucose level;Food label reading, portion sizes and measuring food.;Carbohydrate counting    Physical activity and exercise   Role of exercise on diabetes management, blood pressure control and cardiac health.    Medications  Reviewed patients medication for diabetes, action, purpose, timing of dose and side effects.    Monitoring  Purpose and frequency of SMBG.;Identified appropriate SMBG and/or A1C goals.;Taught/discussed  recording of test results and interpretation of SMBG.;Daily foot exams    Chronic complications  Relationship between chronic complications and blood glucose control;Assessed and discussed foot care and prevention of foot problems;Nephropathy, what it is, prevention of, the use of ACE, ARB's and early detection of through urine microalbumia.;Retinopathy and reason for yearly dilated eye exams;Reviewed with patient heart disease, higher risk of, and prevention;Lipid levels, blood glucose control and heart disease    Personal strategies to promote health  Lifestyle issues that need to be addressed for better diabetes care;Helped patient develop diabetes management plan for (enter comment)      Outcomes   Expected Outcomes  Demonstrated interest in learning. Expect positive outcomes       Individualized Plan for Diabetes Self-Management Training:   Learning Objective:  Patient will have a greater understanding of diabetes self-management. Patient education plan is to attend individual and/or group sessions per assessed needs and concerns.   Plan:   Patient Instructions   Check blood sugars 2 x day before breakfast and 2 hrs after supper every day and record Bring blood sugar records to the next appointment/class Exercise: continue Pulmonary Rehab 1.5 hr 3x/wk.  Eat 3 meals day   1  snack a day Eat 1 carbohydrate serving/snack + protein Eat 3 carbohydrate servings/meal + protein Space meals 4-6 hours apart Limit intake of sweets and fried foods Make healthy food choices Avoid sugar sweetened drinks (soda, tea, coffee, sports drinks, juices) Get a Sharps container Return for appointment/classes on:  06-12-17   Expected Outcomes:  Demonstrated interest in learning. Expect positive outcomes  Education material provided: General meal planning guidelines, Food group handout  If problems or questions, patient to contact team via: [phone]  Future DSME appointment:  06-12-17

## 2017-05-15 NOTE — Progress Notes (Signed)
Daily Session Note  Patient Details  Name: Melvin Brewer MRN: [id number] Date of Birth: 09-21-1950 Referring Provider:     Pulmonary Rehab from 05/02/2017 in [facility] Cardiac and Pulmonary Rehab  Referring Provider  [facility]      Encounter Date: 05/15/2017  Check In: Session Check In - 05/15/17 0954      Check-In   Location  [facility]-Cardiac & Pulmonary Rehab    Staff Present  Nada Maclachlan, BA, ACSM CEP, Exercise Physiologist;Kelly Amedeo Plenty, BS, ACSM CEP, Exercise Physiologist;Phyliss Hulick Flavia Shipper    Supervising physician immediately available to respond to emergencies  [facility] immediately available ER MD    Physician(s)  Dr. Quentin Cornwall and Jimmye Norman    Medication changes reported      No    Fall or balance concerns reported     No    Warm-up and Cool-down  Performed as group-led instruction    Resistance Training Performed  Yes    VAD Patient?  No      Pain Assessment   Currently in Pain?  No/denies          Social History   Tobacco Use  Smoking Status Former Smoker  . Packs/day: 2.00  . Years: 43.00  . Pack years: 86.00  . Types: Cigarettes  . Last attempt to quit: 04/11/2004  . Years since quitting: 13.1  Smokeless Tobacco Never Used    Goals Met:  Independence with exercise equipment Exercise tolerated well No report of cardiac concerns or symptoms Strength training completed today  Goals Unmet:  Not Applicable  Comments: Pt able to follow exercise prescription today without complaint.  Will continue to monitor for progression.   Dr. Emily Filbert is Medical Director for [place] and [facility].

## 2017-05-15 NOTE — Patient Instructions (Addendum)
  Check blood sugars 2 x day before breakfast and 2 hrs after supper every day and record Bring blood sugar records to the next appointment/class Exercise: continue Pulmonary Rehab 1.5 hr 3x/wk.  Eat 3 meals day   1  snack a day Eat 1 carbohydrate serving/snack + protein Eat 3 carbohydrate servings/meal + protein Space meals 4-6 hours apart Limit intake of sweets and fried foods Make healthy food choices Avoid sugar sweetened drinks (soda, tea, coffee, sports drinks, juices) Get a Sharps container Return for appointment/classes on:  06-12-17

## 2017-05-19 DIAGNOSIS — J449 Chronic obstructive pulmonary disease, unspecified: Secondary | ICD-10-CM

## 2017-05-19 DIAGNOSIS — J441 Chronic obstructive pulmonary disease with (acute) exacerbation: Secondary | ICD-10-CM | POA: Diagnosis not present

## 2017-05-19 NOTE — Progress Notes (Signed)
Daily Session Note  Patient Details  Name: Melvin Brewer MRN: [id number] Date of Birth: 07/31/1950 Referring Provider:     Pulmonary Rehab from 05/02/2017 in [facility] Cardiac and Pulmonary Rehab  Referring Provider  [facility]      Encounter Date: 05/19/2017  Check In: Session Check In - 05/19/17 1009      Check-In   Location  [facility]-Cardiac & Pulmonary Rehab    Staff Present  Justin Mend RCP,RRT,BSRT;Amanda Oletta Darter, BA, ACSM CEP, Exercise Physiologist;Meredith Sherryll Burger, RN BSN    Supervising physician immediately available to respond to emergencies  [facility] immediately available ER MD    Physician(s)  Dr. Cinda Quest and Reita Cliche    Medication changes reported      No    Fall or balance concerns reported     No    Warm-up and Cool-down  Performed as group-led instruction    Resistance Training Performed  Yes    VAD Patient?  No      Pain Assessment   Currently in Pain?  No/denies          Social History   Tobacco Use  Smoking Status Former Smoker  . Packs/day: 2.00  . Years: 43.00  . Pack years: 86.00  . Types: Cigarettes  . Last attempt to quit: 04/11/2004  . Years since quitting: 13.1  Smokeless Tobacco Never Used    Goals Met:  Independence with exercise equipment Exercise tolerated well No report of cardiac concerns or symptoms Strength training completed today  Goals Unmet:  Not Applicable  Comments: Pt able to follow exercise prescription today without complaint.  Will continue to monitor for progression.   Dr. Emily Filbert is Medical Director for [facility] and [facility].

## 2017-05-22 DIAGNOSIS — J441 Chronic obstructive pulmonary disease with (acute) exacerbation: Secondary | ICD-10-CM | POA: Diagnosis not present

## 2017-05-22 DIAGNOSIS — J449 Chronic obstructive pulmonary disease, unspecified: Secondary | ICD-10-CM

## 2017-05-22 NOTE — Progress Notes (Signed)
Pulmonary Individual Treatment Plan  Patient Details  Name: Melvin Brewer MRN: [id number] Date of Birth: 06-05-1950 Referring Provider:     Pulmonary Rehab from 05/02/2017 in [facility] Cardiac and Pulmonary Rehab  Referring Provider  Grandis      Initial Encounter Date:    Pulmonary Rehab from 05/02/2017 in [facility] Cardiac and Pulmonary Rehab  Date  05/02/17  Referring Provider  Grandis      Visit Diagnosis: Chronic obstructive pulmonary disease, unspecified COPD type ([place])  Patient's Home Medications on Admission:  Current Outpatient Medications:  .  albuterol (PROVENTIL HFA;VENTOLIN HFA) 108 (90 BASE) MCG/ACT inhaler, Inhale 2 puffs into the lungs every 6 (six) hours as needed for wheezing or shortness of breath., Disp: , Rfl:  .  amLODipine (NORVASC) 2.5 MG tablet, Take 1 tablet by mouth daily., Disp: , Rfl:  .  aspirin EC 81 MG tablet, Take 1 tablet by mouth daily., Disp: , Rfl:  .  atorvastatin (LIPITOR) 40 MG tablet, Take 1 tablet by mouth daily., Disp: , Rfl:  .  budesonide-formoterol (SYMBICORT) 160-4.5 MCG/ACT inhaler, Place 2 sprays into the nose 2 (two) times daily., Disp: , Rfl:  .  Cetirizine HCl (ZYRTEC ALLERGY) 10 MG CAPS, Take 1 capsule by mouth daily., Disp: , Rfl:  .  Cholecalciferol (VITAMIN D-1000 MAX ST) 1000 units tablet, Take 1 tablet by mouth daily., Disp: , Rfl:  .  DENTA 5000 PLUS 1.1 % CREA dental cream, Take 1 application by mouth 3 (three) times daily. Brush teeth for at least 1 min 2-3 times a day, Disp: , Rfl: 5 .  docusate sodium 100 MG CAPS, Take 100 mg by mouth 2 (two) times daily. (Patient not taking: Reported on 05/15/2017), Disp: 60 capsule, Rfl: 0 .  fluticasone (FLONASE) 50 MCG/ACT nasal spray, Place 1 spray into both nostrils daily., Disp: , Rfl:  .  metFORMIN (GLUCOPHAGE) 500 MG tablet, Take 1 tablet by mouth daily., Disp: , Rfl:  .  metoprolol (LOPRESSOR) 50 MG tablet, Take 50 mg by mouth 2 (two) times daily., Disp: , Rfl:  .  theophylline  (UNIPHYL) 400 MG 24 hr tablet, Take 1 tablet by mouth daily., Disp: , Rfl:  .  Tiotropium Bromide Monohydrate (SPIRIVA RESPIMAT) 2.5 MCG/ACT AERS, Inhale 2 Inhalers into the lungs daily., Disp: , Rfl:  .  triamcinolone cream (KENALOG) 0.5 %, Apply 1 application topically as needed., Disp: , Rfl:   Past Medical History: Past Medical History:  Diagnosis Date  . Abdominal aortic aneurysm (AAA) ([place])   . COPD (chronic obstructive pulmonary disease) ([place])   . Coronary artery disease   . H/O hiatal hernia    AGE 39 S  NO MEDS  . Hyperlipidemia   . Hypertension   . Shortness of breath    W/ EXERTION     Tobacco Use: Social History   Tobacco Use  Smoking Status Former Smoker  . Packs/day: 2.00  . Years: 43.00  . Pack years: 86.00  . Types: Cigarettes  . Last attempt to quit: 04/11/2004  . Years since quitting: 13.1  Smokeless Tobacco Never Used    Labs: Recent Review Flowsheet Data    There is no flowsheet data to display.       Pulmonary Assessment Scores: Pulmonary Assessment Scores    Row Name 05/02/17 1106         ADL UCSD   ADL Phase  Entry     SOB Score total  44     Rest  0  Walk  0     Stairs  3     Bath  2     Dress  2     Shop  2       CAT Score   CAT Score  20       mMRC Score   mMRC Score  1        Pulmonary Function Assessment: Pulmonary Function Assessment - 05/02/17 1112      Initial Spirometry Results   FVC%  31 %    FEV1%  34 %    FEV1/FVC Ratio  74.02    Comments  Good patient effort      Post Bronchodilator Spirometry Results   FVC%  36.58 %    FEV1%  36.72 %    FEV1/FVC Ratio  73.38    Comments  Good patient effort      Breath   Bilateral Breath Sounds  Clear;Decreased    Shortness of Breath  Yes;Limiting activity       Exercise Target Goals:    Exercise Program Goal: Individual exercise prescription set using results from initial 6 min walk test and THRR while considering  patient's activity barriers and safety.     Exercise Prescription Goal: Initial exercise prescription builds to 30-45 minutes a day of aerobic activity, 2-3 days per week.  Home exercise guidelines will be given to patient during program as part of exercise prescription that the participant will acknowledge.  Activity Barriers & Risk Stratification:   6 Minute Walk: 6 Minute Walk    Row Name 05/02/17 1217         6 Minute Walk   Distance  1520 feet     Walk Time  6 minutes     # of Rest Breaks  0     MPH  2.87     METS  3.3     RPE  15     Perceived Dyspnea   2     VO2 Peak  11.6     Symptoms  No     Resting HR  72 bpm     Resting BP  128/64     Resting Oxygen Saturation   97 %     Exercise Oxygen Saturation  during 6 min walk  90 %     Max Ex. HR  110 bpm     Max Ex. BP  134/78     2 Minute Post BP  128/72       Interval HR   1 Minute HR  101     2 Minute HR  106     3 Minute HR  103     5 Minute HR  110     6 Minute HR  100     Interval Heart Rate?  Yes       Interval Oxygen   Interval Oxygen?  Yes     Baseline Oxygen Saturation %  97 %     1 Minute Oxygen Saturation %  91 %     1 Minute Liters of Oxygen  0 L     2 Minute Oxygen Saturation %  90 %     2 Minute Liters of Oxygen  0 L     3 Minute Oxygen Saturation %  94 %     3 Minute Liters of Oxygen  0 L     4 Minute Liters of Oxygen  0 L     5 Minute  Oxygen Saturation %  93 %     5 Minute Liters of Oxygen  0 L     6 Minute Oxygen Saturation %  94 %     6 Minute Liters of Oxygen  0 L     2 Minute Post Oxygen Saturation %  97 %     2 Minute Post Liters of Oxygen  0 L       Oxygen Initial Assessment: Oxygen Initial Assessment - 05/02/17 1110      Home Oxygen   Home Oxygen Device  None    Sleep Oxygen Prescription  None    Home Exercise Oxygen Prescription  None    Home at Rest Exercise Oxygen Prescription  None      Initial 6 min Walk   Oxygen Used  None      Program Oxygen Prescription   Program Oxygen Prescription  None       Intervention   Short Term Goals  To learn and understand importance of maintaining oxygen saturations>88%;To learn and demonstrate proper use of respiratory medications;To learn and demonstrate proper pursed lip breathing techniques or other breathing techniques.;To learn and understand importance of monitoring SPO2 with pulse oximeter and demonstrate accurate use of the pulse oximeter.    Long  Term Goals  Verbalizes importance of monitoring SPO2 with pulse oximeter and return demonstration;Maintenance of O2 saturations>88%;Compliance with respiratory medication;Demonstrates proper use of MDI's;Exhibits proper breathing techniques, such as pursed lip breathing or other method taught during program session       Oxygen Re-Evaluation: Oxygen Re-Evaluation    Row Name 05/05/17 0950             Goals/Expected Outcomes   Short Term Goals  To learn and understand importance of maintaining oxygen saturations>88%;To learn and demonstrate proper use of respiratory medications;To learn and demonstrate proper pursed lip breathing techniques or other breathing techniques.;To learn and understand importance of monitoring SPO2 with pulse oximeter and demonstrate accurate use of the pulse oximeter.       Long  Term Goals  Verbalizes importance of monitoring SPO2 with pulse oximeter and return demonstration;Maintenance of O2 saturations>88%;Compliance with respiratory medication;Demonstrates proper use of MDI's;Exhibits proper breathing techniques, such as pursed lip breathing or other method taught during program session       Comments  Reviewed PLB technique with pt.  Talked about how it work and it's important to maintaining his exercise saturations.         Goals/Expected Outcomes  Short: Become more profiecient at using PLB.   Long: Become independent at using PLB.          Oxygen Discharge (Final Oxygen Re-Evaluation): Oxygen Re-Evaluation - 05/05/17 0950      Goals/Expected Outcomes   Short Term  Goals  To learn and understand importance of maintaining oxygen saturations>88%;To learn and demonstrate proper use of respiratory medications;To learn and demonstrate proper pursed lip breathing techniques or other breathing techniques.;To learn and understand importance of monitoring SPO2 with pulse oximeter and demonstrate accurate use of the pulse oximeter.    Long  Term Goals  Verbalizes importance of monitoring SPO2 with pulse oximeter and return demonstration;Maintenance of O2 saturations>88%;Compliance with respiratory medication;Demonstrates proper use of MDI's;Exhibits proper breathing techniques, such as pursed lip breathing or other method taught during program session    Comments  Reviewed PLB technique with pt.  Talked about how it work and it's important to maintaining his exercise saturations.      Goals/Expected Outcomes  Short:  Become more profiecient at using PLB.   Long: Become independent at using PLB.       Initial Exercise Prescription: Initial Exercise Prescription - 05/02/17 1200      Date of Initial Exercise RX and Referring Provider   Date  05/02/17    Referring Provider  Grandis      Treadmill   MPH  2.5    Grade  1    Minutes  15      Recumbant Bike   Level  4    RPM  60    Watts  40    Minutes  15    METs  3.3      Elliptical   Level  1    Speed  2.5    Minutes  15      REL-XR   Level  3    Speed  50    Minutes  15    METs  3.3      Prescription Details   Frequency (times per week)  3    Duration  Progress to 45 minutes of aerobic exercise without signs/symptoms of physical distress      Intensity   THRR 40-80% of Max Heartrate  104-136    Ratings of Perceived Exertion  11-13    Perceived Dyspnea  0-4      Resistance Training   Training Prescription  Yes    Weight  4 lb    Reps  10-15       Perform Capillary Blood Glucose checks as needed.  Exercise Prescription Changes: Exercise Prescription Changes    Row Name 05/11/17 1000              Response to Exercise   Blood Pressure (Admit)  134/60       Blood Pressure (Exercise)  126/70       Blood Pressure (Exit)  134/70       Heart Rate (Admit)  65 bpm       Heart Rate (Exercise)  95 bpm       Heart Rate (Exit)  76 bpm       Oxygen Saturation (Admit)  96 %       Oxygen Saturation (Exercise)  92 %       Oxygen Saturation (Exit)  94 %       Rating of Perceived Exertion (Exercise)  13       Perceived Dyspnea (Exercise)  2       Symptoms  none       Duration  Progress to 45 minutes of aerobic exercise without signs/symptoms of physical distress       Intensity  THRR unchanged         Progression   Progression  Continue to progress workloads to maintain intensity without signs/symptoms of physical distress.       Average METs  2.9         Resistance Training   Training Prescription  Yes       Weight  4 lb       Reps  10-15         Interval Training   Interval Training  No          Exercise Comments: Exercise Comments    Row Name 05/05/17 [phone]           Exercise Comments  First full day of exercise!  Patient was oriented to gym and equipment including functions, settings, policies, and procedures.  Patient's individual exercise prescription and treatment plan were reviewed.  All starting workloads were established based on the results of the 6 minute walk test done at initial orientation visit.  The plan for exercise progression was also introduced and progression will be customized based on patient's performance and goals.          Exercise Goals and Review: Exercise Goals    Row Name 05/02/17 1216             Exercise Goals   Increase Physical Activity  Yes       Intervention  Provide advice, education, support and counseling about physical activity/exercise needs.;Develop an individualized exercise prescription for aerobic and resistive training based on initial evaluation findings, risk stratification, comorbidities and participant's  personal goals.       Expected Outcomes  Short Term: Attend rehab on a regular basis to increase amount of physical activity.;Long Term: Add in home exercise to make exercise part of routine and to increase amount of physical activity.;Long Term: Exercising regularly at least 3-5 days a week.       Increase Strength and Stamina  Yes       Intervention  Provide advice, education, support and counseling about physical activity/exercise needs.;Develop an individualized exercise prescription for aerobic and resistive training based on initial evaluation findings, risk stratification, comorbidities and participant's personal goals.       Expected Outcomes  Short Term: Increase workloads from initial exercise prescription for resistance, speed, and METs.;Short Term: Perform resistance training exercises routinely during rehab and add in resistance training at home;Long Term: Improve cardiorespiratory fitness, muscular endurance and strength as measured by increased METs and functional capacity (6MWT)       Able to understand and use rate of perceived exertion (RPE) scale  Yes       Intervention  Provide education and explanation on how to use RPE scale       Expected Outcomes  Short Term: Able to use RPE daily in rehab to express subjective intensity level;Long Term:  Able to use RPE to guide intensity level when exercising independently       Able to understand and use Dyspnea scale  Yes       Intervention  Provide education and explanation on how to use Dyspnea scale       Expected Outcomes  Short Term: Able to use Dyspnea scale daily in rehab to express subjective sense of shortness of breath during exertion;Long Term: Able to use Dyspnea scale to guide intensity level when exercising independently       Knowledge and understanding of Target Heart Rate Range (THRR)  Yes       Intervention  Provide education and explanation of THRR including how the numbers were predicted and where they are located for  reference       Expected Outcomes  Short Term: Able to state/look up THRR;Long Term: Able to use THRR to govern intensity when exercising independently;Short Term: Able to use daily as guideline for intensity in rehab       Able to check pulse independently  Yes       Intervention  Provide education and demonstration on how to check pulse in carotid and radial arteries.;Review the importance of being able to check your own pulse for safety during independent exercise       Expected Outcomes  Short Term: Able to explain why pulse checking is important during independent exercise;Long Term: Able to check pulse independently and accurately  Understanding of Exercise Prescription  Yes       Intervention  Provide education, explanation, and written materials on patient's individual exercise prescription       Expected Outcomes  Short Term: Able to explain program exercise prescription;Long Term: Able to explain home exercise prescription to exercise independently          Exercise Goals Re-Evaluation : Exercise Goals Re-Evaluation    Row Name 05/05/17 0949 05/11/17 1028           Exercise Goal Re-Evaluation   Exercise Goals Review  Understanding of Exercise Prescription;Able to understand and use Dyspnea scale;Knowledge and understanding of Target Heart Rate Range (THRR);Able to understand and use rate of perceived exertion (RPE) scale  Increase Physical Activity;Increase Strength and Stamina;Able to understand and use rate of perceived exertion (RPE) scale;Able to understand and use Dyspnea scale      Comments  Reviewed RPE scale, THR and program prescription with pt today.  Pt voiced understanding and was given a copy of goals to take home  Melvin Brewer is tolerating exercise well in his first week.  He is motivated to work hard in class.      Expected Outcomes  Short: Use RPE daily to regulate intensity.  Long: Follow program prescription in THR.  Short - Melvin Brewer will continue to attend regularly   Long - Melvin Brewer will improve overall fitness          Discharge Exercise Prescription (Final Exercise Prescription Changes): Exercise Prescription Changes - 05/11/17 1000      Response to Exercise   Blood Pressure (Admit)  134/60    Blood Pressure (Exercise)  126/70    Blood Pressure (Exit)  134/70    Heart Rate (Admit)  65 bpm    Heart Rate (Exercise)  95 bpm    Heart Rate (Exit)  76 bpm    Oxygen Saturation (Admit)  96 %    Oxygen Saturation (Exercise)  92 %    Oxygen Saturation (Exit)  94 %    Rating of Perceived Exertion (Exercise)  13    Perceived Dyspnea (Exercise)  2    Symptoms  none    Duration  Progress to 45 minutes of aerobic exercise without signs/symptoms of physical distress    Intensity  THRR unchanged      Progression   Progression  Continue to progress workloads to maintain intensity without signs/symptoms of physical distress.    Average METs  2.9      Resistance Training   Training Prescription  Yes    Weight  4 lb    Reps  10-15      Interval Training   Interval Training  No       Nutrition:  Target Goals: Understanding of nutrition guidelines, daily intake of sodium <1579m, cholesterol <2075m calories 30% from fat and 7% or less from saturated fats, daily to have 5 or more servings of fruits and vegetables.  Biometrics: Pre Biometrics - 05/02/17 1216      Pre Biometrics   Height  5' 10"  (1.778 m)    Weight  219 lb 9.6 oz (99.6 kg)    Waist Circumference  43 inches    Hip Circumference  43 inches    Waist to Hip Ratio  1 %    BMI (Calculated)  31.51        Nutrition Therapy Plan and Nutrition Goals: Nutrition Therapy & Goals - 05/02/17 1105      Personal Nutrition Goals   Comments  He would like to lose weight and eat healthier. He would also like to meet with the dietician.      Intervention Plan   Intervention  Prescribe, educate and counsel regarding individualized specific dietary modifications aiming towards targeted core  components such as weight, hypertension, lipid management, diabetes, heart failure and other comorbidities.;Nutrition handout(s) given to patient.    Expected Outcomes  Long Term Goal: Adherence to prescribed nutrition plan.;Short Term Goal: Understand basic principles of dietary content, such as calories, fat, sodium, cholesterol and nutrients.       Nutrition Assessments: Nutrition Assessments - 05/02/17 1126      MEDFICTS Scores   Pre Score  22       Nutrition Goals Re-Evaluation:   Nutrition Goals Discharge (Final Nutrition Goals Re-Evaluation):   Psychosocial: Target Goals: Acknowledge presence or absence of significant depression and/or stress, maximize coping skills, provide positive support system. Participant is able to verbalize types and ability to use techniques and skills needed for reducing stress and depression.   Initial Review & Psychosocial Screening: Initial Psych Review & Screening - 05/02/17 1104      Initial Review   Current issues with  None Identified      Family Dynamics   Good Support System?  Yes    Comments  His family is a great support system      Barriers   Psychosocial barriers to participate in program  The patient should benefit from training in stress management and relaxation.      Screening Interventions   Interventions  Encouraged to exercise       Quality of Life Scores:  Scores of 19 and below usually indicate a poorer quality of life in these areas.  A difference of  2-3 points is a clinically meaningful difference.  A difference of 2-3 points in the total score of the Quality of Life Index has been associated with significant improvement in overall quality of life, self-image, physical symptoms, and general health in studies assessing change in quality of life.  PHQ-9: Recent Review Flowsheet Data    Depression screen [facility] 2/9 05/15/2017 05/02/2017 07/11/2016 04/12/2016   Decreased Interest 0 0 0 0   Down, Depressed, Hopeless 0 0 0 0    PHQ - 2 Score 0 0 0 0   Altered sleeping - 1 0 1   Tired, decreased energy - 1 0 0   Change in appetite - 0 0 1   Feeling bad or failure about yourself  - 0 0 0   Trouble concentrating - 0 0 0   Moving slowly or fidgety/restless - 0 0 0   Suicidal thoughts - 0 0 0   PHQ-9 Score - 2 0 2   Difficult doing work/chores - Not difficult at all Not difficult at all Not difficult at all     Interpretation of Total Score  Total Score Depression Severity:  1-4 = Minimal depression, 5-9 = Mild depression, 10-14 = Moderate depression, 15-19 = Moderately severe depression, 20-27 = Severe depression   Psychosocial Evaluation and Intervention:   Psychosocial Re-Evaluation:   Psychosocial Discharge (Final Psychosocial Re-Evaluation):   Education: Education Goals: Education classes will be provided on a weekly basis, covering required topics. Participant will state understanding/return demonstration of topics presented.  Learning Barriers/Preferences: Learning Barriers/Preferences - 05/02/17 1237      Learning Barriers/Preferences   Learning Barriers  None    Learning Preferences  None       Education Topics:  Initial  Evaluation Education: - Verbal, written and demonstration of respiratory meds, oximetry and breathing techniques. Instruction on use of nebulizers and MDIs and importance of monitoring MDI activations.   Pulmonary Rehab from 05/15/2017 in [facility] Cardiac and Pulmonary Rehab  Date  05/02/17  Educator  [facility]  Instruction Review Code  1- Verbalizes Understanding      General Nutrition Guidelines/Fats and Fiber: -Group instruction provided by verbal, written material, models and posters to present the general guidelines for heart healthy nutrition. Gives an explanation and review of dietary fats and fiber.   Pulmonary Rehab from 05/15/2017 in [facility] Cardiac and Pulmonary Rehab  Date  05/08/17  Educator  CR  Instruction Review Code  1- Verbalizes Understanding      Controlling  Sodium/Reading Food Labels: -Group verbal and written material supporting the discussion of sodium use in heart healthy nutrition. Review and explanation with models, verbal and written materials for utilization of the food label.   Pulmonary Rehab from 05/15/2017 in [facility] Cardiac and Pulmonary Rehab  Date  05/15/17  Educator  PI  Instruction Review Code  1- Verbalizes Understanding      Exercise Physiology & General Exercise Guidelines: - Group verbal and written instruction with models to review the exercise physiology of the cardiovascular system and associated critical values. Provides general exercise guidelines with specific guidelines to those with heart or lung disease.    Pulmonary Rehab from 07/15/2016 in [facility] Cardiac and Pulmonary Rehab  Date  07/15/16  Educator  Nada Maclachlan, EP  Instruction Review Code (retired)  2- meets goals/outcomes      Aerobic Exercise & Resistance Training: - Gives group verbal and written instruction on the various components of exercise. Focuses on aerobic and resistive training programs and the benefits of this training and how to safely progress through these programs.   Pulmonary Rehab from 07/15/2016 in [facility] Cardiac and Pulmonary Rehab  Date  05/11/16  Educator  [facility]  Instruction Review Code (retired)  2- Statistician, Balance, Mind/Body Relaxation: Provides group verbal/written instruction on the benefits of flexibility and balance training, including mind/body exercise modes such as yoga, pilates and tai chi.  Demonstration and skill practice provided.   Pulmonary Rehab from 07/15/2016 in [facility] Cardiac and Pulmonary Rehab  Date  06/03/16  Educator  Nada Maclachlan  Instruction Review Code (retired)  2- meets goals/outcomes      Stress and Anxiety: - Provides group verbal and written instruction about the health risks of elevated stress and causes of high stress.  Discuss the correlation between heart/lung disease and  anxiety and treatment options. Review healthy ways to manage with stress and anxiety.   Pulmonary Rehab from 07/15/2016 in [facility] Cardiac and Pulmonary Rehab  Date  07/13/16  Educator  [facility]  Instruction Review Code (retired)  2- meets goals/outcomes      Depression: - Provides group verbal and written instruction on the correlation between heart/lung disease and depressed mood, treatment options, and the stigmas associated with seeking treatment.   Pulmonary Rehab from 07/15/2016 in [facility] Cardiac and Pulmonary Rehab  Date  06/15/16  Educator  [facility]  Instruction Review Code (retired)  2- meets goals/outcomes      Exercise & Equipment Safety: - Individual verbal instruction and demonstration of equipment use and safety with use of the equipment.   Pulmonary Rehab from 05/15/2017 in [facility] Cardiac and Pulmonary Rehab  Date  05/02/17  Educator  [facility]  Instruction Review Code  1- Verbalizes Understanding  Infection Prevention: - Provides verbal and written material to individual with discussion of infection control including proper hand washing and proper equipment cleaning during exercise session.   Pulmonary Rehab from 05/15/2017 in [facility] Cardiac and Pulmonary Rehab  Date  05/02/17  Educator  [facility]  Instruction Review Code  1- Verbalizes Understanding      Falls Prevention: - Provides verbal and written material to individual with discussion of falls prevention and safety.   Pulmonary Rehab from 05/15/2017 in [facility] Cardiac and Pulmonary Rehab  Date  05/02/17  Educator  [facility]  Instruction Review Code  1- Verbalizes Understanding      Diabetes: - Individual verbal and written instruction to review signs/symptoms of diabetes, desired ranges of glucose level fasting, after meals and with exercise. Advice that pre and post exercise glucose checks will be done for 3 sessions at entry of program.   Chronic Lung Diseases: - Group verbal and written instruction to review updates, respiratory medications,  advancements in procedures and treatments. Discuss use of supplemental oxygen including available portable oxygen systems, continuous and intermittent flow rates, concentrators, personal use and safety guidelines. Review proper use of inhaler and spacers. Provide informative websites for self-education.    Pulmonary Rehab from 07/15/2016 in [facility] Cardiac and Pulmonary Rehab  Date  05/04/16  Educator  [facility]  Instruction Review Code (retired)  2- meets Chief Financial Officer: - Provide group verbal and written instruction for methods to conserve energy, plan and organize activities. Instruct on pacing techniques, use of adaptive equipment and posture/positioning to relieve shortness of breath.   Pulmonary Rehab from 07/15/2016 in [facility] Cardiac and Pulmonary Rehab  Date  06/08/16  Educator  [facility]  Instruction Review Code (retired)  2- meets goals/outcomes      Triggers and Exacerbations: - Group verbal and written instruction to review types of environmental triggers and ways to prevent exacerbations. Discuss weather changes, air quality and the benefits of nasal washing. Review warning signs and symptoms to help prevent infections. Discuss techniques for effective airway clearance, coughing, and vibrations.   Pulmonary Rehab from 07/15/2016 in [facility] Cardiac and Pulmonary Rehab  Date  06/01/16  Educator  [facility]  Instruction Review Code (retired)  2- meets goals/outcomes      AED/CPR: - Group verbal and written instruction with the use of models to demonstrate the basic use of the AED with the basic ABC's of resuscitation.   Anatomy and Physiology of the Lungs: - Group verbal and written instruction with the use of models to provide basic lung anatomy and physiology related to function, structure and complications of lung disease.   Pulmonary Rehab from 07/15/2016 in [facility] Cardiac and Pulmonary Rehab  Date  07/01/16  Educator  [facility]  Instruction Review Code (retired)  2- meets Chief Technology Officer & Physiology of the Heart: - Group verbal and written instruction and models provide basic cardiac anatomy and physiology, with the coronary electrical and arterial systems. Review of Valvular disease and Heart Failure   Pulmonary Rehab from 05/15/2017 in [facility] Cardiac and Pulmonary Rehab  Date  05/10/17  Educator  [facility]  Instruction Review Code  1- Verbalizes Understanding      Cardiac Medications: - Group verbal and written instruction to review commonly prescribed medications for heart disease. Reviews the medication, class of the drug, and side effects.   Pulmonary Rehab from 07/15/2016 in [facility] Cardiac and Pulmonary Rehab  Date  06/24/16  Educator  CE  Instruction Review Code (retired)  2- meets goals/outcomes      Know Your Numbers and Risk Factors: -Group verbal and written instruction about important numbers in your health.  Discussion of what are risk factors and how they play a role in the disease process.  Review of Cholesterol, Blood Pressure, Diabetes, and BMI and the role they play in your overall health.   Sleep Hygiene: -Provides group verbal and written instruction about how sleep can affect your health.  Define sleep hygiene, discuss sleep cycles and impact of sleep habits. Review good sleep hygiene tips.    Other: -Provides group and verbal instruction on various topics (see comments)    Knowledge Questionnaire Score: Knowledge Questionnaire Score - 05/02/17 1107      Knowledge Questionnaire Score   Pre Score  14/18 Reviewed with patient        Core Components/Risk Factors/Patient Goals at Admission: Personal Goals and Risk Factors at Admission - 05/02/17 1111      Core Components/Risk Factors/Patient Goals on Admission    Weight Management  Yes;Weight Loss    Intervention  Weight Management: Develop a combined nutrition and exercise program designed to reach desired caloric intake, while maintaining appropriate intake of nutrient and fiber,  sodium and fats, and appropriate energy expenditure required for the weight goal.;Weight Management: Provide education and appropriate resources to help participant work on and attain dietary goals.;Obesity: Provide education and appropriate resources to help participant work on and attain dietary goals.    Admit Weight  219 lb 4.8 oz (99.5 kg)    Goal Weight: Short Term  214 lb (97.1 kg)    Goal Weight: Long Term  180 lb (81.6 kg)    Expected Outcomes  Short Term: Continue to assess and modify interventions until short term weight is achieved;Long Term: Adherence to nutrition and physical activity/exercise program aimed toward attainment of established weight goal;Weight Loss: Understanding of general recommendations for a balanced deficit meal plan, which promotes 1-2 lb weight loss per week and includes a negative energy balance of [phone] kcal/d;Understanding recommendations for meals to include 15-35% energy as protein, 25-35% energy from fat, 35-60% energy from carbohydrates, less than 273m of dietary cholesterol, 20-35 gm of total fiber daily;Understanding of distribution of calorie intake throughout the day with the consumption of 4-5 meals/snacks    Improve shortness of breath with ADL's  Yes    Intervention  Provide education, individualized exercise plan and daily activity instruction to help decrease symptoms of SOB with activities of daily living.    Expected Outcomes  Short Term: Improve cardiorespiratory fitness to achieve a reduction of symptoms when performing ADLs;Long Term: Be able to perform more ADLs without symptoms or delay the onset of symptoms    Diabetes  Yes pre diabetic    Intervention  Provide education about signs/symptoms and action to take for hypo/hyperglycemia.;Provide education about proper nutrition, including hydration, and aerobic/resistive exercise prescription along with prescribed medications to achieve blood glucose in normal ranges: Fasting glucose 65-99 mg/dL     Expected Outcomes  Short Term: Participant verbalizes understanding of the signs/symptoms and immediate care of hyper/hypoglycemia, proper foot care and importance of medication, aerobic/resistive exercise and nutrition plan for blood glucose control.;Long Term: Attainment of HbA1C < 7%.    Hypertension  Yes    Intervention  Provide education on lifestyle modifcations including regular physical activity/exercise, weight management, moderate sodium restriction and increased consumption of fresh fruit, vegetables, and low fat dairy, alcohol moderation, and smoking cessation.;Monitor prescription use  compliance.    Expected Outcomes  Short Term: Continued assessment and intervention until BP is < 140/39m HG in hypertensive participants. < 130/857mHG in hypertensive participants with diabetes, heart failure or chronic kidney disease.;Long Term: Maintenance of blood pressure at goal levels.    Lipids  Yes    Intervention  Provide education and support for participant on nutrition & aerobic/resistive exercise along with prescribed medications to achieve LDL <7037mHDL >28m75m  Expected Outcomes  Short Term: Participant states understanding of desired cholesterol values and is compliant with medications prescribed. Participant is following exercise prescription and nutrition guidelines.;Long Term: Cholesterol controlled with medications as prescribed, with individualized exercise RX and with personalized nutrition plan. Value goals: LDL < 70mg67mL > 40 mg.       Core Components/Risk Factors/Patient Goals Review:    Core Components/Risk Factors/Patient Goals at Discharge (Final Review):    ITP Comments: ITP Comments    Row Name 05/02/17 1043 05/02/17 1044 05/22/17 0906       ITP Comments  -  Medical Evaluation completed. Chart sent for review and changes to Dr. Mark Emily Filbertctor of [facility] Grantgnosis can be found in [facility] encounter 05/02/16  30 day review completed. ITP sent to Dr. Mark Emily Filbertector of [facility] with ITP unless changes are made by physician.        Comments: 30 day review

## 2017-05-22 NOTE — Progress Notes (Signed)
Daily Session Note  Patient Details  Name: Melvin Brewer MRN: [id number] Date of Birth: 10-29-1950 Referring Provider:     Pulmonary Rehab from 05/02/2017 in [facility] Cardiac and Pulmonary Rehab  Referring Provider  [facility]      Encounter Date: 05/22/2017  Check In: Session Check In - 05/22/17 1023      Check-In   Location  [facility]-Cardiac & Pulmonary Rehab    Staff Present  Nada Maclachlan, BA, ACSM CEP, Exercise Physiologist;Kelly Amedeo Plenty, BS, ACSM CEP, Exercise Physiologist;Mylena Sedberry Flavia Shipper    Supervising physician immediately available to respond to emergencies  [facility] immediately available ER MD    Physician(s)  Dr. Corky Downs and [place]    Medication changes reported      No    Fall or balance concerns reported     No    Warm-up and Cool-down  Performed as group-led instruction    Resistance Training Performed  Yes    VAD Patient?  No      Pain Assessment   Currently in Pain?  No/denies          Social History   Tobacco Use  Smoking Status Former Smoker  . Packs/day: 2.00  . Years: 43.00  . Pack years: 86.00  . Types: Cigarettes  . Last attempt to quit: 04/11/2004  . Years since quitting: 13.1  Smokeless Tobacco Never Used    Goals Met:  Independence with exercise equipment Exercise tolerated well No report of cardiac concerns or symptoms Strength training completed today  Goals Unmet:  Not Applicable  Comments: Pt able to follow exercise prescription today without complaint.  Will continue to monitor for progression.   Dr. Emily Filbert is Medical Director for [place] and [facility].

## 2017-05-24 DIAGNOSIS — J441 Chronic obstructive pulmonary disease with (acute) exacerbation: Secondary | ICD-10-CM | POA: Diagnosis not present

## 2017-05-24 DIAGNOSIS — J449 Chronic obstructive pulmonary disease, unspecified: Secondary | ICD-10-CM

## 2017-05-24 NOTE — Progress Notes (Signed)
Daily Session Note  Patient Details  Name: Melvin Brewer MRN: [id number] Date of Birth: [birth year] Referring Provider:     Pulmonary Rehab from 05/02/2017 in [facility] Cardiac and Pulmonary Rehab  Referring Provider  [facility]      Encounter Date: 05/24/2017  Check In: Session Check In - 05/24/17 0944      Check-In   Location  [facility]-Cardiac & Pulmonary Rehab    Staff Present  Justin Mend Lorre Nick, MA, RCEP, CCRP, Exercise Physiologist;Amanda Oletta Darter, [place], ACSM CEP, Exercise Physiologist    Supervising physician immediately available to respond to emergencies  [facility] immediately available ER MD    Physician(s)  Dr. Alfred Levins and Burlene Arnt    Medication changes reported      No    Fall or balance concerns reported     No    Warm-up and Cool-down  Performed as group-led instruction    Resistance Training Performed  Yes    VAD Patient?  No      Pain Assessment   Currently in Pain?  No/denies          Social History   Tobacco Use  Smoking Status Former Smoker  . Packs/day: 2.00  . Years: 43.00  . Pack years: 86.00  . Types: Cigarettes  . Last attempt to quit: 04/11/2004  . Years since quitting: 13.1  Smokeless Tobacco Never Used    Goals Met:  Independence with exercise equipment Exercise tolerated well No report of cardiac concerns or symptoms Strength training completed today  Goals Unmet:  Not Applicable  Comments: Reviewed home exercise with pt today.  Pt plans to go to the gym 1 extra day a week for exercise.  Reviewed THR, pulse, RPE, sign and symptoms, NTG use, and when to call 911 or MD.  Also discussed weather considerations and indoor options.  Pt voiced understanding.Pt able to follow exercise prescription today without complaint.  Will continue to monitor for progression.   Dr. Emily Filbert is Medical Director for [place] and [facility].

## 2017-05-26 ENCOUNTER — Encounter: Payer: Medicare Other | Admitting: *Deleted

## 2017-05-26 DIAGNOSIS — J449 Chronic obstructive pulmonary disease, unspecified: Secondary | ICD-10-CM

## 2017-05-26 DIAGNOSIS — J441 Chronic obstructive pulmonary disease with (acute) exacerbation: Secondary | ICD-10-CM | POA: Diagnosis not present

## 2017-05-26 NOTE — Progress Notes (Signed)
Daily Session Note  Patient Details  Name: Melvin Brewer MRN: [id number] Date of Birth: 09-Dec-1950 Referring Provider:     Pulmonary Rehab from 05/02/2017 in [facility] Cardiac and Pulmonary Rehab  Referring Provider  [facility]      Encounter Date: 05/26/2017  Check In: Session Check In - 05/26/17 1019      Check-In   Location  [facility]-Cardiac & Pulmonary Rehab    Staff Present  Renita Papa, RN Vickki Hearing, BA, ACSM CEP, Exercise Physiologist    Supervising physician immediately available to respond to emergencies  [facility] immediately available ER MD    Physician(s)  Dr. Jacqualine Code and Quentin Cornwall    Medication changes reported      No    Fall or balance concerns reported     No    Warm-up and Cool-down  Performed as group-led instruction    Resistance Training Performed  Yes    VAD Patient?  No      Pain Assessment   Currently in Pain?  No/denies          Social History   Tobacco Use  Smoking Status Former Smoker  . Packs/day: 2.00  . Years: 43.00  . Pack years: 86.00  . Types: Cigarettes  . Last attempt to quit: 04/11/2004  . Years since quitting: 13.1  Smokeless Tobacco Never Used    Goals Met:  Proper associated with RPD/PD & O2 Sat Independence with exercise equipment Using PLB without cueing & demonstrates good technique Exercise tolerated well Strength training completed today  Goals Unmet:  Not Applicable  Comments: Pt able to follow exercise prescription today without complaint.  Will continue to monitor for progression.    Dr. Emily Filbert is Medical Director for [place] and [facility].

## 2017-05-29 DIAGNOSIS — J441 Chronic obstructive pulmonary disease with (acute) exacerbation: Secondary | ICD-10-CM | POA: Diagnosis not present

## 2017-05-29 DIAGNOSIS — J449 Chronic obstructive pulmonary disease, unspecified: Secondary | ICD-10-CM

## 2017-05-29 NOTE — Progress Notes (Signed)
Daily Session Note  Patient Details  Name: Melvin Brewer MRN: [id number] Date of Birth: [birth year] Referring Provider:     Pulmonary Rehab from 05/02/2017 in [facility] Cardiac and Pulmonary Rehab  Referring Provider  [facility]      Encounter Date: 05/29/2017  Check In: Session Check In - 05/29/17 1015      Check-In   Location  [facility]-Cardiac & Pulmonary Rehab    Staff Present  Earlean Shawl, BS, ACSM CEP, Exercise Physiologist;Amanda Oletta Darter, BA, ACSM CEP, Exercise Physiologist;Clara Herbison Flavia Shipper    Supervising physician immediately available to respond to emergencies  [facility] immediately available ER MD    Physician(s)  Jimmye Norman and Alfred Levins    Medication changes reported      No    Fall or balance concerns reported     No    Tobacco Cessation  No Change    Warm-up and Cool-down  Performed as group-led instruction    Resistance Training Performed  Yes    VAD Patient?  No      Pain Assessment   Currently in Pain?  No/denies          Social History   Tobacco Use  Smoking Status Former Smoker  . Packs/day: 2.00  . Years: 43.00  . Pack years: 86.00  . Types: Cigarettes  . Last attempt to quit: 04/11/2004  . Years since quitting: 13.1  Smokeless Tobacco Never Used    Goals Met:  Independence with exercise equipment Exercise tolerated well No report of cardiac concerns or symptoms Strength training completed today  Goals Unmet:  Not Applicable  Comments: Pt able to follow exercise prescription today without complaint.  Will continue to monitor for progression.   Dr. Emily Filbert is Medical Director for [place] and [facility].

## 2017-06-02 DIAGNOSIS — J441 Chronic obstructive pulmonary disease with (acute) exacerbation: Secondary | ICD-10-CM | POA: Diagnosis not present

## 2017-06-02 DIAGNOSIS — J449 Chronic obstructive pulmonary disease, unspecified: Secondary | ICD-10-CM

## 2017-06-02 NOTE — Progress Notes (Signed)
Daily Session Note  Patient Details  Name: Melvin Brewer MRN: [id number] Date of Birth: Jan 24, 1951 Referring Provider:     Pulmonary Rehab from 05/02/2017 in [facility] Cardiac and Pulmonary Rehab  Referring Provider  [facility]      Encounter Date: 06/02/2017  Check In: Session Check In - 06/02/17 1027      Check-In   Location  [facility]-Cardiac & Pulmonary Rehab    Staff Present  Renita Papa, RN BSN;Mandi [place], BS, PEC;Nevia Henkin [employer]    Supervising physician immediately available to respond to emergencies  [facility] immediately available ER MD    Physician(s)  Dr. Jacqualine Code and [facility]    Medication changes reported      No    Fall or balance concerns reported     No    Warm-up and Cool-down  Performed as group-led instruction    Resistance Training Performed  Yes    VAD Patient?  No      Pain Assessment   Currently in Pain?  No/denies          Social History   Tobacco Use  Smoking Status Former Smoker  . Packs/day: 2.00  . Years: 43.00  . Pack years: 86.00  . Types: Cigarettes  . Last attempt to quit: 04/11/2004  . Years since quitting: 13.1  Smokeless Tobacco Never Used    Goals Met:  Independence with exercise equipment Exercise tolerated well No report of cardiac concerns or symptoms Strength training completed today  Goals Unmet:  Not Applicable  Comments: Pt able to follow exercise prescription today without complaint.  Will continue to monitor for progression.   Dr. Emily Filbert is Medical Director for [place] and [facility].

## 2017-06-05 DIAGNOSIS — J441 Chronic obstructive pulmonary disease with (acute) exacerbation: Secondary | ICD-10-CM | POA: Diagnosis not present

## 2017-06-05 DIAGNOSIS — J449 Chronic obstructive pulmonary disease, unspecified: Secondary | ICD-10-CM

## 2017-06-05 NOTE — Progress Notes (Signed)
Daily Session Note  Patient Details  Name: Melvin Brewer MRN: [id number] Date of Birth: 02-16-1951 Referring Provider:     Pulmonary Rehab from 05/02/2017 in [facility] Cardiac and Pulmonary Rehab  Referring Provider  [facility]      Encounter Date: 06/05/2017  Check In: Session Check In - 06/05/17 1112      Check-In   Location  [facility]-Cardiac & Pulmonary Rehab    Staff Present  Nada Maclachlan, BA, ACSM CEP, Exercise Physiologist;Kelly Amedeo Plenty, BS, ACSM CEP, Exercise Physiologist;Nabiha Planck Flavia Shipper    Supervising physician immediately available to respond to emergencies  [facility] immediately available ER MD    Physician(s)  Dr. Reita Cliche and Mariea Clonts    Medication changes reported      No    Fall or balance concerns reported     No    Warm-up and Cool-down  Performed as group-led instruction    Resistance Training Performed  Yes    VAD Patient?  No      Pain Assessment   Currently in Pain?  No/denies          Social History   Tobacco Use  Smoking Status Former Smoker  . Packs/day: 2.00  . Years: 43.00  . Pack years: 86.00  . Types: Cigarettes  . Last attempt to quit: 04/11/2004  . Years since quitting: 13.1  Smokeless Tobacco Never Used    Goals Met:  Independence with exercise equipment Exercise tolerated well No report of cardiac concerns or symptoms Strength training completed today  Goals Unmet:  Not Applicable  Comments:Pt able to follow exercise prescription today without complaint.  Will continue to monitor for progression.   Dr. Emily Filbert is Medical Director for [place] and [facility].

## 2017-06-07 DIAGNOSIS — J441 Chronic obstructive pulmonary disease with (acute) exacerbation: Secondary | ICD-10-CM | POA: Diagnosis not present

## 2017-06-07 DIAGNOSIS — J449 Chronic obstructive pulmonary disease, unspecified: Secondary | ICD-10-CM

## 2017-06-07 NOTE — Progress Notes (Signed)
Daily Session Note  Patient Details  Name: Melvin Brewer MRN: [id number] Date of Birth: 17-Sep-1950 Referring Provider:     Pulmonary Rehab from 05/02/2017 in [facility] Cardiac and Pulmonary Rehab  Referring Provider  [facility]      Encounter Date: 06/07/2017  Check In: Session Check In - 06/07/17 1018      Check-In   Location  [facility]-Cardiac & Pulmonary Rehab    Staff Present  Justin Mend RCP,RRT,BSRT;Amanda Oletta Darter, BA, ACSM CEP, Exercise Physiologist;Jessica Luan [place], [place], RCEP, CCRP, Exercise Physiologist    Supervising physician immediately available to respond to emergencies  [facility] immediately available ER MD    Physician(s)  Dr. Reita Cliche and Jimmye Norman    Medication changes reported      No    Fall or balance concerns reported     No    Tobacco Cessation  No Change    Warm-up and Cool-down  Performed as group-led instruction    Resistance Training Performed  Yes    VAD Patient?  No      Pain Assessment   Currently in Pain?  No/denies          Social History   Tobacco Use  Smoking Status Former Smoker  . Packs/day: 2.00  . Years: 43.00  . Pack years: 86.00  . Types: Cigarettes  . Last attempt to quit: 04/11/2004  . Years since quitting: 13.1  Smokeless Tobacco Never Used    Goals Met:  Independence with exercise equipment Exercise tolerated well Personal goals reviewed No report of cardiac concerns or symptoms Strength training completed today  Goals Unmet:  Not Applicable  Comments: Pt able to follow exercise prescription today without complaint.  Will continue to monitor for progression.   Dr. Emily Filbert is Medical Director for [place] and [facility].

## 2017-06-09 ENCOUNTER — Encounter: Payer: Medicare Other | Attending: Family Medicine | Admitting: *Deleted

## 2017-06-09 DIAGNOSIS — E119 Type 2 diabetes mellitus without complications: Secondary | ICD-10-CM | POA: Diagnosis not present

## 2017-06-09 DIAGNOSIS — J441 Chronic obstructive pulmonary disease with (acute) exacerbation: Secondary | ICD-10-CM | POA: Insufficient documentation

## 2017-06-09 DIAGNOSIS — J449 Chronic obstructive pulmonary disease, unspecified: Secondary | ICD-10-CM

## 2017-06-09 NOTE — Progress Notes (Signed)
Daily Session Note  Patient Details  Name: Melvin Brewer MRN: [id number] Date of Birth: [birth year] Referring Provider:     Pulmonary Rehab from 05/02/2017 in [facility] Cardiac and Pulmonary Rehab  Referring Provider  [facility]      Encounter Date: 06/09/2017  Check In: Session Check In - 06/09/17 1017      Check-In   Location  [facility]-Cardiac & Pulmonary Rehab    Staff Present  Renita Papa, RN Vickki Hearing, BA, ACSM CEP, Exercise Physiologist;Krista Frederico Hamman, RN BSN    Supervising physician immediately available to respond to emergencies  [facility] immediately available ER MD    Physician(s)  Dr. Kerman Passey and Clearnce Hasten    Medication changes reported      No    Fall or balance concerns reported     No    Tobacco Cessation  No Change    Warm-up and Cool-down  Performed as group-led instruction    Resistance Training Performed  Yes    VAD Patient?  No      Pain Assessment   Currently in Pain?  No/denies          Social History   Tobacco Use  Smoking Status Former Smoker  . Packs/day: 2.00  . Years: 43.00  . Pack years: 86.00  . Types: Cigarettes  . Last attempt to quit: 04/11/2004  . Years since quitting: 13.1  Smokeless Tobacco Never Used    Goals Met:  Proper associated with RPD/PD & O2 Sat Independence with exercise equipment Using PLB without cueing & demonstrates good technique Exercise tolerated well Strength training completed today  Goals Unmet:  Not Applicable  Comments: Pt able to follow exercise prescription today without complaint.  Will continue to monitor for progression.    Dr. Emily Filbert is Medical Director for [place] and [facility].

## 2017-06-12 ENCOUNTER — Encounter: Payer: Medicare Other | Admitting: Dietician

## 2017-06-12 ENCOUNTER — Encounter: Payer: Self-pay | Admitting: Dietician

## 2017-06-12 ENCOUNTER — Encounter: Payer: Medicare Other | Admitting: *Deleted

## 2017-06-12 VITALS — Ht 69.0 in | Wt 214.0 lb

## 2017-06-12 DIAGNOSIS — J449 Chronic obstructive pulmonary disease, unspecified: Secondary | ICD-10-CM

## 2017-06-12 DIAGNOSIS — J441 Chronic obstructive pulmonary disease with (acute) exacerbation: Secondary | ICD-10-CM | POA: Diagnosis not present

## 2017-06-12 DIAGNOSIS — E119 Type 2 diabetes mellitus without complications: Secondary | ICD-10-CM

## 2017-06-12 NOTE — Progress Notes (Signed)
Daily Session Note  Patient Details  Name: Melvin Brewer MRN: [id number] Date of Birth: 1951/03/10 Referring Provider:     Pulmonary Rehab from 05/02/2017 in [facility] Cardiac and Pulmonary Rehab  Referring Provider  [place]      Encounter Date: 06/12/2017  Check In: Session Check In - 06/12/17 1025      Check-In   Location  [facility]-Cardiac & Pulmonary Rehab    Staff Present  Earlean Shawl, BS, ACSM CEP, Exercise Physiologist;Amanda Oletta Darter, BA, ACSM CEP, Exercise Physiologist;Joseph Flavia Shipper    Supervising physician immediately available to respond to emergencies  [facility] immediately available ER MD    Physician(s)  Burlene Arnt and Jimmye Norman    Medication changes reported      No    Fall or balance concerns reported     No    Warm-up and Cool-down  Performed as group-led instruction    Resistance Training Performed  Yes    VAD Patient?  No      Pain Assessment   Currently in Pain?  No/denies    Multiple Pain Sites  No          Social History   Tobacco Use  Smoking Status Former Smoker  . Packs/day: 2.00  . Years: 43.00  . Pack years: 86.00  . Types: Cigarettes  . Last attempt to quit: 04/11/2004  . Years since quitting: 13.1  Smokeless Tobacco Never Used    Goals Met:  Proper associated with RPD/PD & O2 Sat Independence with exercise equipment Exercise tolerated well No report of cardiac concerns or symptoms Strength training completed today  Goals Unmet:  Not Applicable  Comments: Pt able to follow exercise prescription today without complaint.  Will continue to monitor for progression.    Dr. Emily Filbert is Medical Director for [place] and [facility].

## 2017-06-12 NOTE — Progress Notes (Signed)

## 2017-06-14 DIAGNOSIS — J441 Chronic obstructive pulmonary disease with (acute) exacerbation: Secondary | ICD-10-CM | POA: Diagnosis not present

## 2017-06-14 DIAGNOSIS — J449 Chronic obstructive pulmonary disease, unspecified: Secondary | ICD-10-CM

## 2017-06-14 NOTE — Progress Notes (Signed)
Daily Session Note  Patient Details  Name: ABDULLOH ULLOM MRN: [id number] Date of Birth: 01-Jan-1951 Referring Provider:     Pulmonary Rehab from 05/02/2017 in [facility] Cardiac and Pulmonary Rehab  Referring Provider  [facility]      Encounter Date: 06/14/2017  Check In: Session Check In - 06/14/17 1009      Check-In   Location  [facility]-Cardiac & Pulmonary Rehab    Staff Present  Justin Mend RCP,RRT,BSRT;Amanda Oletta Darter, BA, ACSM CEP, Exercise Physiologist;Jessica Luan [place], [place], RCEP, CCRP, Exercise Physiologist    Supervising physician immediately available to respond to emergencies  [facility] immediately available ER MD    Physician(s)  Dr. Joni Fears and Jimmye Norman    Medication changes reported      No    Fall or balance concerns reported     No    Tobacco Cessation  No Change    Warm-up and Cool-down  Performed as group-led instruction    Resistance Training Performed  Yes    VAD Patient?  No      Pain Assessment   Currently in Pain?  No/denies          Social History   Tobacco Use  Smoking Status Former Smoker  . Packs/day: 2.00  . Years: 43.00  . Pack years: 86.00  . Types: Cigarettes  . Last attempt to quit: 04/11/2004  . Years since quitting: 13.1  Smokeless Tobacco Never Used    Goals Met:  Independence with exercise equipment Exercise tolerated well No report of cardiac concerns or symptoms Strength training completed today  Goals Unmet:  Not Applicable  Comments: Pt able to follow exercise prescription today without complaint.  Will continue to monitor for progression.   Dr. Emily Filbert is Medical Director for [place] and [facility].

## 2017-06-16 ENCOUNTER — Encounter: Payer: Medicare Other | Admitting: *Deleted

## 2017-06-16 DIAGNOSIS — J441 Chronic obstructive pulmonary disease with (acute) exacerbation: Secondary | ICD-10-CM | POA: Diagnosis not present

## 2017-06-16 DIAGNOSIS — J449 Chronic obstructive pulmonary disease, unspecified: Secondary | ICD-10-CM

## 2017-06-16 NOTE — Progress Notes (Signed)
Daily Session Note  Patient Details  Name: Melvin Brewer MRN: [id number] Date of Birth: January 13, 1951 Referring Provider:     Pulmonary Rehab from 05/02/2017 in [facility] Cardiac and Pulmonary Rehab  Referring Provider  [place]      Encounter Date: 06/16/2017  Check In: Session Check In - 06/16/17 1035      Check-In   Location  [facility]-Cardiac & Pulmonary Rehab    Staff Present  Renita Papa, RN Vickki Hearing, BA, ACSM CEP, Exercise Physiologist;Mary Kellie Shropshire, RN, BSN, MA    Supervising physician immediately available to respond to emergencies  [facility] immediately available ER MD    Physician(s)   Dr. Reita Cliche and Archie Balboa    Medication changes reported      No    Fall or balance concerns reported     No    Warm-up and Cool-down  Performed on first and last piece of equipment    Resistance Training Performed  Yes    VAD Patient?  No      Pain Assessment   Currently in Pain?  No/denies          Social History   Tobacco Use  Smoking Status Former Smoker  . Packs/day: 2.00  . Years: 43.00  . Pack years: 86.00  . Types: Cigarettes  . Last attempt to quit: 04/11/2004  . Years since quitting: 13.1  Smokeless Tobacco Never Used    Goals Met:  Proper associated with RPD/PD & O2 Sat Independence with exercise equipment Using PLB without cueing & demonstrates good technique Exercise tolerated well Strength training completed today  Goals Unmet:  Not Applicable  Comments: Pt able to follow exercise prescription today without complaint.  Will continue to monitor for progression.    Dr. Emily Filbert is Medical Director for [place] and [facility].

## 2017-06-19 ENCOUNTER — Encounter: Payer: Medicare Other | Admitting: Dietician

## 2017-06-19 ENCOUNTER — Encounter: Payer: Self-pay | Admitting: Dietician

## 2017-06-19 ENCOUNTER — Telehealth: Payer: Self-pay

## 2017-06-19 VITALS — Wt 214.2 lb

## 2017-06-19 DIAGNOSIS — J449 Chronic obstructive pulmonary disease, unspecified: Secondary | ICD-10-CM

## 2017-06-19 DIAGNOSIS — E119 Type 2 diabetes mellitus without complications: Secondary | ICD-10-CM

## 2017-06-19 DIAGNOSIS — J441 Chronic obstructive pulmonary disease with (acute) exacerbation: Secondary | ICD-10-CM | POA: Diagnosis not present

## 2017-06-19 NOTE — Progress Notes (Signed)

## 2017-06-19 NOTE — Progress Notes (Signed)
Pulmonary Individual Treatment Plan  Patient Details  Name: CRESPIN FORSTROM MRN: [id number] Date of Birth: Feb 23, 1951 Referring Provider:     Pulmonary Rehab from 05/02/2017 in [facility] Cardiac and Pulmonary Rehab  Referring Provider  Grandis      Initial Encounter Date:    Pulmonary Rehab from 05/02/2017 in [facility] Cardiac and Pulmonary Rehab  Date  05/02/17  Referring Provider  Grandis      Visit Diagnosis: Chronic obstructive pulmonary disease, unspecified COPD type ([place])  Patient's Home Medications on Admission:  Current Outpatient Medications:  .  albuterol (PROVENTIL HFA;VENTOLIN HFA) 108 (90 BASE) MCG/ACT inhaler, Inhale 2 puffs into the lungs every 6 (six) hours as needed for wheezing or shortness of breath., Disp: , Rfl:  .  amLODipine (NORVASC) 2.5 MG tablet, Take 1 tablet by mouth daily., Disp: , Rfl:  .  aspirin EC 81 MG tablet, Take 1 tablet by mouth daily., Disp: , Rfl:  .  atorvastatin (LIPITOR) 40 MG tablet, Take 1 tablet by mouth daily., Disp: , Rfl:  .  budesonide-formoterol (SYMBICORT) 160-4.5 MCG/ACT inhaler, Place 2 sprays into the nose 2 (two) times daily., Disp: , Rfl:  .  Cetirizine HCl (ZYRTEC ALLERGY) 10 MG CAPS, Take 1 capsule by mouth daily., Disp: , Rfl:  .  Cholecalciferol (VITAMIN D-1000 MAX ST) 1000 units tablet, Take 1 tablet by mouth daily., Disp: , Rfl:  .  DENTA 5000 PLUS 1.1 % CREA dental cream, Take 1 application by mouth 3 (three) times daily. Brush teeth for at least 1 min 2-3 times a day, Disp: , Rfl: 5 .  docusate sodium 100 MG CAPS, Take 100 mg by mouth 2 (two) times daily. (Patient not taking: Reported on 05/15/2017), Disp: 60 capsule, Rfl: 0 .  fluticasone (FLONASE) 50 MCG/ACT nasal spray, Place 1 spray into both nostrils daily., Disp: , Rfl:  .  metFORMIN (GLUCOPHAGE) 500 MG tablet, Take 1 tablet by mouth daily., Disp: , Rfl:  .  metoprolol (LOPRESSOR) 50 MG tablet, Take 50 mg by mouth 2 (two) times daily., Disp: , Rfl:  .  theophylline  (UNIPHYL) 400 MG 24 hr tablet, Take 1 tablet by mouth daily., Disp: , Rfl:  .  Tiotropium Bromide Monohydrate (SPIRIVA RESPIMAT) 2.5 MCG/ACT AERS, Inhale 2 Inhalers into the lungs daily., Disp: , Rfl:   Past Medical History: Past Medical History:  Diagnosis Date  . Abdominal aortic aneurysm (AAA) ([place])   . COPD (chronic obstructive pulmonary disease) ([place])   . Coronary artery disease   . H/O hiatal hernia    AGE 67 S  NO MEDS  . Hyperlipidemia   . Hypertension   . Shortness of breath    W/ EXERTION     Tobacco Use: Social History   Tobacco Use  Smoking Status Former Smoker  . Packs/day: 2.00  . Years: 43.00  . Pack years: 86.00  . Types: Cigarettes  . Last attempt to quit: 04/11/2004  . Years since quitting: 13.1  Smokeless Tobacco Never Used    Labs: Recent Review Flowsheet Data    There is no flowsheet data to display.       Pulmonary Assessment Scores: Pulmonary Assessment Scores    Row Name 05/02/17 1106 06/12/17 1126       ADL UCSD   ADL Phase  Entry  Mid    SOB Score total  44  3    Rest  0  0    Walk  0  1    Stairs  3  3    Bath  2  0    Dress  2  1    Shop  2  1      CAT Score   CAT Score  20  -      mMRC Score   mMRC Score  1  -       Pulmonary Function Assessment: Pulmonary Function Assessment - 05/02/17 1112      Initial Spirometry Results   FVC%  31 %    FEV1%  34 %    FEV1/FVC Ratio  74.02    Comments  Good patient effort      Post Bronchodilator Spirometry Results   FVC%  36.58 %    FEV1%  36.72 %    FEV1/FVC Ratio  73.38    Comments  Good patient effort      Breath   Bilateral Breath Sounds  Clear;Decreased    Shortness of Breath  Yes;Limiting activity       Exercise Target Goals:    Exercise Program Goal: Individual exercise prescription set using results from initial 6 min walk test and THRR while considering  patient's activity barriers and safety.    Exercise Prescription Goal: Initial exercise prescription  builds to 30-45 minutes a day of aerobic activity, 2-3 days per week.  Home exercise guidelines will be given to patient during program as part of exercise prescription that the participant will acknowledge.  Activity Barriers & Risk Stratification:   6 Minute Walk: 6 Minute Walk    Row Name 05/02/17 1217         6 Minute Walk   Distance  1520 feet     Walk Time  6 minutes     # of Rest Breaks  0     MPH  2.87     METS  3.3     RPE  15     Perceived Dyspnea   2     VO2 Peak  11.6     Symptoms  No     Resting HR  72 bpm     Resting BP  128/64     Resting Oxygen Saturation   97 %     Exercise Oxygen Saturation  during 6 min walk  90 %     Max Ex. HR  110 bpm     Max Ex. BP  134/78     2 Minute Post BP  128/72       Interval HR   1 Minute HR  101     2 Minute HR  106     3 Minute HR  103     5 Minute HR  110     6 Minute HR  100     Interval Heart Rate?  Yes       Interval Oxygen   Interval Oxygen?  Yes     Baseline Oxygen Saturation %  97 %     1 Minute Oxygen Saturation %  91 %     1 Minute Liters of Oxygen  0 L     2 Minute Oxygen Saturation %  90 %     2 Minute Liters of Oxygen  0 L     3 Minute Oxygen Saturation %  94 %     3 Minute Liters of Oxygen  0 L     4 Minute Liters of Oxygen  0 L     5 Minute Oxygen Saturation %  93 %     5 Minute Liters of Oxygen  0 L     6 Minute Oxygen Saturation %  94 %     6 Minute Liters of Oxygen  0 L     2 Minute Post Oxygen Saturation %  97 %     2 Minute Post Liters of Oxygen  0 L       Oxygen Initial Assessment: Oxygen Initial Assessment - 05/02/17 1110      Home Oxygen   Home Oxygen Device  None    Sleep Oxygen Prescription  None    Home Exercise Oxygen Prescription  None    Home at Rest Exercise Oxygen Prescription  None      Initial 6 min Walk   Oxygen Used  None      Program Oxygen Prescription   Program Oxygen Prescription  None      Intervention   Short Term Goals  To learn and understand importance of  maintaining oxygen saturations>88%;To learn and demonstrate proper use of respiratory medications;To learn and demonstrate proper pursed lip breathing techniques or other breathing techniques.;To learn and understand importance of monitoring SPO2 with pulse oximeter and demonstrate accurate use of the pulse oximeter.    Long  Term Goals  Verbalizes importance of monitoring SPO2 with pulse oximeter and return demonstration;Maintenance of O2 saturations>88%;Compliance with respiratory medication;Demonstrates proper use of MDI's;Exhibits proper breathing techniques, such as pursed lip breathing or other method taught during program session       Oxygen Re-Evaluation: Oxygen Re-Evaluation    Row Name 05/05/17 0950 06/07/17 1036           Program Oxygen Prescription   Program Oxygen Prescription  -  None        Home Oxygen   Home Oxygen Device  -  None      Sleep Oxygen Prescription  -  None      Home Exercise Oxygen Prescription  -  None      Home at Rest Exercise Oxygen Prescription  -  None        Goals/Expected Outcomes   Short Term Goals  To learn and understand importance of maintaining oxygen saturations>88%;To learn and demonstrate proper use of respiratory medications;To learn and demonstrate proper pursed lip breathing techniques or other breathing techniques.;To learn and understand importance of monitoring SPO2 with pulse oximeter and demonstrate accurate use of the pulse oximeter.  To learn and understand importance of maintaining oxygen saturations>88%;To learn and demonstrate proper use of respiratory medications;To learn and demonstrate proper pursed lip breathing techniques or other breathing techniques.;To learn and understand importance of monitoring SPO2 with pulse oximeter and demonstrate accurate use of the pulse oximeter.      Long  Term Goals  Verbalizes importance of monitoring SPO2 with pulse oximeter and return demonstration;Maintenance of O2 saturations>88%;Compliance  with respiratory medication;Demonstrates proper use of MDI's;Exhibits proper breathing techniques, such as pursed lip breathing or other method taught during program session  Verbalizes importance of monitoring SPO2 with pulse oximeter and return demonstration;Maintenance of O2 saturations>88%;Compliance with respiratory medication;Demonstrates proper use of MDI's;Exhibits proper breathing techniques, such as pursed lip breathing or other method taught during program session      Comments  Reviewed PLB technique with pt.  Talked about how it work and it's important to maintaining his exercise saturations.    Jibril checks his oxygen daily at home. He knows to keep his oxygen above 88 percent. Nebulizer is used as needed. He uses  his spacer with his inhalers at home.      Goals/Expected Outcomes  Short: Become more profiecient at using PLB.   Long: Become independent at using PLB.  Short: be compliant with home medications. Long: maintain medications and checking his oxygen at home.         Oxygen Discharge (Final Oxygen Re-Evaluation): Oxygen Re-Evaluation - 06/07/17 1036      Program Oxygen Prescription   Program Oxygen Prescription  None      Home Oxygen   Home Oxygen Device  None    Sleep Oxygen Prescription  None    Home Exercise Oxygen Prescription  None    Home at Rest Exercise Oxygen Prescription  None      Goals/Expected Outcomes   Short Term Goals  To learn and understand importance of maintaining oxygen saturations>88%;To learn and demonstrate proper use of respiratory medications;To learn and demonstrate proper pursed lip breathing techniques or other breathing techniques.;To learn and understand importance of monitoring SPO2 with pulse oximeter and demonstrate accurate use of the pulse oximeter.    Long  Term Goals  Verbalizes importance of monitoring SPO2 with pulse oximeter and return demonstration;Maintenance of O2 saturations>88%;Compliance with respiratory  medication;Demonstrates proper use of MDI's;Exhibits proper breathing techniques, such as pursed lip breathing or other method taught during program session    Comments  Alastair checks his oxygen daily at home. He knows to keep his oxygen above 88 percent. Nebulizer is used as needed. He uses his spacer with his inhalers at home.    Goals/Expected Outcomes  Short: be compliant with home medications. Long: maintain medications and checking his oxygen at home.       Initial Exercise Prescription: Initial Exercise Prescription - 05/02/17 1200      Date of Initial Exercise RX and Referring Provider   Date  05/02/17    Referring Provider  Grandis      Treadmill   MPH  2.5    Grade  1    Minutes  15      Recumbant Bike   Level  4    RPM  60    Watts  40    Minutes  15    METs  3.3      Elliptical   Level  1    Speed  2.5    Minutes  15      REL-XR   Level  3    Speed  50    Minutes  15    METs  3.3      Prescription Details   Frequency (times per week)  3    Duration  Progress to 45 minutes of aerobic exercise without signs/symptoms of physical distress      Intensity   THRR 40-80% of Max Heartrate  104-136    Ratings of Perceived Exertion  11-13    Perceived Dyspnea  0-4      Resistance Training   Training Prescription  Yes    Weight  4 lb    Reps  10-15       Perform Capillary Blood Glucose checks as needed.  Exercise Prescription Changes: Exercise Prescription Changes    Row Name 05/11/17 1000 05/24/17 1100 05/24/17 1400 06/07/17 1500       Response to Exercise   Blood Pressure (Admit)  134/60  -  120/68  114/60    Blood Pressure (Exercise)  126/70  -  -  -    Blood Pressure (Exit)  134/70  -  128/70  104/62    Heart Rate (Admit)  65 bpm  -  65 bpm  69 bpm    Heart Rate (Exercise)  95 bpm  -  96 bpm  96 bpm    Heart Rate (Exit)  76 bpm  -  70 bpm  73 bpm    Oxygen Saturation (Admit)  96 %  -  93 %  95 %    Oxygen Saturation (Exercise)  92 %  -  93 %  93  %    Oxygen Saturation (Exit)  94 %  -  94 %  95 %    Rating of Perceived Exertion (Exercise)  13  -  15  13    Perceived Dyspnea (Exercise)  2  -  2.5  2    Symptoms  none  -  none  none    Duration  Progress to 45 minutes of aerobic exercise without signs/symptoms of physical distress  -  Progress to 45 minutes of aerobic exercise without signs/symptoms of physical distress  Progress to 45 minutes of aerobic exercise without signs/symptoms of physical distress    Intensity  THRR unchanged  -  THRR unchanged  THRR unchanged      Progression   Progression  Continue to progress workloads to maintain intensity without signs/symptoms of physical distress.  -  Continue to progress workloads to maintain intensity without signs/symptoms of physical distress.  Continue to progress workloads to maintain intensity without signs/symptoms of physical distress.    Average METs  2.9  -  2.8  3      Resistance Training   Training Prescription  Yes  -  Yes  Yes    Weight  4 lb  -  4 lb  4 lb    Reps  10-15  -  10-15  10-15      Interval Training   Interval Training  No  -  No  No      Treadmill   MPH  -  -  -  2.5    Grade  -  -  -  1    Minutes  -  -  -  15      REL-XR   Level  -  -  3  4    Speed  -  -  50  50    Minutes  -  -  15  15    METs  -  -  2.8  2.8      Home Exercise Plan   Plans to continue exercise at  -  [employer] (comment) [employer] (comment) [employer] (comment[employer]    Frequency  -  Add 1 additional day to program exercise sessions.  Add 1 additional day to program exercise sessions.  Add 1 additional day to program exercise sessions.    Initial Home Exercises Provided  -  05/24/17  -  -       Exercise Comments: Exercise Comments    Row Name 05/05/17 0949 06/06/17 1659         Exercise Comments  First full day of exercise!  Patient was oriented to gym and equipment including functions, settings, policies, and procedures.  Patient's  individual exercise prescription and treatment plan were reviewed.  All starting workloads were established based on the results of the 6 minute walk test done at initial orientation visit.  The plan for exercise progression was also introduced  and progression will be customized based on patient's performance and goals.  Quention is switching from the eliptical to the arm crank. He states that the eliptical is bothering his knees and he gets too short of breath.          Exercise Goals and Review: Exercise Goals    Row Name 05/02/17 1216             Exercise Goals   Increase Physical Activity  Yes       Intervention  Provide advice, education, support and counseling about physical activity/exercise needs.;Develop an individualized exercise prescription for aerobic and resistive training based on initial evaluation findings, risk stratification, comorbidities and participant's personal goals.       Expected Outcomes  Short Term: Attend rehab on a regular basis to increase amount of physical activity.;Long Term: Add in home exercise to make exercise part of routine and to increase amount of physical activity.;Long Term: Exercising regularly at least 3-5 days a week.       Increase Strength and Stamina  Yes       Intervention  Provide advice, education, support and counseling about physical activity/exercise needs.;Develop an individualized exercise prescription for aerobic and resistive training based on initial evaluation findings, risk stratification, comorbidities and participant's personal goals.       Expected Outcomes  Short Term: Increase workloads from initial exercise prescription for resistance, speed, and METs.;Short Term: Perform resistance training exercises routinely during rehab and add in resistance training at home;Long Term: Improve cardiorespiratory fitness, muscular endurance and strength as measured by increased METs and functional capacity (6MWT)       Able to understand and use  rate of perceived exertion (RPE) scale  Yes       Intervention  Provide education and explanation on how to use RPE scale       Expected Outcomes  Short Term: Able to use RPE daily in rehab to express subjective intensity level;Long Term:  Able to use RPE to guide intensity level when exercising independently       Able to understand and use Dyspnea scale  Yes       Intervention  Provide education and explanation on how to use Dyspnea scale       Expected Outcomes  Short Term: Able to use Dyspnea scale daily in rehab to express subjective sense of shortness of breath during exertion;Long Term: Able to use Dyspnea scale to guide intensity level when exercising independently       Knowledge and understanding of Target Heart Rate Range (THRR)  Yes       Intervention  Provide education and explanation of THRR including how the numbers were predicted and where they are located for reference       Expected Outcomes  Short Term: Able to state/look up THRR;Long Term: Able to use THRR to govern intensity when exercising independently;Short Term: Able to use daily as guideline for intensity in rehab       Able to check pulse independently  Yes       Intervention  Provide education and demonstration on how to check pulse in carotid and radial arteries.;Review the importance of being able to check your own pulse for safety during independent exercise       Expected Outcomes  Short Term: Able to explain why pulse checking is important during independent exercise;Long Term: Able to check pulse independently and accurately       Understanding of Exercise Prescription  Yes  Intervention  Provide education, explanation, and written materials on patient's individual exercise prescription       Expected Outcomes  Short Term: Able to explain program exercise prescription;Long Term: Able to explain home exercise prescription to exercise independently          Exercise Goals Re-Evaluation : Exercise Goals  Re-Evaluation    Row Name 05/05/17 0949 05/11/17 1028 05/24/17 1136 05/24/17 1439 06/07/17 1535     Exercise Goal Re-Evaluation   Exercise Goals Review  Understanding of Exercise Prescription;Able to understand and use Dyspnea scale;Knowledge and understanding of Target Heart Rate Range (THRR);Able to understand and use rate of perceived exertion (RPE) scale  Increase Physical Activity;Increase Strength and Stamina;Able to understand and use rate of perceived exertion (RPE) scale;Able to understand and use Dyspnea scale  Increase Physical Activity;Increase Strength and Stamina  Increase Physical Activity;Able to understand and use rate of perceived exertion (RPE) scale;Increase Strength and Stamina;Able to understand and use Dyspnea scale;Knowledge and understanding of Target Heart Rate Range (THRR)  Increase Physical Activity;Able to understand and use rate of perceived exertion (RPE) scale;Able to understand and use Dyspnea scale;Increase Strength and Stamina   Comments  Reviewed RPE scale, THR and program prescription with pt today.  Pt voiced understanding and was given a copy of goals to take home  Clayburn is tolerating exercise well in his first week.  He is motivated to work hard in class.  Reviewed home exercise with pt today.  Pt plans to go to the gym 1 extra day a week for exercise.  Reviewed THR, pulse, RPE, sign and symptoms, NTG use, and when to call 911 or MD.  Also discussed weather considerations and indoor options.  Pt voiced understanding.  Reice has improved to doing 12 min continuous on Elliptical after only doing 5 to start.  Staff will continue to monitor.  Kelin is tolerating exercise well.  He is building up time on the elliptical to reach teh full 15 minutes.  He has substituted the arm crank a couple times due to knee pain   Expected Outcomes  Short: Use RPE daily to regulate intensity.  Long: Follow program prescription in THR.  Short - Te[place]um will continue to attend regularly   Long - Jacson will improve overall fitness   Short: add 1 extra day of exercise. Long: maintain and add more days to home exercise.  Short - [place]ell will reach 15 min on elliptical Long - Diaz will improve overall fitness  Short - Travonta will continue to attend Long - Karson will maintain exercise on his own      Discharge Exercise Prescription (Final Exercise Prescription Changes): Exercise Prescription Changes - 06/07/17 1500      Response to Exercise   Blood Pressure (Admit)  114/60    Blood Pressure (Exit)  104/62    Heart Rate (Admit)  69 bpm    Heart Rate (Exercise)  96 bpm    Heart Rate (Exit)  73 bpm    Oxygen Saturation (Admit)  95 %    Oxygen Saturation (Exercise)  93 %    Oxygen Saturation (Exit)  95 %    Rating of Perceived Exertion (Exercise)  13    Perceived Dyspnea (Exercise)  2    Symptoms  none    Duration  Progress to 45 minutes of aerobic exercise without signs/symptoms of physical distress    Intensity  THRR unchanged      Progression   Progression  Continue to progress workloads to  maintain intensity without signs/symptoms of physical distress.    Average METs  3      Resistance Training   Training Prescription  Yes    Weight  4 lb    Reps  10-15      Interval Training   Interval Training  No      Treadmill   MPH  2.5    Grade  1    Minutes  15      REL-XR   Level  4    Speed  50    Minutes  15    METs  2.8      Home Exercise Plan   Plans to continue exercise at  [employer] (comment) Gym    Frequency  Add 1 additional day to program exercise sessions.       Nutrition:  Target Goals: Understanding of nutrition guidelines, daily intake of sodium <1567m, cholesterol <2041m calories 30% from fat and 7% or less from saturated fats, daily to have 5 or more servings of fruits and vegetables.  Biometrics: Pre Biometrics - 05/02/17 1216      Pre Biometrics   Height  5' 10"  (1.778 m)    Weight  219 lb 9.6 oz (99.6 kg)    Waist  Circumference  43 inches    Hip Circumference  43 inches    Waist to Hip Ratio  1 %    BMI (Calculated)  31.51        Nutrition Therapy Plan and Nutrition Goals: Nutrition Therapy & Goals - 05/02/17 1105      Personal Nutrition Goals   Comments  He would like to lose weight and eat healthier. He would also like to meet with the dietician.      Intervention Plan   Intervention  Prescribe, educate and counsel regarding individualized specific dietary modifications aiming towards targeted core components such as weight, hypertension, lipid management, diabetes, heart failure and other comorbidities.;Nutrition handout(s) given to patient.    Expected Outcomes  Long Term Goal: Adherence to prescribed nutrition plan.;Short Term Goal: Understand basic principles of dietary content, such as calories, fat, sodium, cholesterol and nutrients.       Nutrition Assessments: Nutrition Assessments - 05/02/17 1126      MEDFICTS Scores   Pre Score  22       Nutrition Goals Re-Evaluation: Nutrition Goals Re-Evaluation    [place] 06/07/17 1045             Goals   Current Weight  215 lb 9.6 oz (97.8 kg)       Nutrition Goal  Lose weight. Stay away from sugar.       Comment  He has seen his diabetic dietician last month. He snacks sometimes at night and is trying not to do it. He states he needs to eat healtier carbs.       Expected Outcome  Short: eat dinner earlier, snack less before bed. Long: maintain eating dinner earlier and eliminate eating snacks before bed.          Nutrition Goals Discharge (Final Nutrition Goals Re-Evaluation): Nutrition Goals Re-Evaluation - 06/07/17 1045      Goals   Current Weight  215 lb 9.6 oz (97.8 kg)    Nutrition Goal  Lose weight. Stay away from sugar.    Comment  He has seen his diabetic dietician last month. He snacks sometimes at night and is trying not to do it. He states he needs to eat healtier  carbs.    Expected Outcome  Short: eat dinner  earlier, snack less before bed. Long: maintain eating dinner earlier and eliminate eating snacks before bed.       Psychosocial: Target Goals: Acknowledge presence or absence of significant depression and/or stress, maximize coping skills, provide positive support system. Participant is able to verbalize types and ability to use techniques and skills needed for reducing stress and depression.   Initial Review & Psychosocial Screening: Initial Psych Review & Screening - 05/02/17 1104      Initial Review   Current issues with  None Identified      Family Dynamics   Good Support System?  Yes    Comments  His family is a great support system      Barriers   Psychosocial barriers to participate in program  The patient should benefit from training in stress management and relaxation.      Screening Interventions   Interventions  Encouraged to exercise       Quality of Life Scores:  Scores of 19 and below usually indicate a poorer quality of life in these areas.  A difference of  2-3 points is a clinically meaningful difference.  A difference of 2-3 points in the total score of the Quality of Life Index has been associated with significant improvement in overall quality of life, self-image, physical symptoms, and general health in studies assessing change in quality of life.  PHQ-9: Recent Review Flowsheet Data    Depression screen [facility] 2/9 06/12/2017 05/15/2017 05/02/2017 07/11/2016 04/12/2016   Decreased Interest 0 0 0 0 0   Down, Depressed, Hopeless 0 0 0 0 0   PHQ - 2 Score 0 0 0 0 0   Altered sleeping 0 - 1 0 1   Tired, decreased energy 0 - 1 0 0   Change in appetite 0 - 0 0 1   Feeling bad or failure about yourself  0 - 0 0 0   Trouble concentrating 0 - 0 0 0   Moving slowly or fidgety/restless 0 - 0 0 0   Suicidal thoughts 0 - 0 0 0   PHQ-9 Score 0 - 2 0 2   Difficult doing work/chores Not difficult at all - Not difficult at all Not difficult at all Not difficult at all      Interpretation of Total Score  Total Score Depression Severity:  1-4 = Minimal depression, 5-9 = Mild depression, 10-14 = Moderate depression, 15-19 = Moderately severe depression, 20-27 = Severe depression   Psychosocial Evaluation and Intervention: Psychosocial Evaluation - 05/29/17 1042      Psychosocial Evaluation & Interventions   Interventions  Encouraged to exercise with the program and follow exercise prescription    Comments  Mr. Ferrante Dedman)  has returned to this Pulmonary Rehab program due to exposure to Mold in the fall causing a severe reaction in his pulmonary status.  He is a 67 year old who has a strong support system with a spouse of 72 years and very active in his local church.  He has multiple health issues on top of the Pulmonary concerns; including recent diagnosis of diabetes; open heart surgery 12 years ago and he has a current Aortic Abdominal Aneurysm (AAA) which is being monitored by a doctor for potential surgery in the near future.  He sleeps well and has a good appetite.  Serafin denies a history of depression or anxiety or any current symptoms and other than his health, he reports  no stress in his life.  He has goals to breathe better and get back to normal activities again.  Staff will follow.    Expected Outcomes  Shahil will benefit from consistent exercise to achieve his stated goals.  The educational and psychoeducational components of this program will be helpful in understanding and managing his health in a more positive way.  Staff will follow..    Continue Psychosocial Services   Follow up required by staff       Psychosocial Re-Evaluation: Psychosocial Re-Evaluation    [place] Name 06/07/17 1049             Psychosocial Re-Evaluation   Current issues with  None Identified       Comments  [employer] shoots for a hobby and does church work. He has no stress concerns. He knows his COPD well and knows what to do to take care of himself. He is enjoying the  program and he is happy to be losing weight.       Expected Outcomes  Short: continue to attened [facility]. Long: maintain exercise program to keep stress to a minimum.       Interventions  Encouraged to attend Pulmonary Rehabilitation for the exercise          Psychosocial Discharge (Final Psychosocial Re-Evaluation): Psychosocial Re-Evaluation - 06/07/17 1049      Psychosocial Re-Evaluation   Current issues with  None Identified    Comments  Berneta Sages shoots for a hobby and does church work. He has no stress concerns. He knows his COPD well and knows what to do to take care of himself. He is enjoying the program and he is happy to be losing weight.    Expected Outcomes  Short: continue to attened [facility]. Long: maintain exercise program to keep stress to a minimum.    Interventions  Encouraged to attend Pulmonary Rehabilitation for the exercise       Education: Education Goals: Education classes will be provided on a weekly basis, covering required topics. Participant will state understanding/return demonstration of topics presented.  Learning Barriers/Preferences: Learning Barriers/Preferences - 05/02/17 1237      Learning Barriers/Preferences   Learning Barriers  None    Learning Preferences  None       Education Topics:  Initial Evaluation Education: - Verbal, written and demonstration of respiratory meds, oximetry and breathing techniques. Instruction on use of nebulizers and MDIs and importance of monitoring MDI activations.   Pulmonary Rehab from 06/14/2017 in [facility] Cardiac and Pulmonary Rehab  Date  05/02/17  Educator  [facility]  Instruction Review Code  1- Verbalizes Understanding      General Nutrition Guidelines/Fats and Fiber: -Group instruction provided by verbal, written material, models and posters to present the general guidelines for heart healthy nutrition. Gives an explanation and review of dietary fats and fiber.   Pulmonary Rehab from 06/14/2017 in [facility] Cardiac and  Pulmonary Rehab  Date  05/08/17  Educator  CR  Instruction Review Code  1- Verbalizes Understanding      Controlling Sodium/Reading Food Labels: -Group verbal and written material supporting the discussion of sodium use in heart healthy nutrition. Review and explanation with models, verbal and written materials for utilization of the food label.   Pulmonary Rehab from 06/14/2017 in [facility] Cardiac and Pulmonary Rehab  Date  05/15/17  Educator  PI  Instruction Review Code  1- Verbalizes Understanding      Exercise Physiology & General Exercise Guidelines: - Group verbal and written instruction with  models to review the exercise physiology of the cardiovascular system and associated critical values. Provides general exercise guidelines with specific guidelines to those with heart or lung disease.    Pulmonary Rehab from 07/15/2016 in [facility] Cardiac and Pulmonary Rehab  Date  07/15/16  Educator  Nada Maclachlan, EP  Instruction Review Code (retired)  2- meets goals/outcomes      Aerobic Exercise & Resistance Training: - Gives group verbal and written instruction on the various components of exercise. Focuses on aerobic and resistive training programs and the benefits of this training and how to safely progress through these programs.   Pulmonary Rehab from 07/15/2016 in [facility] Cardiac and Pulmonary Rehab  Date  05/11/16  Educator  [facility]  Instruction Review Code (retired)  2- Statistician, Balance, Mind/Body Relaxation: Provides group verbal/written instruction on the benefits of flexibility and balance training, including mind/body exercise modes such as yoga, pilates and tai chi.  Demonstration and skill practice provided.   Pulmonary Rehab from 06/14/2017 in [facility] Cardiac and Pulmonary Rehab  Date  05/24/17  Educator  AS  Instruction Review Code  1- Verbalizes Understanding      Stress and Anxiety: - Provides group verbal and written instruction about the health risks  of elevated stress and causes of high stress.  Discuss the correlation between heart/lung disease and anxiety and treatment options. Review healthy ways to manage with stress and anxiety.   Pulmonary Rehab from 06/14/2017 in [facility] Cardiac and Pulmonary Rehab  Date  06/14/17  Educator  [facility]  Instruction Review Code  1- Verbalizes Understanding      Depression: - Provides group verbal and written instruction on the correlation between heart/lung disease and depressed mood, treatment options, and the stigmas associated with seeking treatment.   Pulmonary Rehab from 07/15/2016 in [facility] Cardiac and Pulmonary Rehab  Date  06/15/16  Educator  [facility]  Instruction Review Code (retired)  2- meets goals/outcomes      Exercise & Equipment Safety: - Individual verbal instruction and demonstration of equipment use and safety with use of the equipment.   Pulmonary Rehab from 06/14/2017 in [facility] Cardiac and Pulmonary Rehab  Date  05/02/17  Educator  [facility]  Instruction Review Code  1- Verbalizes Understanding      Infection Prevention: - Provides verbal and written material to individual with discussion of infection control including proper hand washing and proper equipment cleaning during exercise session.   Pulmonary Rehab from 06/14/2017 in [facility] Cardiac and Pulmonary Rehab  Date  05/02/17  Educator  [facility]  Instruction Review Code  1- Verbalizes Understanding      Falls Prevention: - Provides verbal and written material to individual with discussion of falls prevention and safety.   Pulmonary Rehab from 06/14/2017 in [facility] Cardiac and Pulmonary Rehab  Date  05/02/17  Educator  [facility]  Instruction Review Code  1- Verbalizes Understanding      Diabetes: - Individual verbal and written instruction to review signs/symptoms of diabetes, desired ranges of glucose level fasting, after meals and with exercise. Advice that pre and post exercise glucose checks will be done for 3 sessions at entry of program.   Chronic Lung  Diseases: - Group verbal and written instruction to review updates, respiratory medications, advancements in procedures and treatments. Discuss use of supplemental oxygen including available portable oxygen systems, continuous and intermittent flow rates, concentrators, personal use and safety guidelines. Review proper use of inhaler and spacers. Provide informative websites for self-education.  Pulmonary Rehab from 07/15/2016 in [facility] Cardiac and Pulmonary Rehab  Date  05/04/16  Educator  LB  Instruction Review Code (retired)  2- meets Chief Financial Officer: - Provide group verbal and written instruction for methods to conserve energy, plan and organize activities. Instruct on pacing techniques, use of adaptive equipment and posture/positioning to relieve shortness of breath.   Pulmonary Rehab from 06/14/2017 in [facility] Cardiac and Pulmonary Rehab  Date  06/07/17  Educator  [facility]  Instruction Review Code  1- Verbalizes Understanding      Triggers and Exacerbations: - Group verbal and written instruction to review types of environmental triggers and ways to prevent exacerbations. Discuss weather changes, air quality and the benefits of nasal washing. Review warning signs and symptoms to help prevent infections. Discuss techniques for effective airway clearance, coughing, and vibrations.   Pulmonary Rehab from 07/15/2016 in [facility] Cardiac and Pulmonary Rehab  Date  06/01/16  Educator  LB  Instruction Review Code (retired)  2- meets goals/outcomes      AED/CPR: - Group verbal and written instruction with the use of models to demonstrate the basic use of the AED with the basic ABC's of resuscitation.   Pulmonary Rehab from 06/14/2017 in [facility] Cardiac and Pulmonary Rehab  Date  06/09/17  Educator  [facility]  Instruction Review Code  1- Actuary and Physiology of the Lungs: - Group verbal and written instruction with the use of models to provide basic lung  anatomy and physiology related to function, structure and complications of lung disease.   Pulmonary Rehab from 07/15/2016 in [facility] Cardiac and Pulmonary Rehab  Date  07/01/16  Educator  LB  Instruction Review Code (retired)  2- meets Designer, fashion/clothing & Physiology of the Heart: - Group verbal and written instruction and models provide basic cardiac anatomy and physiology, with the coronary electrical and arterial systems. Review of Valvular disease and Heart Failure   Pulmonary Rehab from 06/14/2017 in [facility] Cardiac and Pulmonary Rehab  Date  05/10/17  Educator  [facility]  Instruction Review Code  1- Verbalizes Understanding      Cardiac Medications: - Group verbal and written instruction to review commonly prescribed medications for heart disease. Reviews the medication, class of the drug, and side effects.   Pulmonary Rehab from 06/14/2017 in [facility] Cardiac and Pulmonary Rehab  Date  05/26/17  Educator  [facility]  Instruction Review Code  5- Refused Teaching      Know Your Numbers and Risk Factors: -Group verbal and written instruction about important numbers in your health.  Discussion of what are risk factors and how they play a role in the disease process.  Review of Cholesterol, Blood Pressure, Diabetes, and BMI and the role they play in your overall health.   Sleep Hygiene: -Provides group verbal and written instruction about how sleep can affect your health.  Define sleep hygiene, discuss sleep cycles and impact of sleep habits. Review good sleep hygiene tips.    Other: -Provides group and verbal instruction on various topics (see comments)    Knowledge Questionnaire Score: Knowledge Questionnaire Score - 05/02/17 1107      Knowledge Questionnaire Score   Pre Score  14/18 Reviewed with patient        Core Components/Risk Factors/Patient Goals at Admission: Personal Goals and Risk Factors at Admission - 05/02/17 1111      Core Components/Risk Factors/Patient Goals on  Admission  Weight Management  Yes;Weight Loss    Intervention  Weight Management: Develop a combined nutrition and exercise program designed to reach desired caloric intake, while maintaining appropriate intake of nutrient and fiber, sodium and fats, and appropriate energy expenditure required for the weight goal.;Weight Management: Provide education and appropriate resources to help participant work on and attain dietary goals.;Obesity: Provide education and appropriate resources to help participant work on and attain dietary goals.    Admit Weight  219 lb 4.8 oz (99.5 kg)    Goal Weight: Short Term  214 lb (97.1 kg)    Goal Weight: Long Term  180 lb (81.6 kg)    Expected Outcomes  Short Term: Continue to assess and modify interventions until short term weight is achieved;Long Term: Adherence to nutrition and physical activity/exercise program aimed toward attainment of established weight goal;Weight Loss: Understanding of general recommendations for a balanced deficit meal plan, which promotes 1-2 lb weight loss per week and includes a negative energy balance of [phone] kcal/d;Understanding recommendations for meals to include 15-35% energy as protein, 25-35% energy from fat, 35-60% energy from carbohydrates, less than 234m of dietary cholesterol, 20-35 gm of total fiber daily;Understanding of distribution of calorie intake throughout the day with the consumption of 4-5 meals/snacks    Improve shortness of breath with ADL's  Yes    Intervention  Provide education, individualized exercise plan and daily activity instruction to help decrease symptoms of SOB with activities of daily living.    Expected Outcomes  Short Term: Improve cardiorespiratory fitness to achieve a reduction of symptoms when performing ADLs;Long Term: Be able to perform more ADLs without symptoms or delay the onset of symptoms    Diabetes  Yes pre diabetic    Intervention  Provide education about signs/symptoms and action to take  for hypo/hyperglycemia.;Provide education about proper nutrition, including hydration, and aerobic/resistive exercise prescription along with prescribed medications to achieve blood glucose in normal ranges: Fasting glucose 65-99 mg/dL    Expected Outcomes  Short Term: Participant verbalizes understanding of the signs/symptoms and immediate care of hyper/hypoglycemia, proper foot care and importance of medication, aerobic/resistive exercise and nutrition plan for blood glucose control.;Long Term: Attainment of HbA1C < 7%.    Hypertension  Yes    Intervention  Provide education on lifestyle modifcations including regular physical activity/exercise, weight management, moderate sodium restriction and increased consumption of fresh fruit, vegetables, and low fat dairy, alcohol moderation, and smoking cessation.;Monitor prescription use compliance.    Expected Outcomes  Short Term: Continued assessment and intervention until BP is < 140/985mHG in hypertensive participants. < 130/802mG in hypertensive participants with diabetes, heart failure or chronic kidney disease.;Long Term: Maintenance of blood pressure at goal levels.    Lipids  Yes    Intervention  Provide education and support for participant on nutrition & aerobic/resistive exercise along with prescribed medications to achieve LDL <73m36mDL >40mg3m Expected Outcomes  Short Term: Participant states understanding of desired cholesterol values and is compliant with medications prescribed. Participant is following exercise prescription and nutrition guidelines.;Long Term: Cholesterol controlled with medications as prescribed, with individualized exercise RX and with personalized nutrition plan. Value goals: LDL < 73mg,13m > 40 mg.       Core Components/Risk Factors/Patient Goals Review:  Goals and Risk Factor Review    Row Name 06/07/17 1040             Core Components/Risk Factors/Patient Goals Review   Personal Goals Review  Weight  Management/Obesity;Improve  shortness of breath with ADL's;Lipids;Hypertension;Diabetes       Review  Sanchez has conme down about 5 pounds since the start of the program. Blood pressure has been under control. He does not mow the yard but is not limited to home activities asd chores. He takes his time and paces himself.        Expected Outcomes  Short: continue attending [facility] and manage COPD. Long: maintain exercise routine post [facility] for Diabetes and breathing.          Core Components/Risk Factors/Patient Goals at Discharge (Final Review):  Goals and Risk Factor Review - 06/07/17 1040      Core Components/Risk Factors/Patient Goals Review   Personal Goals Review  Weight Management/Obesity;Improve shortness of breath with ADL's;Lipids;Hypertension;Diabetes    Review  Amol has conme down about 5 pounds since the start of the program. Blood pressure has been under control. He does not mow the yard but is not limited to home activities asd chores. He takes his time and paces himself.     Expected Outcomes  Short: continue attending [facility] and manage COPD. Long: maintain exercise routine post [facility] for Diabetes and breathing.       ITP Comments: ITP Comments    Row Name 05/02/17 1043 05/02/17 1044 05/22/17 0906 06/19/17 0817     ITP Comments  -  Medical Evaluation completed. Chart sent for review and changes to Dr. Emily Filbert Director of [place]. Diagnosis can be found in [facility] encounter 05/02/16  30 day review completed. ITP sent to Dr. Emily Filbert Director of [place]. Continue with ITP unless changes are made by physician.  30 day review completed. ITP sent to Dr. Emily Filbert Director of [place]. Continue with ITP unless changes are made by physician.       Comments: 30 day review

## 2017-06-19 NOTE — Progress Notes (Signed)
Daily Session Note  Patient Details  Name: Melvin Brewer MRN: [id number] Date of Birth: 06-Aug-1950 Referring Provider:     Pulmonary Rehab from 05/02/2017 in [facility] Cardiac and Pulmonary Rehab  Referring Provider  [place]      Encounter Date: 06/19/2017  Check In: Session Check In - 06/19/17 1005      Check-In   Location  [facility]-Cardiac & Pulmonary Rehab    Staff Present  Nada Maclachlan, BA, ACSM CEP, Exercise Physiologist;Khamya Topp Tedd Sias, [place], ACSM CEP, Exercise Physiologist    Supervising physician immediately available to respond to emergencies  [facility] immediately available ER MD    Physician(s)  Dr. Corky Downs and Jimmye Norman    Medication changes reported      No    Fall or balance concerns reported     No    Tobacco Cessation  No Change    Warm-up and Cool-down  Performed as group-led instruction    Resistance Training Performed  Yes    VAD Patient?  No      Pain Assessment   Currently in Pain?  No/denies          Social History   Tobacco Use  Smoking Status Former Smoker  . Packs/day: 2.00  . Years: 43.00  . Pack years: 86.00  . Types: Cigarettes  . Last attempt to quit: 04/11/2004  . Years since quitting: 13.1  Smokeless Tobacco Never Used    Goals Met:  Independence with exercise equipment Exercise tolerated well No report of cardiac concerns or symptoms Strength training completed today  Goals Unmet:  Not Applicable  Comments: Pt able to follow exercise prescription today without complaint.  Will continue to monitor for progression.   Dr. Emily Filbert is Medical Director for [place] and [facility].

## 2017-06-23 DIAGNOSIS — J441 Chronic obstructive pulmonary disease with (acute) exacerbation: Secondary | ICD-10-CM | POA: Diagnosis not present

## 2017-06-23 DIAGNOSIS — J449 Chronic obstructive pulmonary disease, unspecified: Secondary | ICD-10-CM

## 2017-06-23 NOTE — Progress Notes (Signed)
Daily Session Note  Patient Details  Name: Melvin Brewer MRN: [id number] Date of Birth: 05/06/1950 Referring Provider:     Pulmonary Rehab from 05/02/2017 in [facility] Cardiac and Pulmonary Rehab  Referring Provider  [place]      Encounter Date: 06/23/2017  Check In: Session Check In - 06/23/17 1005      Check-In   Location  [facility]-Cardiac & Pulmonary Rehab    Staff Present  Joseph Hood RCP,RRT,BSRT;Meredith Craven, RN BSN;Mandi Ballard, BS, PEC    Supervising physician immediately available to respond to emergencies  [facility] immediately available ER MD    Physician(s)  Dr. Kinner and McShane    Medication changes reported      No    Fall or balance concerns reported     No    Tobacco Cessation  No Change    Warm-up and Cool-down  Performed as group-led instruction    Resistance Training Performed  Yes    VAD Patient?  No      Pain Assessment   Currently in Pain?  No/denies          Social History   Tobacco Use  Smoking Status Former Smoker  . Packs/day: 2.00  . Years: 43.00  . Pack years: 86.00  . Types: Cigarettes  . Last attempt to quit: 04/11/2004  . Years since quitting: 13.2  Smokeless Tobacco Never Used    Goals Met:  Independence with exercise equipment Exercise tolerated well No report of cardiac concerns or symptoms Strength training completed today  Goals Unmet:  Not Applicable  Comments: Pt able to follow exercise prescription today without complaint.  Will continue to monitor for progression.   Dr. Mark Miller is Medical Director for [facility] and [facility]. 

## 2017-06-26 ENCOUNTER — Encounter: Payer: Self-pay | Admitting: Dietician

## 2017-06-26 ENCOUNTER — Encounter: Payer: Medicare Other | Admitting: Dietician

## 2017-06-26 VITALS — BP 166/80 | Ht 69.0 in | Wt 215.3 lb

## 2017-06-26 DIAGNOSIS — J449 Chronic obstructive pulmonary disease, unspecified: Secondary | ICD-10-CM

## 2017-06-26 DIAGNOSIS — E119 Type 2 diabetes mellitus without complications: Secondary | ICD-10-CM

## 2017-06-26 DIAGNOSIS — J441 Chronic obstructive pulmonary disease with (acute) exacerbation: Secondary | ICD-10-CM | POA: Diagnosis not present

## 2017-06-26 NOTE — Progress Notes (Signed)

## 2017-06-26 NOTE — Progress Notes (Signed)
Daily Session Note  Patient Details  Name: Melvin Brewer MRN: [id number] Date of Birth: 05/16/1950 Referring Provider:     Pulmonary Rehab from 05/02/2017 in [facility] Cardiac and Pulmonary Rehab  Referring Provider  [place]      Encounter Date: 06/26/2017  Check In: Session Check In - 06/26/17 0942      Check-In   Location  [facility]-Cardiac & Pulmonary Rehab    Staff Present  Earlean Shawl, BS, ACSM CEP, Exercise Physiologist;Amanda Oletta Darter, BA, ACSM CEP, Exercise Physiologist    Supervising physician immediately available to respond to emergencies  [facility] immediately available ER MD    Physician(s)  Dr. Burlene Arnt and Corky Downs    Medication changes reported      No    Fall or balance concerns reported     No    Tobacco Cessation  No Change    Warm-up and Cool-down  Performed as group-led instruction    Resistance Training Performed  Yes    VAD Patient?  No      Pain Assessment   Currently in Pain?  No/denies          Social History   Tobacco Use  Smoking Status Former Smoker  . Packs/day: 2.00  . Years: 43.00  . Pack years: 86.00  . Types: Cigarettes  . Last attempt to quit: 04/11/2004  . Years since quitting: 13.2  Smokeless Tobacco Never Used    Goals Met:  Independence with exercise equipment Exercise tolerated well No report of cardiac concerns or symptoms Strength training completed today  Goals Unmet:  Not Applicable  Comments: Pt able to follow exercise prescription today without complaint.  Will continue to monitor for progression.   Dr. Emily Filbert is Medical Director for [place] and [facility].

## 2017-06-28 DIAGNOSIS — J441 Chronic obstructive pulmonary disease with (acute) exacerbation: Secondary | ICD-10-CM | POA: Diagnosis not present

## 2017-06-28 DIAGNOSIS — J449 Chronic obstructive pulmonary disease, unspecified: Secondary | ICD-10-CM

## 2017-06-28 NOTE — Progress Notes (Signed)
Daily Session Note  Patient Details  Name: Melvin Brewer MRN: [id number] Date of Birth: [birth year] Referring Provider:     Pulmonary Rehab from 05/02/2017 in [facility] Cardiac and Pulmonary Rehab  Referring Provider  [place]      Encounter Date: 06/28/2017  Check In: Session Check In - 06/28/17 0943      Check-In   Location  [facility]-Cardiac & Pulmonary Rehab    Staff Present  Justin Mend Lorre Nick, MA, RCEP, CCRP, Exercise Physiologist;Amanda Oletta Darter, [place], ACSM CEP, Exercise Physiologist    Supervising physician immediately available to respond to emergencies  [facility] immediately available ER MD    Physician(s)  Dr. Joni Fears and Jimmye Norman    Medication changes reported      No    Fall or balance concerns reported     No    Tobacco Cessation  No Change    Warm-up and Cool-down  Performed as group-led instruction    Resistance Training Performed  Yes    VAD Patient?  No      Pain Assessment   Currently in Pain?  No/denies          Social History   Tobacco Use  Smoking Status Former Smoker  . Packs/day: 2.00  . Years: 43.00  . Pack years: 86.00  . Types: Cigarettes  . Last attempt to quit: 04/11/2004  . Years since quitting: 13.2  Smokeless Tobacco Never Used    Goals Met:  Independence with exercise equipment Exercise tolerated well No report of cardiac concerns or symptoms Strength training completed today  Goals Unmet:  Not Applicable  Comments: Pt able to follow exercise prescription today without complaint.  Will continue to monitor for progression.   Dr. Emily Filbert is Medical Director for [place] and [facility].

## 2017-06-30 ENCOUNTER — Encounter: Payer: Medicare Other | Admitting: *Deleted

## 2017-06-30 DIAGNOSIS — J441 Chronic obstructive pulmonary disease with (acute) exacerbation: Secondary | ICD-10-CM | POA: Diagnosis not present

## 2017-06-30 DIAGNOSIS — J449 Chronic obstructive pulmonary disease, unspecified: Secondary | ICD-10-CM

## 2017-06-30 NOTE — Progress Notes (Signed)
Daily Session Note  Patient Details  Name: DANIYAL TABOR MRN: [id number] Date of Birth: [birth year] Referring Provider:     Pulmonary Rehab from 05/02/2017 in [facility] Cardiac and Pulmonary Rehab  Referring Provider  [place]      Encounter Date: 06/30/2017  Check In: Session Check In - 06/30/17 1006      Check-In   Location  [facility]Cardiac & Pulmonary Rehab    Staff Present  Nada Maclachlan, BA, ACSM CEP, Exercise Physiologist;Meredith Sherryll Burger, RN BSN;Joseph Flavia Shipper    Supervising physician immediately available to respond to emergencies  [facility] immediately available ER MD    Physician(s)  Drs. Joni Fears and [place]    Medication changes reported      No    Fall or balance concerns reported     No    Warm-up and Cool-down  Performed as group-led Higher education careers adviser Performed  Yes    VAD Patient?  No      Pain Assessment   Currently in Pain?  No/denies          Social History   Tobacco Use  Smoking Status Former Smoker  . Packs/day: 2.00  . Years: 43.00  . Pack years: 86.00  . Types: Cigarettes  . Last attempt to quit: 04/11/2004  . Years since quitting: 13.2  Smokeless Tobacco Never Used    Goals Met:  Proper associated with RPD/PD & O2 Sat Independence with exercise equipment Using PLB without cueing & demonstrates good technique Exercise tolerated well Personal goals reviewed No report of cardiac concerns or symptoms Strength training completed today  Goals Unmet:  Not Applicable  Comments: Pt able to follow exercise prescription today without complaint.  Will continue to monitor for progression.    Dr. Emily Filbert is Medical Director for [place] and [facility].

## 2017-07-03 ENCOUNTER — Encounter: Payer: Self-pay | Admitting: Dietician

## 2017-07-03 DIAGNOSIS — J449 Chronic obstructive pulmonary disease, unspecified: Secondary | ICD-10-CM

## 2017-07-03 DIAGNOSIS — J441 Chronic obstructive pulmonary disease with (acute) exacerbation: Secondary | ICD-10-CM | POA: Diagnosis not present

## 2017-07-03 NOTE — Progress Notes (Signed)
Daily Session Note  Patient Details  Name: Melvin Brewer  MRN: [id number] Date of Birth: 1951/03/20 Referring Provider:     Pulmonary Rehab from 05/02/2017 in [facility] Cardiac and Pulmonary Rehab  Referring Provider  [place]      Encounter Date: 07/03/2017  Check In: Session Check In - 07/03/17 1009      Check-In   Location  [facility]-Cardiac & Pulmonary Rehab    Staff Present  Justin Mend RCP,RRT,BSRT;Amanda Oletta Darter, BA, ACSM CEP, Exercise Physiologist;Kelly Amedeo Plenty, BS, ACSM CEP, Exercise Physiologist    Supervising physician immediately available to respond to emergencies  [facility] immediately available ER MD    Physician(s)  Dr. Alfred Levins and Siadecki    Medication changes reported      No    Fall or balance concerns reported     No    Tobacco Cessation  No Change    Warm-up and Cool-down  Performed as group-led instruction    Resistance Training Performed  Yes    VAD Patient?  No      Pain Assessment   Currently in Pain?  No/denies          Social History   Tobacco Use  Smoking Status Former Smoker  . Packs/day: 2.00  . Years: 43.00  . Pack years: 86.00  . Types: Cigarettes  . Last attempt to quit: 04/11/2004  . Years since quitting: 13.2  Smokeless Tobacco Never Used    Goals Met:  Independence with exercise equipment Exercise tolerated well No report of cardiac concerns or symptoms Strength training completed today  Goals Unmet:  Not Applicable  Comments: Pt able to follow exercise prescription today without complaint.  Will continue to monitor for progression.   Dr. Emily Filbert is Medical Director for [place] and [facility].

## 2017-07-05 DIAGNOSIS — J441 Chronic obstructive pulmonary disease with (acute) exacerbation: Secondary | ICD-10-CM | POA: Diagnosis not present

## 2017-07-05 DIAGNOSIS — J449 Chronic obstructive pulmonary disease, unspecified: Secondary | ICD-10-CM

## 2017-07-05 NOTE — Progress Notes (Signed)
Daily Session Note  Patient Details  Name: Melvin Brewer MRN: [id number] Date of Birth: [birth year] Referring Provider:     Pulmonary Rehab from 05/02/2017 in [facility] Cardiac and Pulmonary Rehab  Referring Provider  [place]      Encounter Date: 07/05/2017  Check In: Session Check In - 07/05/17 1007      Check-In   Location  [facility]-Cardiac & Pulmonary Rehab    Staff Present  Nada Maclachlan, BA, ACSM CEP, Exercise Physiologist;Jessica Luan Pulling, MA, RCEP, CCRP, Exercise Physiologist;Koi Yarbro Flavia Shipper    Supervising physician immediately available to respond to emergencies  [facility] immediately available ER MD    Physician(s)  Dr. Mariea Clonts and Jimmye Norman    Medication changes reported      No    Fall or balance concerns reported     No    Tobacco Cessation  No Change    Warm-up and Cool-down  Performed as group-led instruction    Resistance Training Performed  Yes    VAD Patient?  No      Pain Assessment   Currently in Pain?  No/denies          Social History   Tobacco Use  Smoking Status Former Smoker  . Packs/day: 2.00  . Years: 43.00  . Pack years: 86.00  . Types: Cigarettes  . Last attempt to quit: 04/11/2004  . Years since quitting: 13.2  Smokeless Tobacco Never Used    Goals Met:  Independence with exercise equipment Exercise tolerated well No report of cardiac concerns or symptoms Strength training completed today  Goals Unmet:  Not Applicable  Comments: Pt able to follow exercise prescription today without complaint.  Will continue to monitor for progression.   Dr. Emily Filbert is Medical Director for [place] and [facility].

## 2017-07-07 ENCOUNTER — Encounter: Payer: Medicare Other | Admitting: *Deleted

## 2017-07-07 DIAGNOSIS — J449 Chronic obstructive pulmonary disease, unspecified: Secondary | ICD-10-CM

## 2017-07-07 DIAGNOSIS — J441 Chronic obstructive pulmonary disease with (acute) exacerbation: Secondary | ICD-10-CM | POA: Diagnosis not present

## 2017-07-07 NOTE — Progress Notes (Signed)
Daily Session Note  Patient Details  Name: Melvin Brewer MRN: [id number] Date of Birth: [birth year] Referring Provider:     Pulmonary Rehab from 05/02/2017 in [facility] Cardiac and Pulmonary Rehab  Referring Provider  [place]      Encounter Date: 07/07/2017  Check In: Session Check In - 07/07/17 1018      Check-In   Location  [facility]Cardiac & Pulmonary Rehab    Staff Present  Nyoka Cowden, RN, BSN, MA;Katori Wirsing Sherryll Burger, RN Vickki Hearing, BA, ACSM CEP, Exercise Physiologist    Supervising physician immediately available to respond to emergencies  [facility] immediately available ER MD    Physician(s)  Dr. Jacqualine Code and Reita Cliche    Medication changes reported      No    Fall or balance concerns reported     No    Tobacco Cessation  No Change    Warm-up and Cool-down  Performed as group-led instruction    Resistance Training Performed  Yes    VAD Patient?  No      Pain Assessment   Currently in Pain?  No/denies          Social History   Tobacco Use  Smoking Status Former Smoker  . Packs/day: 2.00  . Years: 43.00  . Pack years: 86.00  . Types: Cigarettes  . Last attempt to quit: 04/11/2004  . Years since quitting: 13.2  Smokeless Tobacco Never Used    Goals Met:  Proper associated with RPD/PD & O2 Sat Independence with exercise equipment Using PLB without cueing & demonstrates good technique Exercise tolerated well Strength training completed today  Goals Unmet:  Not Applicable  Comments: Pt able to follow exercise prescription today without complaint.  Will continue to monitor for progression.     Dr. Emily Filbert is Medical Director for [place] and [facility].

## 2017-07-10 ENCOUNTER — Encounter: Payer: Medicare Other | Attending: Family Medicine | Admitting: *Deleted

## 2017-07-10 DIAGNOSIS — J441 Chronic obstructive pulmonary disease with (acute) exacerbation: Secondary | ICD-10-CM | POA: Diagnosis present

## 2017-07-10 DIAGNOSIS — E119 Type 2 diabetes mellitus without complications: Secondary | ICD-10-CM | POA: Diagnosis not present

## 2017-07-10 DIAGNOSIS — J449 Chronic obstructive pulmonary disease, unspecified: Secondary | ICD-10-CM

## 2017-07-10 NOTE — Progress Notes (Signed)
Daily Session Note  Patient Details  Name: Melvin Brewer MRN: [id number] Date of Birth: 02/26/1951 Referring Provider:     Pulmonary Rehab from 05/02/2017 in [facility] Cardiac and Pulmonary Rehab  Referring Provider  [place]      Encounter Date: 07/10/2017  Check In: Session Check In - 07/10/17 1016      Check-In   Location  [facility]-Cardiac & Pulmonary Rehab    Staff Present  Earlean Shawl, BS, ACSM CEP, Exercise Physiologist;Amanda Oletta Darter, BA, ACSM CEP, Exercise Physiologist;Joseph Flavia Shipper    Supervising physician immediately available to respond to emergencies  [facility] immediately available ER MD    Physician(s)  Drs. [place] and [place]     Medication changes reported      No    Fall or balance concerns reported     No    Warm-up and Cool-down  Performed as group-led Higher education careers adviser Performed  Yes    VAD Patient?  No      Pain Assessment   Currently in Pain?  No/denies    Multiple Pain Sites  No          Social History   Tobacco Use  Smoking Status Former Smoker  . Packs/day: 2.00  . Years: 43.00  . Pack years: 86.00  . Types: Cigarettes  . Last attempt to quit: 04/11/2004  . Years since quitting: 13.2  Smokeless Tobacco Never Used    Goals Met:  Proper associated with RPD/PD & O2 Sat Independence with exercise equipment Exercise tolerated well No report of cardiac concerns or symptoms Strength training completed today  Goals Unmet:  Not Applicable  Comments: Pt able to follow exercise prescription today without complaint.  Will continue to monitor for progression.    Dr. Emily Filbert is Medical Director for [place] and [facility].

## 2017-07-12 DIAGNOSIS — J449 Chronic obstructive pulmonary disease, unspecified: Secondary | ICD-10-CM

## 2017-07-12 DIAGNOSIS — J441 Chronic obstructive pulmonary disease with (acute) exacerbation: Secondary | ICD-10-CM | POA: Diagnosis not present

## 2017-07-12 NOTE — Progress Notes (Signed)
Daily Session Note  Patient Details  Name: Melvin Brewer MRN: [id number] Date of Birth: 05-Apr-1951 Referring Provider:     Pulmonary Rehab from 05/02/2017 in [facility] Cardiac and Pulmonary Rehab  Referring Provider  [place]      Encounter Date: 07/12/2017  Check In: Session Check In - 07/12/17 1021      Check-In   Location  [facility]-Cardiac & Pulmonary Rehab    Staff Present  Nada Maclachlan, BA, ACSM CEP, Exercise Physiologist;Lynessa Almanzar Darrin Nipper, [place], RCEP, CCRP, Exercise Physiologist    Supervising physician immediately available to respond to emergencies  [facility] immediately available ER MD    Physician(s)  Dr. Mable Paris and Jimmye Norman    Medication changes reported      No    Fall or balance concerns reported     No    Tobacco Cessation  No Change    Warm-up and Cool-down  Performed as group-led instruction    Resistance Training Performed  Yes    VAD Patient?  No      Pain Assessment   Currently in Pain?  No/denies          Social History   Tobacco Use  Smoking Status Former Smoker  . Packs/day: 2.00  . Years: 43.00  . Pack years: 86.00  . Types: Cigarettes  . Last attempt to quit: 04/11/2004  . Years since quitting: 13.2  Smokeless Tobacco Never Used    Goals Met:  Independence with exercise equipment Exercise tolerated well No report of cardiac concerns or symptoms Strength training completed today  Goals Unmet:  Not Applicable  Comments: Pt able to follow exercise prescription today without complaint.  Will continue to monitor for progression.   Dr. Emily Filbert is Medical Director for [place] and [facility].

## 2017-07-17 DIAGNOSIS — J449 Chronic obstructive pulmonary disease, unspecified: Secondary | ICD-10-CM

## 2017-07-17 DIAGNOSIS — J441 Chronic obstructive pulmonary disease with (acute) exacerbation: Secondary | ICD-10-CM | POA: Diagnosis not present

## 2017-07-17 NOTE — Progress Notes (Signed)
Daily Session Note  Patient Details  Name: Melvin Brewer MRN: [id number] Date of Birth: 1950-06-12 Referring Provider:     Pulmonary Rehab from 05/02/2017 in [facility] Cardiac and Pulmonary Rehab  Referring Provider  [place]      Encounter Date: 07/17/2017  Check In: Session Check In - 07/17/17 0949      Check-In   Location  [facility]-Cardiac & Pulmonary Rehab    Staff Present  Justin Mend Jaci Carrel, BS, ACSM CEP, Exercise Physiologist;Amanda Oletta Darter, [place], ACSM CEP, Exercise Physiologist    Supervising physician immediately available to respond to emergencies  [facility] immediately available ER MD    Physician(s)  Dr. Corky Downs and [facility]    Medication changes reported      No    Fall or balance concerns reported     No    Tobacco Cessation  No Change    Warm-up and Cool-down  Performed as group-led instruction    Resistance Training Performed  Yes    VAD Patient?  No      Pain Assessment   Currently in Pain?  No/denies          Social History   Tobacco Use  Smoking Status Former Smoker  . Packs/day: 2.00  . Years: 43.00  . Pack years: 86.00  . Types: Cigarettes  . Last attempt to quit: 04/11/2004  . Years since quitting: 13.2  Smokeless Tobacco Never Used    Goals Met:  Independence with exercise equipment Exercise tolerated well No report of cardiac concerns or symptoms Strength training completed today  Goals Unmet:  Not Applicable  Comments: Pt able to follow exercise prescription today without complaint.  Will continue to monitor for progression.   Dr. Emily Filbert is Medical Director for [place] and [facility].

## 2017-07-17 NOTE — Progress Notes (Signed)
Pulmonary Individual Treatment Plan  Patient Details  Name: Melvin SANTOSUOSSO MRN: Brewer Date of Birth: 03-16-1951 Referring Provider:     Pulmonary Rehab from 05/02/2017 in [facility]  Referring Provider  Grandis      Initial Encounter Date:    Pulmonary Rehab from 05/02/2017 in [facility] Pulmonary Rehab  Date  05/02/17  Referring Provider  Grandis      Visit Diagnosis: Chronic obstructive pulmonary disease, unspecified COPD type ([place])  Patient's Home Medications on Admission:  Current Outpatient Medications:  .  albuterol (PROVENTIL HFA;VENTOLIN HFA) 108 (90 BASE) MCG/ACT inhaler, Inhale 2 puffs into the lungs every 6 (six) hours as needed for wheezing or shortness of breath., Disp: , Rfl:  .  amLODipine (NORVASC) 2.5 MG tablet, Take 1 tablet by mouth daily., Disp: , Rfl:  .  aspirin EC 81 MG tablet, Take 1 tablet by mouth daily., Disp: , Rfl:  .  atorvastatin (LIPITOR) 40 MG tablet, Take 1 tablet by mouth daily., Disp: , Rfl:  .  budesonide-formoterol (SYMBICORT) 160-4.5 MCG/ACT inhaler, Place 2 sprays into the nose 2 (two) times daily., Disp: , Rfl:  .  Cetirizine HCl (ZYRTEC ALLERGY) 10 MG CAPS, Take 1 capsule by mouth daily., Disp: , Rfl:  .  Cholecalciferol (VITAMIN D-1000 MAX ST) 1000 units tablet, Take 1 tablet by mouth daily., Disp: , Rfl:  .  DENTA 5000 PLUS 1.1 % CREA dental cream, Take 1 application by mouth 3 (three) times daily. Brush teeth for at least 1 min 2-3 times a day, Disp: , Rfl: 5 .  docusate sodium 100 MG CAPS, Take 100 mg by mouth 2 (two) times daily. (Patient not taking: Reported on 05/15/2017), Disp: 60 capsule, Rfl: 0 .  fluticasone (FLONASE) 50 MCG/ACT nasal spray, Place 1 spray into both nostrils daily., Disp: , Rfl:  .  metFORMIN (GLUCOPHAGE) 500 MG tablet, Take 1 tablet by mouth daily., Disp: , Rfl:  .  metoprolol (LOPRESSOR) 50 MG tablet, Take 50 mg by mouth 2 (two) times daily., Disp: , Rfl:  .  theophylline  (UNIPHYL) 400 MG 24 hr tablet, Take 1 tablet by mouth daily., Disp: , Rfl:  .  Tiotropium Bromide Monohydrate (SPIRIVA RESPIMAT) 2.5 MCG/ACT AERS, Inhale 2 Inhalers into the lungs daily., Disp: , Rfl:   Past Medical History: Past Medical History:  Diagnosis Date  . Abdominal aortic aneurysm (AAA) ([place])   . COPD (chronic obstructive pulmonary disease) ([place])   . Coronary artery disease   . H/O hiatal hernia    AGE 60 S  NO MEDS  . Hyperlipidemia   . Hypertension   . Shortness of breath    W/ EXERTION     Tobacco Use: Social History   Tobacco Use  Smoking Status Former Smoker  . Packs/day: 2.00  . Years: 43.00  . Pack years: 86.00  . Types: Cigarettes  . Last attempt to quit: 04/11/2004  . Years since quitting: 13.2  Smokeless Tobacco Never Used    Labs: Recent Review Flowsheet Data    There is no flowsheet data to display.       Pulmonary Assessment Scores: Pulmonary Assessment Scores    Row Name 05/02/17 1106 06/12/17 1126       ADL UCSD   ADL Phase  Entry  Mid    SOB Score total  44  3    Rest  0  0    Walk  0  1    Stairs  3  3    Bath  2  0    Dress  2  1    Shop  2  1      CAT Score   CAT Score  20  -      mMRC Score   mMRC Score  1  -       Pulmonary Function Assessment: Pulmonary Function Assessment - 05/02/17 1112      Initial Spirometry Results   FVC%  31 %    FEV1%  34 %    FEV1/FVC Ratio  74.02    Comments  Good patient effort      Post Bronchodilator Spirometry Results   FVC%  36.58 %    FEV1%  36.72 %    FEV1/FVC Ratio  73.38    Comments  Good patient effort      Breath   Bilateral Breath Sounds  Clear;Decreased    Shortness of Breath  Yes;Limiting activity       Exercise Target Goals:    Exercise Program Goal: Individual exercise prescription set using results from initial 6 min walk test and THRR while considering  patient's activity barriers and safety.    Exercise Prescription Goal: Initial exercise prescription  builds to 30-45 minutes a day of aerobic activity, 2-3 days per week.  Home exercise guidelines will be given to patient during program as part of exercise prescription that the participant will acknowledge.  Activity Barriers & Risk Stratification:   6 Minute Walk: 6 Minute Walk    Row Name 05/02/17 1217         6 Minute Walk   Distance  1520 feet     Walk Time  6 minutes     # of Rest Breaks  0     MPH  2.87     METS  3.3     RPE  15     Perceived Dyspnea   2     VO2 Peak  11.6     Symptoms  No     Resting HR  72 bpm     Resting BP  128/64     Resting Oxygen Saturation   97 %     Exercise Oxygen Saturation  during 6 min walk  90 %     Max Ex. HR  110 bpm     Max Ex. BP  134/78     2 Minute Post BP  128/72       Interval HR   1 Minute HR  101     2 Minute HR  106     3 Minute HR  103     5 Minute HR  110     6 Minute HR  100     Interval Heart Rate?  Yes       Interval Oxygen   Interval Oxygen?  Yes     Baseline Oxygen Saturation %  97 %     1 Minute Oxygen Saturation %  91 %     1 Minute Liters of Oxygen  0 L     2 Minute Oxygen Saturation %  90 %     2 Minute Liters of Oxygen  0 L     3 Minute Oxygen Saturation %  94 %     3 Minute Liters of Oxygen  0 L     4 Minute Liters of Oxygen  0 L     5 Minute Oxygen Saturation %  93 %     5 Minute Liters of Oxygen  0 L     6 Minute Oxygen Saturation %  94 %     6 Minute Liters of Oxygen  0 L     2 Minute Post Oxygen Saturation %  97 %     2 Minute Post Liters of Oxygen  0 L       Oxygen Initial Assessment: Oxygen Initial Assessment - 05/02/17 1110      Home Oxygen   Home Oxygen Device  None    Sleep Oxygen Prescription  None    Home Exercise Oxygen Prescription  None    Home at Rest Exercise Oxygen Prescription  None      Initial 6 min Walk   Oxygen Used  None      Program Oxygen Prescription   Program Oxygen Prescription  None      Intervention   Short Term Goals  To learn and understand importance of  maintaining oxygen saturations>88%;To learn and demonstrate proper use of respiratory medications;To learn and demonstrate proper pursed lip breathing techniques or other breathing techniques.;To learn and understand importance of monitoring SPO2 with pulse oximeter and demonstrate accurate use of the pulse oximeter.    Long  Term Goals  Verbalizes importance of monitoring SPO2 with pulse oximeter and return demonstration;Maintenance of O2 saturations>88%;Compliance with respiratory medication;Demonstrates proper use of MDI's;Exhibits proper breathing techniques, such as pursed lip breathing or other method taught during program session       Oxygen Re-Evaluation: Oxygen Re-Evaluation    Row Name 05/05/17 0950 06/07/17 1036 06/30/17 1217         Program Oxygen Prescription   Program Oxygen Prescription  -  None  None       Home Oxygen   Home Oxygen Device  -  None  None     Sleep Oxygen Prescription  -  None  None     Home Exercise Oxygen Prescription  -  None  None     Home at Rest Exercise Oxygen Prescription  -  None  None       Goals/Expected Outcomes   Short Term Goals  To learn and understand importance of maintaining oxygen saturations>88%;To learn and demonstrate proper use of respiratory medications;To learn and demonstrate proper pursed lip breathing techniques or other breathing techniques.;To learn and understand importance of monitoring SPO2 with pulse oximeter and demonstrate accurate use of the pulse oximeter.  To learn and understand importance of maintaining oxygen saturations>88%;To learn and demonstrate proper use of respiratory medications;To learn and demonstrate proper pursed lip breathing techniques or other breathing techniques.;To learn and understand importance of monitoring SPO2 with pulse oximeter and demonstrate accurate use of the pulse oximeter.  To learn and understand importance of maintaining oxygen saturations>88%;To learn and demonstrate proper use of  respiratory medications;To learn and demonstrate proper pursed lip breathing techniques or other breathing techniques.;To learn and understand importance of monitoring SPO2 with pulse oximeter and demonstrate accurate use of the pulse oximeter.     Long  Term Goals  Verbalizes importance of monitoring SPO2 with pulse oximeter and return demonstration;Maintenance of O2 saturations>88%;Compliance with respiratory medication;Demonstrates proper use of MDI's;Exhibits proper breathing techniques, such as pursed lip breathing or other method taught during program session  Verbalizes importance of monitoring SPO2 with pulse oximeter and return demonstration;Maintenance of O2 saturations>88%;Compliance with respiratory medication;Demonstrates proper use of MDI's;Exhibits proper breathing techniques, such as pursed lip breathing or other method taught during program session  Verbalizes importance of monitoring SPO2 with pulse oximeter and return demonstration;Maintenance of O2 saturations>88%;Compliance with respiratory medication;Demonstrates proper use of MDI's;Exhibits proper breathing techniques, such as pursed lip breathing or other method taught during program session     Comments  Reviewed PLB technique with pt.  Talked about how it work and it's important to maintaining his exercise saturations.    Leiland checks his oxygen daily at home. He knows to keep his oxygen above 88 percent. Nebulizer is used as needed. He uses his spacer with his inhalers at home.  Rajinder  continues to check his oxygen at home. He is using his medications correctly and is and advocate for what works best for him, even when insurance wants to change it. His shortness of breath has improved, as he is working on his PLB and increasing his stamina. He understands the importance of setting limits for himself and not overdoing it.      Goals/Expected Outcomes  Short: Become more profiecient at using PLB.   Long: Become independent at using  PLB.  Short: be compliant with home medications. Long: maintain medications and checking his oxygen at home.  Short: continue to be compliant with his medications and checking his pulse ox. Long: maintain independence with PLB and minimizing SOB episodes.         Oxygen Discharge (Final Oxygen Re-Evaluation): Oxygen Re-Evaluation - 06/30/17 1217      Program Oxygen Prescription   Program Oxygen Prescription  None      Home Oxygen   Home Oxygen Device  None    Sleep Oxygen Prescription  None    Home Exercise Oxygen Prescription  None    Home at Rest Exercise Oxygen Prescription  None      Goals/Expected Outcomes   Short Term Goals  To learn and understand importance of maintaining oxygen saturations>88%;To learn and demonstrate proper use of respiratory medications;To learn and demonstrate proper pursed lip breathing techniques or other breathing techniques.;To learn and understand importance of monitoring SPO2 with pulse oximeter and demonstrate accurate use of the pulse oximeter.    Long  Term Goals  Verbalizes importance of monitoring SPO2 with pulse oximeter and return demonstration;Maintenance of O2 saturations>88%;Compliance with respiratory medication;Demonstrates proper use of MDI's;Exhibits proper breathing techniques, such as pursed lip breathing or other method taught during program session    Comments  Boden  continues to check his oxygen at home. He is using his medications correctly and is and advocate for what works best for him, even when insurance wants to change it. His shortness of breath has improved, as he is working on his PLB and increasing his stamina. He understands the importance of setting limits for himself and not overdoing it.     Goals/Expected Outcomes  Short: continue to be compliant with his medications and checking his pulse ox. Long: maintain independence with PLB and minimizing SOB episodes.        Initial Exercise Prescription: Initial Exercise  Prescription - 05/02/17 1200      Date of Initial Exercise RX and Referring Provider   Date  05/02/17    Referring Provider  Grandis      Treadmill   MPH  2.5    Grade  1    Minutes  15      Recumbant Bike   Level  4    RPM  60    Watts  40    Minutes  15    METs  3.3  Elliptical   Level  1    Speed  2.5    Minutes  15      REL-XR   Level  3    Speed  50    Minutes  15    METs  3.3      Prescription Details   Frequency (times per week)  3    Duration  Progress to 45 minutes of aerobic exercise without signs/symptoms of physical distress      Intensity   THRR 40-80% of Max Heartrate  104-136    Ratings of Perceived Exertion  11-13    Perceived Dyspnea  0-4      Resistance Training   Training Prescription  Yes    Weight  4 lb    Reps  10-15       Perform Capillary Blood Glucose checks as needed.  Exercise Prescription Changes: Exercise Prescription Changes    Row Name 05/11/17 1000 05/24/17 1100 05/24/17 1400 06/07/17 1500 06/21/17 1400     Response to Exercise   Blood Pressure (Admit)  134/60  -  120/68  114/60  110/64   Blood Pressure (Exercise)  126/70  -  -  -  -   Blood Pressure (Exit)  134/70  -  128/70  104/62  102/62   Heart Rate (Admit)  65 bpm  -  65 bpm  69 bpm  65 bpm   Heart Rate (Exercise)  95 bpm  -  96 bpm  96 bpm  87 bpm   Heart Rate (Exit)  76 bpm  -  70 bpm  73 bpm  67 bpm   Oxygen Saturation (Admit)  96 %  -  93 %  95 %  95 %   Oxygen Saturation (Exercise)  92 %  -  93 %  93 %  94 %   Oxygen Saturation (Exit)  94 %  -  94 %  95 %  92 %   Rating of Perceived Exertion (Exercise)  13  -  15  13  14    Perceived Dyspnea (Exercise)  2  -  2.5  2  2    Symptoms  none  -  none  none  none   Duration  Progress to 45 minutes of aerobic exercise without signs/symptoms of physical distress  -  Progress to 45 minutes of aerobic exercise without signs/symptoms of physical distress  Progress to 45 minutes of aerobic exercise without signs/symptoms  of physical distress  Progress to 45 minutes of aerobic exercise without signs/symptoms of physical distress   Intensity  THRR unchanged  -  THRR unchanged  THRR unchanged  THRR unchanged     Progression   Progression  Continue to progress workloads to maintain intensity without signs/symptoms of physical distress.  -  Continue to progress workloads to maintain intensity without signs/symptoms of physical distress.  Continue to progress workloads to maintain intensity without signs/symptoms of physical distress.  Continue to progress workloads to maintain intensity without signs/symptoms of physical distress.   Average METs  2.9  -  2.8  3  3.35     Resistance Training   Training Prescription  Yes  -  Yes  Yes  Yes   Weight  4 lb  -  4 lb  4 lb  4 lb   Reps  10-15  -  10-15  10-15  10-15     Interval Training   Interval Training  No  -  No  No  No     Treadmill   MPH  -  -  -  2.5  2.6   Grade  -  -  -  1  1   Minutes  -  -  -  15  15     Arm Ergometer   Level  -  -  -  -  1.4   Minutes  -  -  -  -  15   METs  -  -  -  -  2.4     REL-XR   Level  -  -  3  4  5    Speed  -  -  50  50  50   Minutes  -  -  15  15  15    METs  -  -  2.8  2.8  4.3     Home Exercise Plan   Plans to continue exercise at  -  [employer] (comment) [employer] (comment) [employer] (comment) [employer]  -   Frequency  -  Add 1 additional day to program exercise sessions.  Add 1 additional day to program exercise sessions.  Add 1 additional day to program exercise sessions.  -   Initial Home Exercises Provided  -  05/24/17  -  -  -   Row Name 07/05/17 1200             Response to Exercise   Blood Pressure (Admit)  126/72       Blood Pressure (Exit)  110/60       Heart Rate (Admit)  62 bpm       Heart Rate (Exercise)  76 bpm       Heart Rate (Exit)  63 bpm       Oxygen Saturation (Admit)  95 %       Oxygen Saturation (Exercise)  94 %       Oxygen Saturation (Exit)  93 %        Rating of Perceived Exertion (Exercise)  13       Perceived Dyspnea (Exercise)  1       Symptoms  none       Duration  Continue with 45 min of aerobic exercise without signs/symptoms of physical distress.       Intensity  Other (comment) HR rest + 20-30 due to BP meds         Progression   Progression  Continue to progress workloads to maintain intensity without signs/symptoms of physical distress.       Average METs  3         Resistance Training   Training Prescription  Yes       Weight  4 lb       Reps  10-15         Interval Training   Interval Training  No         Treadmill   MPH  2.5       Grade  1.5       Minutes  15         Arm Ergometer   Level  1.4       Minutes  15       METs  2.7          Exercise Comments: Exercise Comments    Row Name 05/05/17 0949 06/06/17 1659         Exercise Comments  First  full day of exercise!  Patient was oriented to gym and equipment including functions, settings, policies, and procedures.  Patient's individual exercise prescription and treatment plan were reviewed.  All starting workloads were established based on the results of the 6 minute walk test done at initial orientation visit.  The plan for exercise progression was also introduced and progression will be customized based on patient's performance and goals.  Nik is switching from the eliptical to the arm crank. He states that the eliptical is bothering his knees and he gets too short of breath.          Exercise Goals and Review: Exercise Goals    Row Name 05/02/17 1216             Exercise Goals   Increase Physical Activity  Yes       Intervention  Provide advice, education, support and counseling about physical activity/exercise needs.;Develop an individualized exercise prescription for aerobic and resistive training based on initial evaluation findings, risk stratification, comorbidities and participant's personal goals.       Expected Outcomes  Short Term:  Attend rehab on a regular basis to increase amount of physical activity.;Long Term: Add in home exercise to make exercise part of routine and to increase amount of physical activity.;Long Term: Exercising regularly at least 3-5 days a week.       Increase Strength and Stamina  Yes       Intervention  Provide advice, education, support and counseling about physical activity/exercise needs.;Develop an individualized exercise prescription for aerobic and resistive training based on initial evaluation findings, risk stratification, comorbidities and participant's personal goals.       Expected Outcomes  Short Term: Increase workloads from initial exercise prescription for resistance, speed, and METs.;Short Term: Perform resistance training exercises routinely during rehab and add in resistance training at home;Long Term: Improve cardiorespiratory fitness, muscular endurance and strength as measured by increased METs and functional capacity (6MWT)       Able to understand and use rate of perceived exertion (RPE) scale  Yes       Intervention  Provide education and explanation on how to use RPE scale       Expected Outcomes  Short Term: Able to use RPE daily in rehab to express subjective intensity level;Long Term:  Able to use RPE to guide intensity level when exercising independently       Able to understand and use Dyspnea scale  Yes       Intervention  Provide education and explanation on how to use Dyspnea scale       Expected Outcomes  Short Term: Able to use Dyspnea scale daily in rehab to express subjective sense of shortness of breath during exertion;Long Term: Able to use Dyspnea scale to guide intensity level when exercising independently       Knowledge and understanding of Target Heart Rate Range (THRR)  Yes       Intervention  Provide education and explanation of THRR including how the numbers were predicted and where they are located for reference       Expected Outcomes  Short Term: Able to  state/look up THRR;Long Term: Able to use THRR to govern intensity when exercising independently;Short Term: Able to use daily as guideline for intensity in rehab       Able to check pulse independently  Yes       Intervention  Provide education and demonstration on how to check pulse in carotid and radial arteries.;Review  the importance of being able to check your own pulse for safety during independent exercise       Expected Outcomes  Short Term: Able to explain why pulse checking is important during independent exercise;Long Term: Able to check pulse independently and accurately       Understanding of Exercise Prescription  Yes       Intervention  Provide education, explanation, and written materials on patient's individual exercise prescription       Expected Outcomes  Short Term: Able to explain program exercise prescription;Long Term: Able to explain home exercise prescription to exercise independently          Exercise Goals Re-Evaluation : Exercise Goals Re-Evaluation    Row Name 05/05/17 0949 05/11/17 1028 05/24/17 1136 05/24/17 1439 06/07/17 1535     Exercise Goal Re-Evaluation   Exercise Goals Review  Understanding of Exercise Prescription;Able to understand and use Dyspnea scale;Knowledge and understanding of Target Heart Rate Range (THRR);Able to understand and use rate of perceived exertion (RPE) scale  Increase Physical Activity;Increase Strength and Stamina;Able to understand and use rate of perceived exertion (RPE) scale;Able to understand and use Dyspnea scale  Increase Physical Activity;Increase Strength and Stamina  Increase Physical Activity;Able to understand and use rate of perceived exertion (RPE) scale;Increase Strength and Stamina;Able to understand and use Dyspnea scale;Knowledge and understanding of Target Heart Rate Range (THRR)  Increase Physical Activity;Able to understand and use rate of perceived exertion (RPE) scale;Able to understand and use Dyspnea scale;Increase  Strength and Stamina   Comments  Reviewed RPE scale, THR and program prescription with pt today.  Pt voiced understanding and was given a copy of goals to take home  Riese is tolerating exercise well in his first week.  He is motivated to work hard in class.  Reviewed home exercise with pt today.  Pt plans to go to the gym 1 extra day a week for exercise.  Reviewed THR, pulse, RPE, sign and symptoms, NTG use, and when to call 911 or MD.  Also discussed weather considerations and indoor options.  Pt voiced understanding.  Isac has improved to doing 12 min continuous on Elliptical after only doing 5 to start.  Staff will continue to monitor.  Kaulin is tolerating exercise well.  He is building up time on the elliptical to reach teh full 15 minutes.  He has substituted the arm crank a couple times due to knee pain   Expected Outcomes  Short: Use RPE daily to regulate intensity.  Long: Follow program prescription in THR.  Short - Pershing will continue to attend regularly  Long - Rafay will improve overall fitness   Short: add 1 extra day of exercise. Long: maintain and add more days to home exercise.  Short - Sayed will reach 15 min on elliptical Long - Serafino will improve overall fitness  Short - Tavion will continue to attend Long - Donivin will maintain exercise on his own   [place] Name 06/21/17 1452 06/30/17 1120 07/05/17 1211         Exercise Goal Re-Evaluation   Exercise Goals Review  Increase Strength and Stamina;Increase Physical Activity;Able to understand and use rate of perceived exertion (RPE) scale;Able to understand and use Dyspnea scale  Increase Strength and Stamina;Increase Physical Activity;Able to understand and use rate of perceived exertion (RPE) scale;Able to understand and use Dyspnea scale  Increase Strength and Stamina;Increase Physical Activity;Able to understand and use rate of perceived exertion (RPE) scale;Able to understand and use Dyspnea scale;Knowledge and  understanding of Target  Heart Rate Range (THRR);Understanding of Exercise Prescription;Able to check pulse independently     Comments  Riki is progressing well with exercise. Staff will continue to monitor.  Parv enjoying exercise and the progress he has made. He is planning on going on a day mission trip the end of March. He is planning on pacing himself, but he is happy to be feeling better and that his stamina has improved.   Tushar is progressing well and has increased level on XR and TM. Staff will continue to monitor     Expected Outcomes  Short - Gladstone will continue to exercise regularly  Long - Glenden will progress MET levels  Short: Ashante will continue to exercise in [place] and at home. Long: Sheena will become independent with exercise outside of LW.   Short - Mattis will attend 3 days and exercise at home Vance will maintain exercise on his own        Discharge Exercise Prescription (Final Exercise Prescription Changes): Exercise Prescription Changes - 07/05/17 1200      Response to Exercise   Blood Pressure (Admit)  126/72    Blood Pressure (Exit)  110/60    Heart Rate (Admit)  62 bpm    Heart Rate (Exercise)  76 bpm    Heart Rate (Exit)  63 bpm    Oxygen Saturation (Admit)  95 %    Oxygen Saturation (Exercise)  94 %    Oxygen Saturation (Exit)  93 %    Rating of Perceived Exertion (Exercise)  13    Perceived Dyspnea (Exercise)  1    Symptoms  none    Duration  Continue with 45 min of aerobic exercise without signs/symptoms of physical distress.    Intensity  Other (comment) HR rest + 20-30 due to BP meds      Progression   Progression  Continue to progress workloads to maintain intensity without signs/symptoms of physical distress.    Average METs  3      Resistance Training   Training Prescription  Yes    Weight  4 lb    Reps  10-15      Interval Training   Interval Training  No      Treadmill   MPH  2.5    Grade  1.5    Minutes  15      Arm Ergometer   Level  1.4     Minutes  15    METs  2.7       Nutrition:  Target Goals: Understanding of nutrition guidelines, daily intake of sodium <1519m, cholesterol <2031m calories 30% from fat and 7% or less from saturated fats, daily to have 5 or more servings of fruits and vegetables.  Biometrics: Pre Biometrics - 05/02/17 1216      Pre Biometrics   Height  5' 10"  (1.778 m)    Weight  219 lb 9.6 oz (99.6 kg)    Waist Circumference  43 inches    Hip Circumference  43 inches    Waist to Hip Ratio  1 %    BMI (Calculated)  31.51        Nutrition Therapy Plan and Nutrition Goals: Nutrition Therapy & Goals - 05/02/17 1105      Personal Nutrition Goals   Comments  He would like to lose weight and eat healthier. He would also like to meet with the dietician.      Intervention Plan   Intervention  Prescribe, educate and counsel regarding individualized specific dietary modifications aiming towards targeted core components such as weight, hypertension, lipid management, diabetes, heart failure and other comorbidities.;Nutrition handout(s) given to patient.    Expected Outcomes  Long Term Goal: Adherence to prescribed nutrition plan.;Short Term Goal: Understand basic principles of dietary content, such as calories, fat, sodium, cholesterol and nutrients.       Nutrition Assessments: Nutrition Assessments - 05/02/17 1126      MEDFICTS Scores   Pre Score  22       Nutrition Goals Re-Evaluation: Nutrition Goals Re-Evaluation    [place] Name 06/07/17 1045 06/30/17 1114           Goals   Current Weight  215 lb 9.6 oz (97.8 kg)  212 lb (96.2 kg)      Nutrition Goal  Lose weight. Stay away from sugar.  Continue making steps to reach goal weight. Manage his diet in relation to his diabetes      Comment  He has seen his diabetic dietician last month. He snacks sometimes at night and is trying not to do it. He states he needs to eat healtier carbs.  He completed his diabetes class and has met with the  dietician. He is cutting back on his nightly snacking, and when he does he is trying to eat healthier options (like carrots)      Expected Outcome  Short: eat dinner earlier, snack less before bed. Long: maintain eating dinner earlier and eliminate eating snacks before bed.  Short: continue cutting back on nightly snacks and replace the snacks he does have with healthier options. Long: maintain a diabetic and heart healthy diet.          Nutrition Goals Discharge (Final Nutrition Goals Re-Evaluation): Nutrition Goals Re-Evaluation - 06/30/17 1114      Goals   Current Weight  212 lb (96.2 kg)    Nutrition Goal  Continue making steps to reach goal weight. Manage his diet in relation to his diabetes    Comment  He completed his diabetes class and has met with the dietician. He is cutting back on his nightly snacking, and when he does he is trying to eat healthier options (like carrots)    Expected Outcome  Short: continue cutting back on nightly snacks and replace the snacks he does have with healthier options. Long: maintain a diabetic and heart healthy diet.        Psychosocial: Target Goals: Acknowledge presence or absence of significant depression and/or stress, maximize coping skills, provide positive support system. Participant is able to verbalize types and ability to use techniques and skills needed for reducing stress and depression.   Initial Review & Psychosocial Screening: Initial Psych Review & Screening - 05/02/17 1104      Initial Review   Current issues with  None Identified      Family Dynamics   Good Support System?  Yes    Comments  His family is a great support system      Barriers   Psychosocial barriers to participate in program  The patient should benefit from training in stress management and relaxation.      Screening Interventions   Interventions  Encouraged to exercise       Quality of Life Scores:  Scores of 19 and below usually indicate a poorer  quality of life in these areas.  A difference of  2-3 points is a clinically meaningful difference.  A difference of 2-3 points in the  total score of the Quality of Life Index has been associated with significant improvement in overall quality of life, self-image, physical symptoms, and general health in studies assessing change in quality of life.  PHQ-9: Recent Review Flowsheet Data    Depression screen [facility] 2/9 06/19/2017 06/12/2017 05/15/2017 05/02/2017 07/11/2016   Decreased Interest 0 0 0 0 0   Down, Depressed, Hopeless 0 0 0 0 0   PHQ - 2 Score 0 0 0 0 0   Altered sleeping 1 0 - 1 0   Tired, decreased energy 1 0 - 1 0   Change in appetite - 0 - 0 0   Feeling bad or failure about yourself  - 0 - 0 0   Trouble concentrating - 0 - 0 0   Moving slowly or fidgety/restless - 0 - 0 0   Suicidal thoughts - 0 - 0 0   PHQ-9 Score 2 0 - 2 0   Difficult doing work/chores Not difficult at all Not difficult at all - Not difficult at all Not difficult at all     Interpretation of Total Score  Total Score Depression Severity:  1-4 = Minimal depression, 5-9 = Mild depression, 10-14 = Moderate depression, 15-19 = Moderately severe depression, 20-27 = Severe depression   Psychosocial Evaluation and Intervention: Psychosocial Evaluation - 05/29/17 1042      Psychosocial Evaluation & Interventions   Interventions  Encouraged to exercise with the program and follow exercise prescription    Comments  Mr. Verga Tiley)  has returned to this Pulmonary Rehab program due to exposure to Mold in the fall causing a severe reaction in his pulmonary status.  He is a 67 year old who has a strong support system with a spouse of 61 years and very active in his local church.  He has multiple health issues on top of the Pulmonary concerns; including recent diagnosis of diabetes; open heart surgery 12 years ago and he has a current Aortic Abdominal Aneurysm (AAA) which is being monitored by a doctor for potential surgery  in the near future.  He sleeps well and has a good appetite.  Brenden denies a history of depression or anxiety or any current symptoms and other than his health, he reports no stress in his life.  He has goals to breathe better and get back to normal activities again.  Staff will follow.    Expected Outcomes  Daune will benefit from consistent exercise to achieve his stated goals.  The educational and psychoeducational components of this program will be helpful in understanding and managing his health in a more positive way.  Staff will follow..    Continue Psychosocial Services   Follow up required by staff       Psychosocial Re-Evaluation: Psychosocial Re-Evaluation    [place] Name 06/07/17 1049 06/28/17 1127           Psychosocial Re-Evaluation   Current issues with  None Identified  None Identified      Comments  Berneta Sages shoots for a hobby and does church work. He has no stress concerns. He knows his COPD well and knows what to do to take care of himself. He is enjoying the program and he is happy to be losing weight.  Counselor follow up with Berneta Sages today reporting he is feeling stronger and returning to his more normal enjoyable activities since coming into this program.  He reports continuing to sleep well and is accomplishing his goal to lose some weight while  in this program.  Counselor commended Dayshon on his progress so far and commitment to exercise consistently.        Expected Outcomes  Short: continue to attened [facility]. Long: maintain exercise program to keep stress to a minimum.  LT goal:  To continue exercising to enjoy positive quality of life.  ST - to continue practicing pacing himself in order to have realistic goals in scheduling activities.      Interventions  Encouraged to attend Pulmonary Rehabilitation for the exercise  Stress management education      Continue Psychosocial Services   -  Follow up required by staff         Psychosocial Discharge (Final Psychosocial  Re-Evaluation): Psychosocial Re-Evaluation - 06/28/17 1127      Psychosocial Re-Evaluation   Current issues with  None Identified    Comments  Counselor follow up with Berneta Sages today reporting he is feeling stronger and returning to his more normal enjoyable activities since coming into this program.  He reports continuing to sleep well and is accomplishing his goal to lose some weight while in this program.  Counselor commended [place] on his progress so far and commitment to exercise consistently.      Expected Outcomes  LT goal:  To continue exercising to enjoy positive quality of life.  ST - to continue practicing pacing himself in order to have realistic goals in scheduling activities.    Interventions  Stress management education    Continue Psychosocial Services   Follow up required by staff       Education: Education Goals: Education classes will be provided on a weekly basis, covering required topics. Participant will state understanding/return demonstration of topics presented.  Learning Barriers/Preferences: Learning Barriers/Preferences - 05/02/17 1237      Learning Barriers/Preferences   Learning Barriers  None    Learning Preferences  None       Education Topics:  Initial Evaluation Education: - Verbal, written and demonstration of respiratory meds, oximetry and breathing techniques. Instruction on use of nebulizers and MDIs and importance of monitoring MDI activations.   Pulmonary Rehab from 07/10/2017 in [facility] Cardiac and Pulmonary Rehab  Date  05/02/17  Educator  [facility]  Instruction Review Code  1- Verbalizes Understanding      General Nutrition Guidelines/Fats and Fiber: -Group instruction provided by verbal, written material, models and posters to present the general guidelines for heart healthy nutrition. Gives an explanation and review of dietary fats and fiber.   Pulmonary Rehab from 07/10/2017 in [facility] Cardiac and Pulmonary Rehab  Date  07/03/17  Educator  CR   Instruction Review Code  5- Refused Teaching      Controlling Sodium/Reading Food Labels: -Group verbal and written material supporting the discussion of sodium use in heart healthy nutrition. Review and explanation with models, verbal and written materials for utilization of the food label.   Pulmonary Rehab from 07/10/2017 in [facility] Cardiac and Pulmonary Rehab  Date  07/10/17  Educator  CR  Instruction Review Code  1- Verbalizes Understanding      Exercise Physiology & General Exercise Guidelines: - Group verbal and written instruction with models to review the exercise physiology of the cardiovascular system and associated critical values. Provides general exercise guidelines with specific guidelines to those with heart or lung disease.    Pulmonary Rehab from 07/15/2016 in [facility] Cardiac and Pulmonary Rehab  Date  07/15/16  Educator  Nada Maclachlan, EP  Instruction Review Code (retired)  2- meets goals/outcomes  Aerobic Exercise & Resistance Training: - Gives group verbal and written instruction on the various components of exercise. Focuses on aerobic and resistive training programs and the benefits of this training and how to safely progress through these programs.   Pulmonary Rehab from 07/15/2016 in [facility] Cardiac and Pulmonary Rehab  Date  05/11/16  Educator  [facility]  Instruction Review Code (retired)  2- Statistician, Balance, Mind/Body Relaxation: Provides group verbal/written instruction on the benefits of flexibility and balance training, including mind/body exercise modes such as yoga, pilates and tai chi.  Demonstration and skill practice provided.   Pulmonary Rehab from 07/10/2017 in [facility] Cardiac and Pulmonary Rehab  Date  05/24/17  Educator  AS  Instruction Review Code  1- Verbalizes Understanding      Stress and Anxiety: - Provides group verbal and written instruction about the health risks of elevated stress and causes of high stress.  Discuss  the correlation between heart/lung disease and anxiety and treatment options. Review healthy ways to manage with stress and anxiety.   Pulmonary Rehab from 07/10/2017 in [facility] Cardiac and Pulmonary Rehab  Date  06/14/17  Educator  [facility]  Instruction Review Code  1- Verbalizes Understanding      Depression: - Provides group verbal and written instruction on the correlation between heart/lung disease and depressed mood, treatment options, and the stigmas associated with seeking treatment.   Pulmonary Rehab from 07/15/2016 in [facility] Cardiac and Pulmonary Rehab  Date  06/15/16  Educator  [facility]  Instruction Review Code (retired)  2- meets goals/outcomes      Exercise & Equipment Safety: - Individual verbal instruction and demonstration of equipment use and safety with use of the equipment.   Pulmonary Rehab from 07/10/2017 in [facility] Cardiac and Pulmonary Rehab  Date  05/02/17  Educator  [facility]  Instruction Review Code  1- Verbalizes Understanding      Infection Prevention: - Provides verbal and written material to individual with discussion of infection control including proper hand washing and proper equipment cleaning during exercise session.   Pulmonary Rehab from 07/10/2017 in [facility] Cardiac and Pulmonary Rehab  Date  05/02/17  Educator  [facility]  Instruction Review Code  1- Verbalizes Understanding      Falls Prevention: - Provides verbal and written material to individual with discussion of falls prevention and safety.   Pulmonary Rehab from 07/10/2017 in [facility] Cardiac and Pulmonary Rehab  Date  05/02/17  Educator  [facility]  Instruction Review Code  1- Verbalizes Understanding      Diabetes: - Individual verbal and written instruction to review signs/symptoms of diabetes, desired ranges of glucose level fasting, after meals and with exercise. Advice that pre and post exercise glucose checks will be done for 3 sessions at entry of program.   Chronic Lung Diseases: - Group verbal and written instruction to  review updates, respiratory medications, advancements in procedures and treatments. Discuss use of supplemental oxygen including available portable oxygen systems, continuous and intermittent flow rates, concentrators, personal use and safety guidelines. Review proper use of inhaler and spacers. Provide informative websites for self-education.    Pulmonary Rehab from 07/10/2017 in [facility] Cardiac and Pulmonary Rehab  Date  07/05/17  Educator  [facility]  Instruction Review Code  1- Verbalizes Understanding      Energy Conservation: - Provide group verbal and written instruction for methods to conserve energy, plan and organize activities. Instruct on pacing techniques, use of adaptive equipment and posture/positioning to relieve shortness of  breath.   Pulmonary Rehab from 07/10/2017 in [facility] Cardiac and Pulmonary Rehab  Date  06/07/17  Educator  [facility]  Instruction Review Code  1- Verbalizes Understanding      Triggers and Exacerbations: - Group verbal and written instruction to review types of environmental triggers and ways to prevent exacerbations. Discuss weather changes, air quality and the benefits of nasal washing. Review warning signs and symptoms to help prevent infections. Discuss techniques for effective airway clearance, coughing, and vibrations.   Pulmonary Rehab from 07/15/2016 in [facility] Cardiac and Pulmonary Rehab  Date  06/01/16  Educator  [facility]  Instruction Review Code (retired)  2- meets goals/outcomes      AED/CPR: - Group verbal and written instruction with the use of models to demonstrate the basic use of the AED with the basic ABC's of resuscitation.   Pulmonary Rehab from 07/10/2017 in [facility] Cardiac and Pulmonary Rehab  Date  06/09/17  Educator  [facility]  Instruction Review Code  1- Actuary and Physiology of the Lungs: - Group verbal and written instruction with the use of models to provide basic lung anatomy and physiology related to function, structure and  complications of lung disease.   Pulmonary Rehab from 07/15/2016 in [facility] Cardiac and Pulmonary Rehab  Date  07/01/16  Educator  LB  Instruction Review Code (retired)  2- meets Designer, fashion/clothing & Physiology of the Heart: - Group verbal and written instruction and models provide basic cardiac anatomy and physiology, with the coronary electrical and arterial systems. Review of Valvular disease and Heart Failure   Pulmonary Rehab from 07/10/2017 in [facility] Cardiac and Pulmonary Rehab  Date  05/10/17  Educator  [facility]  Instruction Review Code  1- Verbalizes Understanding      Cardiac Medications: - Group verbal and written instruction to review commonly prescribed medications for heart disease. Reviews the medication, class of the drug, and side effects.   Pulmonary Rehab from 07/10/2017 in [facility] Cardiac and Pulmonary Rehab  Date  05/26/17  Educator  [facility]  Instruction Review Code  5- Refused Teaching      Know Your Numbers and Risk Factors: -Group verbal and written instruction about important numbers in your health.  Discussion of what are risk factors and how they play a role in the disease process.  Review of Cholesterol, Blood Pressure, Diabetes, and BMI and the role they play in your overall health.   Sleep Hygiene: -Provides group verbal and written instruction about how sleep can affect your health.  Define sleep hygiene, discuss sleep cycles and impact of sleep habits. Review good sleep hygiene tips.    Other: -Provides group and verbal instruction on various topics (see comments)    Knowledge Questionnaire Score: Knowledge Questionnaire Score - 05/02/17 1107      Knowledge Questionnaire Score   Pre Score  14/18 Reviewed with patient        Core Components/Risk Factors/Patient Goals at Admission: Personal Goals and Risk Factors at Admission - 05/02/17 1111      Core Components/Risk Factors/Patient Goals on Admission    Weight Management  Yes;Weight Loss     Intervention  Weight Management: Develop a combined nutrition and exercise program designed to reach desired caloric intake, while maintaining appropriate intake of nutrient and fiber, sodium and fats, and appropriate energy expenditure required for the weight goal.;Weight Management: Provide education and appropriate resources to help participant work on and attain dietary goals.;Obesity: Provide education and  appropriate resources to help participant work on and attain dietary goals.    Admit Weight  219 lb 4.8 oz (99.5 kg)    Goal Weight: Short Term  214 lb (97.1 kg)    Goal Weight: Long Term  180 lb (81.6 kg)    Expected Outcomes  Short Term: Continue to assess and modify interventions until short term weight is achieved;Long Term: Adherence to nutrition and physical activity/exercise program aimed toward attainment of established weight goal;Weight Loss: Understanding of general recommendations for a balanced deficit meal plan, which promotes 1-2 lb weight loss per week and includes a negative energy balance of [phone] kcal/d;Understanding recommendations for meals to include 15-35% energy as protein, 25-35% energy from fat, 35-60% energy from carbohydrates, less than 252m of dietary cholesterol, 20-35 gm of total fiber daily;Understanding of distribution of calorie intake throughout the day with the consumption of 4-5 meals/snacks    Improve shortness of breath with ADL's  Yes    Intervention  Provide education, individualized exercise plan and daily activity instruction to help decrease symptoms of SOB with activities of daily living.    Expected Outcomes  Short Term: Improve cardiorespiratory fitness to achieve a reduction of symptoms when performing ADLs;Long Term: Be able to perform more ADLs without symptoms or delay the onset of symptoms    Diabetes  Yes pre diabetic    Intervention  Provide education about signs/symptoms and action to take for hypo/hyperglycemia.;Provide education about  proper nutrition, including hydration, and aerobic/resistive exercise prescription along with prescribed medications to achieve blood glucose in normal ranges: Fasting glucose 65-99 mg/dL    Expected Outcomes  Short Term: Participant verbalizes understanding of the signs/symptoms and immediate care of hyper/hypoglycemia, proper foot care and importance of medication, aerobic/resistive exercise and nutrition plan for blood glucose control.;Long Term: Attainment of HbA1C < 7%.    Hypertension  Yes    Intervention  Provide education on lifestyle modifcations including regular physical activity/exercise, weight management, moderate sodium restriction and increased consumption of fresh fruit, vegetables, and low fat dairy, alcohol moderation, and smoking cessation.;Monitor prescription use compliance.    Expected Outcomes  Short Term: Continued assessment and intervention until BP is < 140/928mHG in hypertensive participants. < 130/8048mG in hypertensive participants with diabetes, heart failure or chronic kidney disease.;Long Term: Maintenance of blood pressure at goal levels.    Lipids  Yes    Intervention  Provide education and support for participant on nutrition & aerobic/resistive exercise along with prescribed medications to achieve LDL <88m51mDL >40mg40m Expected Outcomes  Short Term: Participant states understanding of desired cholesterol values and is compliant with medications prescribed. Participant is following exercise prescription and nutrition guidelines.;Long Term: Cholesterol controlled with medications as prescribed, with individualized exercise RX and with personalized nutrition plan. Value goals: LDL < 88mg,61m > 40 mg.       Core Components/Risk Factors/Patient Goals Review:  Goals and Risk Factor Review    [place] Name 06/07/17 1040 06/30/17 1110           Core Components/Risk Factors/Patient Goals Review   Personal Goals Review  Weight Management/Obesity;Improve shortness of  breath with ADL's;Lipids;Hypertension;Diabetes  Weight Management/Obesity;Hypertension;Diabetes;Lipids      Review  GeraldKyllianonme down about 5 pounds since the start of the program. Blood pressure has been under control. He does not mow the yard but is not limited to home activities asd chores. He takes his time and paces himself.   GeraldRidgelyighing in at  212. He went up a little recently, but is happy to be back down. He met with the Dietician and is incopoorating her suggestions to his diet. This has helped him cut down on his nightly snacking, which in turn is helping with his CBG reading. He also attended a three week class about diabetes which he reported helped his knowledge about this health issue tremendously.  He is compliant with his HTN and HLD medications. He does receive a call from his insurance nurse regarding his medications twice a year, and he suggested Nichalos requesting to be switched off of his atorvastatin since he is experiencing cramps at night.       Expected Outcomes  Short: continue attending [facility] and manage COPD. Long: maintain exercise routine post [facility] for Diabetes and breathing.  Short: converse with his doctor about his statin drug to see if there are other alternatives when he goes to his app 4/16, continue to attend [facility] for exercise and education. Long: Manage his diabetes successfuly and reach his weight goal.          Core Components/Risk Factors/Patient Goals at Discharge (Final Review):  Goals and Risk Factor Review - 06/30/17 1110      Core Components/Risk Factors/Patient Goals Review   Personal Goals Review  Weight Management/Obesity;Hypertension;Diabetes;Lipids    Review  Trevaughn is weighing in at 212. He went up a little recently, but is happy to be back down. He met with the Dietician and is incopoorating her suggestions to his diet. This has helped him cut down on his nightly snacking, which in turn is helping with his CBG reading. He also  attended a three week class about diabetes which he reported helped his knowledge about this health issue tremendously.  He is compliant with his HTN and HLD medications. He does receive a call from his insurance nurse regarding his medications twice a year, and he suggested [facility] requesting to be switched off of his atorvastatin since he is experiencing cramps at night.     Expected Outcomes  Short: converse with his doctor about his statin drug to see if there are other alternatives when he goes to his app 4/16, continue to attend [facility] for exercise and education. Long: Manage his diabetes successfuly and reach his weight goal.        ITP Comments: ITP Comments    Row Name 05/02/17 1043 05/02/17 1044 05/22/17 0906 06/19/17 0817 07/17/17 0822   ITP Comments  -  Medical Evaluation completed. Chart sent for review and changes to Dr. Emily Filbert Director of [place]. Diagnosis can be found in [facility] encounter 05/02/16  30 day review completed. ITP sent to Dr. Emily Filbert Director of [place]. Continue with ITP unless changes are made by physician.  30 day review completed. ITP sent to Dr. Emily Filbert Director of [place]. Continue with ITP unless changes are made by physician.   30 day review completed. ITP sent to Dr. Emily Filbert Director of [place]. Continue with ITP unless changes are made by physician      Comments: 30 day review

## 2017-07-19 DIAGNOSIS — J441 Chronic obstructive pulmonary disease with (acute) exacerbation: Secondary | ICD-10-CM | POA: Diagnosis not present

## 2017-07-19 DIAGNOSIS — J449 Chronic obstructive pulmonary disease, unspecified: Secondary | ICD-10-CM

## 2017-07-19 NOTE — Progress Notes (Signed)
Daily Session Note  Patient Details  Name: Melvin Brewer MRN: [id number] Date of Birth: Aug 16, 1950 Referring Provider:     Pulmonary Rehab from 05/02/2017 in [facility] Cardiac and Pulmonary Rehab  Referring Provider  [place]      Encounter Date: 07/19/2017  Check In: Session Check In - 07/19/17 1023      Check-In   Location  [facility]-Cardiac & Pulmonary Rehab    Staff Present  Alberteen Sam, MA, [place], CCRP, Exercise Physiologist;Amanda Oletta Darter, [place], ACSM CEP, Exercise Physiologist;Joseph Flavia Shipper    Supervising physician immediately available to respond to emergencies  [facility] immediately available ER MD    Physician(s)  Jimmye Norman and Clearnce Hasten    Medication changes reported      No    Fall or balance concerns reported     No    Warm-up and Cool-down  Performed as group-led Higher education careers adviser Performed  Yes    VAD Patient?  No      Pain Assessment   Currently in Pain?  No/denies    Multiple Pain Sites  No          Social History   Tobacco Use  Smoking Status Former Smoker  . Packs/day: 2.00  . Years: 43.00  . Pack years: 86.00  . Types: Cigarettes  . Last attempt to quit: 04/11/2004  . Years since quitting: 13.2  Smokeless Tobacco Never Used    Goals Met:  Independence with exercise equipment Exercise tolerated well Strength training completed today  Goals Unmet:  Not Applicable  Comments: Pt able to follow exercise prescription today without complaint.  Will continue to monitor for progression.    Dr. Emily Filbert is Medical Director for [place] and [facility].

## 2017-07-21 DIAGNOSIS — J441 Chronic obstructive pulmonary disease with (acute) exacerbation: Secondary | ICD-10-CM | POA: Diagnosis not present

## 2017-07-21 DIAGNOSIS — J449 Chronic obstructive pulmonary disease, unspecified: Secondary | ICD-10-CM

## 2017-07-21 NOTE — Progress Notes (Signed)
Daily Session Note  Patient Details  Name: Melvin Brewer MRN: [id number] Date of Birth: 22-Mar-1951 Referring Provider:     Pulmonary Rehab from 05/02/2017 in [facility] Cardiac and Pulmonary Rehab  Referring Provider  [place]      Encounter Date: 07/21/2017  Check In: Session Check In - 07/21/17 0942      Check-In   Location  [facility]-Cardiac & Pulmonary Rehab    Staff Present  Justin Mend Lorre Nick, [place], RCEP, CCRP, Exercise Physiologist;Meredith Sherryll Burger, RN BSN    Supervising physician immediately available to respond to emergencies  [facility] immediately available ER MD    Physician(s)  Dr. Alfred Levins and Pasduchowski    Medication changes reported      No    Fall or balance concerns reported     No    Tobacco Cessation  No Change    Warm-up and Cool-down  Performed as group-led instruction    Resistance Training Performed  Yes    VAD Patient?  No      Pain Assessment   Currently in Pain?  No/denies          Social History   Tobacco Use  Smoking Status Former Smoker  . Packs/day: 2.00  . Years: 43.00  . Pack years: 86.00  . Types: Cigarettes  . Last attempt to quit: 04/11/2004  . Years since quitting: 13.2  Smokeless Tobacco Never Used    Goals Met:  Independence with exercise equipment Exercise tolerated well No report of cardiac concerns or symptoms Strength training completed today  Goals Unmet:  Not Applicable  Comments: Pt able to follow exercise prescription today without complaint.  Will continue to monitor for progression.   Dr. Emily Filbert is Medical Director for [place] and [facility].

## 2017-07-24 DIAGNOSIS — J441 Chronic obstructive pulmonary disease with (acute) exacerbation: Secondary | ICD-10-CM | POA: Diagnosis not present

## 2017-07-24 DIAGNOSIS — J449 Chronic obstructive pulmonary disease, unspecified: Secondary | ICD-10-CM

## 2017-07-24 NOTE — Progress Notes (Signed)
Daily Session Note  Patient Details  Name: Melvin Brewer MRN: [id number] Date of Birth: 06/21/1950 Referring Provider:     Pulmonary Rehab from 05/02/2017 in [facility] Cardiac and Pulmonary Rehab  Referring Provider  [place]      Encounter Date: 07/24/2017  Check In: Session Check In - 07/24/17 0940      Check-In   Location  [facility]-Cardiac & Pulmonary Rehab    Staff Present  Nada Maclachlan, BA, ACSM CEP, Exercise Physiologist;Kelly Amedeo Plenty, BS, ACSM CEP, Exercise Physiologist;Joseph Flavia Shipper    Supervising physician immediately available to respond to emergencies  [facility] immediately available ER MD    Physician(s)  Dr. Burlene Arnt and Joni Fears    Medication changes reported      No    Fall or balance concerns reported     No    Tobacco Cessation  No Change    Warm-up and Cool-down  Performed as group-led instruction    Resistance Training Performed  Yes    VAD Patient?  No      Pain Assessment   Currently in Pain?  No/denies          Social History   Tobacco Use  Smoking Status Former Smoker  . Packs/day: 2.00  . Years: 43.00  . Pack years: 86.00  . Types: Cigarettes  . Last attempt to quit: 04/11/2004  . Years since quitting: 13.2  Smokeless Tobacco Never Used    Goals Met:  Independence with exercise equipment Exercise tolerated well No report of cardiac concerns or symptoms Strength training completed today  Goals Unmet:  Not Applicable  Comments: Pt able to follow exercise prescription today without complaint.  Will continue to monitor for progression.   Dr. Emily Filbert is Medical Director for [place] and [facility].

## 2017-07-26 DIAGNOSIS — J441 Chronic obstructive pulmonary disease with (acute) exacerbation: Secondary | ICD-10-CM | POA: Diagnosis not present

## 2017-07-26 DIAGNOSIS — J449 Chronic obstructive pulmonary disease, unspecified: Secondary | ICD-10-CM

## 2017-07-26 NOTE — Progress Notes (Signed)
Daily Session Note  Patient Details  Name: Melvin Brewer MRN: [id number] Date of Birth: Dec 04, 1950 Referring Provider:     Pulmonary Rehab from 05/02/2017 in [facility] Cardiac and Pulmonary Rehab  Referring Provider  [place]      Encounter Date: 07/26/2017  Check In: Session Check In - 07/26/17 0942      Check-In   Location  [facility]-Cardiac & Pulmonary Rehab    Staff Present  Justin Mend RCP,RRT,BSRT;Amanda Oletta Darter, BA, ACSM CEP, Exercise Physiologist;Jessica Luan Pulling, MA, RCEP, CCRP, Exercise Physiologist    Supervising physician immediately available to respond to emergencies  [facility] immediately available ER MD    Physician(s)  Dr. Owens Shark and Jimmye Norman    Medication changes reported      No    Fall or balance concerns reported     No    Tobacco Cessation  No Change    Warm-up and Cool-down  Performed as group-led instruction    Resistance Training Performed  Yes    VAD Patient?  No      Pain Assessment   Currently in Pain?  No/denies          Social History   Tobacco Use  Smoking Status Former Smoker  . Packs/day: 2.00  . Years: 43.00  . Pack years: 86.00  . Types: Cigarettes  . Last attempt to quit: 04/11/2004  . Years since quitting: 13.2  Smokeless Tobacco Never Used    Goals Met:  Independence with exercise equipment Exercise tolerated well No report of cardiac concerns or symptoms Strength training completed today  Goals Unmet:  Not Applicable  Comments: Pt able to follow exercise prescription today without complaint.  Will continue to monitor for progression.   Dr. Emily Filbert is Medical Director for [place] and [facility].

## 2017-07-28 ENCOUNTER — Telehealth: Payer: Self-pay | Admitting: *Deleted

## 2017-07-28 ENCOUNTER — Encounter: Payer: Self-pay | Admitting: *Deleted

## 2017-07-28 DIAGNOSIS — J449 Chronic obstructive pulmonary disease, unspecified: Secondary | ICD-10-CM

## 2017-07-28 NOTE — Telephone Encounter (Signed)
Melvin Brewer called to let us know that he would be out today.  He is on day two of a bad stomach bug and hopes to return on Monday.

## 2017-07-31 DIAGNOSIS — J441 Chronic obstructive pulmonary disease with (acute) exacerbation: Secondary | ICD-10-CM | POA: Diagnosis not present

## 2017-07-31 DIAGNOSIS — J449 Chronic obstructive pulmonary disease, unspecified: Secondary | ICD-10-CM

## 2017-07-31 NOTE — Progress Notes (Signed)
Daily Session Note  Patient Details  Name: Melvin Brewer MRN: [id number] Date of Birth: 07-12-1950 Referring Provider:     Pulmonary Rehab from 05/02/2017 in [facility] Cardiac and Pulmonary Rehab  Referring Provider  [facility]      Encounter Date: 07/31/2017  Check In: Session Check In - 07/31/17 0945      Check-In   Location  [facility]-Cardiac & Pulmonary Rehab    Staff Present  Earlean Shawl, BS, ACSM CEP, Exercise Physiologist;Mandi Zachery Conch, BS, [facility]    Supervising physician immediately available to respond to emergencies  [facility] immediately available ER MD    Physician(s)  Dr. Reita Cliche and Cinda Quest    Medication changes reported      No    Fall or balance concerns reported     No    Tobacco Cessation  No Change    Warm-up and Cool-down  Performed as group-led instruction    Resistance Training Performed  Yes    VAD Patient?  No      Pain Assessment   Currently in Pain?  No/denies          Social History   Tobacco Use  Smoking Status Former Smoker  . Packs/day: 2.00  . Years: 43.00  . Pack years: 86.00  . Types: Cigarettes  . Last attempt to quit: 04/11/2004  . Years since quitting: 13.3  Smokeless Tobacco Never Used    Goals Met:  Independence with exercise equipment Exercise tolerated well No report of cardiac concerns or symptoms Strength training completed today  Goals Unmet:  Not Applicable  Comments: Pt able to follow exercise prescription today without complaint.  Will continue to monitor for progression.   Dr. Emily Filbert is Medical Director for [place] and [facility].

## 2017-08-02 VITALS — Ht 70.0 in | Wt 211.4 lb

## 2017-08-02 DIAGNOSIS — J449 Chronic obstructive pulmonary disease, unspecified: Secondary | ICD-10-CM

## 2017-08-02 DIAGNOSIS — J441 Chronic obstructive pulmonary disease with (acute) exacerbation: Secondary | ICD-10-CM | POA: Diagnosis not present

## 2017-08-02 NOTE — Progress Notes (Signed)
Daily Session Note  Patient Details  Name: Melvin Brewer MRN: [id number] Date of Birth: 1951/01/15 Referring Provider:     Pulmonary Rehab from 05/02/2017 in [facility] Cardiac and Pulmonary Rehab  Referring Provider  [place]      Encounter Date: 08/02/2017  Check In: Session Check In - 08/02/17 1050      Check-In   Location  [facility]-Cardiac & Pulmonary Rehab    Staff Present  Alberteen Sam, MA, RCEP, CCRP, Exercise Physiologist;Amanda Oletta Darter, [place], ACSM CEP, Exercise Physiologist;Phillips Goulette Flavia Shipper    Supervising physician immediately available to respond to emergencies  [facility] immediately available ER MD    Physician(s)  Dr. Archie Balboa and Joni Fears    Medication changes reported      No    Fall or balance concerns reported     No    Warm-up and Cool-down  Performed as group-led instruction    Resistance Training Performed  Yes    VAD Patient?  No      Pain Assessment   Currently in Pain?  No/denies          Social History   Tobacco Use  Smoking Status Former Smoker  . Packs/day: 2.00  . Years: 43.00  . Pack years: 86.00  . Types: Cigarettes  . Last attempt to quit: 04/11/2004  . Years since quitting: 13.3  Smokeless Tobacco Never Used    Goals Met:  Proper associated with RPD/PD & O2 Sat Independence with exercise equipment Using PLB without cueing & demonstrates good technique Exercise tolerated well No report of cardiac concerns or symptoms Strength training completed today  Goals Unmet:  Not Applicable  Comments: Pt able to follow exercise prescription today without complaint.  Will continue to monitor for progression. [place] Name 05/02/17 1217 08/02/17 1044       6 Minute Walk   Phase  -  Discharge    Distance  1520 feet  1535 feet    Distance % Change  -  0.9 %    Distance Feet Change  -  15 ft    Walk Time  6 minutes  5.77 minutes    # of Rest Breaks  0  1 14 sec    MPH  2.87  3.02    METS  3.3  3.58    RPE  15  15    Perceived Dyspnea   2  3    VO2 Peak  11.6  12.52    Symptoms  No  Yes (comment)    Comments  -  SOB    Resting HR  72 bpm  62 bpm    Resting BP  128/64  124/64    Resting Oxygen Saturation   97 %  95 %    Exercise Oxygen Saturation  during 6 min walk  90 %  89 %    Max Ex. HR  110 bpm  109 bpm    Max Ex. BP  134/78  156/86    2 Minute Post BP  128/72  154/60      Interval HR   1 Minute HR  101  -    2 Minute HR  106  -    3 Minute HR  103  -    4 Minute HR  -  104 rest break [id number]    5 Minute HR  110  109    6 Minute HR  100  104    2 Minute  Post HR  -  81    Interval Heart Rate?  Yes  Yes      Interval Oxygen   Interval Oxygen?  Yes  -    Baseline Oxygen Saturation %  97 %  95 %    1 Minute Oxygen Saturation %  91 %  94 %    1 Minute Liters of Oxygen  0 L  0 L Room Air    2 Minute Oxygen Saturation %  90 %  93 %    2 Minute Liters of Oxygen  0 L  0 L    3 Minute Oxygen Saturation %  94 %  92 %    3 Minute Liters of Oxygen  0 L  0 L    4 Minute Oxygen Saturation %  -  91 %    4 Minute Liters of Oxygen  0 L  0 L    5 Minute Oxygen Saturation %  93 %  89 %    5 Minute Liters of Oxygen  0 L  0 L    6 Minute Oxygen Saturation %  94 %  91 %    6 Minute Liters of Oxygen  0 L  0 L    2 Minute Post Oxygen Saturation %  97 %  94 %    2 Minute Post Liters of Oxygen  0 L  0 L         Dr. Emily Filbert is Medical Director for [place] and [facility].

## 2017-08-07 DIAGNOSIS — J449 Chronic obstructive pulmonary disease, unspecified: Secondary | ICD-10-CM

## 2017-08-07 DIAGNOSIS — J441 Chronic obstructive pulmonary disease with (acute) exacerbation: Secondary | ICD-10-CM | POA: Diagnosis not present

## 2017-08-07 NOTE — Progress Notes (Signed)
Daily Session Note  Patient Details  Name: Melvin Brewer MRN: [id number] Date of Birth: 10-Aug-1950 Referring Provider:     Pulmonary Rehab from 05/02/2017 in [facility] Cardiac and Pulmonary Rehab  Referring Provider  [place]      Encounter Date: 08/07/2017  Check In: Session Check In - 08/07/17 0939      Check-In   Location  [facility]-Cardiac & Pulmonary Rehab    Staff Present  Justin Mend Jaci Carrel, BS, ACSM CEP, Exercise Physiologist;Amanda Oletta Darter, [place], ACSM CEP, Exercise Physiologist    Supervising physician immediately available to respond to emergencies  [facility] immediately available ER MD    Physician(s)  Dr. Alfred Levins and Corky Downs    Medication changes reported      No    Fall or balance concerns reported     No    Tobacco Cessation  No Change    Warm-up and Cool-down  Performed as group-led instruction    Resistance Training Performed  Yes    VAD Patient?  No      Pain Assessment   Currently in Pain?  No/denies          Social History   Tobacco Use  Smoking Status Former Smoker  . Packs/day: 2.00  . Years: 43.00  . Pack years: 86.00  . Types: Cigarettes  . Last attempt to quit: 04/11/2004  . Years since quitting: 13.3  Smokeless Tobacco Never Used    Goals Met:  Independence with exercise equipment Exercise tolerated well No report of cardiac concerns or symptoms Strength training completed today  Goals Unmet:  Not Applicable  Comments: Pt able to follow exercise prescription today without complaint.  Will continue to monitor for progression. [place] Name 05/02/17 1217 08/02/17 1044 08/07/17 1149     6 Minute Walk   Phase  -  Discharge  Discharge   Distance  1520 feet  1535 feet  1600 feet   Distance % Change  -  0.9 %  5.3 %   Distance Feet Change  -  15 ft  80 ft   Walk Time  6 minutes  5.77 minutes  6 minutes   # of Rest Breaks  0  1 14 sec  0   MPH  2.87  3.02  3.03   METS  3.3  3.58  3.32   RPE  15  15  12    Perceived Dyspnea   2  3  1    VO2 Peak  11.6  12.52  11.61   Symptoms  No  Yes (comment)  No   Comments  -  SOB  -   Resting HR  72 bpm  62 bpm  60 bpm   Resting BP  128/64  124/64  126/78   Resting Oxygen Saturation   97 %  95 %  97 %   Exercise Oxygen Saturation  during 6 min walk  90 %  89 %  91 %   Max Ex. HR  110 bpm  109 bpm  80 bpm   Max Ex. BP  134/78  156/86  150/82   2 Minute Post BP  128/72  154/60  -     Interval HR   1 Minute HR  101  -  65   2 Minute HR  106  -  75   3 Minute HR  103  -  80   4 Minute HR  -  104 rest break [id number]  -  pulse ox battery failure: no data   5 Minute HR  110  109  - pulse ox battery failure: no data   6 Minute HR  100  104  78   2 Minute Post HR  -  81  -   Interval Heart Rate?  Yes  Yes  Yes     Interval Oxygen   Interval Oxygen?  Yes  -  Yes   Baseline Oxygen Saturation %  97 %  95 %  97 %   1 Minute Oxygen Saturation %  91 %  94 %  93 %   1 Minute Liters of Oxygen  0 L  0 L Room Air  0 L   2 Minute Oxygen Saturation %  90 %  93 %  91 %   2 Minute Liters of Oxygen  0 L  0 L  0 L   3 Minute Oxygen Saturation %  94 %  92 %  91 %   3 Minute Liters of Oxygen  0 L  0 L  0 L   4 Minute Oxygen Saturation %  -  91 %  - pulse ox battery failure: no data   4 Minute Liters of Oxygen  0 L  0 L  -   5 Minute Oxygen Saturation %  93 %  89 %  - pulse ox battery failure: no data   5 Minute Liters of Oxygen  0 L  0 L  -   6 Minute Oxygen Saturation %  94 %  91 %  96 %   6 Minute Liters of Oxygen  0 L  0 L  0 L   2 Minute Post Oxygen Saturation %  97 %  94 %  -   2 Minute Post Liters of Oxygen  0 L  0 L  -        Dr. Emily Filbert is Medical Director for [place] and [facility].

## 2017-08-09 ENCOUNTER — Encounter: Payer: Medicare Other | Attending: Family Medicine

## 2017-08-09 DIAGNOSIS — J441 Chronic obstructive pulmonary disease with (acute) exacerbation: Secondary | ICD-10-CM | POA: Insufficient documentation

## 2017-08-09 DIAGNOSIS — J449 Chronic obstructive pulmonary disease, unspecified: Secondary | ICD-10-CM

## 2017-08-09 DIAGNOSIS — E119 Type 2 diabetes mellitus without complications: Secondary | ICD-10-CM | POA: Diagnosis not present

## 2017-08-09 NOTE — Progress Notes (Signed)
Pulmonary Individual Treatment Plan  Patient Details  Name: Melvin Brewer MRN: [id number] Date of Birth: 05-15-1950 Referring Provider:     Pulmonary Rehab from 05/02/2017 in [facility] Cardiac [facility] Pulmonary Rehab  Referring Provider  [place]is      Initial Encounter Date:    Pulmonary Rehab from 05/02/2017 in [facility] Cardiac and Pulmonary Rehab  Date  05/02/17  Referring Provider  Grandis      Visit Diagnosis: Chronic obstructive pulmonary disease, unspecified COPD type ([place])  Patient's Home Medications on Admission:  Current Outpatient Medications:  .  albuterol (PROVENTIL HFA;VENTOLIN HFA) 108 (90 BASE) MCG/ACT inhaler, Inhale 2 puffs into the lungs every 6 (six) hours as needed for wheezing or shortness of breath., Disp: , Rfl:  .  amLODipine (NORVASC) 2.5 MG tablet, Take 1 tablet by mouth daily., Disp: , Rfl:  .  aspirin EC 81 MG tablet, Take 1 tablet by mouth daily., Disp: , Rfl:  .  atorvastatin (LIPITOR) 40 MG tablet, Take 1 tablet by mouth daily., Disp: , Rfl:  .  budesonide-formoterol (SYMBICORT) 160-4.5 MCG/ACT inhaler, Place 2 sprays into the nose 2 (two) times daily., Disp: , Rfl:  .  Cetirizine HCl (ZYRTEC ALLERGY) 10 MG CAPS, Take 1 capsule by mouth daily., Disp: , Rfl:  .  Cholecalciferol (VITAMIN D-1000 MAX ST) 1000 units tablet, Take 1 tablet by mouth daily., Disp: , Rfl:  .  DENTA 5000 PLUS 1.1 % CREA dental cream, Take 1 application by mouth 3 (three) times daily. Brush teeth for at least 1 min 2-3 times a day, Disp: , Rfl: 5 .  docusate sodium 100 MG CAPS, Take 100 mg by mouth 2 (two) times daily. (Patient not taking: Reported on 05/15/2017), Disp: 60 capsule, Rfl: 0 .  fluticasone (FLONASE) 50 MCG/ACT nasal spray, Place 1 spray into both nostrils daily., Disp: , Rfl:  .  metFORMIN (GLUCOPHAGE) 500 MG tablet, Take 1 tablet by mouth daily., Disp: , Rfl:  .  metoprolol (LOPRESSOR) 50 MG tablet, Take 50 mg by mouth 2 (two) times daily., Disp: , Rfl:  .  theophylline  (UNIPHYL) 400 MG 24 hr tablet, Take 1 tablet by mouth daily., Disp: , Rfl:  .  Tiotropium Bromide Monohydrate (SPIRIVA RESPIMAT) 2.5 MCG/ACT AERS, Inhale 2 Inhalers into the lungs daily., Disp: , Rfl:   Past Medical History: Past Medical History:  Diagnosis Date  . Abdominal aortic aneurysm (AAA) ([place])   . COPD (chronic obstructive pulmonary disease) ([place])   . Coronary artery disease   . H/O hiatal hernia    AGE 31 S  NO MEDS  . Hyperlipidemia   . Hypertension   . Shortness of breath    W/ EXERTION     Tobacco Use: Social History   Tobacco Use  Smoking Status Former Smoker  . Packs/day: 2.00  . Years: 43.00  . Pack years: 86.00  . Types: Cigarettes  . Last attempt to quit: 04/11/2004  . Years since quitting: 13.3  Smokeless Tobacco Never Used    Labs: Recent Review Flowsheet Data    There is no flowsheet data to display.       Pulmonary Assessment Scores: Pulmonary Assessment Scores    Row Name 05/02/17 1106 06/12/17 1126 07/31/17 1113     ADL UCSD   ADL Phase  Entry  Mid  Exit   SOB Score total  44  3  26   Rest  0  0  0   Walk  0  1  0  Stairs  3  3  2    Bath  2  0  1   Dress  2  1  1    Shop  2  1  1      CAT Score   CAT Score  20  -  12     mMRC Score   mMRC Score  1  -  -      Pulmonary Function Assessment: Pulmonary Function Assessment - 05/02/17 1112      Initial Spirometry Results   FVC%  31 %    FEV1%  34 %    FEV1/FVC Ratio  74.02    Comments  Good patient effort      Post Bronchodilator Spirometry Results   FVC%  36.58 %    FEV1%  36.72 %    FEV1/FVC Ratio  73.38    Comments  Good patient effort      Breath   Bilateral Breath Sounds  Clear;Decreased    Shortness of Breath  Yes;Limiting activity       Exercise Target Goals:    Exercise Program Goal: Individual exercise prescription set using results from initial 6 min walk test and THRR while considering  patient's activity barriers and safety.    Exercise Prescription  Goal: Initial exercise prescription builds to 30-45 minutes a day of aerobic activity, 2-3 days per week.  Home exercise guidelines will be given to patient during program as part of exercise prescription that the participant will acknowledge.  Activity Barriers & Risk Stratification:   6 Minute Walk: 6 Minute Walk    Row Name 05/02/17 1217 08/02/17 1044 08/07/17 1149     6 Minute Walk   Phase  -  Discharge  Discharge   Distance  1520 feet  1535 feet  1600 feet   Distance % Change  -  0.9 %  5.3 %   Distance Feet Change  -  15 ft  80 ft   Walk Time  6 minutes  5.77 minutes  6 minutes   # of Rest Breaks  0  1 14 sec  0   MPH  2.87  3.02  3.03   METS  3.3  3.58  3.32   RPE  15  15  12    Perceived Dyspnea   2  3  1    VO2 Peak  11.6  12.52  11.61   Symptoms  No  Yes (comment)  No   Comments  -  SOB  -   Resting HR  72 bpm  62 bpm  60 bpm   Resting BP  128/64  124/64  126/78   Resting Oxygen Saturation   97 %  95 %  97 %   Exercise Oxygen Saturation  during 6 min walk  90 %  89 %  91 %   Max Ex. HR  110 bpm  109 bpm  80 bpm   Max Ex. BP  134/78  156/86  150/82   2 Minute Post BP  128/72  154/60  -     Interval HR   1 Minute HR  101  -  65   2 Minute HR  106  -  75   3 Minute HR  103  -  80   4 Minute HR  -  104 rest break [id number]  - pulse ox battery failure: no data   5 Minute HR  110  109  - pulse ox battery failure: no data  6 Minute HR  100  104  78   2 Minute Post HR  -  81  -   Interval Heart Rate?  Yes  Yes  Yes     Interval Oxygen   Interval Oxygen?  Yes  -  Yes   Baseline Oxygen Saturation %  97 %  95 %  97 %   1 Minute Oxygen Saturation %  91 %  94 %  93 %   1 Minute Liters of Oxygen  0 L  0 L Room Air  0 L   2 Minute Oxygen Saturation %  90 %  93 %  91 %   2 Minute Liters of Oxygen  0 L  0 L  0 L   3 Minute Oxygen Saturation %  94 %  92 %  91 %   3 Minute Liters of Oxygen  0 L  0 L  0 L   4 Minute Oxygen Saturation %  -  91 %  - pulse ox battery failure: no  data   4 Minute Liters of Oxygen  0 L  0 L  -   5 Minute Oxygen Saturation %  93 %  89 %  - pulse ox battery failure: no data   5 Minute Liters of Oxygen  0 L  0 L  -   6 Minute Oxygen Saturation %  94 %  91 %  96 %   6 Minute Liters of Oxygen  0 L  0 L  0 L   2 Minute Post Oxygen Saturation %  97 %  94 %  -   2 Minute Post Liters of Oxygen  0 L  0 L  -     Oxygen Initial Assessment: Oxygen Initial Assessment - 05/02/17 1110      Home Oxygen   Home Oxygen Device  None    Sleep Oxygen Prescription  None    Home Exercise Oxygen Prescription  None    Home at Rest Exercise Oxygen Prescription  None      Initial 6 min Walk   Oxygen Used  None      Program Oxygen Prescription   Program Oxygen Prescription  None      Intervention   Short Term Goals  To learn and understand importance of maintaining oxygen saturations>88%;To learn and demonstrate proper use of respiratory medications;To learn and demonstrate proper pursed lip breathing techniques or other breathing techniques.;To learn and understand importance of monitoring SPO2 with pulse oximeter and demonstrate accurate use of the pulse oximeter.    Long  Term Goals  Verbalizes importance of monitoring SPO2 with pulse oximeter and return demonstration;Maintenance of O2 saturations>88%;Compliance with respiratory medication;Demonstrates proper use of MDI's;Exhibits proper breathing techniques, such as pursed lip breathing or other method taught during program session       Oxygen Re-Evaluation: Oxygen Re-Evaluation    Row Name 05/05/17 0950 06/07/17 1036 06/30/17 1217         Program Oxygen Prescription   Program Oxygen Prescription  -  None  None       Home Oxygen   Home Oxygen Device  -  None  None     Sleep Oxygen Prescription  -  None  None     Home Exercise Oxygen Prescription  -  None  None     Home at Rest Exercise Oxygen Prescription  -  None  None       Goals/Expected Outcomes  Short Term Goals  To learn and  understand importance of maintaining oxygen saturations>88%;To learn and demonstrate proper use of respiratory medications;To learn and demonstrate proper pursed lip breathing techniques or other breathing techniques.;To learn and understand importance of monitoring SPO2 with pulse oximeter and demonstrate accurate use of the pulse oximeter.  To learn and understand importance of maintaining oxygen saturations>88%;To learn and demonstrate proper use of respiratory medications;To learn and demonstrate proper pursed lip breathing techniques or other breathing techniques.;To learn and understand importance of monitoring SPO2 with pulse oximeter and demonstrate accurate use of the pulse oximeter.  To learn and understand importance of maintaining oxygen saturations>88%;To learn and demonstrate proper use of respiratory medications;To learn and demonstrate proper pursed lip breathing techniques or other breathing techniques.;To learn and understand importance of monitoring SPO2 with pulse oximeter and demonstrate accurate use of the pulse oximeter.     Long  Term Goals  Verbalizes importance of monitoring SPO2 with pulse oximeter and return demonstration;Maintenance of O2 saturations>88%;Compliance with respiratory medication;Demonstrates proper use of MDI's;Exhibits proper breathing techniques, such as pursed lip breathing or other method taught during program session  Verbalizes importance of monitoring SPO2 with pulse oximeter and return demonstration;Maintenance of O2 saturations>88%;Compliance with respiratory medication;Demonstrates proper use of MDI's;Exhibits proper breathing techniques, such as pursed lip breathing or other method taught during program session  Verbalizes importance of monitoring SPO2 with pulse oximeter and return demonstration;Maintenance of O2 saturations>88%;Compliance with respiratory medication;Demonstrates proper use of MDI's;Exhibits proper breathing techniques, such as pursed lip  breathing or other method taught during program session     Comments  Reviewed PLB technique with pt.  Talked about how it work and it's important to maintaining his exercise saturations.    Melvin Brewer checks his oxygen daily at home. He knows to keep his oxygen above 88 percent. Nebulizer is used as needed. He uses his spacer with his inhalers at home.  Melvin Brewer  continues to check his oxygen at home. He is using his medications correctly and is and advocate for what works best for him, even when insurance wants to change it. His shortness of breath has improved, as he is working on his PLB and increasing his stamina. He understands the importance of setting limits for himself and not overdoing it.      Goals/Expected Outcomes  Short: Become more profiecient at using PLB.   Long: Become independent at using PLB.  Short: be compliant with home medications. Long: maintain medications and checking his oxygen at home.  Short: continue to be compliant with his medications and checking his pulse ox. Long: maintain independence with PLB and minimizing SOB episodes.         Oxygen Discharge (Final Oxygen Re-Evaluation): Oxygen Re-Evaluation - 06/30/17 1217      Program Oxygen Prescription   Program Oxygen Prescription  None      Home Oxygen   Home Oxygen Device  None    Sleep Oxygen Prescription  None    Home Exercise Oxygen Prescription  None    Home at Rest Exercise Oxygen Prescription  None      Goals/Expected Outcomes   Short Term Goals  To learn and understand importance of maintaining oxygen saturations>88%;To learn and demonstrate proper use of respiratory medications;To learn and demonstrate proper pursed lip breathing techniques or other breathing techniques.;To learn and understand importance of monitoring SPO2 with pulse oximeter and demonstrate accurate use of the pulse oximeter.    Long  Term Goals  Verbalizes importance of monitoring SPO2 with  pulse oximeter and return demonstration;Maintenance  of O2 saturations>88%;Compliance with respiratory medication;Demonstrates proper use of MDI's;Exhibits proper breathing techniques, such as pursed lip breathing or other method taught during program session    Comments  Melvin Brewer  continues to check his oxygen at home. He is using his medications correctly and is and advocate for what works best for him, even when insurance wants to change it. His shortness of breath has improved, as he is working on his PLB and increasing his stamina. He understands the importance of setting limits for himself and not overdoing it.     Goals/Expected Outcomes  Short: continue to be compliant with his medications and checking his pulse ox. Long: maintain independence with PLB and minimizing SOB episodes.        Initial Exercise Prescription: Initial Exercise Prescription - 05/02/17 1200      Date of Initial Exercise RX and Referring Provider   Date  05/02/17    Referring Provider  Grandis      Treadmill   MPH  2.5    Grade  1    Minutes  15      Recumbant Bike   Level  4    RPM  60    Watts  40    Minutes  15    METs  3.3      Elliptical   Level  1    Speed  2.5    Minutes  15      REL-XR   Level  3    Speed  50    Minutes  15    METs  3.3      Prescription Details   Frequency (times per week)  3    Duration  Progress to 45 minutes of aerobic exercise without signs/symptoms of physical distress      Intensity   THRR 40-80% of Max Heartrate  104-136    Ratings of Perceived Exertion  11-13    Perceived Dyspnea  0-4      Resistance Training   Training Prescription  Yes    Weight  4 lb    Reps  10-15       Perform Capillary Blood Glucose checks as needed.  Exercise Prescription Changes: Exercise Prescription Changes    Row Name 05/11/17 1000 05/24/17 1100 05/24/17 1400 06/07/17 1500 06/21/17 1400     Response to Exercise   Blood Pressure (Admit)  134/60  -  120/68  114/60  110/64   Blood Pressure (Exercise)  126/70  -  -  -  -    Blood Pressure (Exit)  134/70  -  128/70  104/62  102/62   Heart Rate (Admit)  65 bpm  -  65 bpm  69 bpm  65 bpm   Heart Rate (Exercise)  95 bpm  -  96 bpm  96 bpm  87 bpm   Heart Rate (Exit)  76 bpm  -  70 bpm  73 bpm  67 bpm   Oxygen Saturation (Admit)  96 %  -  93 %  95 %  95 %   Oxygen Saturation (Exercise)  92 %  -  93 %  93 %  94 %   Oxygen Saturation (Exit)  94 %  -  94 %  95 %  92 %   Rating of Perceived Exertion (Exercise)  13  -  15  13  14    Perceived Dyspnea (Exercise)  2  -  2.5  2  2   Symptoms  none  -  none  none  none   Duration  Progress to 45 minutes of aerobic exercise without signs/symptoms of physical distress  -  Progress to 45 minutes of aerobic exercise without signs/symptoms of physical distress  Progress to 45 minutes of aerobic exercise without signs/symptoms of physical distress  Progress to 45 minutes of aerobic exercise without signs/symptoms of physical distress   Intensity  THRR unchanged  -  THRR unchanged  THRR unchanged  THRR unchanged     Progression   Progression  Continue to progress workloads to maintain intensity without signs/symptoms of physical distress.  -  Continue to progress workloads to maintain intensity without signs/symptoms of physical distress.  Continue to progress workloads to maintain intensity without signs/symptoms of physical distress.  Continue to progress workloads to maintain intensity without signs/symptoms of physical distress.   Average METs  2.9  -  2.8  3  3.35     Resistance Training   Training Prescription  Yes  -  Yes  Yes  Yes   Weight  4 lb  -  4 lb  4 lb  4 lb   Reps  10-15  -  10-15  10-15  10-15     Interval Training   Interval Training  No  -  No  No  No     Treadmill   MPH  -  -  -  2.5  2.6   Grade  -  -  -  1  1   Minutes  -  -  -  15  15     Arm Ergometer   Level  -  -  -  -  1.4   Minutes  -  -  -  -  15   METs  -  -  -  -  2.4     REL-XR   Level  -  -  3  4  5    Speed  -  -  50  50  50   Minutes   -  -  15  15  15    METs  -  -  2.8  2.8  4.3     Home Exercise Plan   Plans to continue exercise at  -  [employer] (comment) [employer] (comment) [employer] (comment[employer]  -   Frequency  -  Add 1 additional day to program exercise sessions.  Add 1 additional day to program exercise sessions.  Add 1 additional day to program exercise sessions.  -   Initial Home Exercises Provided  -  05/24/17  -  -  -   Row Name 07/05/17 1200 07/19/17 1100 08/03/17 0900         Response to Exercise   Blood Pressure (Admit)  126/72  130/66  124/64     Blood Pressure (Exit)  110/60  124/70  124/60     Heart Rate (Admit)  62 bpm  61 bpm  62 bpm     Heart Rate (Exercise)  76 bpm  86 bpm  109 bpm     Heart Rate (Exit)  63 bpm  76 bpm  67 bpm     Oxygen Saturation (Admit)  95 %  96 %  95 %     Oxygen Saturation (Exercise)  94 %  93 %  91 %     Oxygen Saturation (Exit)  93 %  93 %  93 %     Rating of Perceived Exertion (Exercise)  13  13  15      Perceived Dyspnea (Exercise)  1  3  3      Symptoms  none  took inhaler between stations  -     Duration  Continue with 45 min of aerobic exercise without signs/symptoms of physical distress.  Continue with 45 min of aerobic exercise without signs/symptoms of physical distress.  Continue with 45 min of aerobic exercise without signs/symptoms of physical distress.     Intensity  Other (comment) HR rest + 20-30 due to BP meds  THRR unchanged  THRR unchanged       Progression   Progression  Continue to progress workloads to maintain intensity without signs/symptoms of physical distress.  Continue to progress workloads to maintain intensity without signs/symptoms of physical distress.  Continue to progress workloads to maintain intensity without signs/symptoms of physical distress.     Average METs  3  3.5  3.6       Resistance Training   Training Prescription  Yes  Yes  Yes     Weight  4 lb  5 lb  5 lb     Reps  10-15  10-15  10-15        Interval Training   Interval Training  No  No  No       Treadmill   MPH  2.5  2.5  -     Grade  1.5  1.5  -     Minutes  15  15  -       Arm Ergometer   Level  1.4  -  -     Minutes  15  -  -     METs  2.7  -  -       REL-XR   Level  -  8  8     Speed  -  50  -     Minutes  -  15  15     METs  -  -  3.6       Home Exercise Plan   Plans to continue exercise at  -  [employer] (comment) [employer] (comment) [facility]     Frequency  -  Add 1 additional day to program exercise sessions.  Add 1 additional day to program exercise sessions.        Exercise Comments: Exercise Comments    Row Name 05/05/17 7564 06/06/17 1659 08/07/17 1209 08/09/17 0946     Exercise Comments  First full day of exercise!  Patient was oriented to gym and equipment including functions, settings, policies, and procedures.  Patient's individual exercise prescription and treatment plan were reviewed.  All starting workloads were established based on the results of the 6 minute walk test done at initial orientation visit.  The plan for exercise progression was also introduced and progression will be customized based on patient's performance and goals.  Melvin Brewer is switching from the eliptical to the arm crank. He states that the eliptical is bothering his knees and he gets too short of breath.   Melvin Brewer re-did post 6 min walk today because he was having a bad breathing day the last time. He made increases and results were reviewed with him. See 6 min walk data sheet for detailed report.   Melvin Brewer graduated today from  rehab with 36 sessions completed.  Details of the patient's exercise prescription  and what He needs to do in order to continue the prescription and progress were discussed with patient.  Patient was given a copy of prescription and goals.  Patient verbalized understanding.  Melvin Brewer plans to continue to exercise by walking and exercising at home.       Exercise Goals and Review: Exercise  Goals    Row Name 05/02/17 1216             Exercise Goals   Increase Physical Activity  Yes       Intervention  Provide advice, education, support and counseling about physical activity/exercise needs.;Develop an individualized exercise prescription for aerobic and resistive training based on initial evaluation findings, risk stratification, comorbidities and participant's personal goals.       Expected Outcomes  Short Term: Attend rehab on a regular basis to increase amount of physical activity.;Long Term: Add in home exercise to make exercise part of routine and to increase amount of physical activity.;Long Term: Exercising regularly at least 3-5 days a week.       Increase Strength and Stamina  Yes       Intervention  Provide advice, education, support and counseling about physical activity/exercise needs.;Develop an individualized exercise prescription for aerobic and resistive training based on initial evaluation findings, risk stratification, comorbidities and participant's personal goals.       Expected Outcomes  Short Term: Increase workloads from initial exercise prescription for resistance, speed, and METs.;Short Term: Perform resistance training exercises routinely during rehab and add in resistance training at home;Long Term: Improve cardiorespiratory fitness, muscular endurance and strength as measured by increased METs and functional capacity (6MWT)       Able to understand and use rate of perceived exertion (RPE) scale  Yes       Intervention  Provide education and explanation on how to use RPE scale       Expected Outcomes  Short Term: Able to use RPE daily in rehab to express subjective intensity level;Long Term:  Able to use RPE to guide intensity level when exercising independently       Able to understand and use Dyspnea scale  Yes       Intervention  Provide education and explanation on how to use Dyspnea scale       Expected Outcomes  Short Term: Able to use Dyspnea scale  daily in rehab to express subjective sense of shortness of breath during exertion;Long Term: Able to use Dyspnea scale to guide intensity level when exercising independently       Knowledge and understanding of Target Heart Rate Range (THRR)  Yes       Intervention  Provide education and explanation of THRR including how the numbers were predicted and where they are located for reference       Expected Outcomes  Short Term: Able to state/look up THRR;Long Term: Able to use THRR to govern intensity when exercising independently;Short Term: Able to use daily as guideline for intensity in rehab       Able to check pulse independently  Yes       Intervention  Provide education and demonstration on how to check pulse in carotid and radial arteries.;Review the importance of being able to check your own pulse for safety during independent exercise       Expected Outcomes  Short Term: Able to explain why pulse checking is important during independent exercise;Long Term: Able to check pulse independently and accurately       Understanding of Exercise  Prescription  Yes       Intervention  Provide education, explanation, and written materials on patient's individual exercise prescription       Expected Outcomes  Short Term: Able to explain program exercise prescription;Long Term: Able to explain home exercise prescription to exercise independently          Exercise Goals Re-Evaluation : Exercise Goals Re-Evaluation    Row Name 05/05/17 0949 05/11/17 1028 05/24/17 1136 05/24/17 1439 06/07/17 1535     Exercise Goal Re-Evaluation   Exercise Goals Review  Understanding of Exercise Prescription;Able to understand and use Dyspnea scale;Knowledge and understanding of Target Heart Rate Range (THRR);Able to understand and use rate of perceived exertion (RPE) scale  Increase Physical Activity;Increase Strength and Stamina;Able to understand and use rate of perceived exertion (RPE) scale;Able to understand and use  Dyspnea scale  Increase Physical Activity;Increase Strength and Stamina  Increase Physical Activity;Able to understand and use rate of perceived exertion (RPE) scale;Increase Strength and Stamina;Able to understand and use Dyspnea scale;Knowledge and understanding of Target Heart Rate Range (THRR)  Increase Physical Activity;Able to understand and use rate of perceived exertion (RPE) scale;Able to understand and use Dyspnea scale;Increase Strength and Stamina   Comments  Reviewed RPE scale, THR and program prescription with pt today.  Pt voiced understanding and was given a copy of goals to take home  Melvin Brewer is tolerating exercise well in his first week.  He is motivated to work hard in class.  Reviewed home exercise with pt today.  Pt plans to go to the gym 1 extra day a week for exercise.  Reviewed THR, pulse, RPE, sign and symptoms, NTG use, and when to call 911 or MD.  Also discussed weather considerations and indoor options.  Pt voiced understanding.  Melvin Brewer has improved to doing 12 min continuous on Elliptical after only doing 5 to start.  Staff will continue to monitor.  Melvin Brewer is tolerating exercise well.  He is building up time on the elliptical to reach teh full 15 minutes.  He has substituted the arm crank a couple times due to knee pain   Expected Outcomes  Short: Use RPE daily to regulate intensity.  Long: Follow program prescription in THR.  Short - Aaronjames will continue to attend regularly  Long - Freman will improve overall fitness   Short: add 1 extra day of exercise. Long: maintain and add more days to home exercise.  Short - [place] will reach 15 min on elliptical Long - Jeron will improve overall fitness  Short - Chasyn will continue to attend Long - Laney will maintain exercise on his own   [place] Name 06/21/17 1452 06/30/17 1120 07/05/17 1211 07/19/17 1151 08/03/17 0927     Exercise Goal Re-Evaluation   Exercise Goals Review  Increase Strength and Stamina;Increase Physical Activity;Able to  understand and use rate of perceived exertion (RPE) scale;Able to understand and use Dyspnea scale  Increase Strength and Stamina;Increase Physical Activity;Able to understand and use rate of perceived exertion (RPE) scale;Able to understand and use Dyspnea scale  Increase Strength and Stamina;Increase Physical Activity;Able to understand and use rate of perceived exertion (RPE) scale;Able to understand and use Dyspnea scale;Knowledge and understanding of Target Heart Rate Range (THRR);Understanding of Exercise Prescription;Able to check pulse independently  Increase Physical Activity;Able to understand and use rate of perceived exertion (RPE) scale;Increase Strength and Stamina;Able to understand and use Dyspnea scale  Increase Physical Activity;Able to understand and use rate of perceived exertion (RPE) scale;Increase Strength and  Stamina;Able to understand and use Dyspnea scale;Knowledge and understanding of Target Heart Rate Range (THRR);Understanding of Exercise Prescription   Comments  Melvin Brewer is progressing well with exercise. Staff will continue to monitor.  Melvin Brewer enjoying exercise and the progress he has made. He is planning on going on a day mission trip the end of March. He is planning on pacing himself, but he is happy to be feeling better and that his stamina has improved.   Melvin Brewer is progressing well and has increased level on XR and TM. Staff will continue to monitor  Melvin Brewer has increased weight strength training and level on XR.  Overall METS increased.  Melvin Brewer has two session left in [place].  He has lost weight and has a plan to continue exercise upon graduating.   Expected Outcomes  Short - Melvin Brewer will continue to exercise regularly  Long - Melvin Brewer will progress MET levels  Short: Melvin Brewer will continue to exercise in [place] and at home. Long: Jobany will become independent with exercise outside of LW.   Short - Len will attend 3 days and exercise at home Melvin Brewer will maintain exercise on his own   Short - Melvin Brewer will complete LW program  Long - Melvin Brewer will maintain exercise on his own  Short - Melvin Brewer will finish LW Long - Melvin Brewer will maintain exercise on his own      Discharge Exercise Prescription (Final Exercise Prescription Changes): Exercise Prescription Changes - 08/03/17 0900      Response to Exercise   Blood Pressure (Admit)  124/64    Blood Pressure (Exit)  124/60    Heart Rate (Admit)  62 bpm    Heart Rate (Exercise)  109 bpm    Heart Rate (Exit)  67 bpm    Oxygen Saturation (Admit)  95 %    Oxygen Saturation (Exercise)  91 %    Oxygen Saturation (Exit)  93 %    Rating of Perceived Exertion (Exercise)  15    Perceived Dyspnea (Exercise)  3    Duration  Continue with 45 min of aerobic exercise without signs/symptoms of physical distress.    Intensity  THRR unchanged      Progression   Progression  Continue to progress workloads to maintain intensity without signs/symptoms of physical distress.    Average METs  3.6      Resistance Training   Training Prescription  Yes    Weight  5 lb    Reps  10-15      Interval Training   Interval Training  No      REL-XR   Level  8    Minutes  15    METs  3.6      Home Exercise Plan   Plans to continue exercise at  [employer] (comment) Gym    Frequency  Add 1 additional day to program exercise sessions.       Nutrition:  Target Goals: Understanding of nutrition guidelines, daily intake of sodium '1500mg'$ , cholesterol '200mg'$ , calories 30% from fat and 7% or less from saturated fats, daily to have 5 or more servings of fruits and vegetables.  Biometrics: Pre Biometrics - 05/02/17 1216      Pre Biometrics   Height  '5\' 10"'$  (1.778 m)    Weight  219 lb 9.6 oz (99.6 kg)    Waist Circumference  43 inches    Hip Circumference  43 inches    Waist to Hip Ratio  1 %    BMI (  Calculated)  31.51      Post Biometrics - 08/02/17 1049       Post  Biometrics   Height  5' 10"  (1.778 m)    Weight  211 lb 6.4 oz (95.9  kg)    Waist Circumference  42 inches    Hip Circumference  39.5 inches    Waist to Hip Ratio  1.06 %    BMI (Calculated)  30.33       Nutrition Therapy Plan and Nutrition Goals: Nutrition Therapy & Goals - 05/02/17 1105      Personal Nutrition Goals   Comments  He would like to lose weight and eat healthier. He would also like to meet with the dietician.      Intervention Plan   Intervention  Prescribe, educate and counsel regarding individualized specific dietary modifications aiming towards targeted core components such as weight, hypertension, lipid management, diabetes, heart failure and other comorbidities.;Nutrition handout(s) given to patient.    Expected Outcomes  Long Term Goal: Adherence to prescribed nutrition plan.;Short Term Goal: Understand basic principles of dietary content, such as calories, fat, sodium, cholesterol and nutrients.       Nutrition Assessments: Nutrition Assessments - 05/02/17 1126      MEDFICTS Scores   Pre Score  22       Nutrition Goals Re-Evaluation: Nutrition Goals Re-Evaluation    [place] Name 06/07/17 1045 06/30/17 1114           Goals   Current Weight  215 lb 9.6 oz (97.8 kg)  212 lb (96.2 kg)      Nutrition Goal  Lose weight. Stay away from sugar.  Continue making steps to reach goal weight. Manage his diet in relation to his diabetes      Comment  He has seen his diabetic dietician last month. He snacks sometimes at night and is trying not to do it. He states he needs to eat healtier carbs.  He completed his diabetes class and has met with the dietician. He is cutting back on his nightly snacking, and when he does he is trying to eat healthier options (like carrots)      Expected Outcome  Short: eat dinner earlier, snack less before bed. Long: maintain eating dinner earlier and eliminate eating snacks before bed.  Short: continue cutting back on nightly snacks and replace the snacks he does have with healthier options. Long: maintain a  diabetic and heart healthy diet.          Nutrition Goals Discharge (Final Nutrition Goals Re-Evaluation): Nutrition Goals Re-Evaluation - 06/30/17 1114      Goals   Current Weight  212 lb (96.2 kg)    Nutrition Goal  Continue making steps to reach goal weight. Manage his diet in relation to his diabetes    Comment  He completed his diabetes class and has met with the dietician. He is cutting back on his nightly snacking, and when he does he is trying to eat healthier options (like carrots)    Expected Outcome  Short: continue cutting back on nightly snacks and replace the snacks he does have with healthier options. Long: maintain a diabetic and heart healthy diet.        Psychosocial: Target Goals: Acknowledge presence or absence of significant depression and/or stress, maximize coping skills, provide positive support system. Participant is able to verbalize types and ability to use techniques and skills needed for reducing stress and depression.   Initial Review & Psychosocial Screening: Initial  Psych Review & Screening - 05/02/17 1104      Initial Review   Current issues with  None Identified      Family Dynamics   Good Support System?  Yes    Comments  His family is a great support system      Barriers   Psychosocial barriers to participate in program  The patient should benefit from training in stress management and relaxation.      Screening Interventions   Interventions  Encouraged to exercise       Quality of Life Scores:  Scores of 19 and below usually indicate a poorer quality of life in these areas.  A difference of  2-3 points is a clinically meaningful difference.  A difference of 2-3 points in the total score of the Quality of Life Index has been associated with significant improvement in overall quality of life, self-image, physical symptoms, and general health in studies assessing change in quality of life.  PHQ-9: Recent Review Flowsheet Data    Depression  screen [facility] 2/9 07/31/2017 06/19/2017 06/12/2017 05/15/2017 05/02/2017   Decreased Interest 0 0 0 0 0   Down, Depressed, Hopeless 0 0 0 0 0   PHQ - 2 Score 0 0 0 0 0   Altered sleeping 1 1 0 - 1   Tired, decreased energy 0 1 0 - 1   Change in appetite 0 - 0 - 0   Feeling bad or failure about yourself  0 - 0 - 0   Trouble concentrating 0 - 0 - 0   Moving slowly or fidgety/restless 0 - 0 - 0   Suicidal thoughts 0 - 0 - 0   PHQ-9 Score 1 2 0 - 2   Difficult doing work/chores Not difficult at all Not difficult at all Not difficult at all - Not difficult at all     Interpretation of Total Score  Total Score Depression Severity:  1-4 = Minimal depression, 5-9 = Mild depression, 10-14 = Moderate depression, 15-19 = Moderately severe depression, 20-27 = Severe depression   Psychosocial Evaluation and Intervention: Psychosocial Evaluation - 05/29/17 1042      Psychosocial Evaluation & Interventions   Interventions  Encouraged to exercise with the program and follow exercise prescription    Comments  Mr. Minus Odenthal)  has returned to this Pulmonary Rehab program due to exposure to [place] in the fall causing a severe reaction in his pulmonary status.  He is a 67 year old who has a strong support system with a spouse of 54 years and very active in his local church.  He has multiple health issues on top of the Pulmonary concerns; including recent diagnosis of diabetes; open heart surgery 12 years ago and he has a current Aortic Abdominal Aneurysm (AAA) which is being monitored by a doctor for potential surgery in the near future.  He sleeps well and has a good appetite.  Kayzen denies a history of depression or anxiety or any current symptoms and other than his health, he reports no stress in his life.  He has goals to breathe better and get back to normal activities again.  Staff will follow.    Expected Outcomes  Cayden will benefit from consistent exercise to achieve his stated goals.  The educational and  psychoeducational components of this program will be helpful in understanding and managing his health in a more positive way.  Staff will follow..    Continue Psychosocial Services   Follow up required  by staff       Psychosocial Re-Evaluation: Psychosocial Re-Evaluation    [place] Name 06/07/17 1049 06/28/17 1127           Psychosocial Re-Evaluation   Current issues with  None Identified  None Identified      Comments  Melvin Brewer shoots for a hobby and does church work. He has no stress concerns. He knows his COPD well and knows what to do to take care of himself. He is enjoying the program and he is happy to be losing weight.  Counselor follow up with Melvin Brewer today reporting he is feeling stronger and returning to his more normal enjoyable activities since coming into this program.  He reports continuing to sleep well and is accomplishing his goal to lose some weight while in this program.  Counselor commended Melvin Brewer on his progress so far and commitment to exercise consistently.        Expected Outcomes  Short: continue to attened [facility]. Long: maintain exercise program to keep stress to a minimum.  LT goal:  To continue exercising to enjoy positive quality of life.  ST - to continue practicing pacing himself in order to have realistic goals in scheduling activities.      Interventions  Encouraged to attend Pulmonary Rehabilitation for the exercise  Stress management education      Continue Psychosocial Services   -  Follow up required by staff         Psychosocial Discharge (Final Psychosocial Re-Evaluation): Psychosocial Re-Evaluation - 06/28/17 1127      Psychosocial Re-Evaluation   Current issues with  None Identified    Comments  Counselor follow up with Melvin Brewer today reporting he is feeling stronger and returning to his more normal enjoyable activities since coming into this program.  He reports continuing to sleep well and is accomplishing his goal to lose some weight while in this  program.  Counselor commended Melvin Brewer on his progress so far and commitment to exercise consistently.      Expected Outcomes  LT goal:  To continue exercising to enjoy positive quality of life.  ST - to continue practicing pacing himself in order to have realistic goals in scheduling activities.    Interventions  Stress management education    Continue Psychosocial Services   Follow up required by staff       Education: Education Goals: Education classes will be provided on a weekly basis, covering required topics. Participant will state understanding/return demonstration of topics presented.  Learning Barriers/Preferences: Learning Barriers/Preferences - 05/02/17 1237      Learning Barriers/Preferences   Learning Barriers  None    Learning Preferences  None       Education Topics:  Initial Evaluation Education: - Verbal, written and demonstration of respiratory meds, oximetry and breathing techniques. Instruction on use of nebulizers and MDIs and importance of monitoring MDI activations.   Pulmonary Rehab from 08/09/2017 in [facility] Cardiac and Pulmonary Rehab  Date  05/02/17  Educator  [facility]  Instruction Review Code  1- Verbalizes Understanding      General Nutrition Guidelines/Fats and Fiber: -Group instruction provided by verbal, written material, models and posters to present the general guidelines for heart healthy nutrition. Gives an explanation and review of dietary fats and fiber.   Pulmonary Rehab from 08/09/2017 in [facility] Cardiac and Pulmonary Rehab  Date  07/03/17  Educator  CR  Instruction Review Code  5- Refused Teaching      Controlling Sodium/Reading Food Labels: -Group verbal and written  material supporting the discussion of sodium use in heart healthy nutrition. Review and explanation with models, verbal and written materials for utilization of the food label.   Pulmonary Rehab from 08/09/2017 in [facility] Cardiac and Pulmonary Rehab  Date  07/10/17  Educator  CR  Instruction  Review Code  1- Verbalizes Understanding      Exercise Physiology & General Exercise Guidelines: - Group verbal and written instruction with models to review the exercise physiology of the cardiovascular system and associated critical values. Provides general exercise guidelines with specific guidelines to those with heart or lung disease.    Pulmonary Rehab from 08/09/2017 in [facility] Cardiac and Pulmonary Rehab  Date  07/19/17  Educator  jh  Instruction Review Code  1- Verbalizes Understanding      Aerobic Exercise & Resistance Training: - Gives group verbal and written instruction on the various components of exercise. Focuses on aerobic and resistive training programs and the benefits of this training and how to safely progress through these programs.   Pulmonary Rehab from 07/15/2016 in [facility] Cardiac and Pulmonary Rehab  Date  05/11/16  Educator  [facility]  Instruction Review Code (retired)  2- Statistician, Balance, Mind/Body Relaxation: Provides group verbal/written instruction on the benefits of flexibility and balance training, including mind/body exercise modes such as yoga, pilates and tai chi.  Demonstration and skill practice provided.   Pulmonary Rehab from 08/09/2017 in [facility] Cardiac and Pulmonary Rehab  Date  05/24/17  Educator  AS  Instruction Review Code  1- Verbalizes Understanding      Stress and Anxiety: - Provides group verbal and written instruction about the health risks of elevated stress and causes of high stress.  Discuss the correlation between heart/lung disease and anxiety and treatment options. Review healthy ways to manage with stress and anxiety.   Pulmonary Rehab from 08/09/2017 in [facility] Cardiac and Pulmonary Rehab  Date  06/14/17  Educator  [facility]  Instruction Review Code  1- Verbalizes Understanding      Depression: - Provides group verbal and written instruction on the correlation between heart/lung disease and depressed mood, treatment  options, and the stigmas associated with seeking treatment.   Pulmonary Rehab from 07/15/2016 in [facility] Cardiac and Pulmonary Rehab  Date  06/15/16  Educator  [facility]  Instruction Review Code (retired)  2- meets goals/outcomes      Exercise & Equipment Safety: - Individual verbal instruction and demonstration of equipment use and safety with use of the equipment.   Pulmonary Rehab from 08/09/2017 in [facility] Cardiac and Pulmonary Rehab  Date  05/02/17  Educator  [facility]  Instruction Review Code  1- Verbalizes Understanding      Infection Prevention: - Provides verbal and written material to individual with discussion of infection control including proper hand washing and proper equipment cleaning during exercise session.   Pulmonary Rehab from 08/09/2017 in [facility] Cardiac and Pulmonary Rehab  Date  05/02/17  Educator  [facility]  Instruction Review Code  1- Verbalizes Understanding      Falls Prevention: - Provides verbal and written material to individual with discussion of falls prevention and safety.   Pulmonary Rehab from 08/09/2017 in [facility] Cardiac and Pulmonary Rehab  Date  05/02/17  Educator  [facility]  Instruction Review Code  1- Verbalizes Understanding      Diabetes: - Individual verbal and written instruction to review signs/symptoms of diabetes, desired ranges of glucose level fasting, after meals and with exercise. Advice that pre and  post exercise glucose checks will be done for 3 sessions at entry of program.   Chronic Lung Diseases: - Group verbal and written instruction to review updates, respiratory medications, advancements in procedures and treatments. Discuss use of supplemental oxygen including available portable oxygen systems, continuous and intermittent flow rates, concentrators, personal use and safety guidelines. Review proper use of inhaler and spacers. Provide informative websites for self-education.    Pulmonary Rehab from 08/09/2017 in [facility] Cardiac and Pulmonary Rehab  Date  07/05/17   Educator  [facility]  Instruction Review Code  1- Verbalizes Understanding      Energy Conservation: - Provide group verbal and written instruction for methods to conserve energy, plan and organize activities. Instruct on pacing techniques, use of adaptive equipment and posture/positioning to relieve shortness of breath.   Pulmonary Rehab from 08/09/2017 in [facility] Cardiac and Pulmonary Rehab  Date  06/07/17  Educator  [facility]  Instruction Review Code  1- Verbalizes Understanding      Triggers and Exacerbations: - Group verbal and written instruction to review types of environmental triggers and ways to prevent exacerbations. Discuss weather changes, air quality and the benefits of nasal washing. Review warning signs and symptoms to help prevent infections. Discuss techniques for effective airway clearance, coughing, and vibrations.   Pulmonary Rehab from 08/09/2017 in [facility] Cardiac and Pulmonary Rehab  Date  08/02/17  Educator  [facility]  Instruction Review Code  1- Verbalizes Understanding      AED/CPR: - Group verbal and written instruction with the use of models to demonstrate the basic use of the AED with the basic ABC's of resuscitation.   Pulmonary Rehab from 08/09/2017 in [facility] Cardiac and Pulmonary Rehab  Date  06/09/17  Educator  [facility]  Instruction Review Code  1- Actuary and Physiology of the Lungs: - Group verbal and written instruction with the use of models to provide basic lung anatomy and physiology related to function, structure and complications of lung disease.   Pulmonary Rehab from 07/15/2016 in [facility] Cardiac and Pulmonary Rehab  Date  07/01/16  Educator  [facility]  Instruction Review Code (retired)  2- meets Designer, fashion/clothing & Physiology of the Heart: - Group verbal and written instruction and models provide basic cardiac anatomy and physiology, with the coronary electrical and arterial systems. Review of Valvular disease and Heart Failure   Pulmonary  Rehab from 08/09/2017 in [facility] Cardiac and Pulmonary Rehab  Date  08/09/17  Educator  [facility]  Instruction Review Code  1- Verbalizes Understanding      Cardiac Medications: - Group verbal and written instruction to review commonly prescribed medications for heart disease. Reviews the medication, class of the drug, and side effects.   Pulmonary Rehab from 08/09/2017 in [facility] Cardiac and Pulmonary Rehab  Date  05/26/17  Educator  [facility]  Instruction Review Code  5- Refused Teaching      Know Your Numbers and Risk Factors: -Group verbal and written instruction about important numbers in your health.  Discussion of what are risk factors and how they play a role in the disease process.  Review of Cholesterol, Blood Pressure, Diabetes, and BMI and the role they play in your overall health.   Pulmonary Rehab from 08/09/2017 in [facility] Cardiac and Pulmonary Rehab  Date  07/21/17  Educator  [facility]  Instruction Review Code  1- Verbalizes Understanding      Sleep Hygiene: -Provides group verbal and written instruction about how sleep  can affect your health.  Define sleep hygiene, discuss sleep cycles and impact of sleep habits. Review good sleep hygiene tips.    Pulmonary Rehab from 08/09/2017 in [facility] Cardiac and Pulmonary Rehab  Date  07/26/17  Educator  [facility]  Instruction Review Code  1- Verbalizes Understanding      Other: -Provides group and verbal instruction on various topics (see comments)    Knowledge Questionnaire Score: Knowledge Questionnaire Score - 07/31/17 1114      Knowledge Questionnaire Score   Pre Score  14/18    Post Score  17/18 reviewed with patient        Core Components/Risk Factors/Patient Goals at Admission: Personal Goals and Risk Factors at Admission - 05/02/17 1111      Core Components/Risk Factors/Patient Goals on Admission    Weight Management  Yes;Weight Loss    Intervention  Weight Management: Develop a combined nutrition and exercise program designed to reach desired  caloric intake, while maintaining appropriate intake of nutrient and fiber, sodium and fats, and appropriate energy expenditure required for the weight goal.;Weight Management: Provide education and appropriate resources to help participant work on and attain dietary goals.;Obesity: Provide education and appropriate resources to help participant work on and attain dietary goals.    Admit Weight  219 lb 4.8 oz (99.5 kg)    Goal Weight: Short Term  214 lb (97.1 kg)    Goal Weight: Long Term  180 lb (81.6 kg)    Expected Outcomes  Short Term: Continue to assess and modify interventions until short term weight is achieved;Long Term: Adherence to nutrition and physical activity/exercise program aimed toward attainment of established weight goal;Weight Loss: Understanding of general recommendations for a balanced deficit meal plan, which promotes 1-2 lb weight loss per week and includes a negative energy balance of [phone] kcal/d;Understanding recommendations for meals to include 15-35% energy as protein, 25-35% energy from fat, 35-60% energy from carbohydrates, less than 27m of dietary cholesterol, 20-35 gm of total fiber daily;Understanding of distribution of calorie intake throughout the day with the consumption of 4-5 meals/snacks    Improve shortness of breath with ADL's  Yes    Intervention  Provide education, individualized exercise plan and daily activity instruction to help decrease symptoms of SOB with activities of daily living.    Expected Outcomes  Short Term: Improve cardiorespiratory fitness to achieve a reduction of symptoms when performing ADLs;Long Term: Be able to perform more ADLs without symptoms or delay the onset of symptoms    Diabetes  Yes pre diabetic    Intervention  Provide education about signs/symptoms and action to take for hypo/hyperglycemia.;Provide education about proper nutrition, including hydration, and aerobic/resistive exercise prescription along with prescribed  medications to achieve blood glucose in normal ranges: Fasting glucose 65-99 mg/dL    Expected Outcomes  Short Term: Participant verbalizes understanding of the signs/symptoms and immediate care of hyper/hypoglycemia, proper foot care and importance of medication, aerobic/resistive exercise and nutrition plan for blood glucose control.;Long Term: Attainment of HbA1C < 7%.    Hypertension  Yes    Intervention  Provide education on lifestyle modifcations including regular physical activity/exercise, weight management, moderate sodium restriction and increased consumption of fresh fruit, vegetables, and low fat dairy, alcohol moderation, and smoking cessation.;Monitor prescription use compliance.    Expected Outcomes  Short Term: Continued assessment and intervention until BP is < 140/963mHG in hypertensive participants. < 130/807mG in hypertensive participants with diabetes, heart failure or chronic kidney disease.;Long Term: Maintenance of blood  pressure at goal levels.    Lipids  Yes    Intervention  Provide education and support for participant on nutrition & aerobic/resistive exercise along with prescribed medications to achieve LDL <34m, HDL >428m    Expected Outcomes  Short Term: Participant states understanding of desired cholesterol values and is compliant with medications prescribed. Participant is following exercise prescription and nutrition guidelines.;Long Term: Cholesterol controlled with medications as prescribed, with individualized exercise RX and with personalized nutrition plan. Value goals: LDL < 7055mHDL > 40 mg.       Core Components/Risk Factors/Patient Goals Review:  Goals and Risk Factor Review    Row Name 06/07/17 1040 06/30/17 1110 07/26/17 1140         Core Components/Risk Factors/Patient Goals Review   Personal Goals Review  Weight Management/Obesity;Improve shortness of breath with ADL's;Lipids;Hypertension;Diabetes  Weight  Management/Obesity;Hypertension;Diabetes;Lipids  Weight Management/Obesity;Improve shortness of breath with ADL's;Diabetes;Hypertension;Lipids     Review  Melvin Brewer conme down about 5 pounds since the start of the program. Blood pressure has been under control. He does not mow the yard but is not limited to home activities asd chores. He takes his time and paces himself.   GerTerrick weighing in at 212. He went up a little recently, but is happy to be back down. He met with the Dietician and is incopoorating her suggestions to his diet. This has helped him cut down on his nightly snacking, which in turn is helping with his CBG reading. He also attended a three week class about diabetes which he reported helped his knowledge about this health issue tremendously.  He is compliant with his HTN and HLD medications. He does receive a call from his insurance nurse regarding his medications twice a year, and he suggested GerOmariequesting to be switched off of his atorvastatin since he is experiencing cramps at night.   GerCarmichaelw his Dr recently and his A1c was 5.7 down from 6.8 90 days ago.  He has been taking metformin and exercising.  BP and cholesterol are in normal range and he is taking meds as prescribed.  He has lost about 10 lb by cutting out potatoes, fries, white rice and limiting sweeta to special occasions.     Expected Outcomes  Short: continue attending [facility] and manage COPD. Long: maintain exercise routine post [facility] for Diabetes and breathing.  Short: converse with his doctor about his statin drug to see if there are other alternatives when he goes to his app 4/16, continue to attend [facility] for exercise and education. Long: Manage his diabetes successfuly and reach his weight goal.   Short - GerGrigorll complete LW program Long - GerEverettll maintain healthy habits on his own        Core Components/Risk Factors/Patient Goals at Discharge (Final Review):  Goals and Risk Factor Review -  07/26/17 1140      Core Components/Risk Factors/Patient Goals Review   Personal Goals Review  Weight Management/Obesity;Improve shortness of breath with ADL's;Diabetes;Hypertension;Lipids    Review  GerChaddrickw his Dr recently and his A1c was 5.7 down from 6.8 90 days ago.  He has been taking metformin and exercising.  BP and cholesterol are in normal range and he is taking meds as prescribed.  He has lost about 10 lb by cutting out potatoes, fries, white rice and limiting sweeta to special occasions.    Expected Outcomes  Short - GerKelcyll complete LW program Long - GerDwainell maintain healthy habits on his  own       ITP Comments: ITP Comments    Row Name 05/02/17 1043 05/02/17 1044 05/22/17 0906 06/19/17 0817 07/17/17 0822   ITP Comments  -  Medical Evaluation completed. Chart sent for review and changes to Dr. Emily Filbert Director of [place]. Diagnosis can be found in [facility] encounter 05/02/16  30 day review completed. ITP sent to Dr. Emily Filbert Director of [place]. Continue with ITP unless changes are made by physician.  30 day review completed. ITP sent to Dr. Emily Filbert Director of [place]. Continue with ITP unless changes are made by physician.   30 day review completed. ITP sent to Dr. Emily Filbert Director of [place]. Continue with ITP unless changes are made by physician   Row Name 07/28/17 0921 08/09/17 0946         ITP Comments  Cong called to let us know that he would be out today.  He is on day two of a bad stomach bug and hopes to return on Monday.   Discharge ITP sent and signed by Dr. Sabra Heck.  Discharge Summary routed to PCP and pulmonologist.         Comments: Discharge ITP

## 2017-08-09 NOTE — Progress Notes (Signed)
Discharge Progress Report  Patient Details  Name: Melvin Brewer MRN: [id number] Date of Birth: [birth year] Referring Provider:     Pulmonary Rehab from 05/02/2017 in [facility]  Referring Provider  Grandis       Number of Visits: 36/36  Reason for Discharge:  Patient reached a stable level of exercise. Patient independent in their exercise. Patient has met program and personal goals.  Smoking History:  Social History   Tobacco Use  Smoking Status Former Smoker  . Packs/day: 2.00  . Years: 43.00  . Pack years: 86.00  . Types: Cigarettes  . Last attempt to quit: 04/11/2004  . Years since quitting: 13.3  Smokeless Tobacco Never Used    Diagnosis:  Chronic obstructive pulmonary disease, unspecified COPD type ([place])  ADL [facility]: Pulmonary Assessment Scores    Row Name 05/02/17 1106 06/12/17 1126 07/31/17 1113     ADL [facility]SD   ADL Phase  Entry  Mid  Exit   SOB Score total  44  3  26   Rest  0  0  0   Walk  0  1  0   Stairs  _0 Bath  2  0  1   Dress  _1 Shop  _2 CAT Score   CAT Score  20  -  12     mMRC Score   mMRC Score  1  -  -      Initial Exercise Prescription: Initial Exercise Prescription - 05/02/17 1200      Date of Initial Exercise RX and Referring Provider   Date  05/02/17    Referring Provider  Grandis      Treadmill   MPH  2.5    Grade  1    Minutes  15      Recumbant Bike   Level  4    RPM  60    Watts  40    Minutes  15    METs  3.3      Elliptical   Level  1    Speed  2.5    Minutes  15      REL-XR   Level  3    Speed  50    Minutes  15    METs  3.3      Prescription Details   Frequency (times per week)  3    Duration  Progress to 45 minutes of aerobic exercise without signs/symptoms of physical distress      Intensity   THRR 40-80% of Max Heartrate  104-136    Ratings of Perceived Exertion  11-13    Perceived Dyspnea  0-4      Resistance Training   Training Prescription   Yes    Weight  4 lb    Reps  10-15       Discharge Exercise Prescription (Final Exercise Prescription Changes): Exercise Prescription Changes - 08/03/17 0900      Response to Exercise   Blood Pressure (Admit)  124/64    Blood Pressure (Exit)  124/60    Heart Rate (Admit)  62 bpm    Heart Rate (Exercise)  109 bpm    Heart Rate (Exit)  67 bpm    Oxygen Saturation (Admit)  95 %    Oxygen Saturation (Exercise)  91 %    Oxygen Saturation (Exit)  93 %  Rating of Perceived Exertion (Exercise)  15    Perceived Dyspnea (Exercise)  3    Duration  Continue with 45 min of aerobic exercise without signs/symptoms of physical distress.    Intensity  THRR unchanged      Progression   Progression  Continue to progress workloads to maintain intensity without signs/symptoms of physical distress.    Average METs  3.6      Resistance Training   Training Prescription  Yes    Weight  5 lb    Reps  10-15      Interval Training   Interval Training  No      REL-XR   Level  8    Minutes  15    METs  3.6      Home Exercise Plan   Plans to continue exercise at  [employer] (comment) Gym    Frequency  Add 1 additional day to program exercise sessions.       Functional Capacity: 6 Minute Walk    Row Name 05/02/17 1217 08/02/17 1044 08/07/17 1149     6 Minute Walk   Phase  -  Discharge  Discharge   Distance  1520 feet  1535 feet  1600 feet   Distance % Change  -  0.9 %  5.3 %   Distance Feet Change  -  15 ft  80 ft   Walk Time  6 minutes  5.77 minutes  6 minutes   # of Rest Breaks  0  1 14 sec  0   MPH  2.87  3.02  3.03   METS  3.3  3.58  3.32   RPE  _0 Perceived Dyspnea   _1 VO2 Peak  11.6  12.52  11.61   Symptoms  No  Yes (comment)  No   Comments  -  SOB  -   Resting HR  72 bpm  62 bpm  60 bpm   Resting BP  128/64  124/64  126/78   Resting Oxygen Saturation   97 %  95 %  97 %   Exercise Oxygen Saturation  during 6 min walk  90 %  89 %  91 %   Max Ex. HR   110 bpm  109 bpm  80 bpm   Max Ex. BP  134/78  156/86  150/82   2 Minute Post BP  128/72  154/60  -     Interval HR   1 Minute HR  101  -  65   2 Minute HR  106  -  75   3 Minute HR  103  -  80   4 Minute HR  -  104 rest break [id number]  - pulse ox battery failure: no data   5 Minute HR  110  109  - pulse ox battery failure: no data   6 Minute HR  100  104  78   2 Minute Post HR  -  81  -   Interval Heart Rate?  Yes  Yes  Yes     Interval Oxygen   Interval Oxygen?  Yes  -  Yes   Baseline Oxygen Saturation %  97 %  95 %  97 %   1 Minute Oxygen Saturation %  91 %  94 %  93 %   1 Minute Liters of Oxygen  0 L  0 L Room Air  0 L   2 Minute Oxygen Saturation %  90 %  93 %  91 %   2 Minute Liters of Oxygen  0 L  0 L  0 L   3 Minute Oxygen Saturation %  94 %  92 %  91 %   3 Minute Liters of Oxygen  0 L  0 L  0 L   4 Minute Oxygen Saturation %  -  91 %  - pulse ox battery failure: no data   4 Minute Liters of Oxygen  0 L  0 L  -   5 Minute Oxygen Saturation %  93 %  89 %  - pulse ox battery failure: no data   5 Minute Liters of Oxygen  0 L  0 L  -   6 Minute Oxygen Saturation %  94 %  91 %  96 %   6 Minute Liters of Oxygen  0 L  0 L  0 L   2 Minute Post Oxygen Saturation %  97 %  94 %  -   2 Minute Post Liters of Oxygen  0 L  0 L  -      Psychological, QOL, Others - Outcomes: PHQ 2/9: Depression screen [facility] 2/9 07/31/2017 06/19/2017 06/12/2017 05/15/2017 05/02/2017  Decreased Interest 0 0 0 0 0  Down, Depressed, Hopeless 0 0 0 0 0  PHQ - 2 Score 0 0 0 0 0  Altered sleeping 1 1 0 - 1  Tired, decreased energy 0 1 0 - 1  Change in appetite 0 - 0 - 0  Feeling bad or failure about yourself  0 - 0 - 0  Trouble concentrating 0 - 0 - 0  Moving slowly or fidgety/restless 0 - 0 - 0  Suicidal thoughts 0 - 0 - 0  PHQ-9 Score 1 2 0 - 2  Difficult doing work/chores Not difficult at all Not difficult at all Not difficult at all - Not difficult at all    Quality of Life:   Personal Goals: Goals  established at orientation with interventions provided to work toward goal. Personal Goals and Risk Factors at Admission - 05/02/17 1111      Core Components/Risk Factors/Patient Goals on Admission    Weight Management  Yes;Weight Loss    Intervention  Weight Management: Develop a combined nutrition and exercise program designed to reach desired caloric intake, while maintaining appropriate intake of nutrient and fiber, sodium and fats, and appropriate energy expenditure required for the weight goal.;Weight Management: Provide education and appropriate resources to help participant work on and attain dietary goals.;Obesity: Provide education and appropriate resources to help participant work on and attain dietary goals.    Admit Weight  219 lb 4.8 oz (99.5 kg)    Goal Weight: Short Term  214 lb (97.1 kg)    Goal Weight: Long Term  180 lb (81.6 kg)    Expected Outcomes  Short Term: Continue to assess and modify interventions until short term weight is achieved;Long Term: Adherence to nutrition and physical activity/exercise program aimed toward attainment of established weight goal;Weight Loss: Understanding of general recommendations for a balanced deficit meal plan, which promotes 1-2 lb weight loss per week and includes a negative energy balance of [phone] kcal/d;Understanding recommendations for meals to include 15-35% energy as protein, 25-35% energy from fat, 35-60% energy from carbohydrates, less than 210m of dietary cholesterol, 20-35 gm of total fiber daily;Understanding of distribution of calorie intake throughout the day with  the consumption of 4-5 meals/snacks    Improve shortness of breath with ADL's  Yes    Intervention  Provide education, individualized exercise plan and daily activity instruction to help decrease symptoms of SOB with activities of daily living.    Expected Outcomes  Short Term: Improve cardiorespiratory fitness to achieve a reduction of symptoms when performing ADLs;Long  Term: Be able to perform more ADLs without symptoms or delay the onset of symptoms    Diabetes  Yes pre diabetic    Intervention  Provide education about signs/symptoms and action to take for hypo/hyperglycemia.;Provide education about proper nutrition, including hydration, and aerobic/resistive exercise prescription along with prescribed medications to achieve blood glucose in normal ranges: Fasting glucose 65-99 mg/dL    Expected Outcomes  Short Term: Participant verbalizes understanding of the signs/symptoms and immediate care of hyper/hypoglycemia, proper foot care and importance of medication, aerobic/resistive exercise and nutrition plan for blood glucose control.;Long Term: Attainment of HbA1C < 7%.    Hypertension  Yes    Intervention  Provide education on lifestyle modifcations including regular physical activity/exercise, weight management, moderate sodium restriction and increased consumption of fresh fruit, vegetables, and low fat dairy, alcohol moderation, and smoking cessation.;Monitor prescription use compliance.    Expected Outcomes  Short Term: Continued assessment and intervention until BP is < 140/31m HG in hypertensive participants. < 130/877mHG in hypertensive participants with diabetes, heart failure or chronic kidney disease.;Long Term: Maintenance of blood pressure at goal levels.    Lipids  Yes    Intervention  Provide education and support for participant on nutrition & aerobic/resistive exercise along with prescribed medications to achieve LDL <7010mHDL >74m3m  Expected Outcomes  Short Term: Participant states understanding of desired cholesterol values and is compliant with medications prescribed. Participant is following exercise prescription and nutrition guidelines.;Long Term: Cholesterol controlled with medications as prescribed, with individualized exercise RX and with personalized nutrition plan. Value goals: LDL < 70mg11mL > 40 mg.        Personal Goals  Discharge: Goals and Risk Factor Review    Row Name 06/07/17 1040 06/30/17 1110 07/26/17 1140         Core Components/Risk Factors/Patient Goals Review   Personal Goals Review  Weight Management/Obesity;Improve shortness of breath with ADL's;Lipids;Hypertension;Diabetes  Weight Management/Obesity;Hypertension;Diabetes;Lipids  Weight Management/Obesity;Improve shortness of breath with ADL's;Diabetes;Hypertension;Lipids     Review  Melvin Brewer down about 5 pounds since the start of the program. Blood pressure has been under control. He does not mow the yard but is not limited to home activities asd chores. He takes his time and paces himself.   Melvin Brewer in at 212. He went up a little recently, but is happy to be back down. He met with the Dietician and is incopoorating her suggestions to his diet. This has helped him cut down on his nightly snacking, which in turn is helping with his CBG reading. He also attended a three week class about diabetes which he reported helped his knowledge about this health issue tremendously.  He is compliant with his HTN and HLD medications. He does receive a call from his insurance nurse regarding his medications twice a year, and he suggested Melvin Brewer to be switched off of his atorvastatin since he is experiencing cramps at night.   Melvin Brewer Dr recently and his A1c was 5.7 down from 6.8 90 days ago.  He has been taking metformin and exercising.  BP and cholesterol are in normal  range and he is taking meds as prescribed.  He has lost about 10 lb by cutting out potatoes, fries, white rice and limiting sweeta to special occasions.     Expected Outcomes  Short: continue attending [facility] and manage COPD. Long: maintain exercise routine post [facility] for Diabetes and breathing.  Short: converse with his doctor about his statin drug to see if there are other alternatives when he goes to his app 4/16, continue to attend [facility] for exercise and  education. Long: Manage his diabetes successfuly and reach his weight goal.   Short - Melvin Brewer will complete LW program Long - Melvin Brewer will maintain healthy habits on his own        Exercise Goals and Review: Exercise Goals    Row Name 05/02/17 1216             Exercise Goals   Increase Physical Activity  Yes       Intervention  Provide advice, education, support and counseling about physical activity/exercise needs.;Develop an individualized exercise prescription for aerobic and resistive training based on initial evaluation findings, risk stratification, comorbidities and participant's personal goals.       Expected Outcomes  Short Term: Attend rehab on a regular basis to increase amount of physical activity.;Long Term: Add in home exercise to make exercise part of routine and to increase amount of physical activity.;Long Term: Exercising regularly at least 3-5 days a week.       Increase Strength and Stamina  Yes       Intervention  Provide advice, education, support and counseling about physical activity/exercise needs.;Develop an individualized exercise prescription for aerobic and resistive training based on initial evaluation findings, risk stratification, comorbidities and participant's personal goals.       Expected Outcomes  Short Term: Increase workloads from initial exercise prescription for resistance, speed, and METs.;Short Term: Perform resistance training exercises routinely during rehab and add in resistance training at home;Long Term: Improve cardiorespiratory fitness, muscular endurance and strength as measured by increased METs and functional capacity (6MWT)       Able to understand and use rate of perceived exertion (RPE) scale  Yes       Intervention  Provide education and explanation on how to use RPE scale       Expected Outcomes  Short Term: Able to use RPE daily in rehab to express subjective intensity level;Long Term:  Able to use RPE to guide intensity level when  exercising independently       Able to understand and use Dyspnea scale  Yes       Intervention  Provide education and explanation on how to use Dyspnea scale       Expected Outcomes  Short Term: Able to use Dyspnea scale daily in rehab to express subjective sense of shortness of breath during exertion;Long Term: Able to use Dyspnea scale to guide intensity level when exercising independently       Knowledge and understanding of Target Heart Rate Range (THRR)  Yes       Intervention  Provide education and explanation of THRR including how the numbers were predicted and where they are located for reference       Expected Outcomes  Short Term: Able to state/look up THRR;Long Term: Able to use THRR to govern intensity when exercising independently;Short Term: Able to use daily as guideline for intensity in rehab       Able to check pulse independently  Yes       Intervention  Provide education and demonstration on how to check pulse in carotid and radial arteries.;Review the importance of being able to check your own pulse for safety during independent exercise       Expected Outcomes  Short Term: Able to explain why pulse checking is important during independent exercise;Long Term: Able to check pulse independently and accurately       Understanding of Exercise Prescription  Yes       Intervention  Provide education, explanation, and written materials on patient's individual exercise prescription       Expected Outcomes  Short Term: Able to explain program exercise prescription;Long Term: Able to explain home exercise prescription to exercise independently          Nutrition & Weight - Outcomes: Pre Biometrics - 05/02/17 1216      Pre Biometrics   Height  _0  (1.778 m)    Weight  219 lb 9.6 oz (99.6 kg)    Waist Circumference  43 inches    Hip Circumference  43 inches    Waist to Hip Ratio  1 %    BMI (Calculated)  31.51      Post Biometrics - 08/02/17 1049       Post  Biometrics    Height  _1  (1.778 m)    Weight  211 lb 6.4 oz (95.9 kg)    Waist Circumference  42 inches    Hip Circumference  39.5 inches    Waist to Hip Ratio  1.06 %    BMI (Calculated)  30.33       Nutrition: Nutrition Therapy & Goals - 05/02/17 1105      Personal Nutrition Goals   Comments  He would like to lose weight and eat healthier. He would also like to meet with the dietician.      Intervention Plan   Intervention  Prescribe, educate and counsel regarding individualized specific dietary modifications aiming towards targeted core components such as weight, hypertension, lipid management, diabetes, heart failure and other comorbidities.;Nutrition handout(s) given to patient.    Expected Outcomes  Long Term Goal: Adherence to prescribed nutrition plan.;Short Term Goal: Understand basic principles of dietary content, such as calories, fat, sodium, cholesterol and nutrients.       Nutrition Discharge: Nutrition Assessments - 05/02/17 1126      MEDFICTS Scores   Pre Score  22       Education Questionnaire Score: Knowledge Questionnaire Score - 07/31/17 1114      Knowledge Questionnaire Score   Pre Score  14/18    Post Score  17/18 reviewed with patient       Goals reviewed with patient; copy given to patient.

## 2017-08-09 NOTE — Patient Instructions (Signed)
Discharge Patient Instructions  Patient Details  Name: Melvin Brewer MRN: [id number] Date of Birth: 1951-01-28 Referring Provider:  Hortencia Pilar, MD   Number of Visits: 36/36  Reason for Discharge:  Patient reached a stable level of exercise. Patient independent in their exercise. Patient has met program and personal goals.  Smoking History:  Social History   Tobacco Use  Smoking Status Former Smoker  . Packs/day: 2.00  . Years: 43.00  . Pack years: 86.00  . Types: Cigarettes  . Last attempt to quit: 04/11/2004  . Years since quitting: 13.3  Smokeless Tobacco Never Used    Diagnosis:  Chronic obstructive pulmonary disease, unspecified COPD type ([place])  Initial Exercise Prescription: Initial Exercise Prescription - 05/02/17 1200      Date of Initial Exercise RX and Referring Provider   Date  05/02/17    Referring Provider  [facility]      Treadmill   MPH  2.5    Grade  1    Minutes  15      Recumbant Bike   Level  4    RPM  60    Watts  40    Minutes  15    METs  3.3      Elliptical   Level  1    Speed  2.5    Minutes  15      REL-XR   Level  3    Speed  50    Minutes  15    METs  3.3      Prescription Details   Frequency (times per week)  3    Duration  Progress to 45 minutes of aerobic exercise without signs/symptoms of physical distress      Intensity   THRR 40-80% of Max Heartrate  104-136    Ratings of Perceived Exertion  11-13    Perceived Dyspnea  0-4      Resistance Training   Training Prescription  Yes    Weight  4 lb    Reps  10-15       Discharge Exercise Prescription (Final Exercise Prescription Changes): Exercise Prescription Changes - 08/03/17 0900      Response to Exercise   Blood Pressure (Admit)  124/64    Blood Pressure (Exit)  124/60    Heart Rate (Admit)  62 bpm    Heart Rate (Exercise)  109 bpm    Heart Rate (Exit)  67 bpm    Oxygen Saturation (Admit)  95 %    Oxygen Saturation (Exercise)  91 %    Oxygen  Saturation (Exit)  93 %    Rating of Perceived Exertion (Exercise)  15    Perceived Dyspnea (Exercise)  3    Duration  Continue with 45 min of aerobic exercise without signs/symptoms of physical distress.    Intensity  THRR unchanged      Progression   Progression  Continue to progress workloads to maintain intensity without signs/symptoms of physical distress.    Average METs  3.6      Resistance Training   Training Prescription  Yes    Weight  5 lb    Reps  10-15      Interval Training   Interval Training  No      REL-XR   Level  8    Minutes  15    METs  3.6      Home Exercise Plan   Plans to continue exercise at  [facility] (comment)  Gym    Frequency  Add 1 additional day to program exercise sessions.       Functional Capacity: 6 Minute Walk    Row Name 05/02/17 1217 08/02/17 1044 08/07/17 1149     6 Minute Walk   Phase  -  Discharge  Discharge   Distance  1520 feet  1535 feet  1600 feet   Distance % Change  -  0.9 %  5.3 %   Distance Feet Change  -  15 ft  80 ft   Walk Time  6 minutes  5.77 minutes  6 minutes   # of Rest Breaks  0  1 14 sec  0   MPH  2.87  3.02  3.03   METS  3.3  3.58  3.32   RPE  15  15  12    Perceived Dyspnea   2  3  1    VO2 Peak  11.6  12.52  11.61   Symptoms  No  Yes (comment)  No   Comments  -  SOB  -   Resting HR  72 bpm  62 bpm  60 bpm   Resting BP  128/64  124/64  126/78   Resting Oxygen Saturation   97 %  95 %  97 %   Exercise Oxygen Saturation  during 6 min walk  90 %  89 %  91 %   Max Ex. HR  110 bpm  109 bpm  80 bpm   Max Ex. BP  134/78  156/86  150/82   2 Minute Post BP  128/72  154/60  -     Interval HR   1 Minute HR  101  -  65   2 Minute HR  106  -  75   3 Minute HR  103  -  80   4 Minute HR  -  104 rest break [id number]  - pulse ox battery failure: no data   5 Minute HR  110  109  - pulse ox battery failure: no data   6 Minute HR  100  104  78   2 Minute Post HR  -  81  -   Interval Heart Rate?  Yes  Yes  Yes      Interval Oxygen   Interval Oxygen?  Yes  -  Yes   Baseline Oxygen Saturation %  97 %  95 %  97 %   1 Minute Oxygen Saturation %  91 %  94 %  93 %   1 Minute Liters of Oxygen  0 L  0 L Room Air  0 L   2 Minute Oxygen Saturation %  90 %  93 %  91 %   2 Minute Liters of Oxygen  0 L  0 L  0 L   3 Minute Oxygen Saturation %  94 %  92 %  91 %   3 Minute Liters of Oxygen  0 L  0 L  0 L   4 Minute Oxygen Saturation %  -  91 %  - pulse ox battery failure: no data   4 Minute Liters of Oxygen  0 L  0 L  -   5 Minute Oxygen Saturation %  93 %  89 %  - pulse ox battery failure: no data   5 Minute Liters of Oxygen  0 L  0 L  -   6 Minute Oxygen Saturation %  94 %  91 %  96 %   6 Minute Liters of Oxygen  0 L  0 L  0 L   2 Minute Post Oxygen Saturation %  97 %  94 %  -   2 Minute Post Liters of Oxygen  0 L  0 L  -      Quality of Life:   Personal Goals: Goals established at orientation with interventions provided to work toward goal. Personal Goals and Risk Factors at Admission - 05/02/17 1111      Core Components/Risk Factors/Patient Goals on Admission    Weight Management  Yes;Weight Loss    Intervention  Weight Management: Develop a combined nutrition and exercise program designed to reach desired caloric intake, while maintaining appropriate intake of nutrient and fiber, sodium and fats, and appropriate energy expenditure required for the weight goal.;Weight Management: Provide education and appropriate resources to help participant work on and attain dietary goals.;Obesity: Provide education and appropriate resources to help participant work on and attain dietary goals.    Admit Weight  219 lb 4.8 oz (99.5 kg)    Goal Weight: Short Term  214 lb (97.1 kg)    Goal Weight: Long Term  180 lb (81.6 kg)    Expected Outcomes  Short Term: Continue to assess and modify interventions until short term weight is achieved;Long Term: Adherence to nutrition and physical activity/exercise program aimed  toward attainment of established weight goal;Weight Loss: Understanding of general recommendations for a balanced deficit meal plan, which promotes 1-2 lb weight loss per week and includes a negative energy balance of [id number] kcal/d;Understanding recommendations for meals to include 15-35% energy as protein, 25-35% energy from fat, 35-60% energy from carbohydrates, less than 223m of dietary cholesterol, 20-35 gm of total fiber daily;Understanding of distribution of calorie intake throughout the day with the consumption of 4-5 meals/snacks    Improve shortness of breath with ADL's  Yes    Intervention  Provide education, individualized exercise plan and daily activity instruction to help decrease symptoms of SOB with activities of daily living.    Expected Outcomes  Short Term: Improve cardiorespiratory fitness to achieve a reduction of symptoms when performing ADLs;Long Term: Be able to perform more ADLs without symptoms or delay the onset of symptoms    Diabetes  Yes pre diabetic    Intervention  Provide education about signs/symptoms and action to take for hypo/hyperglycemia.;Provide education about proper nutrition, including hydration, and aerobic/resistive exercise prescription along with prescribed medications to achieve blood glucose in normal ranges: Fasting glucose 65-99 mg/dL    Expected Outcomes  Short Term: Participant verbalizes understanding of the signs/symptoms and immediate care of hyper/hypoglycemia, proper foot care and importance of medication, aerobic/resistive exercise and nutrition plan for blood glucose control.;Long Term: Attainment of HbA1C < 7%.    Hypertension  Yes    Intervention  Provide education on lifestyle modifcations including regular physical activity/exercise, weight management, moderate sodium restriction and increased consumption of fresh fruit, vegetables, and low fat dairy, alcohol moderation, and smoking cessation.;Monitor prescription use compliance.    Expected  Outcomes  Short Term: Continued assessment and intervention until BP is < 140/923mHG in hypertensive participants. < 130/8041mG in hypertensive participants with diabetes, heart failure or chronic kidney disease.;Long Term: Maintenance of blood pressure at goal levels.    Lipids  Yes    Intervention  Provide education and support for participant on nutrition & aerobic/resistive exercise along with prescribed medications to achieve LDL <47m20mDL >40mg13m  Expected Outcomes  Short Term: Participant states understanding of desired cholesterol values and is compliant with medications prescribed. Participant is following exercise prescription and nutrition guidelines.;Long Term: Cholesterol controlled with medications as prescribed, with individualized exercise RX and with personalized nutrition plan. Value goals: LDL < '70mg'$ , HDL > 40 mg.        Personal Goals Discharge: Goals and Risk Factor Review - 07/26/17 1140      Core Components/Risk Factors/Patient Goals Review   Personal Goals Review  Weight Management/Obesity;Improve shortness of breath with ADL's;Diabetes;Hypertension;Lipids    Review  Melvin Brewer saw his Dr recently and his A1c was 5.7 down from 6.8 90 days ago.  He has been taking metformin and exercising.  BP and cholesterol are in normal range and he is taking meds as prescribed.  He has lost about 10 lb by cutting out potatoes, fries, white rice and limiting sweeta to special occasions.    Expected Outcomes  Short - Melvin Brewer will complete LW program Long - Melvin Brewer will maintain healthy habits on his own       Exercise Goals and Review: Exercise Goals    Row Name 05/02/17 1216             Exercise Goals   Increase Physical Activity  Yes       Intervention  Provide advice, education, support and counseling about physical activity/exercise needs.;Develop an individualized exercise prescription for aerobic and resistive training based on initial evaluation findings, risk stratification,  comorbidities and participant's personal goals.       Expected Outcomes  Short Term: Attend rehab on a regular basis to increase amount of physical activity.;Long Term: Add in home exercise to make exercise part of routine and to increase amount of physical activity.;Long Term: Exercising regularly at least 3-5 days a week.       Increase Strength and Stamina  Yes       Intervention  Provide advice, education, support and counseling about physical activity/exercise needs.;Develop an individualized exercise prescription for aerobic and resistive training based on initial evaluation findings, risk stratification, comorbidities and participant's personal goals.       Expected Outcomes  Short Term: Increase workloads from initial exercise prescription for resistance, speed, and METs.;Short Term: Perform resistance training exercises routinely during rehab and add in resistance training at home;Long Term: Improve cardiorespiratory fitness, muscular endurance and strength as measured by increased METs and functional capacity (6MWT)       Able to understand and use rate of perceived exertion (RPE) scale  Yes       Intervention  Provide education and explanation on how to use RPE scale       Expected Outcomes  Short Term: Able to use RPE daily in rehab to express subjective intensity level;Long Term:  Able to use RPE to guide intensity level when exercising independently       Able to understand and use Dyspnea scale  Yes       Intervention  Provide education and explanation on how to use Dyspnea scale       Expected Outcomes  Short Term: Able to use Dyspnea scale daily in rehab to express subjective sense of shortness of breath during exertion;Long Term: Able to use Dyspnea scale to guide intensity level when exercising independently       Knowledge and understanding of Target Heart Rate Range (THRR)  Yes       Intervention  Provide education and explanation of THRR including how the numbers were predicted and  where they are located for reference       Expected Outcomes  Short Term: Able to state/look up THRR;Long Term: Able to use THRR to govern intensity when exercising independently;Short Term: Able to use daily as guideline for intensity in rehab       Able to check pulse independently  Yes       Intervention  Provide education and demonstration on how to check pulse in carotid and radial arteries.;Review the importance of being able to check your own pulse for safety during independent exercise       Expected Outcomes  Short Term: Able to explain why pulse checking is important during independent exercise;Long Term: Able to check pulse independently and accurately       Understanding of Exercise Prescription  Yes       Intervention  Provide education, explanation, and written materials on patient's individual exercise prescription       Expected Outcomes  Short Term: Able to explain program exercise prescription;Long Term: Able to explain home exercise prescription to exercise independently          Nutrition & Weight - Outcomes: Pre Biometrics - 05/02/17 1216      Pre Biometrics   Height  5' 10"  (1.778 m)    Weight  219 lb 9.6 oz (99.6 kg)    Waist Circumference  43 inches    Hip Circumference  43 inches    Waist to Hip Ratio  1 %    BMI (Calculated)  31.51      Post Biometrics - 08/02/17 1049       Post  Biometrics   Height  5' 10"  (1.778 m)    Weight  211 lb 6.4 oz (95.9 kg)    Waist Circumference  42 inches    Hip Circumference  39.5 inches    Waist to Hip Ratio  1.06 %    BMI (Calculated)  30.33       Nutrition: Nutrition Therapy & Goals - 05/02/17 1105      Personal Nutrition Goals   Comments  He would like to lose weight and eat healthier. He would also like to meet with the dietician.      Intervention Plan   Intervention  Prescribe, educate and counsel regarding individualized specific dietary modifications aiming towards targeted core components such as weight,  hypertension, lipid management, diabetes, heart failure and other comorbidities.;Nutrition handout(s) given to patient.    Expected Outcomes  Long Term Goal: Adherence to prescribed nutrition plan.;Short Term Goal: Understand basic principles of dietary content, such as calories, fat, sodium, cholesterol and nutrients.       Nutrition Discharge: Nutrition Assessments - 05/02/17 1126      MEDFICTS Scores   Pre Score  22       Education Questionnaire Score: Knowledge Questionnaire Score - 07/31/17 1114      Knowledge Questionnaire Score   Pre Score  14/18    Post Score  17/18 reviewed with patient       Goals reviewed with patient; copy given to patient.

## 2017-08-09 NOTE — Progress Notes (Signed)
Daily Session Note  Patient Details  Name: SONNIE PAWLOSKI MRN: [id number] Date of Birth: 1951-02-18 Referring Provider:     Pulmonary Rehab from 05/02/2017 in [facility]  Referring Provider  [facility]is      Encounter Date: 08/09/2017  Check In: Session Check In - 08/09/17 0944      Check-In   Location  [facility]-Cardiac & Pulmonary Rehab    Staff Present  Justin Mend RCP,RRT,BSRT;Amanda Oletta Darter, BA, ACSM CEP, Exercise Physiologist;Jessica Luan Pulling, MA, RCEP, CCRP, Exercise Physiologist    Supervising physician immediately available to respond to emergencies  [facility] immediately available ER MD    Physician(s)  Dr. Alfred Levins and Burlene Arnt    Medication changes reported      No    Fall or balance concerns reported     No    Tobacco Cessation  No Change    Warm-up and Cool-down  Performed as group-led instruction    Resistance Training Performed  Yes    VAD Patient?  No      Pain Assessment   Currently in Pain?  No/denies          Social History   Tobacco Use  Smoking Status Former Smoker  . Packs/day: 2.00  . Years: 43.00  . Pack years: 86.00  . Types: Cigarettes  . Last attempt to quit: 04/11/2004  . Years since quitting: 13.3  Smokeless Tobacco Never Used    Goals Met:  Proper associated with RPD/PD & O2 Sat Independence with exercise equipment Improved SOB with ADL's Using PLB without cueing & demonstrates good technique Exercise tolerated well No report of cardiac concerns or symptoms Strength training completed today  Goals Unmet:  Not Applicable  Comments:  Jazper graduated today from  rehab with 36 sessions completed.  Details of the patient's exercise prescription and what He needs to do in order to continue the prescription and progress were discussed with patient.  Patient was given a copy of prescription and goals.  Patient verbalized understanding.  Rahkim plans to continue to exercise by walking and exercising at home.   Dr. Emily Filbert is Medical Director for [facility] and [facility].

## 2017-12-25 ENCOUNTER — Encounter: Payer: Self-pay | Admitting: Student

## 2017-12-26 ENCOUNTER — Ambulatory Visit: Payer: Medicare Other | Admitting: Certified Registered Nurse Anesthetist

## 2017-12-26 ENCOUNTER — Ambulatory Visit
Admission: RE | Admit: 2017-12-26 | Discharge: 2017-12-26 | Disposition: A | Discharge status: Home / Self Care | Payer: Medicare Other | Source: Ambulatory Visit | Attending: Gastroenterology | Admitting: Gastroenterology

## 2017-12-26 ENCOUNTER — Encounter: Payer: Self-pay | Admitting: *Deleted

## 2017-12-26 ENCOUNTER — Encounter
Admission: RE | Disposition: A | Discharge status: Home / Self Care | Payer: Self-pay | Source: Ambulatory Visit | Attending: Gastroenterology

## 2017-12-26 DIAGNOSIS — I2510 Atherosclerotic heart disease of native coronary artery without angina pectoris: Secondary | ICD-10-CM | POA: Diagnosis not present

## 2017-12-26 DIAGNOSIS — J449 Chronic obstructive pulmonary disease, unspecified: Secondary | ICD-10-CM | POA: Diagnosis not present

## 2017-12-26 DIAGNOSIS — K219 Gastro-esophageal reflux disease without esophagitis: Secondary | ICD-10-CM | POA: Insufficient documentation

## 2017-12-26 DIAGNOSIS — Z951 Presence of aortocoronary bypass graft: Secondary | ICD-10-CM | POA: Diagnosis not present

## 2017-12-26 DIAGNOSIS — K5730 Diverticulosis of large intestine without perforation or abscess without bleeding: Secondary | ICD-10-CM | POA: Diagnosis not present

## 2017-12-26 DIAGNOSIS — Z79899 Other long term (current) drug therapy: Secondary | ICD-10-CM | POA: Insufficient documentation

## 2017-12-26 DIAGNOSIS — K621 Rectal polyp: Secondary | ICD-10-CM | POA: Insufficient documentation

## 2017-12-26 DIAGNOSIS — Z86010 Personal history of colonic polyps: Secondary | ICD-10-CM | POA: Diagnosis not present

## 2017-12-26 DIAGNOSIS — K635 Polyp of colon: Secondary | ICD-10-CM | POA: Diagnosis not present

## 2017-12-26 DIAGNOSIS — I714 Abdominal aortic aneurysm, without rupture: Secondary | ICD-10-CM | POA: Diagnosis not present

## 2017-12-26 DIAGNOSIS — Z1211 Encounter for screening for malignant neoplasm of colon: Secondary | ICD-10-CM | POA: Diagnosis not present

## 2017-12-26 DIAGNOSIS — I10 Essential (primary) hypertension: Secondary | ICD-10-CM | POA: Diagnosis not present

## 2017-12-26 DIAGNOSIS — Z7984 Long term (current) use of oral hypoglycemic drugs: Secondary | ICD-10-CM | POA: Diagnosis not present

## 2017-12-26 DIAGNOSIS — E7800 Pure hypercholesterolemia, unspecified: Secondary | ICD-10-CM | POA: Diagnosis not present

## 2017-12-26 DIAGNOSIS — D124 Benign neoplasm of descending colon: Secondary | ICD-10-CM | POA: Diagnosis not present

## 2017-12-26 DIAGNOSIS — E1151 Type 2 diabetes mellitus with diabetic peripheral angiopathy without gangrene: Secondary | ICD-10-CM | POA: Diagnosis not present

## 2017-12-26 DIAGNOSIS — Z7982 Long term (current) use of aspirin: Secondary | ICD-10-CM | POA: Insufficient documentation

## 2017-12-26 HISTORY — DX: Spondylosis without myelopathy or radiculopathy, cervical region: M47.812

## 2017-12-26 HISTORY — DX: Allergic rhinitis, unspecified: J30.9

## 2017-12-26 HISTORY — DX: Abdominal aortic aneurysm, without rupture, unspecified: I71.40

## 2017-12-26 HISTORY — DX: Hyperglycemia, unspecified: R73.9

## 2017-12-26 HISTORY — DX: Angina pectoris, unspecified: I20.9

## 2017-12-26 HISTORY — DX: Diverticulitis of intestine, part unspecified, without perforation or abscess without bleeding: K57.92

## 2017-12-26 HISTORY — DX: Abdominal aortic aneurysm, without rupture: I71.4

## 2017-12-26 HISTORY — DX: Gastro-esophageal reflux disease without esophagitis: K21.9

## 2017-12-26 HISTORY — DX: Unspecified osteoarthritis, unspecified site: M19.90

## 2017-12-26 HISTORY — DX: Obesity, unspecified: E66.9

## 2017-12-26 HISTORY — DX: Unspecified hemorrhoids: K64.9

## 2017-12-26 HISTORY — DX: Centrilobular emphysema: J43.2

## 2017-12-26 HISTORY — DX: Pure hypercholesterolemia, unspecified: E78.00

## 2017-12-26 HISTORY — DX: Herpesviral infection, unspecified: B00.9

## 2017-12-26 HISTORY — DX: Type 2 diabetes mellitus without complications: E11.9

## 2017-12-26 HISTORY — DX: Dermatitis, unspecified: L30.9

## 2017-12-26 HISTORY — PX: COLONOSCOPY WITH PROPOFOL: SHX5780

## 2017-12-26 LAB — GLUCOSE, CAPILLARY: Glucose-Capillary: 94 mg/dL (ref 70–99)

## 2017-12-26 SURGERY — COLONOSCOPY WITH PROPOFOL
Anesthesia: General

## 2017-12-26 MED ORDER — SODIUM CHLORIDE 0.9 % IV SOLN
INTRAVENOUS | Status: DC
  Administered 2017-12-26: 1000 mL via INTRAVENOUS

## 2017-12-26 MED ORDER — LIDOCAINE HCL (PF) 2 % IJ SOLN
INTRAMUSCULAR | Status: AC
  Filled 2017-12-26: qty 10

## 2017-12-26 MED ORDER — PROPOFOL 10 MG/ML IV BOLUS
INTRAVENOUS | Status: DC | PRN
  Administered 2017-12-26: 100 mg via INTRAVENOUS

## 2017-12-26 MED ORDER — MIDAZOLAM HCL 2 MG/2ML IJ SOLN
INTRAMUSCULAR | Status: DC | PRN
  Administered 2017-12-26: 2 mg via INTRAVENOUS

## 2017-12-26 MED ORDER — LIDOCAINE HCL (CARDIAC) PF 100 MG/5ML IV SOSY
PREFILLED_SYRINGE | INTRAVENOUS | Status: DC | PRN
  Administered 2017-12-26: 50 mg via INTRAVENOUS

## 2017-12-26 MED ORDER — PROPOFOL 500 MG/50ML IV EMUL
INTRAVENOUS | Status: DC | PRN
  Administered 2017-12-26: 130 ug/kg/min via INTRAVENOUS

## 2017-12-26 MED ORDER — LIDOCAINE HCL (PF) 1 % IJ SOLN
INTRAMUSCULAR | Status: AC
  Administered 2017-12-26: 0.3 mL via INTRADERMAL
  Filled 2017-12-26: qty 2

## 2017-12-26 MED ORDER — LIDOCAINE HCL (PF) 1 % IJ SOLN
2.0000 mL | Freq: Once | INTRAMUSCULAR | Status: AC
  Administered 2017-12-26: 0.3 mL via INTRADERMAL

## 2017-12-26 MED ORDER — MIDAZOLAM HCL 2 MG/2ML IJ SOLN
INTRAMUSCULAR | Status: AC
  Filled 2017-12-26: qty 2

## 2017-12-26 MED ORDER — PROPOFOL 500 MG/50ML IV EMUL
INTRAVENOUS | Status: AC
  Filled 2017-12-26: qty 50

## 2017-12-26 NOTE — Anesthesia Preprocedure Evaluation (Signed)
Anesthesia Evaluation  Patient identified by MRN, date of birth, ID band Patient awake    Reviewed: Allergy & Precautions, H&P , NPO status , Patient's Chart, lab work & pertinent test results  History of Anesthesia Complications Negative for: history of anesthetic complications  Airway Mallampati: III  TM Distance: <3 FB Neck ROM: full    Dental  (+) Chipped, Poor Dentition, Missing   Pulmonary shortness of breath and with exertion, COPD, former smoker,           Cardiovascular Exercise Tolerance: Good hypertension, (-) angina+ CAD, + CABG and + Peripheral Vascular Disease  (-) Past MI      Neuro/Psych negative neurological ROS  negative psych ROS   GI/Hepatic negative GI ROS, Neg liver ROS, hiatal hernia, GERD  Medicated and Controlled,  Endo/Other  diabetes, Type 2, Oral Hypoglycemic Agents  Renal/GU negative Renal ROS  negative genitourinary   Musculoskeletal  (+) Arthritis ,   Abdominal   Peds  Hematology negative hematology ROS (+)   Anesthesia Other Findings Past Medical History: No date: AAA (abdominal aortic aneurysm) ([facility]) No date: Abdominal aortic aneurysm (AAA) ([place]) No date: Allergic rhinitis No date: Anginal pain ([place]) No date: Centrilobular emphysema (H[facility]) No date: Cervical spine degeneration No date: COPD (chronic obstructive pulmonary disease) ([facility]) No date: Coronary artery disease No date: Diabetes mellitus without complication ([facility]) No date: Diverticulitis No date: DJD (degenerative joint disease) No date: Eczema No date: GERD (gastroesophageal reflux disease) No date: H/O hiatal hernia     Comment:  AGE 67 S  NO MEDS No date: Hemorrhoids No date: Herpes simplex infection No date: Hypercholesterolemia No date: Hyperglycemia No date: Hyperlipidemia No date: Hypertension No date: Obesity No date: Shortness of breath     Comment:  W/ EXERTION   Past Surgical History: 06/20/2013:  ANTERIOR CERVICAL DECOMP/DISCECTOMY FUSION; N/A     Comment:  Procedure: CERVCIAL FIVE-SIX,CERVICAL SIX-SEVEN ANTERIOR              CERVICAL DECOMPRESSION WITH FUSION INTERBODY PROSTHESIS               PLATING AND PEEK CAGE;  Surgeon: Ophelia Charter, MD;                Location: [facility];  Service: Neurosurgery;                Laterality: N/A;  anterior No date: CARDIAC CATHETERIZATION     Comment:  [place]  2007  No date: CORONARY ARTERY BYPASS GRAFT     Comment:  2007  _0  No date: EYE SURGERY     Comment:  RT EYE LASER (RET DETACHMENT) No date: TONSILLECTOMY  BMI    Body Mass Index:  31.01 kg/m      Reproductive/Obstetrics negative OB ROS                             Anesthesia Physical Anesthesia Plan  ASA: III  Anesthesia Plan: General   Post-op Pain Management:    Induction: Intravenous  PONV Risk Score and Plan: Propofol infusion and TIVA  Airway Management Planned: Natural Airway and Nasal Cannula  Additional Equipment:   Intra-op Plan:   Post-operative Plan:   Informed Consent: I have reviewed the patients History and Physical, chart, labs and discussed the procedure including the risks, benefits and alternatives for the proposed anesthesia with the patient or authorized representative who has indicated his/her understanding and acceptance.  Dental Advisory Given  Plan Discussed with: Anesthesiologist, CRNA and Surgeon  Anesthesia Plan Comments: (Patient consented for risks of anesthesia including but not limited to:  - adverse reactions to medications - risk of intubation if required - damage to teeth, lips or other oral mucosa - sore throat or hoarseness - Damage to heart, brain, lungs or loss of life  Patient voiced understanding.)        Anesthesia Quick Evaluation

## 2017-12-26 NOTE — Transfer of Care (Signed)
Immediate Anesthesia Transfer of Care Note  Patient: Melvin Brewer  Procedure(s) Performed: COLONOSCOPY WITH PROPOFOL (N/A )  Patient Location: PACU and Endoscopy Unit  Anesthesia Type:General  Level of Consciousness: drowsy  Airway & Oxygen Therapy: Patient Spontanous Breathing and Patient connected to nasal cannula oxygen  Post-op Assessment: Report given to RN and Post -op Vital signs reviewed and stable  Post vital signs: Reviewed and stable  Last Vitals:  Vitals Value Taken Time  BP    Temp    Pulse    Resp    SpO2      Last Pain:  Vitals:   12/26/17 0658  PainSc: 0-No pain         Complications: No apparent anesthesia complications

## 2017-12-26 NOTE — Anesthesia Postprocedure Evaluation (Signed)
Anesthesia Post Note  Patient: Melvin Brewer  Procedure(s) Performed: COLONOSCOPY WITH PROPOFOL (N/A )  Patient location during evaluation: Endoscopy Anesthesia Type: General Level of consciousness: awake and alert Pain management: pain level controlled Vital Signs Assessment: post-procedure vital signs reviewed and stable Respiratory status: spontaneous breathing, nonlabored ventilation, respiratory function stable and patient connected to nasal cannula oxygen Cardiovascular status: blood pressure returned to baseline and stable Postop Assessment: no apparent nausea or vomiting Anesthetic complications: no     Last Vitals:  Vitals:   12/26/17 0825 12/26/17 0835  BP: (!) 125/94 (!) 145/75  Pulse: (!) 52 (!) 49  Resp: 19 14  Temp:    SpO2: 100% 96%    Last Pain:  Vitals:   12/26/17 0835  PainSc: 0-No pain                 Precious Haws Piscitello

## 2017-12-26 NOTE — Anesthesia Post-op Follow-up Note (Signed)
Anesthesia QCDR form completed.        

## 2017-12-26 NOTE — Op Note (Signed)
[facility] Gastroenterology Patient Name: Melvin Brewer Procedure Date: 12/26/2017 7:07 AM MRN: [id number] Account #: [id number] Date of Birth: 1950/12/01 Admit Type: Outpatient Age: 67 Room: [facility] ENDO ROOM 2 Gender: Male Note Status: Finalized Procedure:            Colonoscopy Indications:          Screening for colorectal malignant neoplasm Providers:            Lollie Sails, MD Referring MD:         Kerin Perna MD, MD (Referring MD) Medicines:            Monitored Anesthesia Care Complications:        No immediate complications. Procedure:            Pre-Anesthesia Assessment:                       - ASA Grade Assessment: III - A patient with severe                        systemic disease.                       After obtaining informed consent, the colonoscope was                        passed under direct vision. Throughout the procedure,                        the patient's blood pressure, pulse, and oxygen                        saturations were monitored continuously. The                        Colonoscope was introduced through the anus and                        advanced to the the cecum, identified by appendiceal                        orifice and ileocecal valve. The colonoscopy was                        performed without difficulty. The patient tolerated the                        procedure well. The quality of the bowel preparation                        was good. Findings:      A 5 mm polyp was found in the descending colon. The polyp was sessile.       The polyp was removed with a cold snare. Resection and retrieval were       complete.      A 1 mm polyp was found in the rectum. The polyp was sessile. The polyp       was removed with a cold biopsy forceps. Resection and retrieval were       complete.      A 2 mm polyp was found in the proximal transverse colon. The polyp was  sessile. The polyp was removed with a cold biopsy  forceps. Resection and       retrieval were complete.      A few small-mouthed diverticula were found in the sigmoid colon and       descending colon.      The retroflexed view of the distal rectum and anal verge was normal and       showed no anal or rectal abnormalities. Impression:           - One 5 mm polyp in the descending colon, removed with                        a cold snare. Resected and retrieved.                       - One 1 mm polyp in the rectum, removed with a cold                        biopsy forceps. Resected and retrieved.                       - One 2 mm polyp in the proximal transverse colon,                        removed with a cold biopsy forceps. Resected and                        retrieved.                       - Diverticulosis in the sigmoid colon and in the                        descending colon.                       - The distal rectum and anal verge are normal on                        retroflexion view. Recommendation:       - Discharge patient to home.                       - Telephone GI clinic for pathology results in 1 week. Procedure Code(s):    --- Professional ---                       [phone], Colonoscopy, flexible; with removal of tumor(s),                        polyp(s), or other lesion(s) by snare technique                       45380, 56, Colonoscopy, flexible; with biopsy, single                        or multiple Diagnosis Code(s):    --- Professional ---                       Z12.11, Encounter for screening for malignant neoplasm  of colon                       D12.4, Benign neoplasm of descending colon                       K62.1, Rectal polyp                       D12.3, Benign neoplasm of transverse colon (hepatic                        flexure or splenic flexure)                       K57.30, Diverticulosis of large intestine without                        perforation or abscess without bleeding CPT copyright  2017 American Medical Association. All rights reserved. The codes documented in this report are preliminary and upon coder review may  be revised to meet current compliance requirements. Lollie Sails, MD 12/26/2017 8:04:10 AM This report has been signed electronically. Number of Addenda: 0 Note Initiated On: 12/26/2017 7:07 AM Scope Withdrawal Time: 0 hours 8 minutes 29 seconds  Total Procedure Duration: 0 hours 18 minutes 20 seconds       [facility]

## 2017-12-26 NOTE — H&P (Signed)
Outpatient short stay form Pre-procedure 12/26/2017 7:20 AM Melvin Sails MD  Primary Physician: Dr Hortencia Pilar  Reason for visit: Screening colonoscopy  History of present illness: Patient is a 67 year old male presenting today as above.  His last colonoscopy was about 10 years ago.  At that time he had some hyperplastic polyps.  Tolerated his prep well.  He takes a daily 81 mg aspirin.  Patient does have a history of a aortic aneurysm.  Is been stable per since.  Gust this also with vascular surgery and Ms. London had contacted his vascular surgeon in regards to this issue, Dr. Verner Mould, and this is fine to proceed.  He takes no other aspirin or blood thinning agent.    Current Facility-Administered Medications:  .  0.9 %  sodium chloride infusion, , Intravenous, Continuous, Melvin Sails, MD, Last Rate: 20 mL/hr at 12/26/17 0715, 1,000 mL at 12/26/17 0715  Medications Prior to Admission  Medication Sig Dispense Refill Last Dose  . rosuvastatin (CRESTOR) 40 MG tablet Take 40 mg by mouth daily.     Marland Kitchen albuterol (PROVENTIL HFA;VENTOLIN HFA) 108 (90 BASE) MCG/ACT inhaler Inhale 2 puffs into the lungs every 6 (six) hours as needed for wheezing or shortness of breath.   12/25/2017  . amLODipine (NORVASC) 2.5 MG tablet Take 1 tablet by mouth daily.   Taking  . aspirin EC 81 MG tablet Take 1 tablet by mouth daily.   12/24/2017  . atorvastatin (LIPITOR) 40 MG tablet Take 1 tablet by mouth daily.   Taking  . budesonide-formoterol (SYMBICORT) 160-4.5 MCG/ACT inhaler Place 2 sprays into the nose 2 (two) times daily.     . Cetirizine HCl (ZYRTEC ALLERGY) 10 MG CAPS Take 1 capsule by mouth daily.   Taking  . Cholecalciferol (VITAMIN D-1000 MAX ST) 1000 units tablet Take 1 tablet by mouth daily.   Taking  . DENTA 5000 PLUS 1.1 % CREA dental cream Take 1 application by mouth 3 (three) times daily. Brush teeth for at least 1 min 2-3 times a day  5 Taking  . docusate sodium 100 MG CAPS Take 100  mg by mouth 2 (two) times daily. (Patient not taking: Reported on 05/15/2017) 60 capsule 0 Not Taking  . fluticasone (FLONASE) 50 MCG/ACT nasal spray Place 1 spray into both nostrils daily.   Taking  . metFORMIN (GLUCOPHAGE) 500 MG tablet Take 1 tablet by mouth daily.   Taking  . metoprolol (LOPRESSOR) 50 MG tablet Take 50 mg by mouth 2 (two) times daily.   12/26/2017 at 0530  . theophylline (UNIPHYL) 400 MG 24 hr tablet Take 1 tablet by mouth daily.   12/26/2017 at 0530  . Tiotropium Bromide Monohydrate (SPIRIVA RESPIMAT) 2.5 MCG/ACT AERS Inhale 2 Inhalers into the lungs daily.   Taking     Allergies  Allergen Reactions  . Atorvastatin   . Sulfa Antibiotics Hives     Past Medical History:  Diagnosis Date  . AAA (abdominal aortic aneurysm) ([place])   . Abdominal aortic aneurysm (AAA) ([place])   . Allergic rhinitis   . Anginal pain ([place])   . Centrilobular emphysema ([place])   . Cervical spine degeneration   . COPD (chronic obstructive pulmonary disease) ([place])   . Coronary artery disease   . Diabetes mellitus without complication ([place])   . Diverticulitis   . DJD (degenerative joint disease)   . Eczema   . GERD (gastroesophageal reflux disease)   . H/O hiatal hernia    AGE 49 S  NO MEDS  . Hemorrhoids   . Herpes simplex infection   . Hypercholesterolemia   . Hyperglycemia   . Hyperlipidemia   . Hypertension   . Obesity   . Shortness of breath    W/ EXERTION     Review of systems:      Physical Exam    Heart and lungs: Rhythm without rub or gallop, lungs are bilaterally clear.    HEENT: Normocephalic atraumatic eyes are anicteric    Other:    Pertinant exam for procedure: Soft nontender nondistended bowel sounds positive normoactive    Planned proceedures: Colonoscopy and indicated procedures. I have discussed the risks benefits and complications of procedures to include not limited to bleeding, infection, perforation and the risk of sedation and the patient wishes to  proceed.    Melvin Sails, MD Gastroenterology 12/26/2017  7:20 AM

## 2017-12-27 LAB — SURGICAL PATHOLOGY

## 2018-01-22 ENCOUNTER — Encounter: Payer: Self-pay | Admitting: Dietician

## 2018-07-18 ENCOUNTER — Telehealth: Payer: Self-pay | Admitting: Dietician

## 2018-07-18 NOTE — Telephone Encounter (Signed)
Received phone call from patient with concern regarding recent blood sugar readings. He checks fasting BGs daily and reports recent BGs of (beginning 07/13/18): 155, 120, 113, 143, 104, 176 -- today. He stopped eating snacks a few days ago in effort to reduce BGs. Reports recent bout of pneumonia and has COPD. He started on a round of Prednisone 2 days ago. He is taking MetFORMIN 500mg  two times daily for diabetes.   Discussed effects of Prednisone on BGs as well as illness, stress, fatigue. Advised patient to check BGs more often during the day and to contact MD if BGs continue to increase into 200s. Also advised him to drink plenty of sugar free fluids.

## 2018-08-23 ENCOUNTER — Other Ambulatory Visit
Admission: RE | Admit: 2018-08-23 | Discharge: 2018-08-23 | Disposition: A | Discharge status: Home / Self Care | Payer: Medicare Other | Source: Ambulatory Visit | Attending: Internal Medicine | Admitting: Internal Medicine

## 2018-08-23 DIAGNOSIS — I2510 Atherosclerotic heart disease of native coronary artery without angina pectoris: Secondary | ICD-10-CM | POA: Insufficient documentation

## 2018-08-23 LAB — BRAIN NATRIURETIC PEPTIDE: B Natriuretic Peptide: 196 pg/mL — ABNORMAL HIGH (ref 0.0–100.0)

## 2018-08-31 ENCOUNTER — Other Ambulatory Visit: Payer: Self-pay

## 2018-08-31 ENCOUNTER — Other Ambulatory Visit
Admission: RE | Admit: 2018-08-31 | Discharge: 2018-08-31 | Disposition: A | Discharge status: Home / Self Care | Payer: Medicare Other | Source: Ambulatory Visit | Attending: Internal Medicine | Admitting: Internal Medicine

## 2018-08-31 DIAGNOSIS — Z01812 Encounter for preprocedural laboratory examination: Secondary | ICD-10-CM | POA: Insufficient documentation

## 2018-08-31 DIAGNOSIS — Z1159 Encounter for screening for other viral diseases: Secondary | ICD-10-CM | POA: Insufficient documentation

## 2018-09-01 LAB — NOVEL CORONAVIRUS, NAA (HOSP ORDER, SEND-OUT TO REF LAB; TAT 18-24 HRS): SARS-CoV-2, NAA: NOT DETECTED

## 2018-09-05 ENCOUNTER — Ambulatory Visit
Admission: RE | Admit: 2018-09-05 | Discharge: 2018-09-05 | Disposition: A | Discharge status: Home / Self Care | Payer: Medicare Other | Attending: Internal Medicine | Admitting: Internal Medicine

## 2018-09-05 ENCOUNTER — Encounter
Admission: RE | Disposition: A | Discharge status: Home / Self Care | Payer: Self-pay | Source: Home / Self Care | Attending: Internal Medicine

## 2018-09-05 ENCOUNTER — Other Ambulatory Visit: Payer: Self-pay

## 2018-09-05 DIAGNOSIS — Z79899 Other long term (current) drug therapy: Secondary | ICD-10-CM | POA: Insufficient documentation

## 2018-09-05 DIAGNOSIS — E119 Type 2 diabetes mellitus without complications: Secondary | ICD-10-CM | POA: Diagnosis not present

## 2018-09-05 DIAGNOSIS — E785 Hyperlipidemia, unspecified: Secondary | ICD-10-CM | POA: Insufficient documentation

## 2018-09-05 DIAGNOSIS — Z87891 Personal history of nicotine dependence: Secondary | ICD-10-CM | POA: Insufficient documentation

## 2018-09-05 DIAGNOSIS — I714 Abdominal aortic aneurysm, without rupture: Secondary | ICD-10-CM | POA: Diagnosis not present

## 2018-09-05 DIAGNOSIS — Z6832 Body mass index (BMI) 32.0-32.9, adult: Secondary | ICD-10-CM | POA: Insufficient documentation

## 2018-09-05 DIAGNOSIS — E669 Obesity, unspecified: Secondary | ICD-10-CM | POA: Diagnosis not present

## 2018-09-05 DIAGNOSIS — Z7982 Long term (current) use of aspirin: Secondary | ICD-10-CM | POA: Insufficient documentation

## 2018-09-05 DIAGNOSIS — Z7984 Long term (current) use of oral hypoglycemic drugs: Secondary | ICD-10-CM | POA: Insufficient documentation

## 2018-09-05 DIAGNOSIS — R0602 Shortness of breath: Secondary | ICD-10-CM | POA: Insufficient documentation

## 2018-09-05 DIAGNOSIS — I10 Essential (primary) hypertension: Secondary | ICD-10-CM | POA: Diagnosis not present

## 2018-09-05 DIAGNOSIS — I2582 Chronic total occlusion of coronary artery: Secondary | ICD-10-CM | POA: Insufficient documentation

## 2018-09-05 DIAGNOSIS — I2510 Atherosclerotic heart disease of native coronary artery without angina pectoris: Secondary | ICD-10-CM | POA: Insufficient documentation

## 2018-09-05 HISTORY — PX: RIGHT/LEFT HEART CATH AND CORONARY/GRAFT ANGIOGRAPHY: CATH118267

## 2018-09-05 LAB — GLUCOSE, CAPILLARY
Glucose-Capillary: 97 mg/dL (ref 70–99)
Glucose-Capillary: 87 mg/dL (ref 70–99)

## 2018-09-05 SURGERY — RIGHT/LEFT HEART CATH AND CORONARY/GRAFT ANGIOGRAPHY
Anesthesia: Moderate Sedation | Laterality: Left

## 2018-09-05 MED ORDER — ASPIRIN 81 MG PO CHEW
CHEWABLE_TABLET | ORAL | Status: AC
  Filled 2018-09-05: qty 1

## 2018-09-05 MED ORDER — IOHEXOL 300 MG/ML  SOLN
INTRAMUSCULAR | Status: DC | PRN
  Administered 2018-09-05: 225 mL via INTRA_ARTERIAL

## 2018-09-05 MED ORDER — ACETAMINOPHEN 325 MG PO TABS: 650.0000 mg | ORAL_TABLET | ORAL | Status: DC | PRN

## 2018-09-05 MED ORDER — SODIUM CHLORIDE 0.9 % WEIGHT BASED INFUSION
288.6000 mL/h | INTRAVENOUS | Status: AC
  Administered 2018-09-05: 12:00:00 3 mL/kg/h via INTRAVENOUS

## 2018-09-05 MED ORDER — HYDRALAZINE HCL 20 MG/ML IJ SOLN
INTRAMUSCULAR | Status: DC | PRN
  Administered 2018-09-05 (×2): 10 mg via INTRAVENOUS

## 2018-09-05 MED ORDER — FENTANYL CITRATE (PF) 100 MCG/2ML IJ SOLN
INTRAMUSCULAR | Status: DC | PRN
  Administered 2018-09-05: 50 ug via INTRAVENOUS

## 2018-09-05 MED ORDER — MIDAZOLAM HCL 2 MG/2ML IJ SOLN
INTRAMUSCULAR | Status: AC
  Filled 2018-09-05: qty 2

## 2018-09-05 MED ORDER — HEPARIN (PORCINE) IN NACL 1000-0.9 UT/500ML-% IV SOLN
INTRAVENOUS | Status: AC
  Filled 2018-09-05: qty 1000

## 2018-09-05 MED ORDER — ASPIRIN 81 MG PO CHEW
81.0000 mg | CHEWABLE_TABLET | ORAL | Status: AC
  Administered 2018-09-05: 81 mg via ORAL

## 2018-09-05 MED ORDER — MIDAZOLAM HCL 2 MG/2ML IJ SOLN
INTRAMUSCULAR | Status: DC | PRN
  Administered 2018-09-05: 2 mg via INTRAVENOUS

## 2018-09-05 MED ORDER — FENTANYL CITRATE (PF) 100 MCG/2ML IJ SOLN
INTRAMUSCULAR | Status: AC
  Filled 2018-09-05: qty 2

## 2018-09-05 MED ORDER — SODIUM CHLORIDE 0.9 % IV SOLN: 250.0000 mL | INTRAVENOUS | Status: DC | PRN

## 2018-09-05 MED ORDER — SODIUM CHLORIDE 0.9% FLUSH: 3.0000 mL | Freq: Two times a day (BID) | INTRAVENOUS | Status: DC

## 2018-09-05 MED ORDER — HEPARIN SODIUM (PORCINE) 1000 UNIT/ML IJ SOLN
INTRAMUSCULAR | Status: AC
  Filled 2018-09-05: qty 1

## 2018-09-05 MED ORDER — HYDRALAZINE HCL 20 MG/ML IJ SOLN: 10.0000 mg | INTRAMUSCULAR | Status: DC | PRN

## 2018-09-05 MED ORDER — HYDRALAZINE HCL 20 MG/ML IJ SOLN
INTRAMUSCULAR | Status: AC
  Filled 2018-09-05: qty 1

## 2018-09-05 MED ORDER — SODIUM CHLORIDE 0.9 % WEIGHT BASED INFUSION: 1.0000 mL/kg/h | INTRAVENOUS | Status: DC

## 2018-09-05 MED ORDER — SODIUM CHLORIDE 0.9% FLUSH: 3.0000 mL | INTRAVENOUS | Status: DC | PRN

## 2018-09-05 MED ORDER — LABETALOL HCL 5 MG/ML IV SOLN: 10.0000 mg | INTRAVENOUS | Status: DC | PRN

## 2018-09-05 MED ORDER — ONDANSETRON HCL 4 MG/2ML IJ SOLN: 4.0000 mg | Freq: Four times a day (QID) | INTRAMUSCULAR | Status: DC | PRN

## 2018-09-05 SURGICAL SUPPLY — 16 items
CATH INFINITI 5 FR IM (CATHETERS) ×1 IMPLANT
CATH INFINITI 5FR ANG PIGTAIL (CATHETERS) ×1 IMPLANT
CATH INFINITI 5FR JL4 (CATHETERS) ×1 IMPLANT
CATH INFINITI JR4 5F (CATHETERS) ×1 IMPLANT
CATH SWANZ 7F THERMO (CATHETERS) ×1 IMPLANT
DEVICE CLOSURE MYNXGRIP 5F (Vascular Products) ×1 IMPLANT
GUIDEWIRE EMER 3M J .025X150CM (WIRE) ×1 IMPLANT
KIT MANI 3VAL PERCEP (MISCELLANEOUS) ×2 IMPLANT
KIT RIGHT HEART (MISCELLANEOUS) ×1 IMPLANT
NDL PERC 18GX7CM (NEEDLE) IMPLANT
NEEDLE PERC 18GX7CM (NEEDLE) ×2 IMPLANT
PACK CARDIAC CATH (CUSTOM PROCEDURE TRAY) ×2 IMPLANT
SHEATH AVANTI 5FR X 11CM (SHEATH) ×1 IMPLANT
SHEATH AVANTI 7FRX11 (SHEATH) ×1 IMPLANT
WIRE EMERALD 3MM-J .035X260CM (WIRE) ×1 IMPLANT
WIRE GUIDERIGHT .035X150 (WIRE) ×1 IMPLANT

## 2018-09-05 NOTE — Discharge Instructions (Signed)

## 2018-09-07 ENCOUNTER — Encounter: Payer: Self-pay | Admitting: Internal Medicine

## 2018-09-07 ENCOUNTER — Other Ambulatory Visit: Payer: Self-pay | Admitting: *Deleted

## 2018-09-07 DIAGNOSIS — R0602 Shortness of breath: Secondary | ICD-10-CM

## 2018-09-12 ENCOUNTER — Ambulatory Visit
Admission: RE | Admit: 2018-09-12 | Discharge: 2018-09-12 | Disposition: A | Discharge status: Home / Self Care | Payer: Medicare Other | Source: Ambulatory Visit | Attending: Cardiothoracic Surgery | Admitting: Cardiothoracic Surgery

## 2018-09-12 ENCOUNTER — Other Ambulatory Visit: Payer: Self-pay

## 2018-09-12 DIAGNOSIS — R0602 Shortness of breath: Secondary | ICD-10-CM | POA: Insufficient documentation

## 2018-09-12 MED ORDER — IOHEXOL 300 MG/ML  SOLN
75.0000 mL | Freq: Once | INTRAMUSCULAR | Status: AC | PRN
  Administered 2018-09-12: 75 mL via INTRAVENOUS

## 2018-09-13 ENCOUNTER — Encounter: Payer: Self-pay | Admitting: Cardiothoracic Surgery

## 2018-09-13 ENCOUNTER — Other Ambulatory Visit: Payer: Self-pay | Admitting: *Deleted

## 2018-09-13 ENCOUNTER — Institutional Professional Consult (permissible substitution) (INDEPENDENT_AMBULATORY_CARE_PROVIDER_SITE_OTHER): Payer: Medicare Other | Admitting: Cardiothoracic Surgery

## 2018-09-13 VITALS — BP 170/94 | HR 58 | Temp 98.1°F | Resp 20 | Ht 68.0 in | Wt 212.0 lb

## 2018-09-13 DIAGNOSIS — I25118 Atherosclerotic heart disease of native coronary artery with other forms of angina pectoris: Secondary | ICD-10-CM | POA: Diagnosis not present

## 2018-09-13 DIAGNOSIS — I2510 Atherosclerotic heart disease of native coronary artery without angina pectoris: Secondary | ICD-10-CM

## 2018-09-13 DIAGNOSIS — Z951 Presence of aortocoronary bypass graft: Secondary | ICD-10-CM

## 2018-09-13 NOTE — Progress Notes (Signed)
PCP is Hortencia Pilar, MD Referring Provider is Yolonda Kida, MD  Chief Complaint  Patient presents with  . Coronary Artery Disease    Surgical eval, Cardiac Cath 09/05/18, ECHO 09/11/18, Chest CT 09/12/18, HX of CABG 2007    HPI:see consult dictated when Epic was disabled   Past Medical History:  Diagnosis Date  . AAA (abdominal aortic aneurysm) ([place])   . Abdominal aortic aneurysm (AAA) ([place])   . Allergic rhinitis   . Anginal pain ([place])   . Centrilobular emphysema ([place])   . Cervical spine degeneration   . COPD (chronic obstructive pulmonary disease) ([place])   . Coronary artery disease   . Diabetes mellitus without complication ([place])   . Diverticulitis   . DJD (degenerative joint disease)   . Eczema   . GERD (gastroesophageal reflux disease)   . H/O hiatal hernia    AGE 15 S  NO MEDS  . Hemorrhoids   . Herpes simplex infection   . Hypercholesterolemia   . Hyperglycemia   . Hyperlipidemia   . Hypertension   . Obesity   . Shortness of breath    W/ EXERTION     Past Surgical History:  Procedure Laterality Date  . ANTERIOR CERVICAL DECOMP/DISCECTOMY FUSION N/A 06/20/2013   Procedure: CERVCIAL FIVE-SIX,CERVICAL SIX-SEVEN ANTERIOR CERVICAL DECOMPRESSION WITH FUSION INTERBODY PROSTHESIS PLATING AND PEEK CAGE;  Surgeon: Ophelia Charter, MD;  Location: [place] NEURO ORS;  Service: Neurosurgery;  Laterality: N/A;  anterior  . CARDIAC CATHETERIZATION     [place]  2007   . COLONOSCOPY WITH PROPOFOL N/A 12/26/2017   Procedure: COLONOSCOPY WITH PROPOFOL;  Surgeon: Lollie Sails, MD;  Location: [facility] ENDOSCOPY;  Service: Endoscopy;  Laterality: N/A;  . CORONARY ARTERY BYPASS GRAFT     2007  _0   . EYE SURGERY     RT EYE LASER (RET DETACHMENT)  . RIGHT/LEFT HEART CATH AND CORONARY/GRAFT ANGIOGRAPHY Left 09/05/2018   Procedure: RIGHT/LEFT HEART CATH AND CORONARY/GRAFT ANGIOGRAPHY;  Surgeon: Yolonda Kida, MD;  Location: [place] CV LAB;  Service: Cardiovascular;   Laterality: Left;  . TONSILLECTOMY      History reviewed. No pertinent family history.  Social History Social History   Tobacco Use  . Smoking status: Former Smoker    Packs/day: 2.00    Years: 43.00    Pack years: 86.00    Types: Cigarettes    Last attempt to quit: 04/11/2004    Years since quitting: 14.4  . Smokeless tobacco: Never Used  Substance Use Topics  . Alcohol use: Yes    Alcohol/week: 3.0 - 4.0 standard drinks    Types: 3 - 4 Standard drinks or equivalent per week  . Drug use: No    Current Outpatient Medications  Medication Sig Dispense Refill  . albuterol (PROVENTIL HFA;VENTOLIN HFA) 108 (90 BASE) MCG/ACT inhaler Inhale 2 puffs into the lungs every 6 (six) hours as needed for wheezing or shortness of breath.    Marland Kitchen albuterol (PROVENTIL) (2.5 MG/3ML) 0.083% nebulizer solution Inhale 2.5 mg into the lungs every 6 (six) hours as needed for wheezing or shortness of breath.    Marland Kitchen aspirin EC 81 MG tablet Take 81 mg by mouth every evening.     Marland Kitchen azelastine (ASTELIN) 0.1 % nasal spray Place 1 spray into both nostrils 2 (two) times a day. Use in each nostril as directed    . azithromycin (ZITHROMAX) 250 MG tablet Take 250 mg by mouth every Monday, Wednesday, and Friday.    Marland Kitchen  Cholecalciferol (VITAMIN D3) 50 MCG (2000 UT) TABS Take 2,000 Units by mouth every evening.    . DENTA 5000 PLUS 1.1 % CREA dental cream Take 1 application by mouth at bedtime. Brush teeth for at least 1 min  5  . fexofenadine (ALLEGRA) 180 MG tablet Take 180 mg by mouth daily.    . fluticasone (FLONASE) 50 MCG/ACT nasal spray Place 1 spray into both nostrils daily.    Marland Kitchen losartan (COZAAR) 25 MG tablet Take 25 mg by mouth daily.    . metFORMIN (GLUCOPHAGE) 500 MG tablet Take 500 mg by mouth 2 (two) times a day.     . metoprolol (LOPRESSOR) 50 MG tablet Take 50 mg by mouth 2 (two) times daily.    . montelukast (SINGULAIR) 10 MG tablet Take 10 mg by mouth every evening.    . predniSONE (DELTASONE) 5 MG tablet  Take 5 mg by mouth daily with breakfast.    . rosuvastatin (CRESTOR) 40 MG tablet Take 40 mg by mouth daily.    . sildenafil (REVATIO) 20 MG tablet Take 20-100 mg by mouth daily as needed for erectile dysfunction.    . SYMBICORT 160-4.5 MCG/ACT inhaler Inhale 2 puffs into the lungs 2 (two) times a day.    . Tiotropium Bromide Monohydrate (SPIRIVA RESPIMAT) 2.5 MCG/ACT AERS Inhale 2 puffs into the lungs daily.     . valACYclovir (VALTREX) 500 MG tablet Take 500 mg by mouth every evening.     No current facility-administered medications for this visit.     Allergies  Allergen Reactions  . Atorvastatin Other (See Comments)    Leg cramps  . Sulfa Antibiotics Rash    Review of Systems  BP (!) 170/94   Pulse (!) 58   Temp 98.1 F (36.7 C) (Skin)   Resp 20   Ht _0  (1.727 m)   Wt 212 lb (96.2 kg)   SpO2 91% Comment: RA  BMI 32.23 kg/m  Physical Exam   Diagnostic Tests:   Impression:   Plan: See dictated consult when Epic was disabled   Len Childs, MD [facility] [phone]

## 2018-09-18 ENCOUNTER — Ambulatory Visit (HOSPITAL_COMMUNITY)
Admission: RE | Admit: 2018-09-18 | Discharge: 2018-09-18 | Disposition: A | Discharge status: Home / Self Care | Payer: Medicare Other | Source: Ambulatory Visit | Attending: Cardiothoracic Surgery | Admitting: Cardiothoracic Surgery

## 2018-09-18 ENCOUNTER — Other Ambulatory Visit: Payer: Self-pay

## 2018-09-18 ENCOUNTER — Ambulatory Visit (HOSPITAL_BASED_OUTPATIENT_CLINIC_OR_DEPARTMENT_OTHER)
Admission: RE | Admit: 2018-09-18 | Discharge: 2018-09-18 | Disposition: A | Discharge status: Home / Self Care | Payer: Medicare Other | Source: Ambulatory Visit | Attending: Cardiothoracic Surgery | Admitting: Cardiothoracic Surgery

## 2018-09-18 DIAGNOSIS — I2510 Atherosclerotic heart disease of native coronary artery without angina pectoris: Secondary | ICD-10-CM | POA: Insufficient documentation

## 2018-09-18 DIAGNOSIS — J988 Other specified respiratory disorders: Secondary | ICD-10-CM | POA: Diagnosis not present

## 2018-09-18 DIAGNOSIS — I6523 Occlusion and stenosis of bilateral carotid arteries: Secondary | ICD-10-CM | POA: Insufficient documentation

## 2018-09-18 LAB — PULMONARY FUNCTION TEST
DL/VA % pred: 95 %
DL/VA: 3.9 ml/min/mmHg/L
DLCO unc % pred: 56 %
DLCO unc: 14.34 ml/min/mmHg
FEF 25-75 Post: 0.33 L/sec
FEF 25-75 Pre: 0.27 L/sec
FEF2575-%Change-Post: 22 %
FEF2575-%Pred-Post: 13 %
FEF2575-%Pred-Pre: 11 %
FEV1-%Change-Post: 14 %
FEV1-%Pred-Post: 33 %
FEV1-%Pred-Pre: 29 %
FEV1-Post: 1.07 L
FEV1-Pre: 0.93 L
FEV1FVC-%Change-Post: 8 %
FEV1FVC-%Pred-Pre: 60 %
FEV6-%Change-Post: 6 %
FEV6-%Pred-Post: 47 %
FEV6-%Pred-Pre: 44 %
FEV6-Post: 1.91 L
FEV6-Pre: 1.79 L
FEV6FVC-%Change-Post: 0 %
FEV6FVC-%Pred-Post: 91 %
FEV6FVC-%Pred-Pre: 90 %
FVC-%Change-Post: 5 %
FVC-%Pred-Post: 51 %
FVC-%Pred-Pre: 48 %
FVC-Post: 2.22 L
FVC-Pre: 2.09 L
Post FEV1/FVC ratio: 48 %
Post FEV6/FVC ratio: 86 %
Pre FEV1/FVC ratio: 45 %
Pre FEV6/FVC Ratio: 86 %
RV % pred: 135 %
RV: 3.21 L
TLC % pred: 79 %
TLC: 5.43 L

## 2018-09-18 MED ORDER — ALBUTEROL SULFATE (2.5 MG/3ML) 0.083% IN NEBU
2.5000 mg | INHALATION_SOLUTION | Freq: Once | RESPIRATORY_TRACT | Status: AC
  Administered 2018-09-18: 2.5 mg via RESPIRATORY_TRACT

## 2018-09-18 NOTE — Progress Notes (Signed)
Pre-CABG testing and left saphenous vein mapping has been completed. Preliminary results can be found in CV Proc through chart review.   09/18/18 2:58 PM Melvin Brewer RVT

## 2018-09-20 ENCOUNTER — Other Ambulatory Visit: Payer: Self-pay

## 2018-09-21 ENCOUNTER — Ambulatory Visit (INDEPENDENT_AMBULATORY_CARE_PROVIDER_SITE_OTHER): Payer: Medicare Other | Admitting: Cardiothoracic Surgery

## 2018-09-21 ENCOUNTER — Encounter: Payer: Self-pay | Admitting: *Deleted

## 2018-09-21 ENCOUNTER — Encounter: Payer: Self-pay | Admitting: Cardiothoracic Surgery

## 2018-09-21 ENCOUNTER — Other Ambulatory Visit: Payer: Self-pay | Admitting: *Deleted

## 2018-09-21 VITALS — BP 160/80 | HR 58 | Temp 97.9°F | Resp 20 | Ht 68.0 in | Wt 219.0 lb

## 2018-09-21 DIAGNOSIS — Z951 Presence of aortocoronary bypass graft: Secondary | ICD-10-CM | POA: Diagnosis not present

## 2018-09-21 DIAGNOSIS — I2510 Atherosclerotic heart disease of native coronary artery without angina pectoris: Secondary | ICD-10-CM

## 2018-09-21 NOTE — Progress Notes (Signed)
PCP is Hortencia Pilar, MD Referring Provider is Yolonda Kida, MD  Chief Complaint  Patient presents with  . Coronary Artery Disease    f/u after PFT's and Dopplers 09/18/18, Cardiac Cath 09/19/18    HPI: Patient presents for further follow-up of his increasing dyspnea on exertion.  This is been going on for several weeks and was felt to be out of proportion to his degree of COPD.  He underwent CABG x3 in 2007 at [facility] for unstable angina.  He was evaluated by Dr. Clayborn Bigness who felt that cardiac catheterization was indicated to assess recurrent coronary disease.  Results of catheterization demonstrated patent sequential vein graft to the OM1 and OM 2.  The RCA was nondominant and small.  The left IMA graft to the LAD was atretic because of a high-grade proximal left subclavian stenosis.  The distal LAD appeared to be a graftable vessel.  For that reason the patient was referred for evaluation of redo CABG.  LVEF by ventriculogram and echocardiogram is normal.  There is no associated valvular disease noted on the studies.  The patient does have significant COPD and sees a pulmonologist in [place].  He recently completed a course of oral prednisone.  He is on long-term Z-Pak Monday Wednesday Friday.  He is not on home oxygen.  PFTs indicate FEV1 of 960 cc, which improves with an bronchodilator, diffusion capacity 56% predicted.  CT scan of the chest images show moderate COPD, no at risk lesions or large blebs.  Since initial visit the patient has undergone additional studies including pre-CABG Dopplers.  He has no significant carotid disease.  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  Vein mapping shows the left leg saphenous vein should be an adequate conduit.  Patient also has a documented patent right IMA demonstrated a catheterization however the right IMA would probably not be long enough to graft  his distal LAD beyond the previous left IMA graft.  The patient's pulmonary status currently is fairly stable.  He was able to walk up and down the hallway 100 feet with oxygen saturation at rest 96% increasing to 98% post ambulation.  The patient will be scheduled for redo sternotomy and CABG x1 on June 25 at [facility].  I discussed the procedure in detail in the presence of both the patient and his wife and we reviewed the benefits and risks.  Patient will stop his metformin for his diabetes 48 hours prior to surgery. Past Medical History:  Diagnosis Date  . AAA (abdominal aortic aneurysm) ([place])   . Abdominal aortic aneurysm (AAA) ([place])   . Allergic rhinitis   . Anginal pain ([place])   . Centrilobular emphysema ([place])   . Cervical spine degeneration   . COPD (chronic obstructive pulmonary disease) ([place])   . Coronary artery disease   . Diabetes mellitus without complication ([place])   . Diverticulitis   . DJD (degenerative joint disease)   . Eczema   . GERD (gastroesophageal reflux disease)   . H/O hiatal hernia    AGE 45 S  NO MEDS  . Hemorrhoids   . Herpes simplex infection   . Hypercholesterolemia   . Hyperglycemia   . Hyperlipidemia   . Hypertension   . Obesity   . Shortness of breath    W/ EXERTION     Past Surgical History:  Procedure Laterality Date  . ANTERIOR CERVICAL DECOMP/DISCECTOMY FUSION N/A 06/20/2013  Procedure: CERVCIAL FIVE-SIX,CERVICAL SIX-SEVEN ANTERIOR CERVICAL DECOMPRESSION WITH FUSION INTERBODY PROSTHESIS PLATING AND PEEK CAGE;  Surgeon: Ophelia Charter, MD;  Location: [place] NEURO ORS;  Service: Neurosurgery;  Laterality: N/A;  anterior  . CARDIAC CATHETERIZATION       2007   . COLONOSCOPY WITH PROPOFOL N/A 12/26/2017   Procedure: COLONOSCOPY WITH PROPOFOL;  Surgeon: Lollie Sails, MD;  Location: [facility] ENDOSCOPY;  Service: Endoscopy;  Laterality: N/A;  . CORONARY ARTERY BYPASS GRAFT     2007  @[facility]   . EYE SURGERY     RT EYE LASER (RET  DETACHMENT)  . RIGHT/LEFT HEART CATH AND CORONARY/GRAFT ANGIOGRAPHY Left 09/05/2018   Procedure: RIGHT/LEFT HEART CATH AND CORONARY/GRAFT ANGIOGRAPHY;  Surgeon: Yolonda Kida, MD;  Location: [place] CV LAB;  Service: Cardiovascular;  Laterality: Left;  . TONSILLECTOMY      No family history on file.  Social History Social History   Tobacco Use  . Smoking status: Former Smoker    Packs/day: 2.00    Years: 43.00    Pack years: 86.00    Types: Cigarettes    Quit date: 04/11/2004    Years since quitting: 14.4  . Smokeless tobacco: Never Used  Substance Use Topics  . Alcohol use: Yes    Alcohol/week: 3.0 - 4.0 standard drinks    Types: 3 - 4 Standard drinks or equivalent per week  . Drug use: No    Current Outpatient Medications  Medication Sig Dispense Refill  . albuterol (PROVENTIL HFA;VENTOLIN HFA) 108 (90 BASE) MCG/ACT inhaler Inhale 2 puffs into the lungs every 6 (six) hours as needed for wheezing or shortness of breath.    Marland Kitchen albuterol (PROVENTIL) (2.5 MG/3ML) 0.083% nebulizer solution Inhale 2.5 mg into the lungs every 6 (six) hours as needed for wheezing or shortness of breath.    Marland Kitchen aspirin EC 81 MG tablet Take 81 mg by mouth every evening.     Marland Kitchen azithromycin (ZITHROMAX) 250 MG tablet Take 250 mg by mouth every Monday, Wednesday, and Friday.    . Cholecalciferol (VITAMIN D3) 50 MCG (2000 UT) TABS Take 2,000 Units by mouth every evening.    . DENTA 5000 PLUS 1.1 % CREA dental cream Take 1 application by mouth at bedtime. Brush teeth for at least 1 min  5  . fexofenadine (ALLEGRA) 180 MG tablet Take 180 mg by mouth daily.    . fluticasone (FLONASE) 50 MCG/ACT nasal spray Place 1 spray into both nostrils daily.    Marland Kitchen losartan (COZAAR) 25 MG tablet Take 25 mg by mouth daily.    . metFORMIN (GLUCOPHAGE) 500 MG tablet Take 500 mg by mouth 2 (two) times a day.     . metoprolol (LOPRESSOR) 50 MG tablet Take 50 mg by mouth 2 (two) times daily.    . montelukast (SINGULAIR) 10  MG tablet Take 10 mg by mouth every evening.    . rosuvastatin (CRESTOR) 40 MG tablet Take 40 mg by mouth daily.    . sildenafil (REVATIO) 20 MG tablet Take 20-100 mg by mouth daily as needed for erectile dysfunction.    . SYMBICORT 160-4.5 MCG/ACT inhaler Inhale 2 puffs into the lungs 2 (two) times a day.    . Tiotropium Bromide Monohydrate (SPIRIVA RESPIMAT) 2.5 MCG/ACT AERS Inhale 2 puffs into the lungs daily.     . valACYclovir (VALTREX) 500 MG tablet Take 500 mg by mouth every evening.     No current facility-administered medications for this visit.  Allergies  Allergen Reactions  . Atorvastatin Other (See Comments)    Leg cramps  . Sulfa Antibiotics Rash    Review of Systems                    Review of Systems :  [ y ] = yes, [  ] = no        General :  Weight gain [  y ]    Weight loss  [   ]  Fatigue [  ]  Fever [  ]  Chills  [  ]                                          HEENT    Headache [  ]  Dizziness [  ]  Blurred vision [  ] Glaucoma  [  ]                          Nosebleeds [  ] Painful or loose teeth [  ]        Cardiac :  Chest pain/ pressure [  ]  Resting SOB [  ] exertional SOB [ y ]                        Orthopnea [  ]  Pedal edema  [ y ]  Palpitations [  ] Syncope/presyncope [ ]                         Paroxysmal nocturnal dyspnea [  ]         Pulmonary : cough Blue.Reese  ]  wheezing [ y ]  Hemoptysis [  ] Sputum [  ] Snoring [  ]                              Pneumothorax [  ]  Sleep apnea [  ]        GI : Vomiting [  ]  Dysphagia [  ]  Melena  [  ]  Abdominal pain [  ] BRBPR [  ]              Heart burn [  ]  Constipation [  ] Diarrhea  [  ] Colonoscopy [   ]        GU : Hematuria [  ]  Dysuria [  ]  Nocturia [  ] UTI's [  ]        Vascular : Claudication [  ]  Rest pain [  ]  DVT [  ] Vein stripping [  ] leg ulcers [  ]                          TIA [  ] Stroke [  ]  Varicose veins [  ]        NEURO :  Headaches  [  ] Seizures [  ] Vision changes [  ]  Paresthesias [  ]  Musculoskeletal :  Arthritis Blue.Reese  ] Gout  [  ]  Back pain [ y ]  Joint pain [  ]        Skin :  Rash [  ]  Melanoma [  ] Sores [  ]        Heme : Bleeding problems [  ]Clotting Disorders [  ] Anemia [  ]Blood Transfusion [ ]         Endocrine : Diabetes Blue.Reese  ] Heat or Cold intolerance [  ] Polyuria [  ]excessive thirst [ ]         Psych : Depression [  ]  Anxiety [  ]  Psych hospitalizations [  ] Memory change [  ]                                                                            BP (!) 160/80   Pulse (!) 58   Temp 97.9 F (36.6 C) (Skin)   Resp 20   Ht 5' 8"  (1.727 m)   Wt 219 lb (99.3 kg)   SpO2 94% Comment: RA  BMI 33.30 kg/m  Physical Exam      Exam    General- alert and comfortable    Neck- no JVD, no cervical adenopathy palpable, no carotid bruit   Lungs- clear without rales, scattered wheezes present    Cor- regular rate and rhythm, no murmur , gallop   Abdomen- soft, non-tender   Extremities - warm, non-tender, minimal edema   Neuro- oriented, appropriate, no focal weakness   Diagnostic Tests: CT scan images, coronary angiogram, echocardiogram all personally reviewed and discussed with patient  Impression: Exertional angina with recurrent CAD COPD moderate-severe Preserved LV systolic function Diabetes Hypertension  Plan: Patient will be scheduled for redo sternotomy and redo CABG x1 at [facility] in June 25.   Len Childs, MD [facility] [phone]

## 2018-10-01 ENCOUNTER — Encounter (HOSPITAL_COMMUNITY)
Admission: RE | Admit: 2018-10-01 | Discharge: 2018-10-01 | Disposition: A | Discharge status: Home / Self Care | Payer: Medicare Other | Source: Ambulatory Visit | Attending: Cardiothoracic Surgery | Admitting: Cardiothoracic Surgery

## 2018-10-01 ENCOUNTER — Other Ambulatory Visit (HOSPITAL_COMMUNITY)
Admission: RE | Admit: 2018-10-01 | Discharge: 2018-10-01 | Disposition: A | Discharge status: Home / Self Care | Payer: Medicare Other | Source: Ambulatory Visit | Attending: Cardiothoracic Surgery | Admitting: Cardiothoracic Surgery

## 2018-10-01 ENCOUNTER — Ambulatory Visit (HOSPITAL_COMMUNITY)
Admission: RE | Admit: 2018-10-01 | Discharge: 2018-10-01 | Disposition: A | Discharge status: Home / Self Care | Payer: Medicare Other | Source: Ambulatory Visit | Attending: Cardiothoracic Surgery | Admitting: Cardiothoracic Surgery

## 2018-10-01 ENCOUNTER — Other Ambulatory Visit: Payer: Self-pay

## 2018-10-01 ENCOUNTER — Encounter (HOSPITAL_COMMUNITY): Payer: Self-pay

## 2018-10-01 DIAGNOSIS — Z01818 Encounter for other preprocedural examination: Secondary | ICD-10-CM | POA: Insufficient documentation

## 2018-10-01 DIAGNOSIS — Z1159 Encounter for screening for other viral diseases: Secondary | ICD-10-CM | POA: Insufficient documentation

## 2018-10-01 DIAGNOSIS — I2510 Atherosclerotic heart disease of native coronary artery without angina pectoris: Secondary | ICD-10-CM

## 2018-10-01 LAB — BLOOD GAS, ARTERIAL
Acid-Base Excess: 1.2 mmol/L (ref 0.0–2.0)
Bicarbonate: 25.2 mmol/L (ref 20.0–28.0)
Drawn by: 470591
FIO2: 21
O2 Saturation: 99.2 %
Patient temperature: 98.6
pCO2 arterial: 39.4 mmHg (ref 32.0–48.0)
pH, Arterial: 7.421 (ref 7.350–7.450)
pO2, Arterial: 169 mmHg — ABNORMAL HIGH (ref 83.0–108.0)

## 2018-10-01 LAB — CBC
HCT: 46 % (ref 39.0–52.0)
Hemoglobin: 15 g/dL (ref 13.0–17.0)
MCH: 32.3 pg (ref 26.0–34.0)
MCHC: 32.6 g/dL (ref 30.0–36.0)
MCV: 98.9 fL (ref 80.0–100.0)
Platelets: 126 10*3/uL — ABNORMAL LOW (ref 150–400)
RBC: 4.65 MIL/uL (ref 4.22–5.81)
RDW: 13.2 % (ref 11.5–15.5)
WBC: 8.7 10*3/uL (ref 4.0–10.5)
nRBC: 0 % (ref 0.0–0.2)

## 2018-10-01 LAB — URINALYSIS, ROUTINE W REFLEX MICROSCOPIC
Bacteria, UA: NONE SEEN
Bilirubin Urine: NEGATIVE
Glucose, UA: NEGATIVE mg/dL
Hgb urine dipstick: NEGATIVE
Ketones, ur: NEGATIVE mg/dL
Leukocytes,Ua: NEGATIVE
Nitrite: NEGATIVE
Protein, ur: 30 mg/dL — AB
Specific Gravity, Urine: 1.013 (ref 1.005–1.030)
pH: 6 (ref 5.0–8.0)

## 2018-10-01 LAB — PROTIME-INR
INR: 1.1 (ref 0.8–1.2)
Prothrombin Time: 13.6 seconds (ref 11.4–15.2)

## 2018-10-01 LAB — COMPREHENSIVE METABOLIC PANEL
ALT: 50 U/L — ABNORMAL HIGH (ref 0–44)
AST: 24 U/L (ref 15–41)
Albumin: 3.7 g/dL (ref 3.5–5.0)
Alkaline Phosphatase: 43 U/L (ref 38–126)
Anion gap: 11 (ref 5–15)
BUN: 16 mg/dL (ref 8–23)
CO2: 20 mmol/L — ABNORMAL LOW (ref 22–32)
Calcium: 9.2 mg/dL (ref 8.9–10.3)
Chloride: 109 mmol/L (ref 98–111)
Creatinine, Ser: 1.03 mg/dL (ref 0.61–1.24)
GFR calc Af Amer: >60 mL/min (ref >60)
GFR calc non Af Amer: >60 mL/min (ref >60)
Glucose, Bld: 86 mg/dL (ref 70–99)
Potassium: 4.6 mmol/L (ref 3.5–5.1)
Sodium: 140 mmol/L (ref 135–145)
Total Bilirubin: 0.6 mg/dL (ref 0.3–1.2)
Total Protein: 5.9 g/dL — ABNORMAL LOW (ref 6.5–8.1)

## 2018-10-01 LAB — GLUCOSE, CAPILLARY: Glucose-Capillary: 73 mg/dL (ref 70–99)

## 2018-10-01 LAB — APTT: aPTT: 24 seconds (ref 24–36)

## 2018-10-01 LAB — SARS CORONAVIRUS 2 (TAT 6-24 HRS): SARS Coronavirus 2: NEGATIVE

## 2018-10-01 LAB — SURGICAL PCR SCREEN
MRSA, PCR: NEGATIVE
Staphylococcus aureus: NEGATIVE

## 2018-10-01 NOTE — Pre-Procedure Instructions (Signed)
Melvin Brewer  10/01/2018      [id number] [name], [place] [address] Phone: [phone] Fax: [phone]  [facility] [place] [place] [facility] [place] [address] Phone: [phone] Fax: [phone]    Your procedure is scheduled on October 04, 2018.  Report to [facility] Entrance "A" at 530 AM.  Call this number if you have problems the morning of surgery:  [phone]   Remember:  Do not eat or drink after midnight.   Take these medicines the morning of surgery with A SIP OF WATER  Metoprolol (lopressor) Albuterol inhaler/nebulizer -if needed, bring inhaler with you Symbicort inhaler Spiriva inhaler   Follow your surgeon's instructions on when to hold/resume aspirin.  If no instructions were given call the office to determine how they would like to you take aspirin   Beginning now, STOP taking any Aleve, Naproxen, Ibuprofen, Motrin, Advil, Goody's, BC's, all herbal medications, fish oil, and all vitamins  WHAT DO I DO ABOUT MY DIABETES MEDICATION?   Marland Kitchen Do not take oral diabetes medicines (pills) the morning of surgery-Metformin (glucophage).  . The day of surgery, do not take other diabetes injectables, including Byetta (exenatide), Bydureon (exenatide ER), Victoza (liraglutide), or Trulicity (dulaglutide).  Reviewed and Endorsed by [facility] Patient Education Committee, August 2015  How to Manage Your Diabetes Before and After Surgery  Why is it important to control my blood sugar before and after surgery? . Improving blood sugar levels before and after surgery helps healing and can limit problems. . A way of improving blood sugar control is eating a healthy diet by: o  Eating less sugar and carbohydrates o  Increasing activity/exercise o  Talking with your doctor about reaching your blood sugar goals . High blood  sugars (greater than 180 mg/dL) can raise your risk of infections and slow your recovery, so you will need to focus on controlling your diabetes during the weeks before surgery. . Make sure that the doctor who takes care of your diabetes knows about your planned surgery including the date and location.  How do I manage my blood sugar before surgery? . Check your blood sugar at least 4 times a day, starting 2 days before surgery, to make sure that the level is not too high or low. o Check your blood sugar the morning of your surgery when you wake up and every 2 hours until you get to the Short Stay unit. . If your blood sugar is less than 70 mg/dL, you will need to treat for low blood sugar: o Do not take insulin. o Treat a low blood sugar (less than 70 mg/dL) with  cup of clear juice (cranberry or apple), 4 glucose tablets, OR glucose gel. Recheck blood sugar in 15 minutes after treatment (to make sure it is greater than 70 mg/dL). If your blood sugar is not greater than 70 mg/dL on recheck, call [phone] o  for further instructions. . Report your blood sugar to the short stay nurse when you get to Short Stay.  . If you are admitted to the hospital after surgery: o Your blood sugar will be checked by the staff and you will probably be given insulin after surgery (instead of oral diabetes medicines) to make sure you have good blood sugar levels. o The goal for blood sugar control after surgery is  80-180 mg/dL.   [name] Preparing For Surgery  Before surgery, you can play an important role. Because skin is not sterile, your skin needs to be as free of germs as possible. You can reduce the number of germs on your skin by washing with CHG (chlorahexidine gluconate) Soap before surgery.  CHG is an antiseptic cleaner which kills germs and bonds with the skin to continue killing germs even after washing.    Oral Hygiene is also important to reduce your risk of infection.  Remember - BRUSH  YOUR TEETH THE MORNING OF SURGERY WITH YOUR REGULAR TOOTHPASTE  Please do not use if you have an allergy to CHG or antibacterial soaps. If your skin becomes reddened/irritated stop using the CHG.  Do not shave (including legs and underarms) for at least 48 hours prior to first CHG shower. It is OK to shave your face.  Please follow these instructions carefully.   1. Shower the NIGHT BEFORE SURGERY and the MORNING OF SURGERY with CHG.   2. If you chose to wash your hair, wash your hair first as usual with your normal shampoo.  3. After you shampoo, rinse your hair and body thoroughly to remove the shampoo.  4. Use CHG as you would any other liquid soap. You can apply CHG directly to the skin and wash gently with a scrungie or a clean washcloth.   5. Apply the CHG Soap to your body ONLY FROM THE NECK DOWN.  Do not use on open wounds or open sores. Avoid contact with your eyes, ears, mouth and genitals (private parts). Wash Face and genitals (private parts)  with your normal soap.  6. Wash thoroughly, paying special attention to the area where your surgery will be performed.  7. Thoroughly rinse your body with warm water from the neck down.  8. DO NOT shower/wash with your normal soap after using and rinsing off the CHG Soap.  9. Pat yourself dry with a CLEAN TOWEL.  10. Wear CLEAN PAJAMAS to bed the night before surgery, wear comfortable clothes the morning of surgery  11. Place CLEAN SHEETS on your bed the night of your first shower and DO NOT SLEEP WITH PETS.  Day of Surgery:  Do not apply any deodorants/lotions.  Please wear clean clothes to the hospital/surgery center.   Remember to brush your teeth WITH YOUR REGULAR TOOTHPASTE.   Do not wear jewelry  Do not wear lotions, powders, or colognes, or deodorant.  Men may shave face and neck.  Do not bring valuables to the hospital.  [facility] is not responsible for any belongings or valuables.  Contacts, dentures or bridgework  may not be worn into surgery.  Leave your suitcase in the car.  After surgery it may be brought to your room.  For patients admitted to the hospital, discharge time will be determined by your treatment team.  Patients discharged the day of surgery will not be allowed to drive home.    Please read over the following fact sheets that you were given.

## 2018-10-01 NOTE — Progress Notes (Addendum)
PCP - Hortencia Pilar, MD Cardiologist - Dr. Clayborn Bigness  Chest x-ray - 10/01/2018 at PAT EKG - 10/01/2018 at PAT  Stress Test - pt denies recently ECHO - 09/19/2018  Cardiac Cath - 09/19/2018  Sleep Study - NO CPAP - n/a  Fasting Blood Sugar - 100s Checks Blood Sugar 1 times a day  Blood Thinner Instructions: Aspirin Instructions: Aspirin 81 mg-Pt believes he continues it but he will call and double check today  Anesthesia review: Yes, heart hx  Patient denies shortness of breath, fever, cough and chest pain at PAT appointment  Patient verbalized understanding of instructions that were given to them at the PAT appointment. Patient was also instructed that they will need to review over the PAT instructions again at home before surgery.

## 2018-10-01 NOTE — Progress Notes (Signed)
Called and left a confidential voice message for Levonne Spiller, RN informing her of abnormal labs, specifically platlet count and advised to return my call with any questions.

## 2018-10-02 LAB — HEMOGLOBIN A1C
Hgb A1c MFr Bld: 6.2 % — ABNORMAL HIGH (ref 4.8–5.6)
Mean Plasma Glucose: 131 mg/dL

## 2018-10-02 NOTE — Consult Note (Signed)
NAME: Melvin Brewer, Melvin Brewer MEDICAL RECORD EX:[id number] ACCOUNT [id number] DATE OF BIRTH:1950/10/21 FACILITY: [facility] LOCATION:  PHYSICIAN:Ewald Beg VAN TRIGT III, MD  CONSULTATION  DATE OF ADMISSION:  09/13/2018  REASON FOR CONSULTATION:  Recurrent symptomatic single-vessel coronary artery disease.  CHIEF COMPLAINT:  Increasing dyspnea on exertion.  HISTORY OF PRESENT ILLNESS:  I was asked to evaluate this very nice 68 year old reformed smoker with COPD  for recurrent coronary artery disease.  In 2007, he underwent urgent multivessel CABG at [facility] by myself, which included left IMA graft in  the LAD and a sequential vein graft to OM1 and OM2.  He has done well until recently when he developed increasing dyspnea on exertion and decreased exercise tolerance.  He denies associated chest tightness or pressure.  He does have underlying COPD with  some dyspnea on exertion; however, his symptoms were worse than attributed to his pulmonary disease.  For that reason, he was evaluated by his cardiologist, Dr. Clayborn Bigness, who recommended a cardiac catheterization.  This demonstrated a patent sequential  vein graft to OM1 and OM2 but an atretic left IMA.  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  The LAD has a native 95% proximal stenosis.  It appears to be an adequate vessel for grafting.  The patient had right leg saphenous vein harvested for his initial operation.  He has no symptoms of DVT or varicosities of the left leg.  The patient is right-hand dominant.  His left wrist pulse, however, is weakened because of his significant proximal subclavian stenosis.  He denies symptoms of subclavian steal or arm ischemia.  He is right-hand dominant with good pulses in both the  right radial and ulnar vessels.  The patient states he underwent previous CABG x3 in 2007 without significant problems, specifically no pulmonary problems, and was discharged home 5  days postop.  Echocardiogram shows normal LV function.  No significant valvular disease.  The patient's pulmonary disease is significant.  He is on chronic low-dose prednisone and is on intermittent antibiotics frequently.  Currently, he states his COPD symptoms are under good control with low-dose prednisone, and he just finished a course of  antibiotics.  The patient's PFTs have not been reported in several years.  He has not had vascular studies as well, which would be needed prior to redo CABG.  The patient has history of a AAA with a diameter of 4.8 cm being followed by Dr. Tobie Poet at [facility].  The patient's BMI is 32, and he is 5 feet 9 inches and weighs 215 pounds.  The patient is a retired Building control surveyor.  He lives with his family and has a fairly sedentary lifestyle because of chronic bronchitis.  He is not on home oxygen or CPAP.  PAST MEDICAL HISTORY: 1.  Recurrent CAD with 95% stenosis of the proximal LAD and an atretic left IMA graft. 2.  Status post CABG x3 in 2007. 3.  Cervical spine degeneration, status post surgery. 4.  COPD. 5.  GERD. 6.  Hypertension. 7.  Hyperlipidemia. 8.  Hyperglycemia. 9.  Obesity.  PAST SURGICAL HISTORY: 1.  CABG x3 in 2007. 2.  Tonsillectomy and adenoidectomy. 3.  Colonoscopy 2019.  HOME MEDICATIONS:  Albuterol, aspirin 81 mg, currently azithromycin, Symbicort inhaler, cholecalciferol, Allegra, Flonase, Cozaar 25 mg, Glucophage 500 mg b.i.d., Lopressor 50 mg b.i.d., Singulair 10 mg daily, Crestor 40 mg daily, Revatio 20 mg daily,  Theo-Dur 400 mg daily, Valtrex 500 mg daily, and  Cialis 5 mg daily.  ALLERGIES:  SULFA, LIPITOR.  SOCIAL HISTORY:  The patient quit smoking 13 years ago.  He remains active with family life.  REVIEW OF SYSTEMS: GENERAL:  Weight stable at 215. HEENT:  No active dental complaints, difficulty swallowing. THORAX:  No history of thoracic trauma.  Recent episode of bronchitis, improving on Z-Pak and  prednisone. CARDIAC:  Previous CABG.  No history of AFib.  No history of valvular disease. ABDOMEN:  No abdominal pain, jaundice or blood per rectum. GENITOURINARY:  Positive nocturia and BPH. VASCULAR:  No TIA, varicosities, or DVT. NEUROLOGY:  No history of TIA, stroke or seizure.  PHYSICAL EXAMINATION: VITAL SIGNS:  The patient is 5 foot 9 inches and weighs 215 pounds, blood pressure 150/80, pulse 62 and regular.  Saturation on room air is 97%. GENERAL APPEARANCE:  A 68 year old male in no acute distress, oriented x3. HEENT:  Normocephalic.  Pupils equal.  Dentition good. NECK:  No JVD, bruit or adenopathy. CHEST:  Distant breath sounds without wheezing. CARDIAC:  Regular rate and rhythm without murmur.  Well-healed sternal incision. ABDOMEN:  Obese, soft, nontender without pulsatile mass. EXTREMITIES:  No edema.  Scar on right lower leg from vein harvest.  No varicosities in the left leg.  Left radial pulse is weak from the left subclavian stenosis. NEUROLOGIC:  No focal motor deficit.  Alert and oriented x3.  LABORATORY DATA:  I reviewed his coronary angiogram, his echocardiogram images and a recent CT scan of the chest.  CT scan of the chest shows no active inflammatory infectious disease.  The right ventricle is fairly well separated from the sternum for  redo sternotomy.  IMPRESSION AND PLAN:  The patient has symptomatic recurrent coronary artery disease.  He has lost the mammary graft to his left IMA because of a proximal high-grade subclavian stenosis.  This appears to be a very tortuous complex lesion and not easily  remedied.  I would recommend proceeding with redo grafting to the LAD using either radial artery or vein from his left leg.  I doubt the right IMA vessel would be long enough to reach his distal LAD, which will be needed.  Prior to surgery, the patient will need formal PFTs because of his significant COPD and the requirement for redo sternotomy.  He will also need  vein mapping to assess the quality of conduit/saphenous vein in his left leg.  We will also do pre-CABG  Dopplers and upper extremity Dopplers to assess radial artery as conduit and the status of his carotid arteries.  After the patient has completed his PFTs, vein mapping, and pre-CABG Dopplers, he will return to the office to discuss more details at the  time of surgery.  TN/NUANCE D:09/14/2018 T:09/14/2018 TWS:[id number]

## 2018-10-03 MED ORDER — TRANEXAMIC ACID (OHS) PUMP PRIME SOLUTION
2.0000 mg/kg | INTRAVENOUS | Status: DC
  Filled 2018-10-03 (×2): qty 2

## 2018-10-03 MED ORDER — PLASMA-LYTE 148 IV SOLN
INTRAVENOUS | Status: AC
  Administered 2018-10-04: 500 mL
  Filled 2018-10-03 (×2): qty 2.5

## 2018-10-03 MED ORDER — SODIUM CHLORIDE 0.9 % IV SOLN
INTRAVENOUS | Status: DC
  Filled 2018-10-03 (×2): qty 30

## 2018-10-03 MED ORDER — SODIUM CHLORIDE 0.9 % IV SOLN
1.5000 g | INTRAVENOUS | Status: AC
  Administered 2018-10-04: 1.5 g via INTRAVENOUS
  Filled 2018-10-03 (×2): qty 1.5

## 2018-10-03 MED ORDER — TRANEXAMIC ACID (OHS) BOLUS VIA INFUSION
15.0000 mg/kg | INTRAVENOUS | Status: AC
  Administered 2018-10-04: 1498.5 mg via INTRAVENOUS
  Filled 2018-10-03: qty 1499

## 2018-10-03 MED ORDER — DEXMEDETOMIDINE HCL IN NACL 400 MCG/100ML IV SOLN
0.1000 ug/kg/h | INTRAVENOUS | Status: DC
  Filled 2018-10-03 (×2): qty 100

## 2018-10-03 MED ORDER — VANCOMYCIN HCL 10 G IV SOLR
1500.0000 mg | INTRAVENOUS | Status: AC
  Administered 2018-10-04: 1500 mg via INTRAVENOUS
  Filled 2018-10-03 (×2): qty 1500

## 2018-10-03 MED ORDER — PHENYLEPHRINE HCL-NACL 20-0.9 MG/250ML-% IV SOLN
30.0000 ug/min | INTRAVENOUS | Status: DC
  Filled 2018-10-03 (×2): qty 250

## 2018-10-03 MED ORDER — MAGNESIUM SULFATE 50 % IJ SOLN
40.0000 meq | INTRAMUSCULAR | Status: DC
  Filled 2018-10-03 (×2): qty 9.85

## 2018-10-03 MED ORDER — EPINEPHRINE PF 1 MG/ML IJ SOLN
0.0000 ug/min | INTRAVENOUS | Status: DC
  Filled 2018-10-03 (×2): qty 4

## 2018-10-03 MED ORDER — SODIUM CHLORIDE 0.9 % IV SOLN
750.0000 mg | INTRAVENOUS | Status: DC
  Filled 2018-10-03 (×2): qty 750

## 2018-10-03 MED ORDER — DOPAMINE-DEXTROSE 3.2-5 MG/ML-% IV SOLN
0.0000 ug/kg/min | INTRAVENOUS | Status: DC
  Filled 2018-10-03 (×2): qty 250

## 2018-10-03 MED ORDER — NOREPINEPHRINE 4 MG/250ML-% IV SOLN
0.0000 ug/min | INTRAVENOUS | Status: DC
  Filled 2018-10-03 (×2): qty 250

## 2018-10-03 MED ORDER — NITROGLYCERIN IN D5W 200-5 MCG/ML-% IV SOLN
2.0000 ug/min | INTRAVENOUS | Status: DC
  Filled 2018-10-03 (×2): qty 250

## 2018-10-03 MED ORDER — POTASSIUM CHLORIDE 2 MEQ/ML IV SOLN
80.0000 meq | INTRAVENOUS | Status: DC
  Filled 2018-10-03 (×2): qty 40

## 2018-10-03 MED ORDER — INSULIN REGULAR(HUMAN) IN NACL 100-0.9 UT/100ML-% IV SOLN
INTRAVENOUS | Status: DC
  Filled 2018-10-03 (×2): qty 100

## 2018-10-03 MED ORDER — TRANEXAMIC ACID 1000 MG/10ML IV SOLN
1.5000 mg/kg/h | INTRAVENOUS | Status: DC
  Filled 2018-10-03 (×3): qty 25

## 2018-10-03 MED ORDER — MILRINONE LACTATE IN DEXTROSE 20-5 MG/100ML-% IV SOLN
0.3000 ug/kg/min | INTRAVENOUS | Status: DC
  Filled 2018-10-03 (×2): qty 100

## 2018-10-04 ENCOUNTER — Inpatient Hospital Stay (HOSPITAL_COMMUNITY)
Admission: RE | Admit: 2018-10-04 | Discharge: 2018-11-01 | DRG: 236 | Disposition: A | Discharge status: Home Health | Payer: Medicare Other | Attending: Cardiothoracic Surgery | Admitting: Cardiothoracic Surgery

## 2018-10-04 ENCOUNTER — Inpatient Hospital Stay (HOSPITAL_COMMUNITY): Payer: Medicare Other

## 2018-10-04 ENCOUNTER — Other Ambulatory Visit: Payer: Self-pay

## 2018-10-04 ENCOUNTER — Inpatient Hospital Stay (HOSPITAL_COMMUNITY): Payer: Medicare Other | Admitting: Anesthesiology

## 2018-10-04 ENCOUNTER — Inpatient Hospital Stay (HOSPITAL_COMMUNITY)
Admission: RE | Disposition: A | Discharge status: Home / Self Care | Payer: Self-pay | Source: Home / Self Care | Attending: Cardiothoracic Surgery

## 2018-10-04 ENCOUNTER — Encounter (HOSPITAL_COMMUNITY): Payer: Self-pay | Admitting: *Deleted

## 2018-10-04 DIAGNOSIS — D696 Thrombocytopenia, unspecified: Secondary | ICD-10-CM | POA: Diagnosis not present

## 2018-10-04 DIAGNOSIS — E875 Hyperkalemia: Secondary | ICD-10-CM | POA: Diagnosis not present

## 2018-10-04 DIAGNOSIS — F419 Anxiety disorder, unspecified: Secondary | ICD-10-CM | POA: Diagnosis not present

## 2018-10-04 DIAGNOSIS — Z888 Allergy status to other drugs, medicaments and biological substances status: Secondary | ICD-10-CM

## 2018-10-04 DIAGNOSIS — D62 Acute posthemorrhagic anemia: Secondary | ICD-10-CM | POA: Diagnosis not present

## 2018-10-04 DIAGNOSIS — I25720 Atherosclerosis of autologous artery coronary artery bypass graft(s) with unstable angina pectoris: Principal | ICD-10-CM | POA: Diagnosis present

## 2018-10-04 DIAGNOSIS — L309 Dermatitis, unspecified: Secondary | ICD-10-CM | POA: Diagnosis present

## 2018-10-04 DIAGNOSIS — Z1159 Encounter for screening for other viral diseases: Secondary | ICD-10-CM | POA: Diagnosis not present

## 2018-10-04 DIAGNOSIS — K120 Recurrent oral aphthae: Secondary | ICD-10-CM | POA: Diagnosis not present

## 2018-10-04 DIAGNOSIS — Z9889 Other specified postprocedural states: Secondary | ICD-10-CM

## 2018-10-04 DIAGNOSIS — M1990 Unspecified osteoarthritis, unspecified site: Secondary | ICD-10-CM | POA: Diagnosis present

## 2018-10-04 DIAGNOSIS — T8182XA Emphysema (subcutaneous) resulting from a procedure, initial encounter: Secondary | ICD-10-CM | POA: Diagnosis not present

## 2018-10-04 DIAGNOSIS — R0600 Dyspnea, unspecified: Secondary | ICD-10-CM | POA: Diagnosis present

## 2018-10-04 DIAGNOSIS — J441 Chronic obstructive pulmonary disease with (acute) exacerbation: Secondary | ICD-10-CM | POA: Diagnosis not present

## 2018-10-04 DIAGNOSIS — I10 Essential (primary) hypertension: Secondary | ICD-10-CM | POA: Diagnosis present

## 2018-10-04 DIAGNOSIS — I714 Abdominal aortic aneurysm, without rupture: Secondary | ICD-10-CM | POA: Diagnosis present

## 2018-10-04 DIAGNOSIS — J939 Pneumothorax, unspecified: Secondary | ICD-10-CM

## 2018-10-04 DIAGNOSIS — Z981 Arthrodesis status: Secondary | ICD-10-CM | POA: Diagnosis not present

## 2018-10-04 DIAGNOSIS — J95811 Postprocedural pneumothorax: Secondary | ICD-10-CM | POA: Diagnosis not present

## 2018-10-04 DIAGNOSIS — Z882 Allergy status to sulfonamides status: Secondary | ICD-10-CM | POA: Diagnosis not present

## 2018-10-04 DIAGNOSIS — Z87891 Personal history of nicotine dependence: Secondary | ICD-10-CM | POA: Diagnosis not present

## 2018-10-04 DIAGNOSIS — K219 Gastro-esophageal reflux disease without esophagitis: Secondary | ICD-10-CM | POA: Diagnosis present

## 2018-10-04 DIAGNOSIS — Z4682 Encounter for fitting and adjustment of non-vascular catheter: Secondary | ICD-10-CM

## 2018-10-04 DIAGNOSIS — Z7984 Long term (current) use of oral hypoglycemic drugs: Secondary | ICD-10-CM

## 2018-10-04 DIAGNOSIS — I25119 Atherosclerotic heart disease of native coronary artery with unspecified angina pectoris: Secondary | ICD-10-CM | POA: Diagnosis present

## 2018-10-04 DIAGNOSIS — I2510 Atherosclerotic heart disease of native coronary artery without angina pectoris: Secondary | ICD-10-CM

## 2018-10-04 DIAGNOSIS — E876 Hypokalemia: Secondary | ICD-10-CM | POA: Diagnosis not present

## 2018-10-04 DIAGNOSIS — E1151 Type 2 diabetes mellitus with diabetic peripheral angiopathy without gangrene: Secondary | ICD-10-CM | POA: Diagnosis present

## 2018-10-04 DIAGNOSIS — E785 Hyperlipidemia, unspecified: Secondary | ICD-10-CM | POA: Diagnosis present

## 2018-10-04 DIAGNOSIS — R0602 Shortness of breath: Secondary | ICD-10-CM

## 2018-10-04 DIAGNOSIS — E7800 Pure hypercholesterolemia, unspecified: Secondary | ICD-10-CM | POA: Diagnosis present

## 2018-10-04 DIAGNOSIS — Z419 Encounter for procedure for purposes other than remedying health state, unspecified: Secondary | ICD-10-CM

## 2018-10-04 DIAGNOSIS — E8770 Fluid overload, unspecified: Secondary | ICD-10-CM | POA: Diagnosis not present

## 2018-10-04 DIAGNOSIS — Z951 Presence of aortocoronary bypass graft: Secondary | ICD-10-CM

## 2018-10-04 DIAGNOSIS — Z9689 Presence of other specified functional implants: Secondary | ICD-10-CM

## 2018-10-04 HISTORY — PX: TEE WITHOUT CARDIOVERSION: SHX5443

## 2018-10-04 HISTORY — PX: CORONARY ARTERY BYPASS GRAFT: SHX141

## 2018-10-04 LAB — CREATININE, SERUM
Creatinine, Ser: 1.05 mg/dL (ref 0.61–1.24)
GFR calc Af Amer: >60 mL/min (ref >60)
GFR calc non Af Amer: >60 mL/min (ref >60)

## 2018-10-04 LAB — POCT I-STAT 7, (LYTES, BLD GAS, ICA,H+H)
Acid-Base Excess: 4 mmol/L — ABNORMAL HIGH (ref 0.0–2.0)
Acid-Base Excess: 3 mmol/L — ABNORMAL HIGH (ref 0.0–2.0)
Acid-base deficit: 2 mmol/L (ref 0.0–2.0)
Acid-base deficit: 3 mmol/L — ABNORMAL HIGH (ref 0.0–2.0)
Acid-base deficit: 3 mmol/L — ABNORMAL HIGH (ref 0.0–2.0)
Bicarbonate: 27.2 mmol/L (ref 20.0–28.0)
Bicarbonate: 28.3 mmol/L — ABNORMAL HIGH (ref 20.0–28.0)
Bicarbonate: 25.8 mmol/L (ref 20.0–28.0)
Bicarbonate: 24.1 mmol/L (ref 20.0–28.0)
Bicarbonate: 23.4 mmol/L (ref 20.0–28.0)
Bicarbonate: 23.1 mmol/L (ref 20.0–28.0)
Calcium, Ion: 0.94 mmol/L — ABNORMAL LOW (ref 1.15–1.40)
Calcium, Ion: 1.06 mmol/L — ABNORMAL LOW (ref 1.15–1.40)
Calcium, Ion: 1.09 mmol/L — ABNORMAL LOW (ref 1.15–1.40)
Calcium, Ion: 1.13 mmol/L — ABNORMAL LOW (ref 1.15–1.40)
Calcium, Ion: 1.13 mmol/L — ABNORMAL LOW (ref 1.15–1.40)
Calcium, Ion: 1.12 mmol/L — ABNORMAL LOW (ref 1.15–1.40)
HCT: 25 % — ABNORMAL LOW (ref 39.0–52.0)
HCT: 26 % — ABNORMAL LOW (ref 39.0–52.0)
HCT: 29 % — ABNORMAL LOW (ref 39.0–52.0)
HCT: 30 % — ABNORMAL LOW (ref 39.0–52.0)
HCT: 31 % — ABNORMAL LOW (ref 39.0–52.0)
HCT: 31 % — ABNORMAL LOW (ref 39.0–52.0)
Hemoglobin: 8.5 g/dL — ABNORMAL LOW (ref 13.0–17.0)
Hemoglobin: 8.8 g/dL — ABNORMAL LOW (ref 13.0–17.0)
Hemoglobin: 9.9 g/dL — ABNORMAL LOW (ref 13.0–17.0)
Hemoglobin: 10.2 g/dL — ABNORMAL LOW (ref 13.0–17.0)
Hemoglobin: 10.5 g/dL — ABNORMAL LOW (ref 13.0–17.0)
Hemoglobin: 10.5 g/dL — ABNORMAL LOW (ref 13.0–17.0)
O2 Saturation: 100 %
O2 Saturation: 100 %
O2 Saturation: 99 %
O2 Saturation: 98 %
O2 Saturation: 98 %
O2 Saturation: 97 %
Patient temperature: 35.2
Patient temperature: 36.5
Patient temperature: 36.5
Patient temperature: 36.8
Potassium: 4.1 mmol/L (ref 3.5–5.1)
Potassium: 3.9 mmol/L (ref 3.5–5.1)
Potassium: 4.1 mmol/L (ref 3.5–5.1)
Potassium: 4.8 mmol/L (ref 3.5–5.1)
Potassium: 4.9 mmol/L (ref 3.5–5.1)
Potassium: 4.9 mmol/L (ref 3.5–5.1)
Sodium: 137 mmol/L (ref 135–145)
Sodium: 141 mmol/L (ref 135–145)
Sodium: 142 mmol/L (ref 135–145)
Sodium: 140 mmol/L (ref 135–145)
Sodium: 140 mmol/L (ref 135–145)
Sodium: 140 mmol/L (ref 135–145)
TCO2: 28 mmol/L (ref 22–32)
TCO2: 30 mmol/L (ref 22–32)
TCO2: 27 mmol/L (ref 22–32)
TCO2: 25 mmol/L (ref 22–32)
TCO2: 25 mmol/L (ref 22–32)
TCO2: 24 mmol/L (ref 22–32)
pCO2 arterial: 35.5 mmHg (ref 32.0–48.0)
pCO2 arterial: 46.4 mmHg (ref 32.0–48.0)
pCO2 arterial: 45 mmHg (ref 32.0–48.0)
pCO2 arterial: 45.9 mmHg (ref 32.0–48.0)
pCO2 arterial: 43.5 mmHg (ref 32.0–48.0)
pCO2 arterial: 42.7 mmHg (ref 32.0–48.0)
pH, Arterial: 7.492 — ABNORMAL HIGH (ref 7.350–7.450)
pH, Arterial: 7.394 (ref 7.350–7.450)
pH, Arterial: 7.358 (ref 7.350–7.450)
pH, Arterial: 7.326 — ABNORMAL LOW (ref 7.350–7.450)
pH, Arterial: 7.337 — ABNORMAL LOW (ref 7.350–7.450)
pH, Arterial: 7.34 — ABNORMAL LOW (ref 7.350–7.450)
pO2, Arterial: 354 mmHg — ABNORMAL HIGH (ref 83.0–108.0)
pO2, Arterial: 478 mmHg — ABNORMAL HIGH (ref 83.0–108.0)
pO2, Arterial: 133 mmHg — ABNORMAL HIGH (ref 83.0–108.0)
pO2, Arterial: 115 mmHg — ABNORMAL HIGH (ref 83.0–108.0)
pO2, Arterial: 116 mmHg — ABNORMAL HIGH (ref 83.0–108.0)
pO2, Arterial: 97 mmHg (ref 83.0–108.0)

## 2018-10-04 LAB — GLUCOSE, CAPILLARY
Glucose-Capillary: 115 mg/dL — ABNORMAL HIGH (ref 70–99)
Glucose-Capillary: 148 mg/dL — ABNORMAL HIGH (ref 70–99)
Glucose-Capillary: 144 mg/dL — ABNORMAL HIGH (ref 70–99)
Glucose-Capillary: 138 mg/dL — ABNORMAL HIGH (ref 70–99)
Glucose-Capillary: 132 mg/dL — ABNORMAL HIGH (ref 70–99)
Glucose-Capillary: 135 mg/dL — ABNORMAL HIGH (ref 70–99)
Glucose-Capillary: 136 mg/dL — ABNORMAL HIGH (ref 70–99)
Glucose-Capillary: 180 mg/dL — ABNORMAL HIGH (ref 70–99)
Glucose-Capillary: 155 mg/dL — ABNORMAL HIGH (ref 70–99)

## 2018-10-04 LAB — POCT I-STAT 4, (NA,K, GLUC, HGB,HCT)
Glucose, Bld: 108 mg/dL — ABNORMAL HIGH (ref 70–99)
Glucose, Bld: 114 mg/dL — ABNORMAL HIGH (ref 70–99)
Glucose, Bld: 105 mg/dL — ABNORMAL HIGH (ref 70–99)
Glucose, Bld: 106 mg/dL — ABNORMAL HIGH (ref 70–99)
Glucose, Bld: 117 mg/dL — ABNORMAL HIGH (ref 70–99)
HCT: 40 % (ref 39.0–52.0)
HCT: 32 % — ABNORMAL LOW (ref 39.0–52.0)
HCT: 24 % — ABNORMAL LOW (ref 39.0–52.0)
HCT: 27 % — ABNORMAL LOW (ref 39.0–52.0)
HCT: 26 % — ABNORMAL LOW (ref 39.0–52.0)
Hemoglobin: 13.6 g/dL (ref 13.0–17.0)
Hemoglobin: 10.9 g/dL — ABNORMAL LOW (ref 13.0–17.0)
Hemoglobin: 8.2 g/dL — ABNORMAL LOW (ref 13.0–17.0)
Hemoglobin: 9.2 g/dL — ABNORMAL LOW (ref 13.0–17.0)
Hemoglobin: 8.8 g/dL — ABNORMAL LOW (ref 13.0–17.0)
Potassium: 4.3 mmol/L (ref 3.5–5.1)
Potassium: 4.2 mmol/L (ref 3.5–5.1)
Potassium: 4.3 mmol/L (ref 3.5–5.1)
Potassium: 3.8 mmol/L (ref 3.5–5.1)
Potassium: 3.9 mmol/L (ref 3.5–5.1)
Sodium: 140 mmol/L (ref 135–145)
Sodium: 140 mmol/L (ref 135–145)
Sodium: 140 mmol/L (ref 135–145)
Sodium: 142 mmol/L (ref 135–145)
Sodium: 142 mmol/L (ref 135–145)

## 2018-10-04 LAB — CBC
HCT: 27.4 % — ABNORMAL LOW (ref 39.0–52.0)
HCT: 32.9 % — ABNORMAL LOW (ref 39.0–52.0)
Hemoglobin: 8.8 g/dL — ABNORMAL LOW (ref 13.0–17.0)
Hemoglobin: 10.8 g/dL — ABNORMAL LOW (ref 13.0–17.0)
MCH: 31.8 pg (ref 26.0–34.0)
MCH: 32.1 pg (ref 26.0–34.0)
MCHC: 32.1 g/dL (ref 30.0–36.0)
MCHC: 32.8 g/dL (ref 30.0–36.0)
MCV: 98.9 fL (ref 80.0–100.0)
MCV: 97.9 fL (ref 80.0–100.0)
Platelets: 84 10*3/uL — ABNORMAL LOW (ref 150–400)
Platelets: 125 10*3/uL — ABNORMAL LOW (ref 150–400)
RBC: 2.77 MIL/uL — ABNORMAL LOW (ref 4.22–5.81)
RBC: 3.36 MIL/uL — ABNORMAL LOW (ref 4.22–5.81)
RDW: 13.3 % (ref 11.5–15.5)
RDW: 13.2 % (ref 11.5–15.5)
WBC: 5.6 10*3/uL (ref 4.0–10.5)
WBC: 15.6 10*3/uL — ABNORMAL HIGH (ref 4.0–10.5)
nRBC: 0 % (ref 0.0–0.2)
nRBC: 0 % (ref 0.0–0.2)

## 2018-10-04 LAB — ECHO INTRAOPERATIVE TEE
Height: 69 in
Weight: 3523.2 oz

## 2018-10-04 LAB — APTT: aPTT: 32 seconds (ref 24–36)

## 2018-10-04 LAB — HEMOGLOBIN AND HEMATOCRIT, BLOOD
HCT: 25.5 % — ABNORMAL LOW (ref 39.0–52.0)
Hemoglobin: 8.4 g/dL — ABNORMAL LOW (ref 13.0–17.0)

## 2018-10-04 LAB — PLATELET COUNT: Platelets: 86 10*3/uL — ABNORMAL LOW (ref 150–400)

## 2018-10-04 LAB — PREPARE RBC (CROSSMATCH)

## 2018-10-04 LAB — MAGNESIUM: Magnesium: 2.9 mg/dL — ABNORMAL HIGH (ref 1.7–2.4)

## 2018-10-04 LAB — PROTIME-INR
INR: 1.6 — ABNORMAL HIGH (ref 0.8–1.2)
Prothrombin Time: 18.6 seconds — ABNORMAL HIGH (ref 11.4–15.2)

## 2018-10-04 SURGERY — REDO CORONARY ARTERY BYPASS GRAFTING (CABG)
Anesthesia: General | Site: Chest

## 2018-10-04 MED ORDER — CHLORHEXIDINE GLUCONATE 0.12 % MT SOLN
15.0000 mL | Freq: Once | OROMUCOSAL | Status: AC
  Administered 2018-10-04: 15 mL via OROMUCOSAL

## 2018-10-04 MED ORDER — BISACODYL 10 MG RE SUPP: 10.0000 mg | Freq: Every day | RECTAL | Status: DC

## 2018-10-04 MED ORDER — METOPROLOL TARTRATE 12.5 MG HALF TABLET: 12.5000 mg | ORAL_TABLET | Freq: Once | ORAL | Status: DC

## 2018-10-04 MED ORDER — SODIUM CHLORIDE 0.9 % IV SOLN
INTRAVENOUS | Status: DC | PRN
  Administered 2018-10-04: 1 [IU]/h via INTRAVENOUS

## 2018-10-04 MED ORDER — ALBUTEROL SULFATE HFA 108 (90 BASE) MCG/ACT IN AERS: 2.0000 | INHALATION_SPRAY | Freq: Four times a day (QID) | RESPIRATORY_TRACT | Status: DC | PRN

## 2018-10-04 MED ORDER — SODIUM CHLORIDE 0.9 % IV SOLN
INTRAVENOUS | Status: DC | PRN
  Administered 2018-10-04: 30 ug/min via INTRAVENOUS

## 2018-10-04 MED ORDER — SODIUM CHLORIDE 0.9 % IV SOLN
INTRAVENOUS | Status: DC
  Administered 2018-10-04: 13:00:00 via INTRAVENOUS

## 2018-10-04 MED ORDER — FAMOTIDINE IN NACL 20-0.9 MG/50ML-% IV SOLN
20.0000 mg | Freq: Two times a day (BID) | INTRAVENOUS | Status: AC
  Administered 2018-10-04: 20 mg via INTRAVENOUS

## 2018-10-04 MED ORDER — NITROGLYCERIN IN D5W 200-5 MCG/ML-% IV SOLN: 0.0000 ug/min | INTRAVENOUS | Status: DC

## 2018-10-04 MED ORDER — ONDANSETRON HCL 4 MG/2ML IJ SOLN
INTRAMUSCULAR | Status: AC
  Filled 2018-10-04: qty 2

## 2018-10-04 MED ORDER — ALBUTEROL SULFATE (2.5 MG/3ML) 0.083% IN NEBU: 2.5000 mg | INHALATION_SOLUTION | Freq: Four times a day (QID) | RESPIRATORY_TRACT | Status: DC

## 2018-10-04 MED ORDER — MIDAZOLAM HCL 2 MG/2ML IJ SOLN: 2.0000 mg | INTRAMUSCULAR | Status: DC | PRN

## 2018-10-04 MED ORDER — LACTATED RINGERS IV SOLN
INTRAVENOUS | Status: DC
  Administered 2018-10-05: via INTRAVENOUS

## 2018-10-04 MED ORDER — MAGNESIUM SULFATE 4 GM/100ML IV SOLN
4.0000 g | Freq: Once | INTRAVENOUS | Status: AC
  Administered 2018-10-04: 4 g via INTRAVENOUS
  Filled 2018-10-04: qty 100

## 2018-10-04 MED ORDER — METOPROLOL TARTRATE 5 MG/5ML IV SOLN
2.5000 mg | INTRAVENOUS | Status: DC | PRN
  Administered 2018-10-05 – 2018-10-12 (×3): 5 mg via INTRAVENOUS
  Administered 2018-10-22: 2.5 mg via INTRAVENOUS
  Filled 2018-10-04 (×4): qty 5

## 2018-10-04 MED ORDER — CHLORHEXIDINE GLUCONATE 0.12% ORAL RINSE (MEDLINE KIT)
15.0000 mL | Freq: Two times a day (BID) | OROMUCOSAL | Status: DC
  Administered 2018-10-04 – 2018-10-06 (×5): 15 mL via OROMUCOSAL

## 2018-10-04 MED ORDER — MIDAZOLAM HCL 5 MG/5ML IJ SOLN
INTRAMUSCULAR | Status: DC | PRN
  Administered 2018-10-04: 5 mg via INTRAVENOUS
  Administered 2018-10-04: 2 mg via INTRAVENOUS
  Administered 2018-10-04: 3 mg via INTRAVENOUS

## 2018-10-04 MED ORDER — DIPHENHYDRAMINE HCL 50 MG/ML IJ SOLN
INTRAMUSCULAR | Status: AC
  Filled 2018-10-04: qty 1

## 2018-10-04 MED ORDER — CHLORHEXIDINE GLUCONATE CLOTH 2 % EX PADS
6.0000 | MEDICATED_PAD | Freq: Every day | CUTANEOUS | Status: DC
  Administered 2018-10-04 – 2018-11-01 (×21): 6 via TOPICAL

## 2018-10-04 MED ORDER — ROCURONIUM BROMIDE 10 MG/ML (PF) SYRINGE
PREFILLED_SYRINGE | INTRAVENOUS | Status: DC | PRN
  Administered 2018-10-04: 100 mg via INTRAVENOUS
  Administered 2018-10-04: 50 mg via INTRAVENOUS

## 2018-10-04 MED ORDER — METOCLOPRAMIDE HCL 5 MG/ML IJ SOLN
10.0000 mg | Freq: Four times a day (QID) | INTRAMUSCULAR | Status: AC
  Administered 2018-10-04 – 2018-10-05 (×4): 10 mg via INTRAVENOUS
  Filled 2018-10-04 (×3): qty 2

## 2018-10-04 MED ORDER — HEMOSTATIC AGENTS (NO CHARGE) OPTIME
TOPICAL | Status: DC | PRN
  Administered 2018-10-04 (×3): 1 via TOPICAL

## 2018-10-04 MED ORDER — FLUTICASONE PROPIONATE 50 MCG/ACT NA SUSP
1.0000 | Freq: Every day | NASAL | Status: DC
  Administered 2018-10-05 – 2018-10-30 (×23): 1 via NASAL
  Filled 2018-10-04 (×2): qty 16

## 2018-10-04 MED ORDER — ALBUMIN HUMAN 5 % IV SOLN
250.0000 mL | INTRAVENOUS | Status: AC | PRN
  Administered 2018-10-04: 12.5 g via INTRAVENOUS

## 2018-10-04 MED ORDER — DEXMEDETOMIDINE HCL IN NACL 200 MCG/50ML IV SOLN: 0.0000 ug/kg/h | INTRAVENOUS | Status: DC

## 2018-10-04 MED ORDER — SODIUM CHLORIDE 0.9 % IV SOLN: INTRAVENOUS | Status: DC | PRN

## 2018-10-04 MED ORDER — NOREPINEPHRINE 4 MG/250ML-% IV SOLN: 0.0000 ug/min | INTRAVENOUS | Status: DC

## 2018-10-04 MED ORDER — MONTELUKAST SODIUM 10 MG PO TABS
10.0000 mg | ORAL_TABLET | Freq: Every evening | ORAL | Status: DC
  Administered 2018-10-05 – 2018-10-31 (×27): 10 mg via ORAL
  Filled 2018-10-04 (×27): qty 1

## 2018-10-04 MED ORDER — ACETAMINOPHEN 500 MG PO TABS
1000.0000 mg | ORAL_TABLET | Freq: Four times a day (QID) | ORAL | Status: AC
  Administered 2018-10-05 – 2018-10-09 (×20): 1000 mg via ORAL
  Filled 2018-10-04 (×19): qty 2

## 2018-10-04 MED ORDER — SODIUM CHLORIDE 0.9% FLUSH
10.0000 mL | Freq: Two times a day (BID) | INTRAVENOUS | Status: DC
  Administered 2018-10-04 – 2018-10-07 (×7): 10 mL

## 2018-10-04 MED ORDER — MILRINONE LACTATE IN DEXTROSE 20-5 MG/100ML-% IV SOLN
0.2500 ug/kg/min | INTRAVENOUS | Status: DC
  Administered 2018-10-04 – 2018-10-06 (×3): 0.25 ug/kg/min via INTRAVENOUS
  Filled 2018-10-04 (×3): qty 100

## 2018-10-04 MED ORDER — ACETAMINOPHEN 160 MG/5ML PO SOLN: 650.0000 mg | Freq: Once | ORAL | Status: AC

## 2018-10-04 MED ORDER — DEXAMETHASONE SODIUM PHOSPHATE 10 MG/ML IJ SOLN
INTRAMUSCULAR | Status: AC
  Filled 2018-10-04: qty 1

## 2018-10-04 MED ORDER — SODIUM CHLORIDE (PF) 0.9 % IJ SOLN
INTRAMUSCULAR | Status: AC
  Filled 2018-10-04: qty 20

## 2018-10-04 MED ORDER — HEPARIN SODIUM (PORCINE) 1000 UNIT/ML IJ SOLN
INTRAMUSCULAR | Status: DC | PRN
  Administered 2018-10-04: 31000 [IU] via INTRAVENOUS
  Administered 2018-10-04: 2000 [IU] via INTRAVENOUS

## 2018-10-04 MED ORDER — VANCOMYCIN HCL IN DEXTROSE 1-5 GM/200ML-% IV SOLN
1000.0000 mg | Freq: Two times a day (BID) | INTRAVENOUS | Status: AC
  Administered 2018-10-04 – 2018-10-05 (×2): 1000 mg via INTRAVENOUS
  Filled 2018-10-04 (×3): qty 200

## 2018-10-04 MED ORDER — SODIUM CHLORIDE 0.9 % IV SOLN
20.0000 ug | Freq: Once | INTRAVENOUS | Status: DC
  Filled 2018-10-04 (×2): qty 5

## 2018-10-04 MED ORDER — ALBUTEROL SULFATE (2.5 MG/3ML) 0.083% IN NEBU
2.5000 mg | INHALATION_SOLUTION | Freq: Four times a day (QID) | RESPIRATORY_TRACT | Status: DC
  Administered 2018-10-04 – 2018-10-05 (×5): 2.5 mg via RESPIRATORY_TRACT
  Filled 2018-10-04 (×5): qty 3

## 2018-10-04 MED ORDER — NOREPINEPHRINE BITARTRATE 1 MG/ML IV SOLN
INTRAVENOUS | Status: DC | PRN
  Administered 2018-10-04: 2 ug/min via INTRAVENOUS

## 2018-10-04 MED ORDER — FENTANYL CITRATE (PF) 250 MCG/5ML IJ SOLN
INTRAMUSCULAR | Status: AC
  Filled 2018-10-04: qty 5

## 2018-10-04 MED ORDER — SODIUM CHLORIDE (PF) 0.9 % IJ SOLN
OROMUCOSAL | Status: DC | PRN
  Administered 2018-10-04 (×3): 4 mL via TOPICAL

## 2018-10-04 MED ORDER — UMECLIDINIUM BROMIDE 62.5 MCG/INH IN AEPB
1.0000 | INHALATION_SPRAY | Freq: Every day | RESPIRATORY_TRACT | Status: DC
  Administered 2018-10-05 – 2018-10-07 (×3): 1 via RESPIRATORY_TRACT
  Filled 2018-10-04: qty 7

## 2018-10-04 MED ORDER — CHLORHEXIDINE GLUCONATE 4 % EX LIQD: 30.0000 mL | CUTANEOUS | Status: DC

## 2018-10-04 MED ORDER — PHENYLEPHRINE HCL-NACL 20-0.9 MG/250ML-% IV SOLN: 0.0000 ug/min | INTRAVENOUS | Status: DC

## 2018-10-04 MED ORDER — SODIUM CHLORIDE 0.45 % IV SOLN: INTRAVENOUS | Status: DC | PRN

## 2018-10-04 MED ORDER — DOCUSATE SODIUM 100 MG PO CAPS
200.0000 mg | ORAL_CAPSULE | Freq: Every day | ORAL | Status: DC
  Administered 2018-10-05 – 2018-10-29 (×19): 200 mg via ORAL
  Filled 2018-10-04 (×22): qty 2

## 2018-10-04 MED ORDER — LACTATED RINGERS IV SOLN
INTRAVENOUS | Status: DC | PRN
  Administered 2018-10-04: 07:00:00 via INTRAVENOUS

## 2018-10-04 MED ORDER — SODIUM CHLORIDE 0.9% FLUSH: 3.0000 mL | INTRAVENOUS | Status: DC | PRN

## 2018-10-04 MED ORDER — PROPOFOL 10 MG/ML IV BOLUS
INTRAVENOUS | Status: DC | PRN
  Administered 2018-10-04: 70 mg via INTRAVENOUS

## 2018-10-04 MED ORDER — MILRINONE LACTATE IN DEXTROSE 20-5 MG/100ML-% IV SOLN
INTRAVENOUS | Status: DC | PRN
  Administered 2018-10-04: 0.125 ug/kg/min via INTRAVENOUS

## 2018-10-04 MED ORDER — MIDAZOLAM HCL (PF) 10 MG/2ML IJ SOLN
INTRAMUSCULAR | Status: AC
  Filled 2018-10-04: qty 2

## 2018-10-04 MED ORDER — PROPOFOL 10 MG/ML IV BOLUS
INTRAVENOUS | Status: AC
  Filled 2018-10-04: qty 20

## 2018-10-04 MED ORDER — SODIUM CHLORIDE 0.9 % IV SOLN
1.5000 g | Freq: Two times a day (BID) | INTRAVENOUS | Status: AC
  Administered 2018-10-04 – 2018-10-06 (×4): 1.5 g via INTRAVENOUS
  Filled 2018-10-04 (×4): qty 1.5

## 2018-10-04 MED ORDER — ACETAMINOPHEN 160 MG/5ML PO SOLN: 1000.0000 mg | Freq: Four times a day (QID) | ORAL | Status: AC

## 2018-10-04 MED ORDER — LIDOCAINE 2% (20 MG/ML) 5 ML SYRINGE
INTRAMUSCULAR | Status: AC
  Filled 2018-10-04: qty 5

## 2018-10-04 MED ORDER — 0.9 % SODIUM CHLORIDE (POUR BTL) OPTIME
TOPICAL | Status: DC | PRN
  Administered 2018-10-04: 5000 mL

## 2018-10-04 MED ORDER — AZITHROMYCIN 500 MG PO TABS
250.0000 mg | ORAL_TABLET | ORAL | Status: DC
  Administered 2018-10-05 – 2018-10-31 (×11): 250 mg via ORAL
  Filled 2018-10-04 (×13): qty 1

## 2018-10-04 MED ORDER — DEXAMETHASONE SODIUM PHOSPHATE 10 MG/ML IJ SOLN
INTRAMUSCULAR | Status: DC | PRN
  Administered 2018-10-04: 5 mg via INTRAVENOUS

## 2018-10-04 MED ORDER — SODIUM CHLORIDE 0.9 % IV SOLN
INTRAVENOUS | Status: DC | PRN
  Administered 2018-10-04: 750 mg via INTRAVENOUS

## 2018-10-04 MED ORDER — ONDANSETRON HCL 4 MG/2ML IJ SOLN
INTRAMUSCULAR | Status: DC | PRN
  Administered 2018-10-04: 4 mg via INTRAVENOUS

## 2018-10-04 MED ORDER — FENTANYL CITRATE (PF) 100 MCG/2ML IJ SOLN
INTRAMUSCULAR | Status: DC | PRN
  Administered 2018-10-04 (×2): 250 ug via INTRAVENOUS
  Administered 2018-10-04: 200 ug via INTRAVENOUS
  Administered 2018-10-04: 50 ug via INTRAVENOUS
  Administered 2018-10-04 (×2): 150 ug via INTRAVENOUS
  Administered 2018-10-04: 100 ug via INTRAVENOUS
  Administered 2018-10-04: 50 ug via INTRAVENOUS
  Administered 2018-10-04: 100 ug via INTRAVENOUS

## 2018-10-04 MED ORDER — VALACYCLOVIR HCL 500 MG PO TABS
500.0000 mg | ORAL_TABLET | Freq: Every evening | ORAL | Status: DC
  Administered 2018-10-05 – 2018-10-31 (×26): 500 mg via ORAL
  Filled 2018-10-04 (×29): qty 1

## 2018-10-04 MED ORDER — ARTIFICIAL TEARS OPHTHALMIC OINT
TOPICAL_OINTMENT | OPHTHALMIC | Status: AC
  Filled 2018-10-04: qty 3.5

## 2018-10-04 MED ORDER — AZELASTINE HCL 0.1 % NA SOLN
1.0000 | Freq: Two times a day (BID) | NASAL | Status: DC
  Filled 2018-10-04: qty 30

## 2018-10-04 MED ORDER — POTASSIUM CHLORIDE 10 MEQ/50ML IV SOLN
10.0000 meq | INTRAVENOUS | Status: AC
  Administered 2018-10-04 (×3): 10 meq via INTRAVENOUS

## 2018-10-04 MED ORDER — DOPAMINE-DEXTROSE 3.2-5 MG/ML-% IV SOLN: 2.0000 ug/kg/min | INTRAVENOUS | Status: DC

## 2018-10-04 MED ORDER — ROSUVASTATIN CALCIUM 20 MG PO TABS
40.0000 mg | ORAL_TABLET | Freq: Every day | ORAL | Status: DC
  Administered 2018-10-05 – 2018-11-01 (×27): 40 mg via ORAL
  Filled 2018-10-04 (×27): qty 2

## 2018-10-04 MED ORDER — OXYCODONE HCL 5 MG PO TABS
5.0000 mg | ORAL_TABLET | ORAL | Status: DC | PRN
  Administered 2018-10-05 – 2018-10-06 (×5): 10 mg via ORAL
  Administered 2018-10-14 – 2018-10-15 (×2): 5 mg via ORAL
  Filled 2018-10-04: qty 1
  Filled 2018-10-04 (×3): qty 2
  Filled 2018-10-04: qty 1
  Filled 2018-10-04: qty 2
  Filled 2018-10-04: qty 1
  Filled 2018-10-04: qty 2

## 2018-10-04 MED ORDER — TIOTROPIUM BROMIDE MONOHYDRATE 2.5 MCG/ACT IN AERS: 2.0000 | INHALATION_SPRAY | Freq: Every day | RESPIRATORY_TRACT | Status: DC

## 2018-10-04 MED ORDER — LIDOCAINE 2% (20 MG/ML) 5 ML SYRINGE
INTRAMUSCULAR | Status: DC | PRN
  Administered 2018-10-04: 100 mg via INTRAVENOUS

## 2018-10-04 MED ORDER — TRAMADOL HCL 50 MG PO TABS
50.0000 mg | ORAL_TABLET | ORAL | Status: DC | PRN
  Administered 2018-10-10: 100 mg via ORAL
  Administered 2018-10-11: 19:00:00 50 mg via ORAL
  Administered 2018-10-12 – 2018-10-14 (×5): 100 mg via ORAL
  Administered 2018-10-15: 12:00:00 50 mg via ORAL
  Administered 2018-10-17 – 2018-10-25 (×2): 100 mg via ORAL
  Filled 2018-10-04 (×5): qty 2
  Filled 2018-10-04 (×2): qty 1
  Filled 2018-10-04 (×3): qty 2

## 2018-10-04 MED ORDER — ARTIFICIAL TEARS OPHTHALMIC OINT
TOPICAL_OINTMENT | OPHTHALMIC | Status: DC | PRN
  Administered 2018-10-04: 1 via OPHTHALMIC

## 2018-10-04 MED ORDER — METOPROLOL TARTRATE 25 MG/10 ML ORAL SUSPENSION: 12.5000 mg | Freq: Two times a day (BID) | ORAL | Status: DC

## 2018-10-04 MED ORDER — MORPHINE SULFATE (PF) 2 MG/ML IV SOLN
1.0000 mg | INTRAVENOUS | Status: DC | PRN
  Administered 2018-10-04 – 2018-10-06 (×2): 4 mg via INTRAVENOUS
  Administered 2018-10-07: 2 mg via INTRAVENOUS
  Administered 2018-10-08: 4 mg via INTRAVENOUS
  Administered 2018-10-09 (×2): 2 mg via INTRAVENOUS
  Filled 2018-10-04: qty 2
  Filled 2018-10-04: qty 1
  Filled 2018-10-04: qty 2
  Filled 2018-10-04 (×2): qty 1
  Filled 2018-10-04: qty 2

## 2018-10-04 MED ORDER — BISACODYL 5 MG PO TBEC
10.0000 mg | DELAYED_RELEASE_TABLET | Freq: Every day | ORAL | Status: DC
  Administered 2018-10-05 – 2018-10-29 (×16): 10 mg via ORAL
  Filled 2018-10-04 (×20): qty 2

## 2018-10-04 MED ORDER — ASPIRIN 81 MG PO CHEW: 324.0000 mg | CHEWABLE_TABLET | Freq: Every day | ORAL | Status: DC

## 2018-10-04 MED ORDER — ASPIRIN EC 325 MG PO TBEC
325.0000 mg | DELAYED_RELEASE_TABLET | Freq: Every day | ORAL | Status: DC
  Administered 2018-10-05 – 2018-11-01 (×27): 325 mg via ORAL
  Filled 2018-10-04 (×28): qty 1

## 2018-10-04 MED ORDER — MOMETASONE FURO-FORMOTEROL FUM 200-5 MCG/ACT IN AERO
2.0000 | INHALATION_SPRAY | Freq: Two times a day (BID) | RESPIRATORY_TRACT | Status: DC
  Administered 2018-10-05 – 2018-10-07 (×6): 2 via RESPIRATORY_TRACT
  Filled 2018-10-04: qty 8.8

## 2018-10-04 MED ORDER — DEXMEDETOMIDINE HCL IN NACL 400 MCG/100ML IV SOLN
INTRAVENOUS | Status: DC | PRN
  Administered 2018-10-04: 0.4 ug/kg/h via INTRAVENOUS

## 2018-10-04 MED ORDER — ACETAMINOPHEN 650 MG RE SUPP
650.0000 mg | Freq: Once | RECTAL | Status: AC
  Administered 2018-10-04: 650 mg via RECTAL

## 2018-10-04 MED ORDER — VANCOMYCIN HCL IN DEXTROSE 1-5 GM/200ML-% IV SOLN: 1000.0000 mg | Freq: Once | INTRAVENOUS | Status: DC

## 2018-10-04 MED ORDER — LORATADINE 10 MG PO TABS
10.0000 mg | ORAL_TABLET | Freq: Every day | ORAL | Status: DC
  Administered 2018-10-05 – 2018-11-01 (×27): 10 mg via ORAL
  Filled 2018-10-04 (×27): qty 1

## 2018-10-04 MED ORDER — SODIUM CHLORIDE 0.9 % IV SOLN
INTRAVENOUS | Status: DC | PRN
  Administered 2018-10-04: 20 ug via INTRAVENOUS

## 2018-10-04 MED ORDER — SODIUM CHLORIDE 0.9% FLUSH
3.0000 mL | Freq: Two times a day (BID) | INTRAVENOUS | Status: DC
  Administered 2018-10-05 – 2018-10-25 (×39): 3 mL via INTRAVENOUS
  Administered 2018-10-25: 10 mL via INTRAVENOUS
  Administered 2018-10-27 – 2018-10-31 (×4): 3 mL via INTRAVENOUS

## 2018-10-04 MED ORDER — INSULIN REGULAR BOLUS VIA INFUSION
0.0000 [IU] | Freq: Three times a day (TID) | INTRAVENOUS | Status: DC
  Filled 2018-10-04: qty 10

## 2018-10-04 MED ORDER — ALBUMIN HUMAN 5 % IV SOLN
INTRAVENOUS | Status: DC | PRN
  Administered 2018-10-04 (×2): via INTRAVENOUS

## 2018-10-04 MED ORDER — INSULIN REGULAR(HUMAN) IN NACL 100-0.9 UT/100ML-% IV SOLN
INTRAVENOUS | Status: DC
  Administered 2018-10-05: 5 [IU]/h via INTRAVENOUS
  Filled 2018-10-04: qty 100

## 2018-10-04 MED ORDER — SUCCINYLCHOLINE CHLORIDE 200 MG/10ML IV SOSY
PREFILLED_SYRINGE | INTRAVENOUS | Status: AC
  Filled 2018-10-04: qty 10

## 2018-10-04 MED ORDER — METOPROLOL TARTRATE 12.5 MG HALF TABLET
12.5000 mg | ORAL_TABLET | Freq: Two times a day (BID) | ORAL | Status: DC
  Administered 2018-10-05 – 2018-10-14 (×20): 12.5 mg via ORAL
  Filled 2018-10-04 (×20): qty 1

## 2018-10-04 MED ORDER — CHLORHEXIDINE GLUCONATE 0.12 % MT SOLN
15.0000 mL | OROMUCOSAL | Status: AC
  Administered 2018-10-04: 15 mL via OROMUCOSAL

## 2018-10-04 MED ORDER — SODIUM CHLORIDE 0.9% FLUSH: 10.0000 mL | INTRAVENOUS | Status: DC | PRN

## 2018-10-04 MED ORDER — SODIUM CHLORIDE 0.9 % IV SOLN: 250.0000 mL | INTRAVENOUS | Status: DC

## 2018-10-04 MED ORDER — PROTAMINE SULFATE 10 MG/ML IV SOLN
INTRAVENOUS | Status: DC | PRN
  Administered 2018-10-04 (×3): 40 mg via INTRAVENOUS
  Administered 2018-10-04: 30 mg via INTRAVENOUS
  Administered 2018-10-04 (×2): 40 mg via INTRAVENOUS
  Administered 2018-10-04: 20 mg via INTRAVENOUS
  Administered 2018-10-04: 10 mg via INTRAVENOUS
  Administered 2018-10-04: 40 mg via INTRAVENOUS

## 2018-10-04 MED ORDER — TRANEXAMIC ACID 1000 MG/10ML IV SOLN
INTRAVENOUS | Status: DC | PRN
  Administered 2018-10-04: 1.5 mg/kg/h via INTRAVENOUS

## 2018-10-04 MED ORDER — ALBUTEROL SULFATE (2.5 MG/3ML) 0.083% IN NEBU: 2.5000 mg | INHALATION_SOLUTION | Freq: Four times a day (QID) | RESPIRATORY_TRACT | Status: DC | PRN

## 2018-10-04 MED ORDER — POTASSIUM CHLORIDE 10 MEQ/50ML IV SOLN
10.0000 meq | Freq: Once | INTRAVENOUS | Status: AC
  Administered 2018-10-04: 10 meq via INTRAVENOUS
  Filled 2018-10-04: qty 50

## 2018-10-04 MED ORDER — ORAL CARE MOUTH RINSE
15.0000 mL | OROMUCOSAL | Status: DC
  Administered 2018-10-04 (×2): 15 mL via OROMUCOSAL

## 2018-10-04 MED ORDER — FENTANYL CITRATE (PF) 250 MCG/5ML IJ SOLN
INTRAMUSCULAR | Status: AC
  Filled 2018-10-04: qty 25

## 2018-10-04 MED ORDER — LACTATED RINGERS IV SOLN
INTRAVENOUS | Status: DC
  Administered 2018-10-04: 13:00:00 via INTRAVENOUS

## 2018-10-04 MED ORDER — ROCURONIUM BROMIDE 10 MG/ML (PF) SYRINGE
PREFILLED_SYRINGE | INTRAVENOUS | Status: AC
  Filled 2018-10-04: qty 10

## 2018-10-04 MED ORDER — PROTAMINE SULFATE 10 MG/ML IV SOLN
INTRAVENOUS | Status: AC
  Filled 2018-10-04: qty 25

## 2018-10-04 MED ORDER — PROTAMINE SULFATE 10 MG/ML IV SOLN
INTRAVENOUS | Status: AC
  Filled 2018-10-04: qty 5

## 2018-10-04 MED ORDER — ONDANSETRON HCL 4 MG/2ML IJ SOLN
4.0000 mg | Freq: Four times a day (QID) | INTRAMUSCULAR | Status: DC | PRN
  Administered 2018-10-26: 4 mg via INTRAVENOUS

## 2018-10-04 MED ORDER — LACTATED RINGERS IV SOLN: 500.0000 mL | Freq: Once | INTRAVENOUS | Status: DC | PRN

## 2018-10-04 MED ORDER — PANTOPRAZOLE SODIUM 40 MG PO TBEC
40.0000 mg | DELAYED_RELEASE_TABLET | Freq: Every day | ORAL | Status: DC
  Administered 2018-10-06 – 2018-11-01 (×26): 40 mg via ORAL
  Filled 2018-10-04 (×26): qty 1

## 2018-10-04 MED ORDER — DIPHENHYDRAMINE HCL 50 MG/ML IJ SOLN
INTRAMUSCULAR | Status: DC | PRN
  Administered 2018-10-04: 12.5 mg via INTRAVENOUS

## 2018-10-04 SURGICAL SUPPLY — 143 items
ADAPTER CARDIO PERF ANTE/RETRO (ADAPTER) ×2 IMPLANT
ADH SKN CLS APL DERMABOND .7 (GAUZE/BANDAGES/DRESSINGS) ×2
ADPR PRFSN 84XANTGRD RTRGD (ADAPTER) ×2
AGENT HMST KT MTR STRL THRMB (HEMOSTASIS) ×2
APPLIER CLIP 9.375 MED OPEN (MISCELLANEOUS)
APPLIER CLIP 9.375 SM OPEN (CLIP)
APR CLP MED 9.3 20 MLT OPN (MISCELLANEOUS)
APR CLP SM 9.3 20 MLT OPN (CLIP)
BAG DECANTER FOR FLEXI CONT (MISCELLANEOUS) ×4 IMPLANT
BANDAGE ACE 4X5 VEL STRL LF (GAUZE/BANDAGES/DRESSINGS) ×2 IMPLANT
BANDAGE ACE 6X5 VEL STRL LF (GAUZE/BANDAGES/DRESSINGS) ×2 IMPLANT
BANDAGE ELASTIC 4 VELCRO ST LF (GAUZE/BANDAGES/DRESSINGS) ×2 IMPLANT
BANDAGE ELASTIC 6 VELCRO ST LF (GAUZE/BANDAGES/DRESSINGS) ×2 IMPLANT
BLADE CLIPPER SURG (BLADE) ×4 IMPLANT
BLADE CORE FAN STRYKER (BLADE) ×6 IMPLANT
BLADE SAW SAG 29X58X.64 (BLADE) ×2 IMPLANT
BLADE STERNUM SYSTEM 6 (BLADE) ×2 IMPLANT
BLADE SURG 11 STRL SS (BLADE) ×2 IMPLANT
BLADE SURG 12 STRL SS (BLADE) ×4 IMPLANT
BNDG GAUZE ELAST 4 BULKY (GAUZE/BANDAGES/DRESSINGS) ×4 IMPLANT
CANISTER SUCT 3000ML PPV (MISCELLANEOUS) ×4 IMPLANT
CANNULA GUNDRY RCSP 15FR (MISCELLANEOUS) ×2 IMPLANT
CATH RETROPLEGIA CORONARY 14FR (CATHETERS) IMPLANT
CATH ROBINSON RED A/P 18FR (CATHETERS) ×6 IMPLANT
CATH THORACIC 28FR (CATHETERS) ×2 IMPLANT
CATH THORACIC 28FR RT ANG (CATHETERS) ×2 IMPLANT
CATH THORACIC 36FR (CATHETERS) ×2 IMPLANT
CATH THORACIC 36FR RT ANG (CATHETERS) ×4 IMPLANT
CLIP APPLIE 9.375 MED OPEN (MISCELLANEOUS) IMPLANT
CLIP APPLIE 9.375 SM OPEN (CLIP) IMPLANT
CLIP FOGARTY SPRING 6M (CLIP) IMPLANT
CLIP RETRACTION 3.0MM CORONARY (MISCELLANEOUS) ×2 IMPLANT
CLIP VESOCCLUDE MED 24/CT (CLIP) IMPLANT
CLIP VESOCCLUDE SM WIDE 24/CT (CLIP) IMPLANT
CONN Y 3/8X3/8X3/8  BEN (MISCELLANEOUS)
CONN Y 3/8X3/8X3/8 BEN (MISCELLANEOUS) IMPLANT
COVER WAND RF STERILE (DRAPES) ×2 IMPLANT
CRADLE DONUT ADULT HEAD (MISCELLANEOUS) ×4 IMPLANT
DERMABOND ADVANCED (GAUZE/BANDAGES/DRESSINGS) ×2
DERMABOND ADVANCED .7 DNX12 (GAUZE/BANDAGES/DRESSINGS) IMPLANT
DRAPE CARDIOVASCULAR INCISE (DRAPES) ×4
DRAPE SLUSH/WARMER DISC (DRAPES) ×2 IMPLANT
DRAPE SRG 135X102X78XABS (DRAPES) ×2 IMPLANT
DRSG AQUACEL AG ADV 3.5X14 (GAUZE/BANDAGES/DRESSINGS) ×4 IMPLANT
ELECT BLADE 6.5 EXT (BLADE) ×4 IMPLANT
ELECT CAUTERY BLADE 6.4 (BLADE) ×2 IMPLANT
ELECT REM PT RETURN 9FT ADLT (ELECTROSURGICAL) ×8
ELECTRODE REM PT RTRN 9FT ADLT (ELECTROSURGICAL) ×4 IMPLANT
FELT TEFLON 1X6 (MISCELLANEOUS) ×4 IMPLANT
GAUZE SPONGE 4X4 12PLY STRL (GAUZE/BANDAGES/DRESSINGS) ×8 IMPLANT
GAUZE XEROFORM 1X8 LF (GAUZE/BANDAGES/DRESSINGS) ×2 IMPLANT
GLOVE BIO SURGEON STRL SZ 6 (GLOVE) ×6 IMPLANT
GLOVE BIO SURGEON STRL SZ 6.5 (GLOVE) ×2 IMPLANT
GLOVE BIO SURGEON STRL SZ7 (GLOVE) ×4 IMPLANT
GLOVE BIO SURGEON STRL SZ7.5 (GLOVE) ×10 IMPLANT
GLOVE BIO SURGEONS STRL SZ 6.5 (GLOVE)
GLOVE BIOGEL PI IND STRL 6 (GLOVE) ×2 IMPLANT
GLOVE BIOGEL PI IND STRL 6.5 (GLOVE) ×2 IMPLANT
GLOVE BIOGEL PI IND STRL 7.0 (GLOVE) ×2 IMPLANT
GLOVE BIOGEL PI INDICATOR 6 (GLOVE) ×4
GLOVE BIOGEL PI INDICATOR 6.5 (GLOVE) ×2
GLOVE BIOGEL PI INDICATOR 7.0 (GLOVE) ×2
GLOVE EUDERMIC 7 POWDERFREE (GLOVE) ×4 IMPLANT
GLOVE ORTHO TXT STRL SZ7.5 (GLOVE) ×2 IMPLANT
GOWN STRL REUS W/ TWL LRG LVL3 (GOWN DISPOSABLE) ×8 IMPLANT
GOWN STRL REUS W/TWL LRG LVL3 (GOWN DISPOSABLE) ×40
HEMOSTAT POWDER SURGIFOAM 1G (HEMOSTASIS) ×6 IMPLANT
HEMOSTAT SURGICEL 2X14 (HEMOSTASIS) ×2 IMPLANT
INSERT FOGARTY 61MM (MISCELLANEOUS) ×2 IMPLANT
INSERT FOGARTY XLG (MISCELLANEOUS) IMPLANT
KIT BASIN OR (CUSTOM PROCEDURE TRAY) ×4 IMPLANT
KIT SUCTION CATH 14FR (SUCTIONS) ×2 IMPLANT
KIT TURNOVER KIT B (KITS) ×4 IMPLANT
KIT VASOVIEW HEMOPRO 2 VH 4000 (KITS) ×4 IMPLANT
MARKER GRAFT CORONARY BYPASS (MISCELLANEOUS) ×2 IMPLANT
NS IRRIG 1000ML POUR BTL (IV SOLUTION) ×18 IMPLANT
PACK E OPEN HEART (SUTURE) ×4 IMPLANT
PACK OPEN HEART (CUSTOM PROCEDURE TRAY) ×4 IMPLANT
PAD ARMBOARD 7.5X6 YLW CONV (MISCELLANEOUS) ×8 IMPLANT
PAD DEFIB R2 (MISCELLANEOUS) IMPLANT
PAD ELECT DEFIB RADIOL ZOLL (MISCELLANEOUS) ×4 IMPLANT
PENCIL BUTTON HOLSTER BLD 10FT (ELECTRODE) ×2 IMPLANT
PUNCH AORTIC ROTATE  4.5MM 8IN (MISCELLANEOUS) ×2 IMPLANT
PUNCH AORTIC ROTATE 4.0MM (MISCELLANEOUS) IMPLANT
PUNCH AORTIC ROTATE 4.5MM 8IN (MISCELLANEOUS) IMPLANT
PUNCH AORTIC ROTATE 5MM 8IN (MISCELLANEOUS) IMPLANT
SEALANT SURG COSEAL 8ML (VASCULAR PRODUCTS) ×2 IMPLANT
SENSOR MYOCARDIAL TEMP (MISCELLANEOUS) ×2 IMPLANT
SET CARDIOPLEGIA MPS 5001102 (MISCELLANEOUS) ×2 IMPLANT
SOLUTION ANTI FOG 6CC (MISCELLANEOUS) ×4 IMPLANT
SPONGE INTESTINAL PEANUT (DISPOSABLE) ×2 IMPLANT
SPONGE LAP 18X18 RF (DISPOSABLE) ×6 IMPLANT
SPONGE LAP 4X18 RFD (DISPOSABLE) ×2 IMPLANT
STOPCOCK 4 WAY LG BORE MALE ST (IV SETS) ×2 IMPLANT
SURGIFLO W/THROMBIN 8M KIT (HEMOSTASIS) ×2 IMPLANT
SUT BONE WAX W31G (SUTURE) ×4 IMPLANT
SUT ETHIBOND 2 0 SH (SUTURE)
SUT ETHIBOND 2 0 SH 36X2 (SUTURE) ×2 IMPLANT
SUT MNCRL AB 4-0 PS2 18 (SUTURE) ×4 IMPLANT
SUT PROLENE 3 0 SH 1 (SUTURE) IMPLANT
SUT PROLENE 3 0 SH DA (SUTURE) ×8 IMPLANT
SUT PROLENE 3 0 SH1 36 (SUTURE) ×2 IMPLANT
SUT PROLENE 4 0 RB 1 (SUTURE) ×8
SUT PROLENE 4 0 SH DA (SUTURE) ×6 IMPLANT
SUT PROLENE 4-0 RB1 .5 CRCL 36 (SUTURE) IMPLANT
SUT PROLENE 5 0 C 1 36 (SUTURE) ×2 IMPLANT
SUT PROLENE 6 0 C 1 30 (SUTURE) ×6 IMPLANT
SUT PROLENE 6 0 CC (SUTURE) ×12 IMPLANT
SUT PROLENE 7 0 BV 1 (SUTURE) ×2 IMPLANT
SUT PROLENE 7 0 BV1 MDA (SUTURE) ×2 IMPLANT
SUT PROLENE 7 0 DA (SUTURE) IMPLANT
SUT PROLENE 8 0 BV175 6 (SUTURE) IMPLANT
SUT PROLENE BLUE 7 0 (SUTURE) ×4 IMPLANT
SUT PROLENE POLY MONO (SUTURE) ×2 IMPLANT
SUT SILK  1 MH (SUTURE)
SUT SILK 1 MH (SUTURE) ×2 IMPLANT
SUT SILK 2 0 SH CR/8 (SUTURE) ×4 IMPLANT
SUT SILK 3 0 SH CR/8 (SUTURE) ×2 IMPLANT
SUT STEEL 6MS V (SUTURE) IMPLANT
SUT STEEL STERNAL CCS#1 18IN (SUTURE) ×2 IMPLANT
SUT STEEL SZ 6 DBL 3X14 BALL (SUTURE) ×4 IMPLANT
SUT VIC AB 1 CTX 36 (SUTURE) ×8
SUT VIC AB 1 CTX36XBRD ANBCTR (SUTURE) IMPLANT
SUT VIC AB 2-0 CT1 27 (SUTURE) ×8
SUT VIC AB 2-0 CT1 TAPERPNT 27 (SUTURE) IMPLANT
SUT VIC AB 2-0 CTX 27 (SUTURE) IMPLANT
SUT VIC AB 3-0 SH 27 (SUTURE)
SUT VIC AB 3-0 SH 27X BRD (SUTURE) ×2 IMPLANT
SUT VIC AB 3-0 X1 27 (SUTURE) IMPLANT
SYSTEM SAHARA CHEST DRAIN ATS (WOUND CARE) ×4 IMPLANT
TAPE CLOTH SOFT 2X10 (GAUZE/BANDAGES/DRESSINGS) ×2 IMPLANT
TAPE CLOTH SURG 4X10 WHT LF (GAUZE/BANDAGES/DRESSINGS) ×2 IMPLANT
TOWEL GREEN STERILE (TOWEL DISPOSABLE) ×4 IMPLANT
TOWEL GREEN STERILE FF (TOWEL DISPOSABLE) ×2 IMPLANT
TRAY CATH LUMEN 1 20CM STRL (SET/KITS/TRAYS/PACK) ×2 IMPLANT
TRAY FOLEY SLVR 16FR TEMP STAT (SET/KITS/TRAYS/PACK) ×4 IMPLANT
TUBE CONNECTING 12'X1/4 (SUCTIONS) ×1
TUBE CONNECTING 12X1/4 (SUCTIONS) ×1 IMPLANT
TUBING ART PRESS 48 MALE/FEM (TUBING) ×2 IMPLANT
TUBING LAP HI FLOW INSUFFLATIO (TUBING) ×4 IMPLANT
UNDERPAD 30X30 (UNDERPADS AND DIAPERS) ×4 IMPLANT
WATER STERILE IRR 1000ML POUR (IV SOLUTION) ×8 IMPLANT
YANKAUER SUCT BULB TIP NO VENT (SUCTIONS) ×4 IMPLANT

## 2018-10-04 NOTE — Transfer of Care (Signed)
Immediate Anesthesia Transfer of Care Note  Patient: Melvin Brewer  Procedure(s) Performed: REDO CORONARY ARTERY BYPASS GRAFTING (CABG) x 1, SVG TO LAD, USING LEFT GREATER SAPHENOUS VEIN HARVESTED ENDOSCOPICALLY (N/A Chest) TRANSESOPHAGEAL ECHOCARDIOGRAM (TEE) (N/A )  Patient Location: SICU  Anesthesia Type:General  Level of Consciousness: Patient remains intubated per anesthesia plan  Airway & Oxygen Therapy: Patient remains intubated per anesthesia plan and Patient placed on Ventilator (see vital sign flow sheet for setting)  Post-op Assessment: Report given to RN and Post -op Vital signs reviewed and stable  Post vital signs: Reviewed and stable  Last Vitals:  Vitals Value Taken Time  BP    Temp    Pulse    Resp 12 10/04/18 1325  SpO2    Vitals shown include unvalidated device data.  Last Pain:  Vitals:   10/04/18 0622  TempSrc: Oral  PainSc:       Patients Stated Pain Goal: 3 (01/29/10 7356)  Complications: No apparent anesthesia complications

## 2018-10-04 NOTE — Anesthesia Procedure Notes (Signed)
Procedure Name: Intubation Date/Time: 10/04/2018 7:30 AM Performed by: Kathryne Hitch, CRNA Pre-anesthesia Checklist: Patient identified, Emergency Drugs available, Suction available and Patient being monitored Patient Re-evaluated:Patient Re-evaluated prior to induction Oxygen Delivery Method: Circle system utilized Preoxygenation: Pre-oxygenation with 100% oxygen Induction Type: IV induction Laryngoscope Size: Mac and 4 (Miller 2 x1 CRNA. Grade 3 view ) Tube type: Oral Tube size: 8.0 mm Number of attempts: 2 Airway Equipment and Method: Stylet and Oral airway Placement Confirmation: ETT inserted through vocal cords under direct vision,  positive ETCO2 and breath sounds checked- equal and bilateral Secured at: 23 cm Tube secured with: Tape Dental Injury: Teeth and Oropharynx as per pre-operative assessment

## 2018-10-04 NOTE — Anesthesia Postprocedure Evaluation (Signed)
Anesthesia Post Note  Patient: Melvin Brewer  Procedure(s) Performed: REDO CORONARY ARTERY BYPASS GRAFTING (CABG) x 1, SVG TO LAD, USING LEFT GREATER SAPHENOUS VEIN HARVESTED ENDOSCOPICALLY (N/A Chest) TRANSESOPHAGEAL ECHOCARDIOGRAM (TEE) (N/A )     Patient location during evaluation: SICU Anesthesia Type: General Level of consciousness: sedated Pain management: pain level controlled Vital Signs Assessment: post-procedure vital signs reviewed and stable Respiratory status: patient remains intubated per anesthesia plan Cardiovascular status: stable Postop Assessment: no apparent nausea or vomiting Anesthetic complications: no    Last Vitals:  Vitals:   10/04/18 1630 10/04/18 1645  BP: (!) 83/61 (!) 84/58  Pulse: 80 79  Resp: 12 12  Temp: (!) 36.4 C 36.5 C  SpO2: 98% 98%    Last Pain:  Vitals:   10/04/18 1600  TempSrc: Core  PainSc:                  Melvin Brewer S

## 2018-10-04 NOTE — Brief Op Note (Signed)
10/04/2018  10:14 AM  PATIENT:  Melvin Brewer  68 y.o. male  PRE-OPERATIVE DIAGNOSIS:  CAD COPD  POST-OPERATIVE DIAGNOSIS:  CAD COPD  PROCEDURE:  Procedure(s):  REDO MEDIAN STERNOTOMY  REDO CORONARY ARTERY BYPASS GRAFTING (CABG) x 1,  SVG TO LAD, '  ENDOSCOPIC HARVEST GREATER SAPHENOUS VEIN -Left Thigh  TRANSESOPHAGEAL ECHOCARDIOGRAM (TEE) (N/A)  SURGEON:  Surgeon(s) and Role:    Ivin Poot, MD - Primary    * Wonda Olds, MD - Assisting  PHYSICIAN ASSISTANT: Ellwood Handler PA-C  ANESTHESIA:   general  EBL:  1000 mL   BLOOD ADMINISTERED: CELLSAVER  DRAINS: Mediastinal Chest Drains   LOCAL MEDICATIONS USED:  NONE  SPECIMEN:  No Specimen  DISPOSITION OF SPECIMEN:  N/A  COUNTS:  YES  TOURNIQUET:  * No tourniquets in log *  DICTATION: .Dragon Dictation  PLAN OF CARE: Admit to inpatient   PATIENT DISPOSITION:  ICU - intubated and hemodynamically stable.   Delay start of Pharmacological VTE agent (>24hrs) due to surgical blood loss or risk of bleeding: yes

## 2018-10-04 NOTE — Progress Notes (Signed)
      [address]       [place] [address]             [phone]      S/p Redo CABG x 1  BP (!) 84/58   Pulse 79   Temp 97.7 F (36.5 C)   Resp 12   Ht 5\' 9"  (1.753 m)   Wt 99.9 kg   SpO2 98%   BMI 32.52 kg/m   CO= 5.9 on dopamine at 2 and milrinone 0.25  Intake/Output Summary (Last 24 hours) at 10/04/2018 1706 Last data filed at 10/04/2018 1600 Gross per 24 hour  Intake 4190.39 ml  Output 2269 ml  Net 1921.39 ml   Minimal CT output  Doing well early postop  Remo Lipps C. Roxan Hockey, MD [facility] [phone]

## 2018-10-04 NOTE — Anesthesia Preprocedure Evaluation (Addendum)
Anesthesia Evaluation  Patient identified by MRN, date of birth, ID band Patient awake    Reviewed: Allergy & Precautions, NPO status , Patient's Chart, lab work & pertinent test results  Airway Mallampati: II  TM Distance: >3 FB Neck ROM: Full    Dental no notable dental hx.    Pulmonary COPD, former smoker,    Pulmonary exam normal breath sounds clear to auscultation + decreased breath sounds      Cardiovascular hypertension, + CAD, + CABG and + Peripheral Vascular Disease  Normal cardiovascular exam Rhythm:Regular Rate:Normal  EF 50%   Ost 1st Mrg lesion is 100% stenosed.  1st LPL lesion is 100% stenosed.  Dist LM to Ost LAD lesion is 100% stenosed.  Origin to Prox Graft lesion is 100% stenosed.  Dist Graft lesion is 50% stenosed.   Conclusion Right heart cath Mean wedge of 20 PA 45/21 mean of 30 Cardiac output of 7.2 Fick  Multivessel coronary disease including coronary bypass Preserved left ventricular function borderline ejection fraction around 50% Native vessels with flush occlusion of the LAD CTO but grafted by LIMA Circumflex large left dominant minor irregularities occlusion of OM1 and OM 2 but grafted RCA nondominant Grafts LIMA to LAD atretic and occluded SVG jump graft from OM1 to OM 2  ,50% mid lesion but patent Significant collaterals right to left Significant collaterals left to left Almost complete revascularization of the LAD through collaterals    Neuro/Psych negative neurological ROS  negative psych ROS   GI/Hepatic negative GI ROS, Neg liver ROS,   Endo/Other  diabetes  Renal/GU negative Renal ROS  negative genitourinary   Musculoskeletal negative musculoskeletal ROS (+)   Abdominal   Peds negative pediatric ROS (+)  Hematology negative hematology ROS (+)   Anesthesia Other Findings   Reproductive/Obstetrics negative OB ROS                             Anesthesia Physical Anesthesia Plan  ASA: IV  Anesthesia Plan: General   Post-op Pain Management:    Induction: Intravenous  PONV Risk Score and Plan: 0  Airway Management Planned: Oral ETT  Additional Equipment: Ultrasound Guidance Line Placement, TEE, PA Cath and Arterial line  Intra-op Plan:   Post-operative Plan: Post-operative intubation/ventilation  Informed Consent: I have reviewed the patients History and Physical, chart, labs and discussed the procedure including the risks, benefits and alternatives for the proposed anesthesia with the patient or authorized representative who has indicated his/her understanding and acceptance.     Dental advisory given  Plan Discussed with: CRNA and Surgeon  Anesthesia Plan Comments:         Anesthesia Quick Evaluation

## 2018-10-04 NOTE — Anesthesia Procedure Notes (Signed)
Central Venous Catheter Insertion Performed by: Lillia Abed, MD, anesthesiologist Start/End6/25/2020 6:40 AM Patient location: Pre-op. Preanesthetic checklist: patient identified, IV checked, risks and benefits discussed, surgical consent, monitors and equipment checked, pre-op evaluation, timeout performed and anesthesia consent Position: Trendelenburg Lidocaine 1% used for infiltration and patient sedated Hand hygiene performed  and maximum sterile barriers used  Catheter size: 8.5 Fr PA cath was placed.MAC introducer Procedure performed using ultrasound guided technique. Ultrasound Notes:anatomy identified, needle tip was noted to be adjacent to the nerve/plexus identified, no ultrasound evidence of intravascular and/or intraneural injection and image(s) printed for medical record Attempts: 1 Following insertion, line sutured and dressing applied. Post procedure assessment: blood return through all ports, free fluid flow and no air  Patient tolerated the procedure well with no immediate complications.

## 2018-10-04 NOTE — Anesthesia Procedure Notes (Signed)
Arterial Line Insertion Start/End6/25/2020 6:50 AM Performed by: Kathryne Hitch, CRNA, CRNA  Patient location: Pre-op. Preanesthetic checklist: patient identified, IV checked, site marked, risks and benefits discussed, surgical consent, monitors and equipment checked and pre-op evaluation Lidocaine 1% used for infiltration Right, radial was placed Catheter size: 20 Fr Hand hygiene performed  and maximum sterile barriers used   Attempts: 1 Procedure performed without using ultrasound guided technique. Following insertion, dressing applied and Biopatch. Post procedure assessment: normal and unchanged  Patient tolerated the procedure well with no immediate complications.

## 2018-10-04 NOTE — Progress Notes (Signed)
Pre Procedure note for inpatients:   Melvin Brewer has been scheduled for Procedure(s): REDO CORONARY ARTERY BYPASS GRAFTING (CABG) (N/A) TRANSESOPHAGEAL ECHOCARDIOGRAM (TEE) (N/A) today. The various methods of treatment have been discussed with the patient. After consideration of the risks, benefits and treatment options the patient has consented to the planned procedure.   The patient has been seen and labs reviewed. There are no changes in the patient's condition to prevent proceeding with the planned procedure today.  Recent labs:  Lab Results  Component Value Date   WBC 8.7 10/01/2018   HGB 15.0 10/01/2018   HCT 46.0 10/01/2018   PLT 126 (L) 10/01/2018   GLUCOSE 86 10/01/2018   ALT 50 (H) 10/01/2018   AST 24 10/01/2018   NA 140 10/01/2018   K 4.6 10/01/2018   CL 109 10/01/2018   CREATININE 1.03 10/01/2018   BUN 16 10/01/2018   CO2 20 (L) 10/01/2018   INR 1.1 10/01/2018   HGBA1C 6.2 (H) 10/01/2018    Len Childs, MD 10/04/2018 7:39 AM

## 2018-10-04 NOTE — H&P (Signed)
PCP is Hortencia Pilar, MD Referring Provider is No ref. provider found  No chief complaint on file.   HPI: Patient presents for further follow-up of his increasing dyspnea on exertion.  This is been going on for several weeks and was felt to be out of proportion to his degree of COPD.  He underwent CABG x3 in 2007 at [facility] for unstable angina.  He was evaluated by Dr. Clayborn Bigness who felt that cardiac catheterization was indicated to assess recurrent coronary disease.  Results of catheterization demonstrated patent sequential vein graft to the OM1 and OM 2.  The RCA was nondominant and small.  The left IMA graft to the LAD was atretic because of a high-grade proximal left subclavian stenosis.  The distal LAD appeared to be a graftable vessel.  For that reason the patient was referred for evaluation of redo CABG.  LVEF by ventriculogram and echocardiogram is normal.  There is no associated valvular disease noted on the studies.  The patient does have significant COPD and sees a pulmonologist in [place].  He recently completed a course of oral prednisone.  He is on long-term Z-Pak Monday Wednesday Friday.  He is not on home oxygen.  PFTs indicate FEV1 of 960 cc, which improves with an bronchodilator, diffusion capacity 56% predicted.  CT scan of the chest images show moderate COPD, no at risk lesions or large blebs.  Since initial visit the patient has undergone additional studies including pre-CABG Dopplers.  He has no significant carotid disease.  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  Vein mapping shows the left leg saphenous vein should be an adequate conduit.  Patient also has a documented patent right IMA demonstrated a catheterization however the right IMA would probably not be long enough to graft his distal LAD beyond the previous left IMA graft.  The patient's pulmonary status currently is fairly  stable.  He was able to walk up and down the hallway 100 feet with oxygen saturation at rest 96% increasing to 98% post ambulation.  The patient will be scheduled for redo sternotomy and CABG x1 on June 25 at [facility].  I discussed the procedure in detail in the presence of both the patient and his wife and we reviewed the benefits and risks.  Patient will stop his metformin for his diabetes 48 hours prior to surgery. Past Medical History:  Diagnosis Date  . AAA (abdominal aortic aneurysm) ([place])   . Abdominal aortic aneurysm (AAA) ([place])   . Allergic rhinitis   . Anginal pain ([place])   . Centrilobular emphysema ([place])   . Cervical spine degeneration   . COPD (chronic obstructive pulmonary disease) ([place])   . Coronary artery disease   . Diabetes mellitus without complication ([place])   . Diverticulitis   . DJD (degenerative joint disease)   . Eczema   . GERD (gastroesophageal reflux disease)   . H/O hiatal hernia    AGE 68 S  NO MEDS  . Hemorrhoids   . Herpes simplex infection   . Hypercholesterolemia   . Hyperglycemia   . Hyperlipidemia   . Hypertension   . Obesity   . Shortness of breath    W/ EXERTION     Past Surgical History:  Procedure Laterality Date  . ANTERIOR CERVICAL DECOMP/DISCECTOMY FUSION N/A 06/20/2013   Procedure: CERVCIAL FIVE-SIX,CERVICAL SIX-SEVEN ANTERIOR CERVICAL DECOMPRESSION WITH FUSION INTERBODY PROSTHESIS PLATING AND PEEK CAGE;  Surgeon: Dellis Filbert  Flo Shanks, MD;  Location: [facility] NEURO ORS;  Service: Neurosurgery;  Laterality: N/A;  anterior  . CARDIAC CATHETERIZATION     [place]  2007   . COLONOSCOPY WITH PROPOFOL N/A 12/26/2017   Procedure: COLONOSCOPY WITH PROPOFOL;  Surgeon: Lollie Sails, MD;  Location: [facility] ENDOSCOPY;  Service: Endoscopy;  Laterality: N/A;  . CORONARY ARTERY BYPASS GRAFT     2007  '@[facility]'$   . EYE SURGERY     RT EYE LASER (RET DETACHMENT)  . RIGHT/LEFT HEART CATH AND CORONARY/GRAFT ANGIOGRAPHY Left 09/05/2018   Procedure: RIGHT/LEFT  HEART CATH AND CORONARY/GRAFT ANGIOGRAPHY;  Surgeon: Yolonda Kida, MD;  Location: [facility] CV LAB;  Service: Cardiovascular;  Laterality: Left;  . TONSILLECTOMY      History reviewed. No pertinent family history.  Social History Social History   Tobacco Use  . Smoking status: Former Smoker    Packs/day: 2.00    Years: 43.00    Pack years: 86.00    Types: Cigarettes    Quit date: 04/11/2004    Years since quitting: 14.4  . Smokeless tobacco: Never Used  Substance Use Topics  . Alcohol use: Yes    Alcohol/week: 3.0 - 4.0 standard drinks    Types: 3 - 4 Standard drinks or equivalent per week  . Drug use: No    Current Facility-Administered Medications  Medication Dose Route Frequency Provider Last Rate Last Dose  . cefUROXime (ZINACEF) 1.5 g in sodium chloride 0.9 % 100 mL IVPB  1.5 g Intravenous To OR Prescott Gum, Collier Salina, MD      . cefUROXime (ZINACEF) 750 mg in sodium chloride 0.9 % 100 mL IVPB  750 mg Intravenous To OR Prescott Gum, Collier Salina, MD      . chlorhexidine (HIBICLENS) 4 % liquid 2 application  30 mL Topical UD Prescott Gum, Collier Salina, MD      . dexmedetomidine (PRECEDEX) 400 MCG/100ML (4 mcg/mL) infusion  0.1-0.7 mcg/kg/hr Intravenous To OR Prescott Gum, Collier Salina, MD      . DOPamine (INTROPIN) 800 mg in dextrose 5 % 250 mL (3.2 mg/mL) infusion  0-10 mcg/kg/min Intravenous To OR Prescott Gum, Collier Salina, MD      . EPINEPHrine (ADRENALIN) 4 mg in dextrose 5 % 250 mL (0.016 mg/mL) infusion  0-10 mcg/min Intravenous To OR Prescott Gum, Collier Salina, MD      . heparin 2,500 Units, papaverine 30 mg in electrolyte-148 (PLASMALYTE-148) 500 mL irrigation   Irrigation To OR Prescott Gum, Collier Salina, MD      . heparin 30,000 units/NS 1000 mL solution for CELLSAVER   Other To OR Prescott Gum, Collier Salina, MD      . insulin regular, human (MYXREDLIN) 100 units/ 100 mL infusion   Intravenous To OR Prescott Gum, Collier Salina, MD      . magnesium sulfate (IV Push/IM) injection 40 mEq  40 mEq Other To OR Prescott Gum, Collier Salina, MD      . metoprolol  tartrate (LOPRESSOR) tablet 12.5 mg  12.5 mg Oral Once Prescott Gum, Collier Salina, MD      . milrinone (PRIMACOR) 20 MG/100 ML (0.2 mg/mL) infusion  0.3 mcg/kg/min Intravenous To OR Prescott Gum, Collier Salina, MD      . nitroGLYCERIN 50 mg in dextrose 5 % 250 mL (0.2 mg/mL) infusion  2-200 mcg/min Intravenous To OR Prescott Gum, Collier Salina, MD      . norepinephrine (LEVOPHED) '4mg'$  in 243m premix infusion  0-40 mcg/min Intravenous To OR VPrescott Gum PCollier Salina MD      . phenylephrine (NEOSYNEPHRINE) 20-0.9  MG/250ML-% infusion  30-200 mcg/min Intravenous To OR Prescott Gum, Collier Salina, MD      . potassium chloride injection 80 mEq  80 mEq Other To OR Prescott Gum, Collier Salina, MD      . tranexamic acid (CYKLOKAPRON) 2,500 mg in sodium chloride 0.9 % 250 mL (10 mg/mL) infusion  1.5 mg/kg/hr Intravenous To OR Prescott Gum, Collier Salina, MD      . tranexamic acid (CYKLOKAPRON) bolus via infusion - over 30 minutes 1,498.5 mg  15 mg/kg Intravenous To OR Prescott Gum, Collier Salina, MD      . tranexamic acid (CYKLOKAPRON) pump prime solution 200 mg  2 mg/kg Intracatheter To OR Prescott Gum, Collier Salina, MD      . vancomycin (VANCOCIN) 1,500 mg in sodium chloride 0.9 % 250 mL IVPB  1,500 mg Intravenous To OR Ivin Poot, MD        Allergies  Allergen Reactions  . Atorvastatin Other (See Comments)    Leg cramps  . Sulfa Antibiotics Rash    Sun Rash     Review of Systems                    Review of Systems :  [ y ] = yes, [  ] = no        General :  Weight gain [  y ]    Weight loss  [   ]  Fatigue [  ]  Fever [  ]  Chills  [  ]                                          HEENT    Headache [  ]  Dizziness [  ]  Blurred vision [  ] Glaucoma  [  ]                          Nosebleeds [  ] Painful or loose teeth [  ]        Cardiac :  Chest pain/ pressure [  ]  Resting SOB [  ] exertional SOB [ y ]                        Orthopnea [  ]  Pedal edema  [ y ]  Palpitations [  ] Syncope/presyncope [ ]                         Paroxysmal nocturnal dyspnea [  ]         Pulmonary  : cough Blue.Reese  ]  wheezing [ y ]  Hemoptysis [  ] Sputum [  ] Snoring [  ]                              Pneumothorax [  ]  Sleep apnea [  ]        GI : Vomiting [  ]  Dysphagia [  ]  Melena  [  ]  Abdominal pain [  ] BRBPR [  ]              Heart burn [  ]  Constipation [  ] Diarrhea  [  ] Colonoscopy [   ]  GU : Hematuria [  ]  Dysuria [  ]  Nocturia [  ] UTI's [  ]        Vascular : Claudication [  ]  Rest pain [  ]  DVT [  ] Vein stripping [  ] leg ulcers [  ]                          TIA [  ] Stroke [  ]  Varicose veins [  ]        NEURO :  Headaches  [  ] Seizures [  ] Vision changes [  ] Paresthesias [  ]                                               Musculoskeletal :  Arthritis Blue.Reese  ] Gout  [  ]  Back pain [ y ]  Joint pain [  ]        Skin :  Rash [  ]  Melanoma [  ] Sores [  ]        Heme : Bleeding problems [  ]Clotting Disorders [  ] Anemia [  ]Blood Transfusion [ ]         Endocrine : Diabetes Blue.Reese  ] Heat or Cold intolerance [  ] Polyuria [  ]excessive thirst [ ]         Psych : Depression [  ]  Anxiety [  ]  Psych hospitalizations [  ] Memory change [  ]                                                                            BP (!) 136/58   Pulse (!) 56   Temp 98.3 F (36.8 C) (Oral)   Resp 13   Ht 5' 9"  (1.753 m)   Wt 99.9 kg   SpO2 97%   BMI 32.52 kg/m  Physical Exam      Exam    General- alert and comfortable    Neck- no JVD, no cervical adenopathy palpable, no carotid bruit   Lungs- clear without rales, scattered wheezes present    Cor- regular rate and rhythm, no murmur , gallop   Abdomen- soft, non-tender   Extremities - warm, non-tender, minimal edema   Neuro- oriented, appropriate, no focal weakness   Diagnostic Tests: CT scan images, coronary angiogram, echocardiogram all personally reviewed and discussed with patient  Impression: Exertional angina with recurrent CAD COPD moderate-severe Preserved LV systolic  function Diabetes Hypertension  Plan: Patient will be scheduled for redo sternotomy and redo CABG x1 at [facility] in June 25.  No change in medical condition Preop labs all reviewed  Len Childs, MD [facility] [phone]

## 2018-10-04 NOTE — Procedures (Signed)
Extubation Procedure Note  Patient Details:   Name: Melvin Brewer DOB: 03-Nov-1950 MRN: [id number]   Airway Documentation:  Airway (Active)   Vent end date: 10/04/18 Vent end time: 1850   Evaluation  O2 sats: stable throughout Complications: No apparent complications Patient did tolerate procedure well. Bilateral Breath Sounds: Clear   Yes   Pt was extubated at 1850. There was a cuff leak prior to removal and patient was able to say his name and speak clearly after removal. NIF -40 and VC 7.5L. Pt placed on 4L [place]. Will continue to monitor.   Stetson Pelaez A Arian Mcquitty 10/04/2018, 6:54 PM

## 2018-10-05 ENCOUNTER — Encounter (HOSPITAL_COMMUNITY): Payer: Self-pay | Admitting: Cardiothoracic Surgery

## 2018-10-05 ENCOUNTER — Inpatient Hospital Stay (HOSPITAL_COMMUNITY): Payer: Medicare Other

## 2018-10-05 LAB — BPAM FFP
Blood Product Expiration Date: 202006292359
Blood Product Expiration Date: 202006292359
ISSUE DATE / TIME: 202006251208
ISSUE DATE / TIME: 202006251208
Unit Type and Rh: 6200
Unit Type and Rh: 6200

## 2018-10-05 LAB — GLUCOSE, CAPILLARY
Glucose-Capillary: 108 mg/dL — ABNORMAL HIGH (ref 70–99)
Glucose-Capillary: 110 mg/dL — ABNORMAL HIGH (ref 70–99)
Glucose-Capillary: 111 mg/dL — ABNORMAL HIGH (ref 70–99)
Glucose-Capillary: 117 mg/dL — ABNORMAL HIGH (ref 70–99)
Glucose-Capillary: 117 mg/dL — ABNORMAL HIGH (ref 70–99)
Glucose-Capillary: 123 mg/dL — ABNORMAL HIGH (ref 70–99)
Glucose-Capillary: 124 mg/dL — ABNORMAL HIGH (ref 70–99)
Glucose-Capillary: 128 mg/dL — ABNORMAL HIGH (ref 70–99)
Glucose-Capillary: 130 mg/dL — ABNORMAL HIGH (ref 70–99)
Glucose-Capillary: 130 mg/dL — ABNORMAL HIGH (ref 70–99)
Glucose-Capillary: 133 mg/dL — ABNORMAL HIGH (ref 70–99)
Glucose-Capillary: 133 mg/dL — ABNORMAL HIGH (ref 70–99)
Glucose-Capillary: 137 mg/dL — ABNORMAL HIGH (ref 70–99)
Glucose-Capillary: 143 mg/dL — ABNORMAL HIGH (ref 70–99)
Glucose-Capillary: 144 mg/dL — ABNORMAL HIGH (ref 70–99)
Glucose-Capillary: 144 mg/dL — ABNORMAL HIGH (ref 70–99)
Glucose-Capillary: 150 mg/dL — ABNORMAL HIGH (ref 70–99)
Glucose-Capillary: 153 mg/dL — ABNORMAL HIGH (ref 70–99)

## 2018-10-05 LAB — BLOOD GAS, ARTERIAL
Acid-base deficit: 0.3 mmol/L (ref 0.0–2.0)
Bicarbonate: 24.2 mmol/L (ref 20.0–28.0)
Drawn by: 280981
O2 Saturation: 97.9 %
Patient temperature: 98.6
pCO2 arterial: 42.2 mmHg (ref 32.0–48.0)
pH, Arterial: 7.376 (ref 7.350–7.450)
pO2, Arterial: 115 mmHg — ABNORMAL HIGH (ref 83.0–108.0)

## 2018-10-05 LAB — PREPARE PLATELET PHERESIS: Unit division: 0

## 2018-10-05 LAB — PREPARE FRESH FROZEN PLASMA

## 2018-10-05 LAB — CBC
HCT: 30.7 % — ABNORMAL LOW (ref 39.0–52.0)
HCT: 30.1 % — ABNORMAL LOW (ref 39.0–52.0)
Hemoglobin: 10 g/dL — ABNORMAL LOW (ref 13.0–17.0)
Hemoglobin: 9.8 g/dL — ABNORMAL LOW (ref 13.0–17.0)
MCH: 32.3 pg (ref 26.0–34.0)
MCH: 32.7 pg (ref 26.0–34.0)
MCHC: 32.6 g/dL (ref 30.0–36.0)
MCHC: 32.6 g/dL (ref 30.0–36.0)
MCV: 99 fL (ref 80.0–100.0)
MCV: 100.3 fL — ABNORMAL HIGH (ref 80.0–100.0)
Platelets: 95 10*3/uL — ABNORMAL LOW (ref 150–400)
Platelets: 86 10*3/uL — ABNORMAL LOW (ref 150–400)
RBC: 3.1 MIL/uL — ABNORMAL LOW (ref 4.22–5.81)
RBC: 3 MIL/uL — ABNORMAL LOW (ref 4.22–5.81)
RDW: 13.4 % (ref 11.5–15.5)
RDW: 13.8 % (ref 11.5–15.5)
WBC: 14.7 10*3/uL — ABNORMAL HIGH (ref 4.0–10.5)
WBC: 12.9 10*3/uL — ABNORMAL HIGH (ref 4.0–10.5)
nRBC: 0 % (ref 0.0–0.2)
nRBC: 0 % (ref 0.0–0.2)

## 2018-10-05 LAB — BASIC METABOLIC PANEL
Anion gap: 7 (ref 5–15)
Anion gap: 9 (ref 5–15)
BUN: 17 mg/dL (ref 8–23)
BUN: 19 mg/dL (ref 8–23)
CO2: 23 mmol/L (ref 22–32)
CO2: 24 mmol/L (ref 22–32)
Calcium: 7.6 mg/dL — ABNORMAL LOW (ref 8.9–10.3)
Calcium: 7.4 mg/dL — ABNORMAL LOW (ref 8.9–10.3)
Chloride: 109 mmol/L (ref 98–111)
Chloride: 105 mmol/L (ref 98–111)
Creatinine, Ser: 1.14 mg/dL (ref 0.61–1.24)
Creatinine, Ser: 1.33 mg/dL — ABNORMAL HIGH (ref 0.61–1.24)
GFR calc Af Amer: >60 mL/min (ref >60)
GFR calc Af Amer: >60 mL/min (ref >60)
GFR calc non Af Amer: >60 mL/min (ref >60)
GFR calc non Af Amer: 54 mL/min — ABNORMAL LOW (ref >60)
Glucose, Bld: 119 mg/dL — ABNORMAL HIGH (ref 70–99)
Glucose, Bld: 141 mg/dL — ABNORMAL HIGH (ref 70–99)
Potassium: 4.5 mmol/L (ref 3.5–5.1)
Potassium: 4.2 mmol/L (ref 3.5–5.1)
Sodium: 139 mmol/L (ref 135–145)
Sodium: 138 mmol/L (ref 135–145)

## 2018-10-05 LAB — BPAM PLATELET PHERESIS
Blood Product Expiration Date: 202006262359
ISSUE DATE / TIME: 202006251210
Unit Type and Rh: 5100

## 2018-10-05 LAB — MAGNESIUM
Magnesium: 2.7 mg/dL — ABNORMAL HIGH (ref 1.7–2.4)
Magnesium: 2.4 mg/dL (ref 1.7–2.4)

## 2018-10-05 MED ORDER — INSULIN DETEMIR 100 UNIT/ML ~~LOC~~ SOLN
15.0000 [IU] | Freq: Two times a day (BID) | SUBCUTANEOUS | Status: DC
  Administered 2018-10-05 – 2018-10-07 (×6): 15 [IU] via SUBCUTANEOUS
  Filled 2018-10-05 (×8): qty 0.15

## 2018-10-05 MED ORDER — INSULIN ASPART 100 UNIT/ML ~~LOC~~ SOLN
0.0000 [IU] | SUBCUTANEOUS | Status: DC
  Administered 2018-10-05 – 2018-10-06 (×3): 2 [IU] via SUBCUTANEOUS
  Administered 2018-10-06: 4 [IU] via SUBCUTANEOUS
  Administered 2018-10-06: 2 [IU] via SUBCUTANEOUS

## 2018-10-05 MED ORDER — FUROSEMIDE 10 MG/ML IJ SOLN
40.0000 mg | Freq: Two times a day (BID) | INTRAMUSCULAR | Status: DC
  Administered 2018-10-05 – 2018-10-08 (×8): 40 mg via INTRAVENOUS
  Filled 2018-10-05 (×10): qty 4

## 2018-10-05 MED ORDER — CHLORHEXIDINE GLUCONATE 0.12 % MT SOLN
OROMUCOSAL | Status: AC
  Administered 2018-10-05: 15 mL via OROMUCOSAL
  Filled 2018-10-05: qty 15

## 2018-10-05 MED ORDER — INSULIN ASPART 100 UNIT/ML ~~LOC~~ SOLN: 0.0000 [IU] | SUBCUTANEOUS | Status: DC

## 2018-10-05 MED FILL — Potassium Chloride Inj 2 mEq/ML: INTRAVENOUS | Qty: 40 | Status: AC

## 2018-10-05 MED FILL — Magnesium Sulfate Inj 50%: INTRAMUSCULAR | Qty: 10 | Status: AC

## 2018-10-05 MED FILL — Heparin Sodium (Porcine) Inj 1000 Unit/ML: INTRAMUSCULAR | Qty: 30 | Status: AC

## 2018-10-05 NOTE — Discharge Summary (Addendum)
Physician Discharge Summary  Patient ID: PHELIX FUDALA MRN: [id number] DOB/AGE: 1951-02-21 68 y.o.  Admit date: 10/04/2018 Discharge date: 11/01/2018  Admission Diagnoses:  Patient Active Problem List   Diagnosis Date Noted  . Cervical herniated disc 06/20/2013   Discharge Diagnoses:   Patient Active Problem List   Diagnosis Date Noted  . 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 Cervical herniated disc 06/20/2013   Discharged Condition: good  History of Present Illness:  Mr. Schoffstall is a 68 yo white male with known history of COPD and CAD S/P CABG x 3 performed in 2007.  He has been experiencing increasing shortness of breath over the past several weeks.  This was felt to be out of proportion to his COPD.  He presented to his Cardiologist Dr. Clayborn Bigness, who felt the patient should undergo cardiac catheterization.  This showed patent sequential saphenous vein graft to his OM 1 and OM 2.  However, his previously placed IMA to the LAD was atretic due to a high grade proximal left subclavian stenosis.  It was felt the LAD distally would be able to be bypassed.  Therefore, [facility] was [facility].  He was evaluated by Dr. Prescott Gum who was in agreement the patient would require coronary bypass.  He would require a pulmonic workup to fully assess the extent of his COPD.  Once completed it was felt the patient would be an acceptable candidate for surgery.  The risks and benefits of the procedure were explained to the patient and he was agreeable to proceed.  Hospital Course:   Mr. Pitera presented to [facility] on 10/05/2018.  He was taken to the operating room and underwent Redo Sternotomy with CABG x 1 utilizing SVG to distal LAD.  He also underwent endoscopic harvest of greater saphenous vein from this left thigh.  He tolerated the procedure without difficulty and was taken to the SICU in stable condition.  He was  extubated the evening of surgery.  During his stay in the SICU the patients chest tubes and arterial lines were removed without difficulty. He was weaned off Milrinone drip as tolerated.  His chest tubes and arterial lines were removed without difficulty.  He was restarted on his home regimen of Cozaar for hypertension.  Follow up CXR showed a right basilar pneumothorax as well as a left sided pneumothorax.  He developed a COPD exacerbation and was treated with aggressive pulmonary toilet with nebulizers, steroids, and home inhaler regimen.  Unfortunately, he developed left sided sub cutaneous emphysema.  He developed worsening shortness of breath and wheezing. CXR was obtained and showed increase in the left sided pneumothorax. Due to this, he underwent left sided chest tube placement.  CXR showed improvement of pneumothorax.  However, he developed increase in sub q air on the left.  The suction was decreased to 20 cm but unfortunately his sub q air again worsened with extension to the right side and into both orbital areas.  CXR showed an increase in the right sided pneumothorax, but it did not warrant chest tube placement.    His sub q air extended down into his right arm.  His chest tube was free from air leak.  This again persisted and developed down into his left arm.  He developed increased work of breathing.  CXR showed an increase in left sided pneumothorax.  He was taken to the operating room on 10/15/2018 and underwent repeat  left sided chest tube placement.  He tolerated the procedure and felt like his breathing was improved post placement.  Chest tubes remained to suction and there was a small air leak. As air leak lessened and then resolved, suction was decreased. IR then placed a 14 French left chest tube on 07/13. Follow up chest x ray showed improvement. Both chest tubes suction was increased to 40 as he had worsening subcutaneous emphysema, especially around face and neck. Dr. Prescott Gum then placed  endobronchial valves on 07/17.  The patient's respiratory status improved.  He was weaned off suction and placed on water seal on 10/28/2018.  His subcutaneous emphysema improved significantly.  His chest tubes were removed on 10/29/2018 and 10/30/2018.  Follow up CXR showed stable appearance of apical pneumothoraces, stable but improved subcutaneous emphysema.  He has been treated with Zithromax for chronic bronchitis and he will continue this at discharge.  He remains hemodynamically stable.  He is ambulating independently.  His wounds are healing without evidence of infection.  He is medically stable for discharge home today.  Significant Diagnostic Studies: angiography:    Ost 1st Mrg lesion is 100% stenosed.  1st LPL lesion is 100% stenosed.  Dist LM to Ost LAD lesion is 100% stenosed.  Origin to Prox Graft lesion is 100% stenosed.  Dist Graft lesion is 50% stenosed.   Conclusion Right heart cath Mean wedge of 20 PA 45/21 mean of 30 Cardiac output of 7.2 Fick  Multivessel coronary disease including coronary bypass Preserved left ventricular function borderline ejection fraction around 50% Native vessels with flush occlusion of the LAD CTO but grafted by LIMA Circumflex large left dominant minor irregularities occlusion of OM1 and OM 2 but grafted RCA nondominant Grafts LIMA to LAD atretic and occluded SVG jump graft from OM1 to OM 2  ,50% mid lesion but patent Significant collaterals right to left Significant collaterals left to left Almost complete revascularization of the LAD through collaterals  Treatments: surgery:   1.  Redo sternotomy. 2.  Redo coronary artery bypass grafting (saphenous vein graft to distal left anterior descending). 3.  Endoscopic harvest of left leg greater saphenous vein. 4.  Placement of right femoral A-line for blood pressure monitoring by Dr. Prescott Gum on 10/04/2018.  5. Placement of left chest tube on 10/15/2018. 6. Placement of 14 French left  chest tube by IR on 10/22/2018. 7. Video bronchoscopy and insertion of endobronchial valves LUL apical (7 mm) and lingula (9 mm) on 10/26/2018.  Discharge Exam: Blood pressure 139/80, pulse 93, temperature 97.6 F (36.4 C), temperature source Oral, resp. rate 19, height 5\' 9"  (1.753 m), weight 92.9 kg, SpO2 100 %.  General appearance: alert, cooperative and no distress Heart: regular rate and rhythm Lungs: clear to auscultation bilaterally Abdomen: soft, non-tender; bowel sounds normal; no masses,  no organomegaly Extremities: edema + pitting Wound: clean and dry  Discharge disposition: 01-Home or Self Care  Discharge Medications:  The patient has been discharged on:   1.Beta Blocker:  Yes [ X  ]                              No   [   ]                              If No, reason:  2.Ace Inhibitor/ARB: Yes [  X ]  No  [    ]                                     If No, reason:  3.Statin:   Yes [ X  ]                  No  [   ]                  If No, reason:  4.Ecasa:  Yes  [  X ]                  No   [   ]                  If No, reason:      Allergies as of 11/01/2018      Reactions   Atorvastatin Other (See Comments)   Leg cramps   Sulfa Antibiotics Rash   Sun Rash       Medication List    TAKE these medications   albuterol 108 (90 Base) MCG/ACT inhaler Commonly known as: VENTOLIN HFA Inhale 2 puffs into the lungs every 6 (six) hours as needed for wheezing or shortness of breath.   albuterol (2.5 MG/3ML) 0.083% nebulizer solution Commonly known as: PROVENTIL Inhale 2.5 mg into the lungs every 6 (six) hours as needed for wheezing or shortness of breath.   aspirin EC 81 MG tablet Take 81 mg by mouth every evening.   azelastine 0.1 % nasal spray Commonly known as: ASTELIN Place 1 spray into both nostrils 2 (two) times daily. Use in each nostril as directed   azithromycin 250 MG tablet Commonly known as: ZITHROMAX Take  250 mg by mouth every Monday, Wednesday, and Friday.   chlorpheniramine-HYDROcodone 10-8 MG/5ML Suer Commonly known as: TUSSIONEX Take 5 mLs by mouth every 12 (twelve) hours as needed for cough.   Denta 5000 Plus 1.1 % Crea dental cream Generic drug: sodium fluoride Take 1 application by mouth at bedtime. Brush teeth for at least 1 min   fexofenadine 180 MG tablet Commonly known as: ALLEGRA Take 180 mg by mouth daily.   fluticasone 50 MCG/ACT nasal spray Commonly known as: FLONASE Place 1 spray into both nostrils daily.   furosemide 40 MG tablet Commonly known as: LASIX Take 1 tablet (40 mg total) by mouth daily.   losartan 25 MG tablet Commonly known as: COZAAR Take 25 mg by mouth daily.   metFORMIN 500 MG tablet Commonly known as: GLUCOPHAGE Take 500 mg by mouth 2 (two) times a day.   metoprolol tartrate 25 MG tablet Commonly known as: LOPRESSOR Take 1 tablet (25 mg total) by mouth 2 (two) times daily. What changed:   medication strength  how much to take   montelukast 10 MG tablet Commonly known as: SINGULAIR Take 10 mg by mouth every evening.   Potassium Chloride ER 20 MEQ Tbcr Take 20 mEq by mouth daily.   rosuvastatin 40 MG tablet Commonly known as: CRESTOR Take 40 mg by mouth daily.   sildenafil 20 MG tablet Commonly known as: REVATIO Take 20-100 mg by mouth daily as needed for erectile dysfunction.   Spiriva Respimat 2.5 MCG/ACT Aers Generic drug: Tiotropium Bromide Monohydrate Inhale 2 puffs into the lungs daily.   Symbicort 160-4.5 MCG/ACT inhaler Generic drug: budesonide-formoterol Inhale 2 puffs into the lungs 2 (two)  times a day.   valACYclovir 500 MG tablet Commonly known as: VALTREX Take 500 mg by mouth every evening.   Vitamin D3 50 MCG (2000 UT) Tabs Take 2,000 Units by mouth every morning.      Follow-up Information    Ivin Poot, MD Follow up on 11/14/2018.   Specialty: Cardiothoracic Surgery Why: Appointment is at  11:30, please get CXR at 11:00 at [facility] located on the first floor of our office building Contact information: [place] [address] [phone]        Yolonda Kida, MD Follow up.   Specialties: Cardiology, Internal Medicine Why: Please contact office at discharge to set up hospital follow up Contact information: [place] [address] [phone]        [facility] Follow up.   Specialty: Cardiothoracic Surgery Contact information: [place], [place] [phone]          Signed: Ellwood Handler PA-C 11/01/2018, 7:34 AM  patient examined and medical record reviewed,agree with above note. [name] 11/04/2018

## 2018-10-05 NOTE — Progress Notes (Signed)
CT surgery p.m. Rounds  Patient comfortable sitting up in chair Air leak with cough has improved since this a.m. Maintaining sinus rhythm Diuresing well P.m. labs reviewed and are satisfactory

## 2018-10-05 NOTE — Discharge Instructions (Signed)

## 2018-10-05 NOTE — Progress Notes (Signed)
1 Day Post-Op Procedure(s) (LRB): REDO CORONARY ARTERY BYPASS GRAFTING (CABG) x 1, SVG TO LAD, USING LEFT GREATER SAPHENOUS VEIN HARVESTED ENDOSCOPICALLY (N/A) TRANSESOPHAGEAL ECHOCARDIOGRAM (TEE) (N/A) Subjective: Minimal pain, nausea  Objective: Vital signs in last 24 hours: Temp:  [95.4 F (35.2 C)-98.8 F (37.1 C)] 98.4 F (36.9 C) (06/26 0930) Pulse Rate:  [79-96] 80 (06/26 0930) Cardiac Rhythm: Atrial paced (06/26 0800) Resp:  [9-23] 13 (06/26 0930) BP: (71-125)/(48-106) 71/48 (06/26 0930) SpO2:  [93 %-100 %] 97 % (06/26 0930) Arterial Line BP: (90-189)/(41-68) 170/61 (06/26 0930) FiO2 (%):  [40 %-50 %] 40 % (06/25 1812) Weight:  [104.8 kg] 104.8 kg (06/26 0600)  Hemodynamic parameters for last 24 hours: PAP: (20-46)/(4-27) 29/21 CO:  [5.9 L/min-8.6 L/min] 8.6 L/min CI:  [2.8 L/min/m2-4 L/min/m2] 4 L/min/m2  Intake/Output from previous day: 06/25 0701 - 06/26 0700 In: 5474 [I.V.:3128.9; Blood:1196; IV Piggyback:1149.1] Out: 8937 [Urine:1960; Blood:1000; Chest Tube:300] Intake/Output this shift: Total I/O In: 502.1 [I.V.:202.1; IV Piggyback:300] Out: 115 [Urine:95; Chest Tube:20]  Positive mild air leak with cough Lungs clear nsr Lab Results: Recent Labs    10/04/18 1948 10/04/18 1949 10/05/18 0509  WBC 15.6*  --  14.7*  HGB 10.8* 10.5* 10.0*  HCT 32.9* 31.0* 30.7*  PLT 125*  --  95*   BMET:  Recent Labs    10/04/18 1327  10/04/18 1948 10/04/18 1949 10/05/18 0509  NA 142   < >  --  140 139  K 3.9   < >  --  4.9 4.5  CL  --   --   --   --  109  CO2  --   --   --   --  23  GLUCOSE 117*  --   --   --  119*  BUN  --   --   --   --  17  CREATININE  --   --  1.05  --  1.14  CALCIUM  --   --   --   --  7.6*   < > = values in this interval not displayed.    PT/INR:  Recent Labs    10/04/18 1326  LABPROT 18.6*  INR 1.6*   ABG    Component Value Date/Time   PHART 7.340 (L) 10/04/2018 1949   HCO3 23.1 10/04/2018 1949   TCO2 24 10/04/2018 1949   ACIDBASEDEF 3.0 (H) 10/04/2018 1949   O2SAT 97.0 10/04/2018 1949   CBG (last 3)  Recent Labs    10/05/18 0309 10/05/18 0358 10/05/18 0506  GLUCAP 130* 117* 108*    Assessment/Plan: S/P Procedure(s) (LRB): REDO CORONARY ARTERY BYPASS GRAFTING (CABG) x 1, SVG TO LAD, USING LEFT GREATER SAPHENOUS VEIN HARVESTED ENDOSCOPICALLY (N/A) TRANSESOPHAGEAL ECHOCARDIOGRAM (TEE) (N/A) Mobilize Diuresis Diabetes control cont milrinone today   LOS: 1 day    Melvin Brewer 10/05/2018

## 2018-10-05 NOTE — Op Note (Signed)
NAME: Melvin Brewer, Melvin Brewer MEDICAL RECORD IZ:[id number] ACCOUNT [id number] DATE OF BIRTH:[birth year] FACILITY: [facility] LOCATION: [facility] PHYSICIAN:PETER VAN TRIGT III, MD  OPERATIVE REPORT  DATE OF PROCEDURE:  10/04/2018  OPERATION: 1.  Redo sternotomy. 2.  Redo coronary artery bypass grafting (saphenous vein graft to distal left anterior descending). 3.  Endoscopic harvest of left leg greater saphenous vein. 4.  Placement of right femoral A-line for blood pressure monitoring.  SURGEON:  Ivin Poot, MD  ASSISTANT:  Linton Ham and Ellwood Handler, PA-C  ANESTHESIA:  General by Dr. Myrtie Soman.  PREOPERATIVE DIAGNOSIS:  Class III exertional angina, severe recurrent coronary artery disease, preserved left ventricular systolic function.  POSTOPERATIVE DIAGNOSIS:  Class III exertional angina, severe recurrent coronary artery disease, preserved left ventricular systolic function.  CLINICAL NOTE:  The patient is a 68 year old reformed smoker with severe COPD and prior history of CABG x3, 14 years ago when he had a non-STEMI.  He developed increasing shortness of breath out of proportion to his COPD and underwent evaluation by  cardiology.  Cardiac catheterization was performed showing a patent sequential vein graft to the OM1 and OM2 but the mammary artery, although still patent, had atretic flow.  There was a tight proximal left subclavian artery stenosis proximal to the  origin of the mammary artery, which probably was flow limiting.  The patient's echo showed preserved LV systolic function without significant valvular disease.  Formal PFTs were performed which showed his FEV1 to be 950 mL, but his diffusion capacity was  56%.  Although he had significant COPD, I felt that his best long-term option for his LAD total occlusion, which filled via collaterals suboptimally was redo CABG.  I discussed the details of the procedure in detail with the patient.  Previously, he had  tolerated sternotomy  with regards to his pulmonary status.  He agreed with surgery since he wanted relief from his lifestyle limiting shortness of breath with exertion out of proportion to his COPD.  He understood the risks of bleeding, blood  transfusion, stroke, infection, organ failure, postoperative arrhythmias, postoperative pulmonary problems including pleural effusion and prolonged ventilator dependence, and death.  He agreed to proceed with surgery after I discussed these issues with  the patient and he demonstrated his understanding.  DESCRIPTION OF PROCEDURE:  The patient was brought to the operating room after informed consent was documented in preoperative holding and final issues addressed.  The patient was induced for general anesthesia under invasive hemodynamic monitoring and  remained stable.  A transesophageal echo was placed by the anesthesia team.  The chest, abdomen and legs were prepped with Betadine and draped as a sterile field.  A proper time-out was performed.  A right femoral A-line was placed for femoral artery  monitoring.  The left greater saphenous vein was removed using endoscopic technique.  It was an adequate-good conduit.  A redo sternotomy was performed using the oscillating saw after the wires were removed, being careful to avoid injury to the underlying vascular structures.  The anterior mediastinum was adherent with dense adhesions.  These were carefully dissected off  the sternum.  The hyperinflated left lung from the emphysema was also carefully dissected off the sternum.  There was some small air leak.  Gradually, the anterior surface of the heart was identified including the right ventricle, the right atrium and  the ascending aorta and a patent sequential vein graft to the circumflex system.  After the anterior and right lateral aspect of the mediastinum were adequately  dissected, the sternal retractor was placed and heparin was administered.  Pursestrings were  placed in the  ascending aorta and right atrium and the patient was cannulated and placed on bypass after the ACT was documented as being therapeutic.  Further dissection was then carried out to free up the apex, identify the mammary artery, which was  left intact and identify the LAD.  Cardioplegia cannulas were placed for antegrade and retrograde cold blood cardioplegia.  The patient was cooled to 32 degrees.  The aortic crossclamp was applied, and a liter of cold blood cardioplegia was delivered in  split doses between the antegrade aortic and retrograde coronary sinus catheters.  There was good cardioplegic arrest and supple temperature dropped less than 12 degrees.  Cardioplegia was delivered every 20 minutes.  After the cardioplegic arrest, the rest of the heart was mobilized by dissection of the adhesions posteriorly.  The heart was elevated on laparotomy pads.  The LAD was dissected from the epicardial fat.  It was a 1.5 mm vessel with diffuse disease, but  an adequate lumen.  The vessel was opened and 1.5 mm.  A reverse saphenous vein was sewn end-to-side with running 7-0 Prolene.  There was good flow through the graft.  Cardioplegia was redosed through the vein.  With the cross clamp still in place, a proximal end-to-side anastomosis of the saphenous vein to the ascending aorta was created with a 4.5 mm punch and running 6-0 Prolene.  Prior to tying down the final proximal anastomosis, air was vented from the  coronaries with a dose of retrograde warm blood cardioplegia.  The crossclamp was removed.  The heart was cardioverted back to a regular rhythm.  The cardioplegia cannulas were removed.  The vein grafts were deaired and opened.  Each had good flow, and hemostasis was documented at the proximal and distal sites.  The patient was rewarmed and  reperfused.  Temporary pacing wires were applied.  Both pleural spaces were opened and drained of any blood and fluid.  The lungs were then reexpanded and the  ventilator was resumed.  The patient was started on low dose milrinone and weaned off  cardiopulmonary bypass without difficulty.  Echo showed preserved LV function.  Hemodynamics and blood pressure were normal.  The cannula was removed after protamine was administered.  There is still diffuse coagulopathy.  The patient's platelet count  came back as abnormally low at 84,000.  The patient was given a platelet transfusion with improved coagulation function.  The mediastinum was irrigated.  The superior pericardial fat was closed over the aorta and vein grafts.  The anterior mediastinum  and bilateral pleural tubes were placed and brought out through separate incisions.  The sternum was closed with wire.  The patient remained stable.  The pectoralis fascia was closed with a running #1 Vicryl.  Subcutaneous and skin layers were closed  with running Vicryl and sterile dressings were applied.  Total cardiopulmonary bypass time was 80 minutes.  TN/NUANCE  D:10/04/2018 T:10/04/2018 JOB:[id number]

## 2018-10-06 ENCOUNTER — Inpatient Hospital Stay (HOSPITAL_COMMUNITY): Payer: Medicare Other

## 2018-10-06 LAB — CBC
HCT: 30 % — ABNORMAL LOW (ref 39.0–52.0)
Hemoglobin: 9.8 g/dL — ABNORMAL LOW (ref 13.0–17.0)
MCH: 32.5 pg (ref 26.0–34.0)
MCHC: 32.7 g/dL (ref 30.0–36.0)
MCV: 99.3 fL (ref 80.0–100.0)
Platelets: 92 10*3/uL — ABNORMAL LOW (ref 150–400)
RBC: 3.02 MIL/uL — ABNORMAL LOW (ref 4.22–5.81)
RDW: 13.9 % (ref 11.5–15.5)
WBC: 13.5 10*3/uL — ABNORMAL HIGH (ref 4.0–10.5)
nRBC: 0 % (ref 0.0–0.2)

## 2018-10-06 LAB — BASIC METABOLIC PANEL
Anion gap: 10 (ref 5–15)
BUN: 21 mg/dL (ref 8–23)
CO2: 24 mmol/L (ref 22–32)
Calcium: 7.5 mg/dL — ABNORMAL LOW (ref 8.9–10.3)
Chloride: 102 mmol/L (ref 98–111)
Creatinine, Ser: 1.23 mg/dL (ref 0.61–1.24)
GFR calc Af Amer: >60 mL/min (ref >60)
GFR calc non Af Amer: 60 mL/min — ABNORMAL LOW (ref >60)
Glucose, Bld: 139 mg/dL — ABNORMAL HIGH (ref 70–99)
Potassium: 3.9 mmol/L (ref 3.5–5.1)
Sodium: 136 mmol/L (ref 135–145)

## 2018-10-06 LAB — GLUCOSE, CAPILLARY
Glucose-Capillary: 113 mg/dL — ABNORMAL HIGH (ref 70–99)
Glucose-Capillary: 123 mg/dL — ABNORMAL HIGH (ref 70–99)
Glucose-Capillary: 132 mg/dL — ABNORMAL HIGH (ref 70–99)
Glucose-Capillary: 147 mg/dL — ABNORMAL HIGH (ref 70–99)
Glucose-Capillary: 161 mg/dL — ABNORMAL HIGH (ref 70–99)
Glucose-Capillary: 93 mg/dL (ref 70–99)

## 2018-10-06 MED ORDER — INSULIN ASPART 100 UNIT/ML ~~LOC~~ SOLN: 0.0000 [IU] | Freq: Every day | SUBCUTANEOUS | Status: DC

## 2018-10-06 MED ORDER — ALBUTEROL SULFATE (2.5 MG/3ML) 0.083% IN NEBU
2.5000 mg | INHALATION_SOLUTION | RESPIRATORY_TRACT | Status: DC | PRN
  Administered 2018-10-07 – 2018-10-21 (×6): 2.5 mg via RESPIRATORY_TRACT
  Filled 2018-10-06 (×4): qty 3

## 2018-10-06 MED ORDER — CHLORHEXIDINE GLUCONATE 0.12 % MT SOLN
OROMUCOSAL | Status: AC
  Administered 2018-10-06: 15 mL via OROMUCOSAL
  Filled 2018-10-06: qty 15

## 2018-10-06 MED ORDER — ALBUTEROL SULFATE (2.5 MG/3ML) 0.083% IN NEBU
2.5000 mg | INHALATION_SOLUTION | Freq: Three times a day (TID) | RESPIRATORY_TRACT | Status: DC
  Administered 2018-10-06 – 2018-10-18 (×38): 2.5 mg via RESPIRATORY_TRACT
  Filled 2018-10-06 (×37): qty 3

## 2018-10-06 MED ORDER — INSULIN ASPART 100 UNIT/ML ~~LOC~~ SOLN
0.0000 [IU] | Freq: Three times a day (TID) | SUBCUTANEOUS | Status: DC
  Administered 2018-10-06: 3 [IU] via SUBCUTANEOUS

## 2018-10-06 NOTE — Progress Notes (Signed)
2 Days Post-Op Procedure(s) (LRB): REDO CORONARY ARTERY BYPASS GRAFTING (CABG) x 1, SVG TO LAD, USING LEFT GREATER SAPHENOUS VEIN HARVESTED ENDOSCOPICALLY (N/A) TRANSESOPHAGEAL ECHOCARDIOGRAM (TEE) (N/A) Subjective: Lungs remain tight - hx copd but off O2 Walked in hall airleak resolved- DC MT today nsr  Objective: Vital signs in last 24 hours: Temp:  [97.7 F (36.5 C)-99.2 F (37.3 C)] 98.2 F (36.8 C) (06/27 1100) Pulse Rate:  [89-117] 99 (06/27 1300) Cardiac Rhythm: Sinus tachycardia;Normal sinus rhythm (06/27 0800) Resp:  [13-32] 19 (06/27 1300) BP: (91-137)/(61-106) 108/76 (06/27 1300) SpO2:  [92 %-100 %] 96 % (06/27 1450) Weight:  [104.8 kg] 104.8 kg (06/27 0500)  Hemodynamic parameters for last 24 hours:  stable  Intake/Output from previous day: 06/26 0701 - 06/27 0700 In: 1619.8 [P.O.:240; I.V.:875.3; IV Piggyback:504.4] Out: 1775 [Urine:1515; Chest Tube:260] Intake/Output this shift: Total I/O In: 164.9 [I.V.:164.9] Out: 1310 [Urine:1250; Chest Tube:60]       Exam    General- alert and comfortable    Neck- no JVD, no cervical adenopathy palpable, no carotid bruit   Lungs-scattered ronchi,wheezes   Cor- regular rate and rhythm, no murmur , gallop   Abdomen- soft, non-tender   Extremities - warm, non-tender, minimal edema   Neuro- oriented, appropriate, no focal weakness   Lab Results: Recent Labs    10/05/18 1622 10/06/18 0348  WBC 12.9* 13.5*  HGB 9.8* 9.8*  HCT 30.1* 30.0*  PLT 86* 92*   BMET:  Recent Labs    10/05/18 1622 10/06/18 0348  NA 138 136  K 4.2 3.9  CL 105 102  CO2 24 24  GLUCOSE 141* 139*  BUN 19 21  CREATININE 1.33* 1.23  CALCIUM 7.4* 7.5*    PT/INR:  Recent Labs    10/04/18 1326  LABPROT 18.6*  INR 1.6*   ABG    Component Value Date/Time   PHART 7.376 10/05/2018 1043   HCO3 24.2 10/05/2018 1043   TCO2 24 10/04/2018 1949   ACIDBASEDEF 0.3 10/05/2018 1043   O2SAT 97.9 10/05/2018 1043   CBG (last 3)  Recent  Labs    10/06/18 0347 10/06/18 0756 10/06/18 1153  GLUCAP 147* 161* 113*    Assessment/Plan: S/P Procedure(s) (LRB): REDO CORONARY ARTERY BYPASS GRAFTING (CABG) x 1, SVG TO LAD, USING LEFT GREATER SAPHENOUS VEIN HARVESTED ENDOSCOPICALLY (N/A) TRANSESOPHAGEAL ECHOCARDIOGRAM (TEE) (N/A) DC mT Pleural tubes to water seal   LOS: 2 days    Melvin Brewer 10/06/2018

## 2018-10-06 NOTE — Progress Notes (Signed)
MT chest tube removed per MD order and remaining pleural tube place on water seal. Patient was premedicated  educated before chest tube was removed. Procedure was tolerated well.. Patient instructed to stay in bed for greater than an hour. Vital signs are stable post removal.

## 2018-10-07 ENCOUNTER — Inpatient Hospital Stay (HOSPITAL_COMMUNITY): Payer: Medicare Other

## 2018-10-07 LAB — BASIC METABOLIC PANEL
Anion gap: 8 (ref 5–15)
BUN: 17 mg/dL (ref 8–23)
CO2: 26 mmol/L (ref 22–32)
Calcium: 7.3 mg/dL — ABNORMAL LOW (ref 8.9–10.3)
Chloride: 102 mmol/L (ref 98–111)
Creatinine, Ser: 1.06 mg/dL (ref 0.61–1.24)
GFR calc Af Amer: >60 mL/min (ref >60)
GFR calc non Af Amer: >60 mL/min (ref >60)
Glucose, Bld: 103 mg/dL — ABNORMAL HIGH (ref 70–99)
Potassium: 3.6 mmol/L (ref 3.5–5.1)
Sodium: 136 mmol/L (ref 135–145)

## 2018-10-07 LAB — GLUCOSE, CAPILLARY
Glucose-Capillary: 92 mg/dL (ref 70–99)
Glucose-Capillary: 114 mg/dL — ABNORMAL HIGH (ref 70–99)
Glucose-Capillary: 108 mg/dL — ABNORMAL HIGH (ref 70–99)

## 2018-10-07 MED ORDER — POTASSIUM CHLORIDE CRYS ER 20 MEQ PO TBCR
20.0000 meq | EXTENDED_RELEASE_TABLET | ORAL | Status: DC
  Administered 2018-10-07: 20 meq via ORAL
  Filled 2018-10-07: qty 1

## 2018-10-07 MED ORDER — WHITE PETROLATUM EX OINT
1.0000 "application " | TOPICAL_OINTMENT | CUTANEOUS | Status: DC | PRN
  Administered 2018-10-07: 1 via TOPICAL
  Filled 2018-10-07: qty 28.35

## 2018-10-07 MED ORDER — SORBITOL 70 % SOLN
60.0000 mL | Freq: Once | Status: AC
  Administered 2018-10-07: 60 mL via ORAL
  Filled 2018-10-07: qty 60

## 2018-10-07 MED ORDER — POTASSIUM CHLORIDE CRYS ER 20 MEQ PO TBCR
20.0000 meq | EXTENDED_RELEASE_TABLET | Freq: Two times a day (BID) | ORAL | Status: DC
  Administered 2018-10-07 – 2018-10-08 (×4): 20 meq via ORAL
  Filled 2018-10-07 (×4): qty 1

## 2018-10-07 NOTE — Progress Notes (Signed)
pt having SOB due to exertion and wheezing.  called RT, respiratory asked to try inhaler first so inhaler given early.    1845 - patient still very SOB and wheezing.  Patient needed to have another BM.  Patient ambulated to bathroom.  While patient in bathroom, patient became tachycardic with heart rate up to 130-140 with NSVT, 3 beat runs of Vtach.  Patient stable with just ongoing SOB and wheezing.  Patient assisted back to chair and RT called, 2000 nebulizer treatment given early.

## 2018-10-07 NOTE — Progress Notes (Signed)
CT surgery p.m. Rounds  Patient had stable day-chest tubes removed. Although he is off oxygen he still has wheezing and needs scheduled and PRN nebulized therapy Had bowel movement today and short walk Hope to move to stepdown unit tomorrow while continuing nebulizer therapy

## 2018-10-07 NOTE — Progress Notes (Signed)
3 Days Post-Op Procedure(s) (LRB): REDO CORONARY ARTERY BYPASS GRAFTING (CABG) x 1, SVG TO LAD, USING LEFT GREATER SAPHENOUS VEIN HARVESTED ENDOSCOPICALLY (N/A) TRANSESOPHAGEAL ECHOCARDIOGRAM (TEE) (N/A) Subjective: Progressing No air leak from chest tube  Objective: Vital signs in last 24 hours: Temp:  [97.8 F (36.6 C)-100.9 F (38.3 C)] 98.3 F (36.8 C) (06/28 0758) Pulse Rate:  [84-117] 94 (06/28 0900) Cardiac Rhythm: Sinus tachycardia;Normal sinus rhythm (06/28 0400) Resp:  [12-32] 24 (06/28 0900) BP: (102-135)/(64-105) 113/65 (06/28 0900) SpO2:  [92 %-100 %] 96 % (06/28 0900) Weight:  [102.7 kg] 102.7 kg (06/28 0500)  Hemodynamic parameters for last 24 hours:    Intake/Output from previous day: 06/27 0701 - 06/28 0700 In: 263.6 [I.V.:263.6] Out: 3055 [Urine:2875; Chest Tube:180] Intake/Output this shift: Total I/O In: 240 [P.O.:240] Out: 0        Exam    General- alert and comfortable    Neck- no JVD, no cervical adenopathy palpable, no carotid bruit   Lungs- clear without rales, wheezes   Cor- regular rate and rhythm, no murmur , gallop   Abdomen- soft, non-tender   Extremities - warm, non-tender, minimal edema   Neuro- oriented, appropriate, no focal weakness   Lab Results: Recent Labs    10/05/18 1622 10/06/18 0348  WBC 12.9* 13.5*  HGB 9.8* 9.8*  HCT 30.1* 30.0*  PLT 86* 92*   BMET:  Recent Labs    10/06/18 0348 10/07/18 0255  NA 136 136  K 3.9 3.6  CL 102 102  CO2 24 26  GLUCOSE 139* 103*  BUN 21 17  CREATININE 1.23 1.06  CALCIUM 7.5* 7.3*    PT/INR:  Recent Labs    10/04/18 1326  LABPROT 18.6*  INR 1.6*   ABG    Component Value Date/Time   PHART 7.376 10/05/2018 1043   HCO3 24.2 10/05/2018 1043   TCO2 24 10/04/2018 1949   ACIDBASEDEF 0.3 10/05/2018 1043   O2SAT 97.9 10/05/2018 1043   CBG (last 3)  Recent Labs    10/06/18 1623 10/06/18 2127 10/07/18 0643  GLUCAP 123* 93 92    Assessment/Plan: S/P Procedure(s)  (LRB): REDO CORONARY ARTERY BYPASS GRAFTING (CABG) x 1, SVG TO LAD, USING LEFT GREATER SAPHENOUS VEIN HARVESTED ENDOSCOPICALLY (N/A) TRANSESOPHAGEAL ECHOCARDIOGRAM (TEE) (N/A) Mobilize Diuresis d/c tubes/lines leave in ICU for pulmonary status, severe COPD   LOS: 3 days    Melvin Brewer 10/07/2018

## 2018-10-08 ENCOUNTER — Inpatient Hospital Stay (HOSPITAL_COMMUNITY): Payer: Medicare Other

## 2018-10-08 LAB — CBC
HCT: 31.1 % — ABNORMAL LOW (ref 39.0–52.0)
Hemoglobin: 10.1 g/dL — ABNORMAL LOW (ref 13.0–17.0)
MCH: 32.2 pg (ref 26.0–34.0)
MCHC: 32.5 g/dL (ref 30.0–36.0)
MCV: 99 fL (ref 80.0–100.0)
Platelets: 128 10*3/uL — ABNORMAL LOW (ref 150–400)
RBC: 3.14 MIL/uL — ABNORMAL LOW (ref 4.22–5.81)
RDW: 14.1 % (ref 11.5–15.5)
WBC: 9.2 10*3/uL (ref 4.0–10.5)
nRBC: 0 % (ref 0.0–0.2)

## 2018-10-08 LAB — COMPREHENSIVE METABOLIC PANEL
ALT: 22 U/L (ref 0–44)
AST: 21 U/L (ref 15–41)
Albumin: 2.7 g/dL — ABNORMAL LOW (ref 3.5–5.0)
Alkaline Phosphatase: 33 U/L — ABNORMAL LOW (ref 38–126)
Anion gap: 9 (ref 5–15)
BUN: 21 mg/dL (ref 8–23)
CO2: 28 mmol/L (ref 22–32)
Calcium: 7.6 mg/dL — ABNORMAL LOW (ref 8.9–10.3)
Chloride: 102 mmol/L (ref 98–111)
Creatinine, Ser: 1.16 mg/dL (ref 0.61–1.24)
GFR calc Af Amer: >60 mL/min (ref >60)
GFR calc non Af Amer: >60 mL/min (ref >60)
Glucose, Bld: 116 mg/dL — ABNORMAL HIGH (ref 70–99)
Potassium: 4.4 mmol/L (ref 3.5–5.1)
Sodium: 139 mmol/L (ref 135–145)
Total Bilirubin: 0.6 mg/dL (ref 0.3–1.2)
Total Protein: 5.3 g/dL — ABNORMAL LOW (ref 6.5–8.1)

## 2018-10-08 LAB — TYPE AND SCREEN
ABO/RH(D): A POS
Antibody Screen: NEGATIVE
Unit division: 0
Unit division: 0
Unit division: 0
Unit division: 0

## 2018-10-08 LAB — BPAM RBC
Blood Product Expiration Date: 202007142359
Blood Product Expiration Date: 202007142359
Blood Product Expiration Date: 202007152359
Blood Product Expiration Date: 202007152359
ISSUE DATE / TIME: 202006250809
ISSUE DATE / TIME: 202006250809
Unit Type and Rh: 6200
Unit Type and Rh: 6200
Unit Type and Rh: 6200
Unit Type and Rh: 6200

## 2018-10-08 LAB — GLUCOSE, CAPILLARY
Glucose-Capillary: 92 mg/dL (ref 70–99)
Glucose-Capillary: 109 mg/dL — ABNORMAL HIGH (ref 70–99)
Glucose-Capillary: 133 mg/dL — ABNORMAL HIGH (ref 70–99)
Glucose-Capillary: 136 mg/dL — ABNORMAL HIGH (ref 70–99)

## 2018-10-08 MED ORDER — TIOTROPIUM BROMIDE MONOHYDRATE 18 MCG IN CAPS: 18.0000 ug | ORAL_CAPSULE | Freq: Every day | RESPIRATORY_TRACT | Status: DC

## 2018-10-08 MED ORDER — METFORMIN HCL 500 MG PO TABS
500.0000 mg | ORAL_TABLET | Freq: Two times a day (BID) | ORAL | Status: DC
  Administered 2018-10-08 – 2018-11-01 (×46): 500 mg via ORAL
  Filled 2018-10-08 (×46): qty 1

## 2018-10-08 MED ORDER — UMECLIDINIUM BROMIDE 62.5 MCG/INH IN AEPB
1.0000 | INHALATION_SPRAY | Freq: Every day | RESPIRATORY_TRACT | Status: DC
  Filled 2018-10-08: qty 7

## 2018-10-08 MED ORDER — NON FORMULARY: 160.0000 | Freq: Two times a day (BID) | Status: DC

## 2018-10-08 MED ORDER — MIDAZOLAM HCL 2 MG/2ML IJ SOLN
INTRAMUSCULAR | Status: AC
  Administered 2018-10-08: 2 mg via INTRAVENOUS
  Filled 2018-10-08: qty 2

## 2018-10-08 MED ORDER — PREDNISONE 20 MG PO TABS
40.0000 mg | ORAL_TABLET | Freq: Once | ORAL | Status: AC
  Administered 2018-10-08: 40 mg via ORAL
  Filled 2018-10-08: qty 2

## 2018-10-08 MED ORDER — BUDESONIDE-FORMOTEROL FUMARATE 160-4.5 MCG/ACT IN AERO
2.0000 | INHALATION_SPRAY | Freq: Two times a day (BID) | RESPIRATORY_TRACT | Status: DC
  Administered 2018-10-09 – 2018-10-21 (×14): 2 via RESPIRATORY_TRACT

## 2018-10-08 MED ORDER — PREDNISONE 20 MG PO TABS
20.0000 mg | ORAL_TABLET | Freq: Every day | ORAL | Status: AC
  Administered 2018-10-09 – 2018-10-13 (×5): 20 mg via ORAL
  Filled 2018-10-08 (×5): qty 1

## 2018-10-08 MED ORDER — GUAIFENESIN ER 600 MG PO TB12
600.0000 mg | ORAL_TABLET | Freq: Two times a day (BID) | ORAL | Status: DC
  Administered 2018-10-08 – 2018-10-19 (×22): 600 mg via ORAL
  Filled 2018-10-08 (×22): qty 1

## 2018-10-08 MED ORDER — METOLAZONE 5 MG PO TABS
5.0000 mg | ORAL_TABLET | Freq: Once | ORAL | Status: AC
  Administered 2018-10-08: 5 mg via ORAL
  Filled 2018-10-08: qty 1

## 2018-10-08 MED ORDER — MIDAZOLAM HCL 2 MG/2ML IJ SOLN
2.0000 mg | Freq: Once | INTRAMUSCULAR | Status: AC
  Administered 2018-10-08: 2 mg via INTRAVENOUS

## 2018-10-08 MED ORDER — LOSARTAN POTASSIUM 25 MG PO TABS
25.0000 mg | ORAL_TABLET | Freq: Every day | ORAL | Status: DC
  Administered 2018-10-08 – 2018-11-01 (×24): 25 mg via ORAL
  Filled 2018-10-08 (×24): qty 1

## 2018-10-08 MED ORDER — LIDOCAINE HCL (PF) 1 % IJ SOLN
30.0000 mL | Freq: Once | INTRAMUSCULAR | Status: AC
  Administered 2018-10-08: 30 mL via INTRADERMAL
  Filled 2018-10-08: qty 30

## 2018-10-08 MED ORDER — INSULIN ASPART 100 UNIT/ML ~~LOC~~ SOLN
0.0000 [IU] | Freq: Three times a day (TID) | SUBCUTANEOUS | Status: DC
  Administered 2018-10-08 – 2018-10-10 (×3): 2 [IU] via SUBCUTANEOUS
  Administered 2018-10-10: 3 [IU] via SUBCUTANEOUS
  Administered 2018-10-11 – 2018-10-12 (×2): 2 [IU] via SUBCUTANEOUS
  Administered 2018-10-12: 3 [IU] via SUBCUTANEOUS
  Administered 2018-10-13 – 2018-10-25 (×15): 2 [IU] via SUBCUTANEOUS
  Administered 2018-10-26 – 2018-10-29 (×3): 3 [IU] via SUBCUTANEOUS
  Administered 2018-10-30: 2 [IU] via SUBCUTANEOUS

## 2018-10-08 MED ORDER — ALPRAZOLAM 0.5 MG PO TABS
0.5000 mg | ORAL_TABLET | Freq: Three times a day (TID) | ORAL | Status: DC | PRN
  Administered 2018-10-08 – 2018-10-29 (×21): 0.5 mg via ORAL
  Filled 2018-10-08 (×21): qty 1

## 2018-10-08 MED ORDER — PREDNISONE 20 MG PO TABS: 20.0000 mg | ORAL_TABLET | Freq: Every day | ORAL | Status: DC

## 2018-10-08 MED ORDER — TIOTROPIUM BROMIDE MONOHYDRATE 2.5 MCG/ACT IN AERS: 5.0000 ug | INHALATION_SPRAY | Freq: Every day | RESPIRATORY_TRACT | Status: DC

## 2018-10-08 MED ORDER — ENOXAPARIN SODIUM 40 MG/0.4ML ~~LOC~~ SOLN
40.0000 mg | SUBCUTANEOUS | Status: DC
  Administered 2018-10-08 – 2018-10-31 (×22): 40 mg via SUBCUTANEOUS
  Filled 2018-10-08 (×22): qty 0.4

## 2018-10-08 MED FILL — Heparin Sodium (Porcine) Inj 1000 Unit/ML: INTRAMUSCULAR | Qty: 10 | Status: AC

## 2018-10-08 MED FILL — Sodium Bicarbonate IV Soln 8.4%: INTRAVENOUS | Qty: 50 | Status: AC

## 2018-10-08 MED FILL — Electrolyte-R (PH 7.4) Solution: INTRAVENOUS | Qty: 4000 | Status: AC

## 2018-10-08 MED FILL — Sodium Chloride IV Soln 0.9%: INTRAVENOUS | Qty: 2000 | Status: AC

## 2018-10-08 MED FILL — Mannitol IV Soln 20%: INTRAVENOUS | Qty: 500 | Status: AC

## 2018-10-08 MED FILL — Lidocaine HCl Local Soln Prefilled Syringe 100 MG/5ML (2%): INTRAMUSCULAR | Qty: 5 | Status: AC

## 2018-10-08 MED FILL — Albumin, Human Inj 5%: INTRAVENOUS | Qty: 250 | Status: AC

## 2018-10-08 NOTE — Progress Notes (Signed)
Called to bedside by patient's nurse.  She states he has become more short of breath with development of left sided sub q air.  Patient admits to being short of breath.  He is visibly tachypneic.  BP (!) 150/75   Pulse (!) 101   Temp 98 F (36.7 C) (Oral)   Resp (!) 24   Ht 5\' 9"  (1.753 m)   Wt 100.7 kg   SpO2 96%   BMI 32.78 kg/m   Gen: mildly distress Heart: RRR Lung: Wheezing bilaterally   STAT CXR: shows sub q air along left chest, increase in basilar pneumothorax   A/P:  1. Reviewed CXR with Dr. Prescott Gum-- will place bedside chest tube 2. May benefit from pulmonary consult   Ellwood Handler, PA-C

## 2018-10-08 NOTE — Progress Notes (Signed)
AV Pacing wires removed per protocol. Patient instructed to remain in bed until 920. Verbalized understanding.

## 2018-10-08 NOTE — Progress Notes (Signed)
Increased SOB, wheezing while sitting up in chair. PRN albuterol treatment given per RT.  Ellwood Handler, PA notified and chest xray ordered. Cool cloths and fan on patient. Small amount of sub Q air noted to left chest and neck. Patient helped back to bed. Erin now at bedside.

## 2018-10-08 NOTE — Progress Notes (Addendum)
EVENING ROUNDS NOTE :     [address]       [place] [address]             [phone]                 4 Days Post-Op Procedure(s) (LRB): REDO CORONARY ARTERY BYPASS GRAFTING (CABG) x 1, SVG TO LAD, USING LEFT GREATER SAPHENOUS VEIN HARVESTED ENDOSCOPICALLY (N/A) TRANSESOPHAGEAL ECHOCARDIOGRAM (TEE) (N/A)  Total Length of Stay:  LOS: 4 days  BP 126/80   Pulse 88   Temp 97.9 F (36.6 C) (Oral)   Resp 13   Ht 5\' 9"  (1.753 m)   Wt 100.7 kg   SpO2 98%   BMI 32.78 kg/m   .Intake/Output      06/28 0701 - 06/29 0700 06/29 0701 - 06/30 0700   P.O. 840 240   I.V. (mL/kg)     Total Intake(mL/kg) 840 (8.3) 240 (2.4)   Urine (mL/kg/hr) 1075 (0.4) 1450 (1.3)   Stool 0    Chest Tube 50 0   Total Output 1125 1450   Net -285 -1210        Urine Occurrence 3 x    Stool Occurrence 6 x         Lab Results  Component Value Date   WBC 9.2 10/08/2018   HGB 10.1 (L) 10/08/2018   HCT 31.1 (L) 10/08/2018   PLT 128 (L) 10/08/2018   GLUCOSE 116 (H) 10/08/2018   ALT 22 10/08/2018   AST 21 10/08/2018   NA 139 10/08/2018   K 4.4 10/08/2018   CL 102 10/08/2018   CREATININE 1.16 10/08/2018   BUN 21 10/08/2018   CO2 28 10/08/2018   INR 1.6 (H) 10/04/2018   HGBA1C 6.2 (H) 10/01/2018   Developed sub q air , Dr PVT placed chest tube right this afternoon Now still with sub q air including eyes Follow up chest xray  Tube good position, no ptx  Grace Isaac MD  Beeper [phone] Office [phone] 10/08/2018 5:43 PM

## 2018-10-08 NOTE — Progress Notes (Addendum)
[facility] DAILY ICU PROGRESS NOTE                   [address]            [place] [address]          [phone]   4 Days Post-Op Procedure(s) (LRB): REDO CORONARY ARTERY BYPASS GRAFTING (CABG) x 1, SVG TO LAD, USING LEFT GREATER SAPHENOUS VEIN HARVESTED ENDOSCOPICALLY (N/A) TRANSESOPHAGEAL ECHOCARDIOGRAM (TEE) (N/A)  Total Length of Stay:  LOS: 4 days   Subjective:  No new complaints.  States he feels like he is doing well, other than his breathing.  + ambulation + BM  Objective: Vital signs in last 24 hours: Temp:  [98.1 F (36.7 C)-98.6 F (37 C)] 98.6 F (37 C) (06/29 0400) Pulse Rate:  [73-108] 90 (06/29 0700) Cardiac Rhythm: Normal sinus rhythm (06/29 0400) Resp:  [13-27] 16 (06/29 0700) BP: (100-163)/(50-96) 135/56 (06/29 0700) SpO2:  [93 %-100 %] 98 % (06/29 0700) Weight:  [100.7 kg] 100.7 kg (06/29 0500)  Filed Weights   10/06/18 0500 10/07/18 0500 10/08/18 0500  Weight: 104.8 kg 102.7 kg 100.7 kg    Weight change: -2 kg   Intake/Output from previous day: 06/28 0701 - 06/29 0700 In: 840 [P.O.:840] Out: 1125 [Urine:1075; Chest Tube:50]  Current Meds: Scheduled Meds: . acetaminophen  1,000 mg Oral Q6H   Or  . acetaminophen (TYLENOL) oral liquid 160 mg/5 mL  1,000 mg Per Tube Q6H  . albuterol  2.5 mg Inhalation TID  . aspirin EC  325 mg Oral Daily   Or  . aspirin  324 mg Per Tube Daily  . azithromycin  250 mg Oral Q M,W,F  . bisacodyl  10 mg Oral Daily   Or  . bisacodyl  10 mg Rectal Daily  . Chlorhexidine Gluconate Cloth  6 each Topical Daily  . docusate sodium  200 mg Oral Daily  . fluticasone  1 spray Each Nare Daily  . furosemide  40 mg Intravenous BID  . insulin detemir  15 Units Subcutaneous BID  . loratadine  10 mg Oral Daily  . losartan  25 mg Oral Daily  . metoprolol tartrate  12.5 mg Oral BID   Or  . metoprolol tartrate  12.5 mg Per Tube BID  . mometasone-formoterol  2 puff Inhalation BID  . montelukast  10 mg Oral QPM   . pantoprazole  40 mg Oral Daily  . potassium chloride  20 mEq Oral BID  . rosuvastatin  40 mg Oral Daily  . sodium chloride flush  3 mL Intravenous Q12H  . umeclidinium bromide  1 puff Inhalation Daily  . valACYclovir  500 mg Oral QPM   Continuous Infusions: PRN Meds:.albuterol, metoprolol tartrate, morphine injection, ondansetron (ZOFRAN) IV, oxyCODONE, sodium chloride flush, traMADol, white petrolatum  General appearance: alert, cooperative and no distress Heart: regular rate and rhythm Lungs: wheezes mild bilaterally Abdomen: soft, non-tender; bowel sounds normal; no masses,  no organomegaly Extremities: edema 1+ Wound: clean and dry  Lab Results: CBC: Recent Labs    10/06/18 0348 10/08/18 0303  WBC 13.5* 9.2  HGB 9.8* 10.1*  HCT 30.0* 31.1*  PLT 92* 128*   BMET:  Recent Labs    10/07/18 0255 10/08/18 0303  NA 136 139  K 3.6 4.4  CL 102 102  CO2 26 28  GLUCOSE 103* 116*  BUN 17 21  CREATININE 1.06 1.16  CALCIUM 7.3* 7.6*    CMET: Lab Results  Component Value Date   WBC 9.2 10/08/2018   HGB 10.1 (L) 10/08/2018   HCT 31.1 (L) 10/08/2018   PLT 128 (L) 10/08/2018   GLUCOSE 116 (H) 10/08/2018   ALT 22 10/08/2018   AST 21 10/08/2018   NA 139 10/08/2018   K 4.4 10/08/2018   CL 102 10/08/2018   CREATININE 1.16 10/08/2018   BUN 21 10/08/2018   CO2 28 10/08/2018   INR 1.6 (H) 10/04/2018   HGBA1C 6.2 (H) 10/01/2018      PT/INR: No results for input(s): LABPROT, INR in the last 72 hours. Radiology: Dg Chest 2 View  Result Date: 10/08/2018 CLINICAL DATA:  History of CABG. EXAM: CHEST - 2 VIEW COMPARISON:  10/07/2018. FINDINGS: Interval removal bilateral chest tubes and right IJ line. Small left pneumothorax noted. Tiny right base pneumothorax. Interim near complete clearing of bibasilar atelectasis/infiltrates. Mediastinum hilar structures normal. Prior CABG. Stable cardiomegaly. Prior cervicothoracic spine fusion. IMPRESSION: 1. Interim removal of bilateral  chest tubes and right IJ line. Small left pneumothorax noted on today's exam. Tiny right base pneumothorax noted. 2.  Prior CABG.  Stable cardiomegaly. 3. Interim near complete clearing of bibasilar atelectasis/infiltrates. Critical Value/emergent results were called by telephone at the time of interpretation on 10/08/2018 at 7:10 am to nurse Ron Agee , who verbally acknowledged these results. Electronically Signed   By: Marcello Moores  Register   On: 10/08/2018 07:11     Assessment/Plan: S/P Procedure(s) (LRB): REDO CORONARY ARTERY BYPASS GRAFTING (CABG) x 1, SVG TO LAD, USING LEFT GREATER SAPHENOUS VEIN HARVESTED ENDOSCOPICALLY (N/A) TRANSESOPHAGEAL ECHOCARDIOGRAM (TEE) (N/A)  1. CV- NSR, BP is running up into the 160s at times- will continue Lopressor at 12.5 mg BID, restart home Cozaar 2. Pulm- underlying COPD, CXR with trace left pleural effusion, small pneumothorax on left, trace pneumothorax right base... continue aggressive pulmonary toilet, minimal atelectasis on CXR 3. Renal- creatinine is at baseline, remains edematous on exam, on IV Lasix, currently, will give a dose of Metolazone today 4. Expected post operative blood loss anemia, stable at 10.1 5. Thrombocytopenia, improved up to 128... will start Lovenox for DVT prophylaxis 6. DM- sugars haven't been checked since yesterday, will transition to home regimen of Metformin, reorder sliding scale and cbg checks 7. Dispo-patient stable, maintaining NSR, restart home cozaar for better BP control... breathing is biggest issues, continue aggressive pulmonary toilet, wean oxygen as able,   Patient was not on Lovenox.. need to monitor for signs of PE, restarted Lovenox today    Ellwood Handler 10/08/2018 7:24 AM   Extreme DOE with  Wheeze on minimal activity and good O2 sat - will start po prednisone and get him on symbicort which he uses at home Hold transfer until COPD flare is improved Small bilat pneumo after tubes remove\d  patient examined and  medical record reviewed,agree with above note. [name] 10/08/2018

## 2018-10-09 ENCOUNTER — Inpatient Hospital Stay (HOSPITAL_COMMUNITY): Payer: Medicare Other

## 2018-10-09 ENCOUNTER — Encounter (HOSPITAL_COMMUNITY): Payer: Self-pay | Admitting: Certified Registered Nurse Anesthetist

## 2018-10-09 LAB — BASIC METABOLIC PANEL
Anion gap: 14 (ref 5–15)
BUN: 29 mg/dL — ABNORMAL HIGH (ref 8–23)
CO2: 23 mmol/L (ref 22–32)
Calcium: 8 mg/dL — ABNORMAL LOW (ref 8.9–10.3)
Chloride: 97 mmol/L — ABNORMAL LOW (ref 98–111)
Creatinine, Ser: 1.16 mg/dL (ref 0.61–1.24)
GFR calc Af Amer: >60 mL/min (ref >60)
GFR calc non Af Amer: >60 mL/min (ref >60)
Glucose, Bld: 110 mg/dL — ABNORMAL HIGH (ref 70–99)
Potassium: 6 mmol/L — ABNORMAL HIGH (ref 3.5–5.1)
Sodium: 134 mmol/L — ABNORMAL LOW (ref 135–145)

## 2018-10-09 LAB — BASIC METABOLIC PANEL WITH GFR
Anion gap: 12 (ref 5–15)
BUN: 34 mg/dL — ABNORMAL HIGH (ref 8–23)
CO2: 30 mmol/L (ref 22–32)
Calcium: 8.6 mg/dL — ABNORMAL LOW (ref 8.9–10.3)
Chloride: 95 mmol/L — ABNORMAL LOW (ref 98–111)
Creatinine, Ser: 1.23 mg/dL (ref 0.61–1.24)
GFR calc Af Amer: >60 mL/min
GFR calc non Af Amer: 60 mL/min — ABNORMAL LOW
Glucose, Bld: 131 mg/dL — ABNORMAL HIGH (ref 70–99)
Potassium: 4.8 mmol/L (ref 3.5–5.1)
Sodium: 137 mmol/L (ref 135–145)

## 2018-10-09 LAB — CBC
HCT: 34.1 % — ABNORMAL LOW (ref 39.0–52.0)
Hemoglobin: 11.4 g/dL — ABNORMAL LOW (ref 13.0–17.0)
MCH: 32.2 pg (ref 26.0–34.0)
MCHC: 33.4 g/dL (ref 30.0–36.0)
MCV: 96.3 fL (ref 80.0–100.0)
Platelets: UNDETERMINED 10*3/uL (ref 150–400)
RBC: 3.54 MIL/uL — ABNORMAL LOW (ref 4.22–5.81)
RDW: 14.1 % (ref 11.5–15.5)
WBC: 9.3 10*3/uL (ref 4.0–10.5)
nRBC: 0 % (ref 0.0–0.2)

## 2018-10-09 LAB — GLUCOSE, CAPILLARY
Glucose-Capillary: 91 mg/dL (ref 70–99)
Glucose-Capillary: 105 mg/dL — ABNORMAL HIGH (ref 70–99)
Glucose-Capillary: 136 mg/dL — ABNORMAL HIGH (ref 70–99)
Glucose-Capillary: 132 mg/dL — ABNORMAL HIGH (ref 70–99)

## 2018-10-09 MED ORDER — FUROSEMIDE 40 MG PO TABS
40.0000 mg | ORAL_TABLET | Freq: Every day | ORAL | Status: DC
  Administered 2018-10-09 – 2018-10-24 (×15): 40 mg via ORAL
  Filled 2018-10-09 (×16): qty 1

## 2018-10-09 MED ORDER — MIDAZOLAM HCL 2 MG/2ML IJ SOLN
2.0000 mg | Freq: Once | INTRAMUSCULAR | Status: AC
  Administered 2018-10-09: 2 mg via INTRAVENOUS
  Filled 2018-10-09: qty 2

## 2018-10-09 MED ORDER — LIDOCAINE 1% INJECTION FOR CIRCUMCISION
30.0000 mL | INJECTION | Freq: Once | INTRAVENOUS | Status: DC
  Filled 2018-10-09: qty 30

## 2018-10-09 MED ORDER — LIDOCAINE HCL (PF) 1 % IJ SOLN
30.0000 mL | Freq: Once | INTRAMUSCULAR | Status: AC
  Administered 2018-10-09: 30 mL
  Filled 2018-10-09: qty 30

## 2018-10-09 NOTE — Progress Notes (Signed)
Patient ID: Melvin Brewer, male   DOB: 1950/11/06, 68 y.o.   MRN: [id number] [facility] Evening Rounds  Hemodynamically stable, sats 99%.   Left chest tube has small intermittent air leak on suction.   Subcutaneous emphysema up into face with eyes closed.  PVT planning insertion of right chest tube in OR tomorrow.

## 2018-10-09 NOTE — Progress Notes (Signed)
AM labs reviewed... K is 6.0, will stop supplementation and repeat level this evening.   CXR: shows improvement of left pneumothorax, with increase in sub a emphysema, Right side shows increase in pneumothorax   CT suction was decreased to 20 cm, but unfortunately patient's sub q emphysema increased... he was placed back to 30 cm suction   A/P:  1. Stop Potassium, repeat level this afternoon 2. Review CXR with with Dr. Prescott Gum, patient may require a right sided chest tube   Ellwood Handler, PA-C

## 2018-10-09 NOTE — Op Note (Signed)
NAME: Melvin Brewer, Melvin Brewer MEDICAL RECORD KC:[id number] ACCOUNT [id number] DATE OF BIRTH:1950-10-01 FACILITY: [facility] LOCATION: [facility] PHYSICIAN:Carmen Vallecillo VAN TRIGT III, MD  OPERATIVE REPORT  DATE OF PROCEDURE:  10/08/2018  OPERATION:  Placement of left chest tube in the ICU.  PREOPERATIVE DIAGNOSES:  Left pneumothorax with subcutaneous emphysema, status post redo coronary artery bypass grafting in a 68 year old gentleman with severe emphysema, chronic obstructive pulmonary disease.  POSTOPERATIVE DIAGNOSES:  Left pneumothorax with subcutaneous emphysema, status post redo coronary artery bypass grafting in a 67 year old gentleman with severe emphysema, chronic obstructive pulmonary disease.  SURGEON:  [name], MD  ANESTHESIA:  Local 1% lidocaine with IV conscious sedation.  DESCRIPTION OF PROCEDURE:  After reviewing the patient's chest x-ray and examining the patient, noted the new onset of a left lateral pneumothorax associated with increased respiratory efforts in this patient with marginal respiratory reserve.  He had  developed subcutaneous emphysema, mainly on the left side of his chest.  I recommended and consented the patient for a left chest tube placement.  The left chest was prepped and draped as a sterile field.  A proper time-out was performed.  1% local lidocaine was infiltrated in the inframammary crease.  A small incision was made.  Lidocaine was further infiltrated in the subcutaneous tissue and down  to the chest wall.  The patient was given IV morphine and Versed.  He remained with stable vital signs.  I placed some more local lidocaine into the intercostal muscle.  I then placed a hemostat through the intercostal muscle and into the chest and a large amount of air under pressure exited.  Through the tunnel, I placed a 24-French chest tube over trocar  and directed to the apex, as far as it would go.  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  The chest tube was connected to underwater Pleur-Evac drainage system.  A sterile  dressing was placed and the chest x-ray was ordered.    The patient tolerated the procedure well.  AN/NUANCE  D:10/08/2018 T:10/09/2018 JOB:[id number]

## 2018-10-09 NOTE — Progress Notes (Addendum)
[address]       [place] [address]             [phone]      5 Days Post-Op Procedure(s) (LRB): REDO CORONARY ARTERY BYPASS GRAFTING (CABG) x 1, SVG TO LAD, USING LEFT GREATER SAPHENOUS VEIN HARVESTED ENDOSCOPICALLY (N/A) TRANSESOPHAGEAL ECHOCARDIOGRAM (TEE) (N/A)   Subjective:  Patient is breathing much better, denies being short of breath this morning.  Is noticeably more swollen along left upper chest, with extension into neck and face.  He states he feels like he is talking through a straw, some mild difficulty with swallowing.  Objective: Vital signs in last 24 hours: Temp:  [96.6 F (35.9 C)-98.2 F (36.8 C)] 96.6 F (35.9 C) (06/30 0642) Pulse Rate:  [69-107] 84 (06/30 0700) Cardiac Rhythm: Normal sinus rhythm (06/29 2300) Resp:  [11-28] 15 (06/30 0700) BP: (89-177)/(60-95) 159/80 (06/30 0700) SpO2:  [93 %-100 %] 100 % (06/30 0700) Weight:  [96.9 kg] 96.9 kg (06/30 0500)  Intake/Output from previous day: 06/29 0701 - 06/30 0700 In: 240 [P.O.:240] Out: 2845 [Urine:2825; Chest Tube:20]  General appearance: alert, cooperative and no distress Heart: regular rate and rhythm Lungs: wheezes bilaterally, improved Abdomen: soft, non-tender; bowel sounds normal; no masses,  no organomegaly Extremities: edema trace Wound: clean and dry  Lab Results: Recent Labs    10/08/18 0303  WBC 9.2  HGB 10.1*  HCT 31.1*  PLT 128*   BMET:  Recent Labs    10/07/18 0255 10/08/18 0303  NA 136 139  K 3.6 4.4  CL 102 102  CO2 26 28  GLUCOSE 103* 116*  BUN 17 21  CREATININE 1.06 1.16  CALCIUM 7.3* 7.6*    PT/INR: No results for input(s): LABPROT, INR in the last 72 hours. ABG    Component Value Date/Time   PHART 7.376 10/05/2018 1043   HCO3 24.2 10/05/2018 1043   TCO2 24 10/04/2018 1949   ACIDBASEDEF 0.3 10/05/2018 1043   O2SAT 97.9 10/05/2018 1043   CBG (last 3)  Recent Labs    10/08/18 1554 10/08/18 2040 10/09/18 0634  GLUCAP  133* 136* 91    Assessment/Plan: S/P Procedure(s) (LRB): REDO CORONARY ARTERY BYPASS GRAFTING (CABG) x 1, SVG TO LAD, USING LEFT GREATER SAPHENOUS VEIN HARVESTED ENDOSCOPICALLY (N/A) TRANSESOPHAGEAL ECHOCARDIOGRAM (TEE) (N/A)  1. CV- NSR, BP is elevated at times- continue Cozaar, increase Lopressor to 25 mg BID ( of note he was on 50 BID prior to admission) 2. Pulm- S/P Chest tube placement yesterday, worsening sub q air today with extension into neck and face, minimal air leak... leave chest tube on suction review, CXR once complete 3. Renal- creatinine has been stable, if accurate he diuresed 9 lbs yesterday, will transition to oral Lasix 40 mg daily 4. DM- cbgs controlled 5. dispo- patient stable, breathing is much improved yesterday, S/P Chest tube for pneumothorax with sub q air, minimal air leak present, worsening sub q air along left chest, leave chest tube to suction, labs, CXR remain pending... good diureses yesterday, transition to oral regimen of Lasix   LOS: 5 days    Ellwood Handler PA-C [phone]   10/09/2018   Subcutaneous air from severe COPD-emphysema and redo  chest surgery. Small R apical pntx on cxr x2  Attempted to place  rightchest tube at bedside but patient couldnot tolerate and with sig sub Q air landmarks difficult- will place in OR in AM  O2 sats 99% with eyelids shut due  to subQ air  patient examined and medical record reviewed,agree with above note. [name] 10/09/2018

## 2018-10-10 ENCOUNTER — Inpatient Hospital Stay (HOSPITAL_COMMUNITY): Payer: Medicare Other

## 2018-10-10 ENCOUNTER — Encounter (HOSPITAL_COMMUNITY)
Admission: RE | Disposition: A | Discharge status: Home / Self Care | Payer: Self-pay | Source: Home / Self Care | Attending: Cardiothoracic Surgery

## 2018-10-10 LAB — BASIC METABOLIC PANEL
Anion gap: 11 (ref 5–15)
BUN: 34 mg/dL — ABNORMAL HIGH (ref 8–23)
CO2: 31 mmol/L (ref 22–32)
Calcium: 8.7 mg/dL — ABNORMAL LOW (ref 8.9–10.3)
Chloride: 96 mmol/L — ABNORMAL LOW (ref 98–111)
Creatinine, Ser: 1.23 mg/dL (ref 0.61–1.24)
GFR calc Af Amer: >60 mL/min (ref >60)
GFR calc non Af Amer: 60 mL/min — ABNORMAL LOW (ref >60)
Glucose, Bld: 115 mg/dL — ABNORMAL HIGH (ref 70–99)
Potassium: 4 mmol/L (ref 3.5–5.1)
Sodium: 138 mmol/L (ref 135–145)

## 2018-10-10 LAB — CBC
HCT: 31.7 % — ABNORMAL LOW (ref 39.0–52.0)
Hemoglobin: 10.3 g/dL — ABNORMAL LOW (ref 13.0–17.0)
MCH: 31.8 pg (ref 26.0–34.0)
MCHC: 32.5 g/dL (ref 30.0–36.0)
MCV: 97.8 fL (ref 80.0–100.0)
Platelets: 235 10*3/uL (ref 150–400)
RBC: 3.24 MIL/uL — ABNORMAL LOW (ref 4.22–5.81)
RDW: 13.8 % (ref 11.5–15.5)
WBC: 9.1 10*3/uL (ref 4.0–10.5)
nRBC: 0 % (ref 0.0–0.2)

## 2018-10-10 LAB — GLUCOSE, CAPILLARY
Glucose-Capillary: 97 mg/dL (ref 70–99)
Glucose-Capillary: 124 mg/dL — ABNORMAL HIGH (ref 70–99)
Glucose-Capillary: 152 mg/dL — ABNORMAL HIGH (ref 70–99)
Glucose-Capillary: 117 mg/dL — ABNORMAL HIGH (ref 70–99)

## 2018-10-10 SURGERY — CHEST TUBE INSERTION
Anesthesia: General | Laterality: Right

## 2018-10-10 MED ORDER — FENTANYL CITRATE (PF) 250 MCG/5ML IJ SOLN
INTRAMUSCULAR | Status: AC
  Filled 2018-10-10: qty 5

## 2018-10-10 MED ORDER — DEXAMETHASONE SODIUM PHOSPHATE 10 MG/ML IJ SOLN
INTRAMUSCULAR | Status: AC
  Filled 2018-10-10: qty 1

## 2018-10-10 MED ORDER — SUCCINYLCHOLINE CHLORIDE 200 MG/10ML IV SOSY
PREFILLED_SYRINGE | INTRAVENOUS | Status: AC
  Filled 2018-10-10: qty 10

## 2018-10-10 MED ORDER — ROCURONIUM BROMIDE 10 MG/ML (PF) SYRINGE
PREFILLED_SYRINGE | INTRAVENOUS | Status: AC
  Filled 2018-10-10: qty 10

## 2018-10-10 MED ORDER — LIDOCAINE 2% (20 MG/ML) 5 ML SYRINGE
INTRAMUSCULAR | Status: AC
  Filled 2018-10-10: qty 5

## 2018-10-10 MED ORDER — MIDAZOLAM HCL 2 MG/2ML IJ SOLN
INTRAMUSCULAR | Status: AC
  Filled 2018-10-10: qty 2

## 2018-10-10 MED ORDER — PROPOFOL 10 MG/ML IV BOLUS
INTRAVENOUS | Status: AC
  Filled 2018-10-10: qty 20

## 2018-10-10 MED ORDER — ONDANSETRON HCL 4 MG/2ML IJ SOLN
INTRAMUSCULAR | Status: AC
  Filled 2018-10-10: qty 2

## 2018-10-10 NOTE — Progress Notes (Signed)
TCTS Evening Rounds POD #6 s/p redo CABG x 1 Stable subcutaneous emphysema No specific complaints. Continue chest tube drainage.

## 2018-10-10 NOTE — Progress Notes (Addendum)
      [address]       [place] [address]             [phone]      6 Days Post-Op Procedure(s) (LRB): REDO CORONARY ARTERY BYPASS GRAFTING (CABG) x 1, SVG TO LAD, USING LEFT GREATER SAPHENOUS VEIN HARVESTED ENDOSCOPICALLY (N/A) TRANSESOPHAGEAL ECHOCARDIOGRAM (TEE) (N/A)   Subjective:  No new complaints.  Sub q air remains extensive up into face and bilateral orbits  Objective: Vital signs in last 24 hours: Temp:  [97.7 F (36.5 C)-98.3 F (36.8 C)] 97.8 F (36.6 C) (07/01 0756) Pulse Rate:  [75-98] 90 (07/01 0756) Cardiac Rhythm: Normal sinus rhythm (07/01 0400) Resp:  [11-18] 18 (07/01 0756) BP: (98-165)/(58-89) 158/88 (07/01 0756) SpO2:  [92 %-100 %] 99 % (07/01 0756) Weight:  [93.5 kg] 93.5 kg (07/01 0500)  Intake/Output from previous day: 06/30 0701 - 07/01 0700 In: 363 [P.O.:360; I.V.:3] Out: 1600 [Urine:1600]  General appearance: alert, cooperative and no distress Heart: regular rate and rhythm Lungs: wheezes bilaterally Abdomen: soft, non-tender; bowel sounds normal; no masses,  no organomegaly Extremities: edema trace Wound: clean and dry  Lab Results: Recent Labs    10/09/18 0711 10/10/18 0228  WBC 9.3 9.1  HGB 11.4* 10.3*  HCT 34.1* 31.7*  PLT PLATELET CLUMPS NOTED ON SMEAR, UNABLE TO ESTIMATE 235   BMET:  Recent Labs    10/09/18 1819 10/10/18 0228  NA 137 138  K 4.8 4.0  CL 95* 96*  CO2 30 31  GLUCOSE 131* 115*  BUN 34* 34*  CREATININE 1.23 1.23  CALCIUM 8.6* 8.7*    PT/INR: No results for input(s): LABPROT, INR in the last 72 hours. ABG    Component Value Date/Time   PHART 7.376 10/05/2018 1043   HCO3 24.2 10/05/2018 1043   TCO2 24 10/04/2018 1949   ACIDBASEDEF 0.3 10/05/2018 1043   O2SAT 97.9 10/05/2018 1043   CBG (last 3)  Recent Labs    10/09/18 1610 10/09/18 2158 10/10/18 0640  GLUCAP 136* 132* 97    Assessment/Plan: S/P Procedure(s) (LRB): REDO CORONARY ARTERY BYPASS GRAFTING (CABG) x 1, SVG  TO LAD, USING LEFT GREATER SAPHENOUS VEIN HARVESTED ENDOSCOPICALLY (N/A) TRANSESOPHAGEAL ECHOCARDIOGRAM (TEE) (N/A)  1. CV- NSR, BP is stable- continue Lopressor, Cozaar 2. Pulm- extensive sub q air persists bilaterally with extension into orbits, CXR with stable right sided pneumothorax, left sided pneumothorax is improved, CT with minimal 1+ air leak... leave chest tube on suction 3. Renal- creatinine stable, weight is trending down, continue Lasix 4. Hyperkalemia- resolved, K is down to 4.0, will monitor 5. DM- cbgs controlled 6. Dispo- patient with extensive sub q air, denies shortness of breath, leave chest tube to suction, pneumothorax improved on left, right pneumothorax is stable, Hyperkalemia resolved, continue diuretics, repeat CXR in AM   LOS: 6 days    Erin Barrett 10/10/2018 subQ air of face and eyes CXR with L chest tube in good position, tiny space R apex but since source of air is from L lung will hold on R chest tube Leave in ICU for observation  patient examined and medical record reviewed,agree with above note. [name] 10/10/2018

## 2018-10-10 NOTE — Progress Notes (Signed)
Pre Procedure note for inpatients:   Melvin Brewer has been scheduled for Procedure(s): REDO CORONARY ARTERY BYPASS GRAFTING (CABG) x 1, SVG TO LAD, USING LEFT GREATER SAPHENOUS VEIN HARVESTED ENDOSCOPICALLY (N/A) TRANSESOPHAGEAL ECHOCARDIOGRAM (TEE) (N/A) today. The various methods of treatment have been discussed with the patient. After consideration of the risks, benefits and treatment options the patient has consented to the planned procedure.   The patient has been seen and labs reviewed. There are no changes in the patient's condition to prevent proceeding with the planned procedure today.  Recent labs:  Lab Results  Component Value Date   WBC 9.1 10/10/2018   HGB 10.3 (L) 10/10/2018   HCT 31.7 (L) 10/10/2018   PLT 235 10/10/2018   GLUCOSE 115 (H) 10/10/2018   ALT 22 10/08/2018   AST 21 10/08/2018   NA 138 10/10/2018   K 4.0 10/10/2018   CL 96 (L) 10/10/2018   CREATININE 1.23 10/10/2018   BUN 34 (H) 10/10/2018   CO2 31 10/10/2018   INR 1.6 (H) 10/04/2018   HGBA1C 6.2 (H) 10/01/2018    Len Childs, MD 10/10/2018 6:22 AM

## 2018-10-11 ENCOUNTER — Inpatient Hospital Stay (HOSPITAL_COMMUNITY): Payer: Medicare Other

## 2018-10-11 LAB — CBC
HCT: 33.8 % — ABNORMAL LOW (ref 39.0–52.0)
Hemoglobin: 11.1 g/dL — ABNORMAL LOW (ref 13.0–17.0)
MCH: 32 pg (ref 26.0–34.0)
MCHC: 32.8 g/dL (ref 30.0–36.0)
MCV: 97.4 fL (ref 80.0–100.0)
Platelets: 284 10*3/uL (ref 150–400)
RBC: 3.47 MIL/uL — ABNORMAL LOW (ref 4.22–5.81)
RDW: 13.5 % (ref 11.5–15.5)
WBC: 10 10*3/uL (ref 4.0–10.5)
nRBC: 0 % (ref 0.0–0.2)

## 2018-10-11 LAB — BASIC METABOLIC PANEL
Anion gap: 13 (ref 5–15)
BUN: 32 mg/dL — ABNORMAL HIGH (ref 8–23)
CO2: 30 mmol/L (ref 22–32)
Calcium: 9 mg/dL (ref 8.9–10.3)
Chloride: 95 mmol/L — ABNORMAL LOW (ref 98–111)
Creatinine, Ser: 1.02 mg/dL (ref 0.61–1.24)
GFR calc Af Amer: >60 mL/min (ref >60)
GFR calc non Af Amer: >60 mL/min (ref >60)
Glucose, Bld: 116 mg/dL — ABNORMAL HIGH (ref 70–99)
Potassium: 3.7 mmol/L (ref 3.5–5.1)
Sodium: 138 mmol/L (ref 135–145)

## 2018-10-11 LAB — GLUCOSE, CAPILLARY
Glucose-Capillary: 98 mg/dL (ref 70–99)
Glucose-Capillary: 110 mg/dL — ABNORMAL HIGH (ref 70–99)
Glucose-Capillary: 134 mg/dL — ABNORMAL HIGH (ref 70–99)
Glucose-Capillary: 103 mg/dL — ABNORMAL HIGH (ref 70–99)

## 2018-10-11 MED ORDER — ENSURE ENLIVE PO LIQD
237.0000 mL | Freq: Two times a day (BID) | ORAL | Status: DC
  Administered 2018-10-11 – 2018-10-21 (×14): 237 mL via ORAL

## 2018-10-11 MED ORDER — POTASSIUM CHLORIDE CRYS ER 20 MEQ PO TBCR
20.0000 meq | EXTENDED_RELEASE_TABLET | Freq: Two times a day (BID) | ORAL | Status: DC
  Administered 2018-10-11 – 2018-10-15 (×9): 20 meq via ORAL
  Filled 2018-10-11 (×9): qty 1

## 2018-10-11 MED ORDER — POTASSIUM CHLORIDE CRYS ER 20 MEQ PO TBCR
20.0000 meq | EXTENDED_RELEASE_TABLET | ORAL | Status: DC
  Administered 2018-10-11: 20 meq via ORAL
  Filled 2018-10-11: qty 1

## 2018-10-11 NOTE — Progress Notes (Addendum)
T[facility] DAILY ICU PROGRESS NOTE                   [address]            [place] [address]          [phone]   7 Days Post-Op Procedure(s) (LRB): REDO CORONARY ARTERY BYPASS GRAFTING (CABG) x 1, SVG TO LAD, USING LEFT GREATER SAPHENOUS VEIN HARVESTED ENDOSCOPICALLY (N/A) TRANSESOPHAGEAL ECHOCARDIOGRAM (TEE) (N/A)  Total Length of Stay:  LOS: 7 days   Subjective:  Events of last night noted.  Patient complains of being more swollen this morning.    Objective: Vital signs in last 24 hours: Temp:  [97.8 F (36.6 C)-98.5 F (36.9 C)] 98.2 F (36.8 C) (07/02 1130) Pulse Rate:  [73-98] 75 (07/02 1100) Cardiac Rhythm: Normal sinus rhythm (07/02 0800) Resp:  [10-19] 11 (07/02 1100) BP: (89-166)/(55-130) 129/74 (07/02 1100) SpO2:  [90 %-100 %] 100 % (07/02 1100) Weight:  [95.4 kg] 95.4 kg (07/02 0500)  Filed Weights   10/09/18 0500 10/10/18 0500 10/11/18 0500  Weight: 96.9 kg 93.5 kg 95.4 kg    Weight change: 1.9 kg   Intake/Output from previous day: 07/01 0701 - 07/02 0700 In: 800 [P.O.:800] Out: 1910 [Urine:1875; Chest Tube:35]  Intake/Output this shift: Total I/O In: 240 [P.O.:240] Out: -   Current Meds: Scheduled Meds:  albuterol  2.5 mg Inhalation TID   aspirin EC  325 mg Oral Daily   Or   aspirin  324 mg Per Tube Daily   azithromycin  250 mg Oral Q M,W,F   bisacodyl  10 mg Oral Daily   Or   bisacodyl  10 mg Rectal Daily   budesonide-formoterol  2 puff Inhalation BID   Chlorhexidine Gluconate Cloth  6 each Topical Daily   docusate sodium  200 mg Oral Daily   enoxaparin (LOVENOX) injection  40 mg Subcutaneous Q24H   feeding supplement (ENSURE ENLIVE)  237 mL Oral BID BM   fluticasone  1 spray Each Nare Daily   furosemide  40 mg Oral Daily   guaiFENesin  600 mg Oral BID   insulin aspart  0-15 Units Subcutaneous TID WC   loratadine  10 mg Oral Daily   losartan  25 mg Oral Daily   metFORMIN  500 mg Oral BID WC    metoprolol tartrate  12.5 mg Oral BID   Or   metoprolol tartrate  12.5 mg Per Tube BID   montelukast  10 mg Oral QPM   pantoprazole  40 mg Oral Daily   potassium chloride  20 mEq Oral BID   predniSONE  20 mg Oral Q breakfast   rosuvastatin  40 mg Oral Daily   sodium chloride flush  3 mL Intravenous Q12H   Tiotropium Bromide Monohydrate  5 mcg Inhalation Daily   valACYclovir  500 mg Oral QPM   Continuous Infusions: PRN Meds:.albuterol, ALPRAZolam, metoprolol tartrate, morphine injection, ondansetron (ZOFRAN) IV, oxyCODONE, sodium chloride flush, traMADol, white petrolatum  General appearance: cooperative and mild distress Heart: regular rate and rhythm Lungs: wheezes bilaterally Abdomen: soft, non-tender; bowel sounds normal; no masses,  no organomegaly Extremities: extensive sub q air along left and right chest, face Wound: clean and dry  Lab Results: CBC: Recent Labs    10/10/18 0228 10/11/18 0404  WBC 9.1 10.0  HGB 10.3* 11.1*  HCT 31.7* 33.8*  PLT 235 284   BMET:  Recent Labs    10/10/18 0228 10/11/18 0404  NA 138 138  K 4.0 3.7  CL 96* 95*  CO2 31 30  GLUCOSE 115* 116*  BUN 34* 32*  CREATININE 1.23 1.02  CALCIUM 8.7* 9.0    CMET: Lab Results  Component Value Date   WBC 10.0 10/11/2018   HGB 11.1 (L) 10/11/2018   HCT 33.8 (L) 10/11/2018   PLT 284 10/11/2018   GLUCOSE 116 (H) 10/11/2018   ALT 22 10/08/2018   AST 21 10/08/2018   NA 138 10/11/2018   K 3.7 10/11/2018   CL 95 (L) 10/11/2018   CREATININE 1.02 10/11/2018   BUN 32 (H) 10/11/2018   CO2 30 10/11/2018   INR 1.6 (H) 10/04/2018   HGBA1C 6.2 (H) 10/01/2018      PT/INR: No results for input(s): LABPROT, INR in the last 72 hours. Radiology: Dg Chest Port 1 View  Result Date: 10/11/2018 CLINICAL DATA:  68 y/o M; no pneumothorax. Increased swelling and shortness of breath. EXAM: PORTABLE CHEST 1 VIEW COMPARISON:  10/10/2018 chest radiograph FINDINGS: Stable cardiac silhouette post  median sternotomy. Stable pneumomediastinum. Diffuse subcutaneous pneumatosis partially obscures the lungs. Right-sided pneumothorax appears grossly stable from the prior chest radiograph. Left-sided chest tube is stable in position. The left-sided pneumothorax appears mildly increased in size at the left costal diaphragmatic angle. No midline shift of mediastinum. No acute osseous abnormality is evident. IMPRESSION: 1. Extensive subcutaneous emphysema partially obscures the lungs. 2. Stable pneumoperitoneum and grossly stable small right pneumothorax. 3. Stable left-sided chest tube. Left pneumothorax appears mildly increased at the left costal diaphragmatic angle. Electronically Signed   By: Kristine Garbe M.D.   On: 10/11/2018 02:31     Assessment/Plan: S/P Procedure(s) (LRB): REDO CORONARY ARTERY BYPASS GRAFTING (CABG) x 1, SVG TO LAD, USING LEFT GREATER SAPHENOUS VEIN HARVESTED ENDOSCOPICALLY (N/A) TRANSESOPHAGEAL ECHOCARDIOGRAM (TEE) (N/A)  1. CV- hemodynamically stable in NSR, on Lopressor, Cozaar 2. Pulm- worsening of sub q air, now with extension into his right arm, CXR with increase in sub q air, basilar left pneumothorax is mildly increased, pneuomperitoneum in stable, right pneumothorax stable... CT with minimal air leak on suction 3. Renal- creatinine WNL, K is WNL, continue lasix, will restart low dose potassium supplementation 4. Dm- cbgs controlled 5. Dispo- patient again with extension of sub q air now into right arm, left sided chest tube in place with minor air leak, CXR shows mild increase in basilar pneumothorax on left, restart low dose K supplement as he is on Lasix, DM controlled, continue current care  Ellwood Handler, PA-C  10/11/2018 1:06 PM  patient examined and medical record reviewed,agree with above note. [name] 10/11/2018

## 2018-10-11 NOTE — Progress Notes (Addendum)
Pt c/o increased SOB at rest, applied [place] 2L and put in high fowlers position with no improvement, sats 95 on RA, 99 on 2L. Lung sounds are rhonchus and diminished. There was also an increase in SQ air noted on his R arm and chest. AM CXR changed to STAT. PRN albuterol inhaler given. Pt reports slight improvement in symptoms but still has feelings of SOB. Paged Dr. Orvan Seen regarding the above, orders to give PRN xanax and page back if no improvement.

## 2018-10-11 NOTE — Progress Notes (Signed)
     [address]       [place] [address]             [phone]       In good spirits.   Significant subQ emphysema  Vitals:   10/11/18 1700 10/11/18 1800  BP: (!) 144/94 (!) 154/71  Pulse: 97 89  Resp: 19 (!) 21  Temp:    SpO2: 100% 100%   CBC    Component Value Date/Time   WBC 10.0 10/11/2018 0404   RBC 3.47 (L) 10/11/2018 0404   HGB 11.1 (L) 10/11/2018 0404   HCT 33.8 (L) 10/11/2018 0404   PLT 284 10/11/2018 0404   MCV 97.4 10/11/2018 0404   MCH 32.0 10/11/2018 0404   MCHC 32.8 10/11/2018 0404   RDW 13.5 10/11/2018 0404   BMP Latest Ref Rng & Units 10/11/2018 10/10/2018 10/09/2018  Glucose 70 - 99 mg/dL 116(H) 115(H) 131(H)  BUN 8 - 23 mg/dL 32(H) 34(H) 34(H)  Creatinine 0.61 - 1.24 mg/dL 1.02 1.23 1.23  Sodium 135 - 145 mmol/L 138 138 137  Potassium 3.5 - 5.1 mmol/L 3.7 4.0 4.8  Chloride 98 - 111 mmol/L 95(L) 96(L) 95(L)  CO2 22 - 32 mmol/L 30 31 30   Calcium 8.9 - 10.3 mg/dL 9.0 8.7(L) 8.6(L)

## 2018-10-12 ENCOUNTER — Inpatient Hospital Stay (HOSPITAL_COMMUNITY): Payer: Medicare Other

## 2018-10-12 LAB — BASIC METABOLIC PANEL
Anion gap: 12 (ref 5–15)
BUN: 32 mg/dL — ABNORMAL HIGH (ref 8–23)
CO2: 31 mmol/L (ref 22–32)
Calcium: 9.1 mg/dL (ref 8.9–10.3)
Chloride: 95 mmol/L — ABNORMAL LOW (ref 98–111)
Creatinine, Ser: 1.04 mg/dL (ref 0.61–1.24)
GFR calc Af Amer: >60 mL/min (ref >60)
GFR calc non Af Amer: >60 mL/min (ref >60)
Glucose, Bld: 115 mg/dL — ABNORMAL HIGH (ref 70–99)
Potassium: 4.2 mmol/L (ref 3.5–5.1)
Sodium: 138 mmol/L (ref 135–145)

## 2018-10-12 LAB — CBC
HCT: 33.2 % — ABNORMAL LOW (ref 39.0–52.0)
Hemoglobin: 11.1 g/dL — ABNORMAL LOW (ref 13.0–17.0)
MCH: 32.6 pg (ref 26.0–34.0)
MCHC: 33.4 g/dL (ref 30.0–36.0)
MCV: 97.4 fL (ref 80.0–100.0)
Platelets: 323 10*3/uL (ref 150–400)
RBC: 3.41 MIL/uL — ABNORMAL LOW (ref 4.22–5.81)
RDW: 13.5 % (ref 11.5–15.5)
WBC: 10.7 10*3/uL — ABNORMAL HIGH (ref 4.0–10.5)
nRBC: 0 % (ref 0.0–0.2)

## 2018-10-12 LAB — GLUCOSE, CAPILLARY
Glucose-Capillary: 112 mg/dL — ABNORMAL HIGH (ref 70–99)
Glucose-Capillary: 123 mg/dL — ABNORMAL HIGH (ref 70–99)
Glucose-Capillary: 173 mg/dL — ABNORMAL HIGH (ref 70–99)

## 2018-10-12 NOTE — Progress Notes (Signed)
      [address]       [place] [address]             [phone]      POD # 8 redo CABG  Stable day   No new issues  Remo Lipps C. Roxan Hockey, MD [facility] [phone]

## 2018-10-12 NOTE — Progress Notes (Addendum)
TCTS DAILY ICU PROGRESS NOTE                   [address]            [place] [address]          [phone]   8 Days Post-Op Procedure(s) (LRB): REDO CORONARY ARTERY BYPASS GRAFTING (CABG) x 1, SVG TO LAD, USING LEFT GREATER SAPHENOUS VEIN HARVESTED ENDOSCOPICALLY (N/A) TRANSESOPHAGEAL ECHOCARDIOGRAM (TEE) (N/A)  Total Length of Stay:  LOS: 8 days   Subjective:  No new complaints.  Up in chair, states his eyes decreased in swelling that he could a little last night, but they have swell back up again.  Objective: Vital signs in last 24 hours: Temp:  [97.5 F (36.4 C)-98.2 F (36.8 C)] 97.5 F (36.4 C) (07/03 0700) Pulse Rate:  [70-97] 87 (07/03 0700) Cardiac Rhythm: Normal sinus rhythm (07/03 0000) Resp:  [11-21] 17 (07/03 0700) BP: (112-165)/(68-130) 165/84 (07/03 0700) SpO2:  [91 %-100 %] 100 % (07/03 0700) Weight:  [95.4 kg] 95.4 kg (07/03 0500)  Filed Weights   10/10/18 0500 10/11/18 0500 10/12/18 0500  Weight: 93.5 kg 95.4 kg 95.4 kg    Weight change: 0 kg   Intake/Output from previous day: 07/02 0701 - 07/03 0700 In: 600 [P.O.:600] Out: 1480 [Urine:1450; Chest Tube:30]  Current Meds: Scheduled Meds: . albuterol  2.5 mg Inhalation TID  . aspirin EC  325 mg Oral Daily   Or  . aspirin  324 mg Per Tube Daily  . azithromycin  250 mg Oral Q M,W,F  . bisacodyl  10 mg Oral Daily   Or  . bisacodyl  10 mg Rectal Daily  . budesonide-formoterol  2 puff Inhalation BID  . Chlorhexidine Gluconate Cloth  6 each Topical Daily  . docusate sodium  200 mg Oral Daily  . enoxaparin (LOVENOX) injection  40 mg Subcutaneous Q24H  . feeding supplement (ENSURE ENLIVE)  237 mL Oral BID BM  . fluticasone  1 spray Each Nare Daily  . furosemide  40 mg Oral Daily  . guaiFENesin  600 mg Oral BID  . insulin aspart  0-15 Units Subcutaneous TID WC  . loratadine  10 mg Oral Daily  . losartan  25 mg Oral Daily  . metFORMIN  500 mg Oral BID WC  . metoprolol tartrate   12.5 mg Oral BID   Or  . metoprolol tartrate  12.5 mg Per Tube BID  . montelukast  10 mg Oral QPM  . pantoprazole  40 mg Oral Daily  . potassium chloride  20 mEq Oral BID  . predniSONE  20 mg Oral Q breakfast  . rosuvastatin  40 mg Oral Daily  . sodium chloride flush  3 mL Intravenous Q12H  . Tiotropium Bromide Monohydrate  5 mcg Inhalation Daily  . valACYclovir  500 mg Oral QPM   Continuous Infusions: PRN Meds:.albuterol, ALPRAZolam, metoprolol tartrate, morphine injection, ondansetron (ZOFRAN) IV, oxyCODONE, sodium chloride flush, traMADol, white petrolatum  General appearance: alert, cooperative and no distress Heart: regular rate and rhythm Lungs: diminished breath sounds bilaterally Abdomen: soft, non-tender; bowel sounds normal; no masses,  no organomegaly Extremities: edema trace Wound: clean and dry  Lab Results: CBC: Recent Labs    10/11/18 0404 10/12/18 0207  WBC 10.0 10.7*  HGB 11.1* 11.1*  HCT 33.8* 33.2*  PLT 284 323   BMET:  Recent Labs    10/11/18 0404 10/12/18 0207  NA 138 138  K 3.7  4.2  CL 95* 95*  CO2 30 31  GLUCOSE 116* 115*  BUN 32* 32*  CREATININE 1.02 1.04  CALCIUM 9.0 9.1    CMET: Lab Results  Component Value Date   WBC 10.7 (H) 10/12/2018   HGB 11.1 (L) 10/12/2018   HCT 33.2 (L) 10/12/2018   PLT 323 10/12/2018   GLUCOSE 115 (H) 10/12/2018   ALT 22 10/08/2018   AST 21 10/08/2018   NA 138 10/12/2018   K 4.2 10/12/2018   CL 95 (L) 10/12/2018   CREATININE 1.04 10/12/2018   BUN 32 (H) 10/12/2018   CO2 31 10/12/2018   INR 1.6 (H) 10/04/2018   HGBA1C 6.2 (H) 10/01/2018      PT/INR: No results for input(s): LABPROT, INR in the last 72 hours. Radiology: No results found.   Assessment/Plan: S/P Procedure(s) (LRB): REDO CORONARY ARTERY BYPASS GRAFTING (CABG) x 1, SVG TO LAD, USING LEFT GREATER SAPHENOUS VEIN HARVESTED ENDOSCOPICALLY (N/A) TRANSESOPHAGEAL ECHOCARDIOGRAM (TEE) (N/A)  1. CV- hemodynamically stable on Cozaar,  Lopressor 2. Pulm- Extensive sub q air persists bilaterally with extension into face, both orbits, and now with extension down entire right arm.. CXR without significant change.. CT in place on suction, no air leak present 3. Renal- creatinine stable, weight is stable, continue diuretics 4. DM- cbgs remain controlled,  5. Dispo- patient stable in NSR, extensive sub q air persists, CT on suction without air leak... CXR remains relatively stable, continue current care     Erin Barrett 10/12/2018 8:10 AM   Subcutaneous emphysema unchanged-otherwise patient stable.  No pneumothorax on chest x-ray.  patient examined and medical record reviewed,agree with above note. [name] 10/12/2018

## 2018-10-13 ENCOUNTER — Inpatient Hospital Stay (HOSPITAL_COMMUNITY): Payer: Medicare Other

## 2018-10-13 LAB — GLUCOSE, CAPILLARY
Glucose-Capillary: 119 mg/dL — ABNORMAL HIGH (ref 70–99)
Glucose-Capillary: 139 mg/dL — ABNORMAL HIGH (ref 70–99)
Glucose-Capillary: 112 mg/dL — ABNORMAL HIGH (ref 70–99)

## 2018-10-13 NOTE — Progress Notes (Signed)
9 Days Post-Op Procedure(s) (LRB): REDO CORONARY ARTERY BYPASS GRAFTING (CABG) x 1, SVG TO LAD, USING LEFT GREATER SAPHENOUS VEIN HARVESTED ENDOSCOPICALLY (N/A) TRANSESOPHAGEAL ECHOCARDIOGRAM (TEE) (N/A) Subjective: Up in chair SQ emphysema a little worse in face today  Objective: Vital signs in last 24 hours: Temp:  [97.5 F (36.4 C)-98.5 F (36.9 C)] 98.5 F (36.9 C) (07/04 0802) Pulse Rate:  [71-95] 94 (07/04 0700) Cardiac Rhythm: Normal sinus rhythm (07/03 2000) Resp:  [7-26] 20 (07/04 0700) BP: (127-162)/(64-131) 142/131 (07/04 0700) SpO2:  [88 %-100 %] 99 % (07/04 0736) Weight:  [95.4 kg] 95.4 kg (07/04 0700)  Hemodynamic parameters for last 24 hours:    Intake/Output from previous day: 07/03 0701 - 07/04 0700 In: 1320 [P.O.:1320] Out: 630 [Urine:560; Chest Tube:70] Intake/Output this shift: No intake/output data recorded.  General appearance: alert, cooperative and no distress Neurologic: intact Heart: regular rate and rhythm Lungs: diminished breath sounds bibasilar extensive SQ emphysema  Lab Results: Recent Labs    10/11/18 0404 10/12/18 0207  WBC 10.0 10.7*  HGB 11.1* 11.1*  HCT 33.8* 33.2*  PLT 284 323   BMET:  Recent Labs    10/11/18 0404 10/12/18 0207  NA 138 138  K 3.7 4.2  CL 95* 95*  CO2 30 31  GLUCOSE 116* 115*  BUN 32* 32*  CREATININE 1.02 1.04  CALCIUM 9.0 9.1    PT/INR: No results for input(s): LABPROT, INR in the last 72 hours. ABG    Component Value Date/Time   PHART 7.376 10/05/2018 1043   HCO3 24.2 10/05/2018 1043   TCO2 24 10/04/2018 1949   ACIDBASEDEF 0.3 10/05/2018 1043   O2SAT 97.9 10/05/2018 1043   CBG (last 3)  Recent Labs    10/12/18 0650 10/12/18 1144 10/12/18 1602  GLUCAP 112* 123* 173*    Assessment/Plan: S/P Procedure(s) (LRB): REDO CORONARY ARTERY BYPASS GRAFTING (CABG) x 1, SVG TO LAD, USING LEFT GREATER SAPHENOUS VEIN HARVESTED ENDOSCOPICALLY (N/A) TRANSESOPHAGEAL ECHOCARDIOGRAM (TEE) (N/A) -CV-  stable in SR RESP- extensive SQ emphysema- was improving yesterday until CT briefly off suction and worsened in face again  Keep CT to suction RENAL- creatinine and lytes OK ENDO- CBG reasonably well controlled   LOS: 9 days    Melrose Nakayama 10/13/2018

## 2018-10-14 ENCOUNTER — Inpatient Hospital Stay (HOSPITAL_COMMUNITY): Payer: Medicare Other

## 2018-10-14 LAB — BASIC METABOLIC PANEL
Anion gap: 9 (ref 5–15)
BUN: 31 mg/dL — ABNORMAL HIGH (ref 8–23)
CO2: 31 mmol/L (ref 22–32)
Calcium: 8.9 mg/dL (ref 8.9–10.3)
Chloride: 96 mmol/L — ABNORMAL LOW (ref 98–111)
Creatinine, Ser: 1.03 mg/dL (ref 0.61–1.24)
GFR calc Af Amer: >60 mL/min (ref >60)
GFR calc non Af Amer: >60 mL/min (ref >60)
Glucose, Bld: 107 mg/dL — ABNORMAL HIGH (ref 70–99)
Potassium: 4.5 mmol/L (ref 3.5–5.1)
Sodium: 136 mmol/L (ref 135–145)

## 2018-10-14 LAB — GLUCOSE, CAPILLARY
Glucose-Capillary: 101 mg/dL — ABNORMAL HIGH (ref 70–99)
Glucose-Capillary: 109 mg/dL — ABNORMAL HIGH (ref 70–99)
Glucose-Capillary: 142 mg/dL — ABNORMAL HIGH (ref 70–99)
Glucose-Capillary: 111 mg/dL — ABNORMAL HIGH (ref 70–99)

## 2018-10-14 NOTE — Progress Notes (Signed)
      [address]       [place] [address]             [phone]      SQ emphysema unchanged  BP 123/70   Pulse 94   Temp 97.8 F (36.6 C) (Oral)   Resp 12   Ht 5\' 9"  (1.753 m)   Wt 96.8 kg   SpO2 100%   BMI 31.51 kg/m   Intake/Output Summary (Last 24 hours) at 10/14/2018 1835 Last data filed at 10/14/2018 1546 Gross per 24 hour  Intake 1740 ml  Output 1578 ml  Net 162 ml   Remo Lipps C. Roxan Hockey, MD [facility] [phone]

## 2018-10-14 NOTE — Progress Notes (Signed)
10 Days Post-Op Procedure(s) (LRB): REDO CORONARY ARTERY BYPASS GRAFTING (CABG) x 1, SVG TO LAD, USING LEFT GREATER SAPHENOUS VEIN HARVESTED ENDOSCOPICALLY (N/A) TRANSESOPHAGEAL ECHOCARDIOGRAM (TEE) (N/A) Subjective: Up in chair. Denies pain, shortness of breath  Objective: Vital signs in last 24 hours: Temp:  [97.5 F (36.4 C)-98.1 F (36.7 C)] 97.7 F (36.5 C) (07/05 0814) Pulse Rate:  [64-104] 70 (07/05 0600) Cardiac Rhythm: Normal sinus rhythm (07/05 0400) Resp:  [8-21] 8 (07/05 0600) BP: (104-137)/(62-99) 137/77 (07/05 0600) SpO2:  [95 %-100 %] 98 % (07/05 0725) Weight:  [96.8 kg] 96.8 kg (07/05 0600)  Hemodynamic parameters for last 24 hours:    Intake/Output from previous day: 07/04 0701 - 07/05 0700 In: 2340 [P.O.:2100; I.V.:240] Out: 1618 [Urine:1550; Chest Tube:68] Intake/Output this shift: No intake/output data recorded.  General appearance: alert, cooperative and no distress Neurologic: intact Heart: regular rate and rhythm Lungs: rhonchi bilaterally extensive SQ emphysema  Lab Results: Recent Labs    10/12/18 0207  WBC 10.7*  HGB 11.1*  HCT 33.2*  PLT 323   BMET:  Recent Labs    10/12/18 0207 10/14/18 0324  NA 138 136  K 4.2 4.5  CL 95* 96*  CO2 31 31  GLUCOSE 115* 107*  BUN 32* 31*  CREATININE 1.04 1.03  CALCIUM 9.1 8.9    PT/INR: No results for input(s): LABPROT, INR in the last 72 hours. ABG    Component Value Date/Time   PHART 7.376 10/05/2018 1043   HCO3 24.2 10/05/2018 1043   TCO2 24 10/04/2018 1949   ACIDBASEDEF 0.3 10/05/2018 1043   O2SAT 97.9 10/05/2018 1043   CBG (last 3)  Recent Labs    10/13/18 1549 10/13/18 2123 10/14/18 0648  GLUCAP 139* 112* 101*    Assessment/Plan: S/P Procedure(s) (LRB): REDO CORONARY ARTERY BYPASS GRAFTING (CABG) x 1, SVG TO LAD, USING LEFT GREATER SAPHENOUS VEIN HARVESTED ENDOSCOPICALLY (N/A) TRANSESOPHAGEAL ECHOCARDIOGRAM (TEE) (N/A) CV- stable RESP- some rhonchi currently- on albuterol  and Symbicort  SQ emphysema persists  CT on suction- no air leak at present RENAL-- creatinine stable, lytes Ok ENDO- CBG well controlled Continue cardiac rehab   LOS: 10 days    Melrose Nakayama 10/14/2018

## 2018-10-15 ENCOUNTER — Inpatient Hospital Stay (HOSPITAL_COMMUNITY): Payer: Medicare Other

## 2018-10-15 ENCOUNTER — Inpatient Hospital Stay (HOSPITAL_COMMUNITY): Payer: Medicare Other | Admitting: Certified Registered Nurse Anesthetist

## 2018-10-15 ENCOUNTER — Encounter (HOSPITAL_COMMUNITY)
Admission: RE | Disposition: A | Discharge status: Home / Self Care | Payer: Self-pay | Source: Home / Self Care | Attending: Cardiothoracic Surgery

## 2018-10-15 ENCOUNTER — Encounter (HOSPITAL_COMMUNITY): Payer: Self-pay

## 2018-10-15 HISTORY — PX: CHEST TUBE INSERTION: SHX231

## 2018-10-15 LAB — GLUCOSE, CAPILLARY
Glucose-Capillary: 120 mg/dL — ABNORMAL HIGH (ref 70–99)
Glucose-Capillary: 108 mg/dL — ABNORMAL HIGH (ref 70–99)
Glucose-Capillary: 108 mg/dL — ABNORMAL HIGH (ref 70–99)
Glucose-Capillary: 121 mg/dL — ABNORMAL HIGH (ref 70–99)
Glucose-Capillary: 112 mg/dL — ABNORMAL HIGH (ref 70–99)

## 2018-10-15 SURGERY — CHEST TUBE INSERTION
Anesthesia: Monitor Anesthesia Care | Laterality: Left

## 2018-10-15 MED ORDER — PHENYLEPHRINE 40 MCG/ML (10ML) SYRINGE FOR IV PUSH (FOR BLOOD PRESSURE SUPPORT)
PREFILLED_SYRINGE | INTRAVENOUS | Status: AC
  Filled 2018-10-15: qty 10

## 2018-10-15 MED ORDER — FENTANYL CITRATE (PF) 100 MCG/2ML IJ SOLN
INTRAMUSCULAR | Status: DC | PRN
  Administered 2018-10-15 (×2): 25 ug via INTRAVENOUS

## 2018-10-15 MED ORDER — MIDAZOLAM HCL 2 MG/2ML IJ SOLN
INTRAMUSCULAR | Status: AC
  Filled 2018-10-15: qty 2

## 2018-10-15 MED ORDER — LACTATED RINGERS IV SOLN
INTRAVENOUS | Status: DC
  Administered 2018-10-15 – 2018-10-26 (×2): via INTRAVENOUS

## 2018-10-15 MED ORDER — METOPROLOL TARTRATE 25 MG/10 ML ORAL SUSPENSION
25.0000 mg | Freq: Two times a day (BID) | ORAL | Status: DC
  Filled 2018-10-15 (×5): qty 10

## 2018-10-15 MED ORDER — SUCCINYLCHOLINE CHLORIDE 200 MG/10ML IV SOSY
PREFILLED_SYRINGE | INTRAVENOUS | Status: AC
  Filled 2018-10-15: qty 10

## 2018-10-15 MED ORDER — PROPOFOL 10 MG/ML IV BOLUS
INTRAVENOUS | Status: AC
  Filled 2018-10-15: qty 20

## 2018-10-15 MED ORDER — METOPROLOL TARTRATE 25 MG PO TABS
25.0000 mg | ORAL_TABLET | Freq: Two times a day (BID) | ORAL | Status: DC
  Administered 2018-10-15 – 2018-11-01 (×31): 25 mg via ORAL
  Filled 2018-10-15 (×35): qty 1

## 2018-10-15 MED ORDER — ALBUTEROL SULFATE (2.5 MG/3ML) 0.083% IN NEBU
2.5000 mg | INHALATION_SOLUTION | Freq: Once | RESPIRATORY_TRACT | Status: AC
  Administered 2018-10-15: 2.5 mg via RESPIRATORY_TRACT

## 2018-10-15 MED ORDER — ADULT MULTIVITAMIN W/MINERALS CH
1.0000 | ORAL_TABLET | Freq: Every day | ORAL | Status: DC
  Administered 2018-10-16 – 2018-11-01 (×17): 1 via ORAL
  Filled 2018-10-15 (×17): qty 1

## 2018-10-15 MED ORDER — DEXAMETHASONE SODIUM PHOSPHATE 10 MG/ML IJ SOLN
INTRAMUSCULAR | Status: AC
  Filled 2018-10-15: qty 2

## 2018-10-15 MED ORDER — ONDANSETRON HCL 4 MG/2ML IJ SOLN
INTRAMUSCULAR | Status: AC
  Filled 2018-10-15: qty 2

## 2018-10-15 MED ORDER — LIDOCAINE HCL (PF) 0.5 % IJ SOLN
INTRAMUSCULAR | Status: AC
  Filled 2018-10-15: qty 50

## 2018-10-15 MED ORDER — LIDOCAINE 2% (20 MG/ML) 5 ML SYRINGE
INTRAMUSCULAR | Status: AC
  Filled 2018-10-15: qty 5

## 2018-10-15 MED ORDER — MIDAZOLAM HCL 5 MG/5ML IJ SOLN
INTRAMUSCULAR | Status: DC | PRN
  Administered 2018-10-15: 1 mg via INTRAVENOUS

## 2018-10-15 MED ORDER — LIDOCAINE HCL 0.5 % IJ SOLN
INTRAMUSCULAR | Status: DC | PRN
  Administered 2018-10-15: 32 mL

## 2018-10-15 MED ORDER — ROCURONIUM BROMIDE 10 MG/ML (PF) SYRINGE
PREFILLED_SYRINGE | INTRAVENOUS | Status: AC
  Filled 2018-10-15: qty 10

## 2018-10-15 MED ORDER — FENTANYL CITRATE (PF) 250 MCG/5ML IJ SOLN
INTRAMUSCULAR | Status: AC
  Filled 2018-10-15: qty 5

## 2018-10-15 MED ORDER — CEFAZOLIN SODIUM-DEXTROSE 2-3 GM-%(50ML) IV SOLR
INTRAVENOUS | Status: DC | PRN
  Administered 2018-10-15: 2 g via INTRAVENOUS

## 2018-10-15 MED ORDER — EPHEDRINE 5 MG/ML INJ
INTRAVENOUS | Status: AC
  Filled 2018-10-15: qty 10

## 2018-10-15 SURGICAL SUPPLY — 29 items
CANISTER SUCT 3000ML PPV (MISCELLANEOUS) ×3 IMPLANT
CATH THORACIC 28FR (CATHETERS) ×4 IMPLANT
CATH THORACIC 36FR (CATHETERS) IMPLANT
CATH TROCAR 20FR (CATHETERS) IMPLANT
CATH TROCAR 28FR (CATHETERS) IMPLANT
COVER BACK TABLE 60X90IN (DRAPES) ×3 IMPLANT
COVER SURGICAL LIGHT HANDLE (MISCELLANEOUS) ×3 IMPLANT
COVER WAND RF STERILE (DRAPES) ×1 IMPLANT
DRAPE CHEST BREAST 15X10 FENES (DRAPES) ×3 IMPLANT
GAUZE 4X4 16PLY RFD (DISPOSABLE) ×3 IMPLANT
GAUZE SPONGE 4X4 12PLY STRL (GAUZE/BANDAGES/DRESSINGS) ×3 IMPLANT
GAUZE SPONGE 4X4 12PLY STRL LF (GAUZE/BANDAGES/DRESSINGS) ×2 IMPLANT
GLOVE BIO SURGEON STRL SZ7.5 (GLOVE) ×3 IMPLANT
GOWN STRL REUS W/ TWL LRG LVL3 (GOWN DISPOSABLE) ×2 IMPLANT
GOWN STRL REUS W/TWL LRG LVL3 (GOWN DISPOSABLE) ×6
KIT BASIN OR (CUSTOM PROCEDURE TRAY) ×3 IMPLANT
KIT TURNOVER KIT B (KITS) ×3 IMPLANT
PAD ARMBOARD 7.5X6 YLW CONV (MISCELLANEOUS) ×6 IMPLANT
SUT SILK  1 MH (SUTURE)
SUT SILK 1 MH (SUTURE) IMPLANT
SYR CONTROL 10ML LL (SYRINGE) ×1 IMPLANT
SYSTEM SAHARA CHEST DRAIN ATS (WOUND CARE) ×3 IMPLANT
TAPE CLOTH SURG 4X10 WHT LF (GAUZE/BANDAGES/DRESSINGS) ×2 IMPLANT
TOWEL GREEN STERILE (TOWEL DISPOSABLE) ×3 IMPLANT
TOWEL GREEN STERILE FF (TOWEL DISPOSABLE) ×3 IMPLANT
TRAP SPECIMEN MUCOUS 40CC (MISCELLANEOUS) ×1 IMPLANT
TRAY CHEST TUBE INSERTION DISP (SET/KITS/TRAYS/PACK) ×3 IMPLANT
TUBE CONNECTING 12'X1/4 (SUCTIONS) ×1
TUBE CONNECTING 12X1/4 (SUCTIONS) ×2 IMPLANT

## 2018-10-15 NOTE — Brief Op Note (Signed)
10/15/2018  10:59 AM  PATIENT:  Melvin Brewer  68 y.o. male  PRE-OPERATIVE DIAGNOSIS:  SUBCUTANEOUS EMPHYSEMA  POST-OPERATIVE DIAGNOSIS:  SUBCUTANEOUS EMPHYSEMA  PROCEDURE:  Procedure(s): INSERTION OF LEFT CHEST TUBE (Left)  SURGEON:  Surgeon(s) and Role:    Ivin Poot, MD - Primary  PHYSICIAN ASSISTANT:   ASSISTANTS: none  ANESTHESIA:   local and IV sedation  EBL:5 cc  BLOOD ADMINISTERED:none  DRAINS: 28 F chest tube Left pleural space   LOCAL MEDICATIONS USED:  LIDOCAINE 1 % 14 ml  SPECIMEN:  No Specimen  DISPOSITION OF SPECIMEN:  N/A  COUNTS:  YES  TOURNIQUET:   DICTATION: .Dragon Dictation  PLAN OF CARE: return to ICU  PATIENT DISPOSITION:  PACU - hemodynamically stable.   Delay start of Pharmacological VTE agent (>24hrs) due to surgical blood loss or risk of bleeding: yes

## 2018-10-15 NOTE — Progress Notes (Signed)
11 Days Post-Op Procedure(s) (LRB): REDO CORONARY ARTERY BYPASS GRAFTING (CABG) x 1, SVG TO LAD, USING LEFT GREATER SAPHENOUS VEIN HARVESTED ENDOSCOPICALLY (N/A) TRANSESOPHAGEAL ECHOCARDIOGRAM (TEE) (N/A) Subjective: Starting to recurtr L basilar pneumothorax, now small L chest tube probably occluded, no air leak- will do better with new tube Will -place in OR today MAR anesthesia Objective: Vital signs in last 24 hours: Temp:  [97 F (36.1 C)-98.3 F (36.8 C)] 97.9 F (36.6 C) (07/06 0739) Pulse Rate:  [73-133] 104 (07/06 0800) Cardiac Rhythm: Normal sinus rhythm (07/06 0400) Resp:  [10-17] 17 (07/06 0800) BP: (109-170)/(57-103) 170/93 (07/06 0800) SpO2:  [87 %-100 %] 96 % (07/06 0800) FiO2 (%):  [40 %] 40 % (07/05 1920) Weight:  [94.7 kg] 94.7 kg (07/06 0500)  Hemodynamic parameters for last 24 hours:  stable  Intake/Output from previous day: 07/05 0701 - 07/06 0700 In: 720 [P.O.:720] Out: 1050 [Urine:1050] Intake/Output this shift: Total I/O In: -  Out: 50 [Urine:50]  bilat pneumothorax No air leak from chest tube Lab Results: No results for input(s): WBC, HGB, HCT, PLT in the last 72 hours. BMET:  Recent Labs    10/14/18 0324  NA 136  K 4.5  CL 96*  CO2 31  GLUCOSE 107*  BUN 31*  CREATININE 1.03  CALCIUM 8.9    PT/INR: No results for input(s): LABPROT, INR in the last 72 hours. ABG    Component Value Date/Time   PHART 7.376 10/05/2018 1043   HCO3 24.2 10/05/2018 1043   TCO2 24 10/04/2018 1949   ACIDBASEDEF 0.3 10/05/2018 1043   O2SAT 97.9 10/05/2018 1043   CBG (last 3)  Recent Labs    10/14/18 1544 10/14/18 2132 10/15/18 0642  GLUCAP 142* 111* 120*    Assessment/Plan: S/P Procedure(s) (LRB): REDO CORONARY ARTERY BYPASS GRAFTING (CABG) x 1, SVG TO LAD, USING LEFT GREATER SAPHENOUS VEIN HARVESTED ENDOSCOPICALLY (N/A) TRANSESOPHAGEAL ECHOCARDIOGRAM (TEE) (N/A) OR this am for chest tube exchange With large amount of subQ air would be safer and  more comfortable for patient to perform in OR   LOS: 11 days    [name] 10/15/2018

## 2018-10-15 NOTE — Progress Notes (Signed)
[facility] DAILY ICU PROGRESS NOTE                   [address]            [place] [address]          [phone]   11 Days Post-Op Procedure(s) (LRB): REDO CORONARY ARTERY BYPASS GRAFTING (CABG) x 1, SVG TO LAD, USING LEFT GREATER SAPHENOUS VEIN HARVESTED ENDOSCOPICALLY (N/A) TRANSESOPHAGEAL ECHOCARDIOGRAM (TEE) (N/A)  Total Length of Stay:  LOS: 11 days   Subjective:  Patient states doing so/so.  States swelling has gotten worse with involvement in left arm.   Not ambulating much due to inability to see.  Objective: Vital signs in last 24 hours: Temp:  [97 F (36.1 C)-98.3 F (36.8 C)] 97.9 F (36.6 C) (07/06 0739) Pulse Rate:  [73-133] 96 (07/06 0749) Cardiac Rhythm: Normal sinus rhythm (07/06 0400) Resp:  [10-17] 17 (07/06 0749) BP: (109-152)/(57-103) 143/83 (07/06 0739) SpO2:  [87 %-100 %] 98 % (07/06 0739) FiO2 (%):  [40 %] 40 % (07/05 1920) Weight:  [94.7 kg] 94.7 kg (07/06 0500)  Filed Weights   10/13/18 0700 10/14/18 0600 10/15/18 0500  Weight: 95.4 kg 96.8 kg 94.7 kg    Weight change: -2.1 kg   Intake/Output from previous day: 07/05 0701 - 07/06 0700 In: 720 [P.O.:720] Out: 1050 [Urine:1050]  Current Meds: Scheduled Meds: . albuterol  2.5 mg Inhalation TID  . albuterol  2.5 mg Nebulization Once  . aspirin EC  325 mg Oral Daily   Or  . aspirin  324 mg Per Tube Daily  . azithromycin  250 mg Oral Q M,W,F  . bisacodyl  10 mg Oral Daily   Or  . bisacodyl  10 mg Rectal Daily  . budesonide-formoterol  2 puff Inhalation BID  . Chlorhexidine Gluconate Cloth  6 each Topical Daily  . docusate sodium  200 mg Oral Daily  . enoxaparin (LOVENOX) injection  40 mg Subcutaneous Q24H  . feeding supplement (ENSURE ENLIVE)  237 mL Oral BID BM  . fluticasone  1 spray Each Nare Daily  . furosemide  40 mg Oral Daily  . guaiFENesin  600 mg Oral BID  . insulin aspart  0-15 Units Subcutaneous TID WC  . loratadine  10 mg Oral Daily  . losartan  25 mg Oral  Daily  . metFORMIN  500 mg Oral BID WC  . metoprolol tartrate  12.5 mg Oral BID   Or  . metoprolol tartrate  12.5 mg Per Tube BID  . montelukast  10 mg Oral QPM  . pantoprazole  40 mg Oral Daily  . potassium chloride  20 mEq Oral BID  . rosuvastatin  40 mg Oral Daily  . sodium chloride flush  3 mL Intravenous Q12H  . Tiotropium Bromide Monohydrate  5 mcg Inhalation Daily  . valACYclovir  500 mg Oral QPM   Continuous Infusions: PRN Meds:.albuterol, ALPRAZolam, metoprolol tartrate, morphine injection, ondansetron (ZOFRAN) IV, oxyCODONE, sodium chloride flush, traMADol, white petrolatum  General appearance: alert, cooperative and mild distress... extensive sub q emphysema present. Heart: regular rate and rhythm Lungs: diminished breath sounds bibasilar Abdomen: soft, non-tender; bowel sounds normal; no masses,  no organomegaly Extremities: edema trace Wound: clean and dry  Lab Results: CBC:No results for input(s): WBC, HGB, HCT, PLT in the last 72 hours. BMET:  Recent Labs    10/14/18 0324  NA 136  K 4.5  CL 96*  CO2 31  GLUCOSE 107*  BUN 31*  CREATININE 1.03  CALCIUM 8.9    CMET: Lab Results  Component Value Date   WBC 10.7 (H) 10/12/2018   HGB 11.1 (L) 10/12/2018   HCT 33.2 (L) 10/12/2018   PLT 323 10/12/2018   GLUCOSE 107 (H) 10/14/2018   ALT 22 10/08/2018   AST 21 10/08/2018   NA 136 10/14/2018   K 4.5 10/14/2018   CL 96 (L) 10/14/2018   CREATININE 1.03 10/14/2018   BUN 31 (H) 10/14/2018   CO2 31 10/14/2018   INR 1.6 (H) 10/04/2018   HGBA1C 6.2 (H) 10/01/2018      PT/INR: No results for input(s): LABPROT, INR in the last 72 hours. Radiology: Dg Chest Port 1 View  Result Date: 10/15/2018 CLINICAL DATA:  CABG 10/04/2018 EXAM: PORTABLE CHEST 1 VIEW COMPARISON:  One-view chest x-ray 10/14/2018 FINDINGS: Left pneumothorax is increasing in size, now seen over the lower lobes. Left-sided chest tube is stable. Heart size is normal. Atherosclerotic calcifications  are again noted at the aorta. Extensive subcutaneous emphysema is again seen. IMPRESSION: 1. Left pneumothorax visualized over the lower lobes. 2. Left-sided chest tube stable in position. 3. Stable diffuse subcutaneous emphysema. Electronically Signed   By: San Morelle M.D.   On: 10/15/2018 06:15   Dg Chest Port 1 View  Result Date: 10/14/2018 CLINICAL DATA:  Status post bypass surgery. EXAM: PORTABLE CHEST 1 VIEW COMPARISON:  Multiple prior chest films. FINDINGS: The cardiac silhouette, mediastinal and hilar contours are stable. The left-sided chest tube is unchanged. Stable pneumomediastinum and very small left apical pneumothorax. Stable diffuse subcutaneous emphysema. Slight improved left basilar atelectasis. No definite pleural effusions. IMPRESSION: 1. Stable left-sided chest tube with very small apically all left pneumothorax. 2. Stable pneumomediastinum and extensive subcutaneous emphysema. 3. Slight improved left basilar atelectasis. Electronically Signed   By: Marijo Sanes M.D.   On: 10/14/2018 09:43     Assessment/Plan: S/P Procedure(s) (LRB): REDO CORONARY ARTERY BYPASS GRAFTING (CABG) x 1, SVG TO LAD, USING LEFT GREATER SAPHENOUS VEIN HARVESTED ENDOSCOPICALLY (N/A) TRANSESOPHAGEAL ECHOCARDIOGRAM (TEE) (N/A)  1. CV- Sinus Tachycardia- increase Lopressor to 25 mg BID, continue Cozaar 2. Pulm- extensive sub q air worsening with extension into left arm. CT without air leak... CXR with increase in left basilar pneumothorax, may require additional chest tube placement 3. Creatinine- WNL, K is WNL, weight continues to trend down, continue Lasix, 4. DM- cbgs controlled 5. Dispo- patient with tachycardia, mildly elevated BP- increase Lopressor to 25 mg BID, continue Cozaar, patient with persistent sub q emphysema now extending into left arm, CT is without air leak... CXR with increase in basilar pneumothorax, may require additional chest tube placement, continue current care   Ellwood Handler PA-C [phone]

## 2018-10-15 NOTE — Progress Notes (Signed)
Pt. Unwilling to walk this am d/t SQ air swelling in neck and face. PT. States that he will not walk because it is harder to breath and he is unable to see. Pt. Agreed to stand on scale. Pt. On 2 L [place]. O2 sat 98%. Will continue to monitor.

## 2018-10-15 NOTE — Anesthesia Postprocedure Evaluation (Signed)
Anesthesia Post Note  Patient: Melvin Brewer  Procedure(s) Performed: INSERTION OF LEFT CHEST TUBE (Left )     Patient location during evaluation: PACU Anesthesia Type: MAC Level of consciousness: awake and alert Pain management: pain level controlled Vital Signs Assessment: post-procedure vital signs reviewed and stable Respiratory status: spontaneous breathing, nonlabored ventilation, respiratory function stable and patient connected to nasal cannula oxygen Cardiovascular status: stable and blood pressure returned to baseline Postop Assessment: no apparent nausea or vomiting Anesthetic complications: no    Last Vitals:  Vitals:   10/15/18 1119 10/15/18 1200  BP: (!) 161/127 132/68  Pulse: 76 (!) 111  Resp: 14   Temp: 36.6 C   SpO2: 99% 99%    Last Pain:  Vitals:   10/15/18 1257  TempSrc:   PainSc: Asleep                 Ecko Beasley

## 2018-10-15 NOTE — Progress Notes (Signed)
Initial Nutrition Assessment  DOCUMENTATION CODES:   Obesity unspecified  INTERVENTION:   - Continue Ensure Enlive po BID, each supplement provides 350 kcal and 20 grams of protein (chocolate flavor)  - MVI with minerals daily  - Encourage adequate PO intake  NUTRITION DIAGNOSIS:   Increased nutrient needs related to chronic illness (COPD) as evidenced by estimated needs.  GOAL:   Patient will meet greater than or equal to 90% of their needs  MONITOR:   PO intake, Supplement acceptance, Labs, I & O's, Weight trends, Skin  REASON FOR ASSESSMENT:   LOS    ASSESSMENT:   68 year old male who presented on 6/25 for redo CABG x 1. PMH of COPD, DM, GERD, HLD, HTN, CAD. Pt extubated after procedure.  6/29 - developed subcutaneous emphysema, s/p right chest tube placement 7/06 - chest tube exchange in OR  Weight down a total of 12 lbs since admit. Pt is net negative 5.6 L.  Spoke with pt at bedside. Eyes swollen shut. Pt eating vanilla ice cream and orange jello at time of visit. Pt reports he is going to ask for someone to help "clean me up" after he finishing eating ice cream.  Pt states that today he is feeling much better especially after having the chest tube placed in the OR this morning. Pt reports he is breathing much better.  Pt reports that his appetite has been poor since admission and also states that he really does not like the food here. Pt reports he has been drinking the Ensure Enlive shakes (really enjoys chocolate flavor) and has been making himself eat at mealtimes. For example, pt will have half of sandwich for a meal. Pt shares that he is married to a Biomedical scientist and eats very well at home.  RD encouraged pt to continue to eat as well as he can at meals and drink Ensure Enlive shakes. Pt states that drinking the shakes will be "no problem" because he really enjoys them. Pt expresses understanding regarding the importance of adequate PO intake in healing and  maintaining lean muscle mass.  Meal Completion: 0-75% x last 8 recorded meals  Medications reviewed and include: Dulcolax, Colace, Ensure Enlive BID, Lasix, SSI, Metformin, Protonix, K-dur 20 mEq BID  Labs reviewed. CBG's: 108-142 x 24 hours  UOP: 1050 ml x 24 hours I/O's: -5.6 L since admit  NUTRITION - FOCUSED PHYSICAL EXAM:    Most Recent Value  Orbital Region  Unable to assess [swollen due to subcutaneous emphysema]  Upper Arm Region  No depletion  Thoracic and Lumbar Region  No depletion  Buccal Region  Unable to assess [swollen]  Temple Region  Unable to assess [swollen]  Clavicle Bone Region  No depletion  Clavicle and Acromion Bone Region  No depletion  Scapular Bone Region  No depletion  Patellar Region  No depletion  Anterior Thigh Region  No depletion  Posterior Calf Region  No depletion  Edema (RD Assessment)  Moderate [related to subcutaneous emphysema]  Hair  Reviewed  Eyes  Unable to assess  Mouth  Reviewed  Skin  Reviewed  Nails  Reviewed       Diet Order:   Diet Order            Diet heart healthy/carb modified Room service appropriate? Yes with Assist; Fluid consistency: Thin  Diet effective now              EDUCATION NEEDS:   Education needs have been addressed  Skin:  Skin  Assessment: Skin Integrity Issues: Incisions: closed incisions to left leg and left chest  Last BM:  10/12/18  Height:   Ht Readings from Last 1 Encounters:  10/04/18 5\' 9"  (1.753 m)    Weight:   Wt Readings from Last 1 Encounters:  10/15/18 94.7 kg    Ideal Body Weight:  72.7 kg  BMI:  Body mass index is 30.83 kg/m.  Estimated Nutritional Needs:   Kcal:  1900-2100  Protein:  95-110 grams  Fluid:  >/= 1.8 L    Gaynell Face, MS, RD, LDN Inpatient Clinical Dietitian Pager: [phone] Weekend/After Hours: [phone]

## 2018-10-15 NOTE — Transfer of Care (Signed)
Immediate Anesthesia Transfer of Care Note  Patient: VERLAN GROTZ  Procedure(s) Performed: INSERTION OF LEFT CHEST TUBE (Left )  Patient Location: PACU  Anesthesia Type:General  Level of Consciousness: awake, alert , oriented and patient cooperative  Airway & Oxygen Therapy: Patient Spontanous Breathing and Patient connected to nasal cannula oxygen  Post-op Assessment: Report given to RN and Post -op Vital signs reviewed and stable  Post vital signs: Reviewed and stable  Last Vitals:  Vitals Value Taken Time  BP 141/102 10/15/18 1052  Temp    Pulse 85 10/15/18 1054  Resp 16 10/15/18 1054  SpO2 100 % 10/15/18 1054  Vitals shown include unvalidated device data.  Last Pain:  Vitals:   10/15/18 0800  TempSrc:   PainSc: 0-No pain      Patients Stated Pain Goal: 0 (03/70/96 4383)  Complications: No apparent anesthesia complications

## 2018-10-15 NOTE — Progress Notes (Signed)
     [address]       [place] [address]             [phone]       New chest tube placed in OR today No major events

## 2018-10-15 NOTE — Anesthesia Preprocedure Evaluation (Addendum)
Anesthesia Evaluation  Patient identified by MRN, date of birth, ID band Patient awake    Reviewed: Allergy & Precautions, NPO status , Patient's Chart, lab work & pertinent test results  Airway Mallampati: IV  TM Distance: >3 FB Neck ROM: Full  Mouth opening: Limited Mouth Opening Comment: Peri-oral edema with facial edema Dental no notable dental hx.    Pulmonary COPD, former smoker,    Pulmonary exam normal breath sounds clear to auscultation + decreased breath sounds      Cardiovascular hypertension, + CAD, + CABG and + Peripheral Vascular Disease  Normal cardiovascular exam Rhythm:Regular Rate:Normal  EF 50%   Conclusion Right heart cath Mean wedge of 20 PA 45/21 mean of 30 Cardiac output of 7.2 Fick      Neuro/Psych negative neurological ROS  negative psych ROS   GI/Hepatic Neg liver ROS, hiatal hernia, GERD  ,  Endo/Other  diabetes  Renal/GU negative Renal ROS  negative genitourinary   Musculoskeletal negative musculoskeletal ROS (+)   Abdominal   Peds negative pediatric ROS (+)  Hematology negative hematology ROS (+)   Anesthesia Other Findings   Reproductive/Obstetrics negative OB ROS                            Anesthesia Physical  Anesthesia Plan  ASA: IV and emergent  Anesthesia Plan: MAC   Post-op Pain Management:    Induction: Intravenous  PONV Risk Score and Plan: 0  Airway Management Planned: Mask and Natural Airway  Additional Equipment:   Intra-op Plan:   Post-operative Plan:   Informed Consent: I have reviewed the patients History and Physical, chart, labs and discussed the procedure including the risks, benefits and alternatives for the proposed anesthesia with the patient or authorized representative who has indicated his/her understanding and acceptance.     Dental advisory given  Plan Discussed with: CRNA and  Anesthesiologist  Anesthesia Plan Comments:        Anesthesia Quick Evaluation

## 2018-10-15 NOTE — Progress Notes (Signed)
Dr Prescott Gum saw patient, OR consent signed. Patient stated he felt slightly better after breathing treatment.

## 2018-10-15 NOTE — Op Note (Signed)
NAME: Melvin Brewer, Melvin Brewer MEDICAL RECORD UX:[id number] ACCOUNT [id number] DATE OF BIRTH:05/16/1950 FACILITY: [facility] LOCATION: [facility] PHYSICIAN:Tiarah Shisler VAN TRIGT III, MD  OPERATIVE REPORT  DATE OF PROCEDURE:  10/15/2018  OPERATION:  Placement of left chest tube.  SURGEON:  Ivin Poot, MD  PREOPERATIVE DIAGNOSES:  Subcutaneous emphysema, left basilar pneumothorax, shortness of breath.  POSTOPERATIVE DIAGNOSES:  Subcutaneous emphysema, left basilar pneumothorax, shortness of breath.  ANESTHESIA:  Local 1% lidocaine with IV conscious sedation monitored by anesthesia.  DESCRIPTION OF PROCEDURE:  The patient was brought from preop holding where informed consent was documented and the proper site marked.  The patient had an earlier chest x-ray which showed a new, although small, basilar pneumothorax associated with  increased work of breathing.  He had had a previously placed chest tube, but there was no air leak and there was concern that the chest tube had obstructed.  I recommended removing the nonfunctional tube and replacing it with a new larger tube to help  resolve his subcutaneous emphysema and the new onset of a basilar pneumothorax on the left side.  PROCEDURE:  The patient was placed supine on the operating table with the head of the bed elevated because of his breathing difficulty.  He was monitored carefully by the anesthesia team.  The previously placed left chest tube was removed.  The left  chest was prepped and draped as a sterile field.  A proper time-out was performed.  Lidocaine 1% was infiltrated in the old incision and down to the chest wall and interspace.  A clamp was used to enter the pleural space and fluid and air exited  immediately.  Through this tract, a 28-French chest tube was positioned into the pleural space.  It was connected to an underwater seal Pleur-Evac drainage system and secured to the skin with #2 silk sutures.  A sterile dressing was applied.  The  patient  had improved symptoms and was able to breathe easier after placement of the tube.  A followup chest x-ray in the OR showed resolution of the pneumothorax and the chest tube was in an adequate position.  AN/NUANCE  D:10/15/2018 T:10/15/2018 JOB:[id number]

## 2018-10-15 NOTE — Progress Notes (Signed)
patient stating he is having more trouble breathing, increased o2 to 4L sao2 on 2 was 95%,  Increased work of breathing with accessory  muscles effort, absent breath sounds left lung, CRY reveals increasing pneumothorax. Dr Prescott Gum notified.  Currently NPO patient states having hard time swallowing feels he is more swollen. Tongue  Not large.  Face very swollen with crepitus throughout facial, neck shoulder back and thorax, arms. No air leak from chest tube.

## 2018-10-15 NOTE — Progress Notes (Signed)
Returneed back to room from PACU. Feels much better states breathing is better. Continues with SQ air facial, eyes swollen shut. Drinking ginger ale and tolerating, CT to -30 cm suction per order.

## 2018-10-15 NOTE — Plan of Care (Signed)
Received new chest tube on left side in OR today. Breathing much better post. Biggest concern is reoccurring pneumothorax and any  complications relating to sq emphysema ie skin integrity,  bruising swelling.   O2 to assist with sq air

## 2018-10-16 ENCOUNTER — Inpatient Hospital Stay (HOSPITAL_COMMUNITY): Payer: Medicare Other

## 2018-10-16 ENCOUNTER — Encounter (HOSPITAL_COMMUNITY): Payer: Self-pay | Admitting: Cardiothoracic Surgery

## 2018-10-16 LAB — CBC
HCT: 36.1 % — ABNORMAL LOW (ref 39.0–52.0)
Hemoglobin: 11.5 g/dL — ABNORMAL LOW (ref 13.0–17.0)
MCH: 31.8 pg (ref 26.0–34.0)
MCHC: 31.9 g/dL (ref 30.0–36.0)
MCV: 99.7 fL (ref 80.0–100.0)
Platelets: 348 10*3/uL (ref 150–400)
RBC: 3.62 MIL/uL — ABNORMAL LOW (ref 4.22–5.81)
RDW: 13.5 % (ref 11.5–15.5)
WBC: 12.4 10*3/uL — ABNORMAL HIGH (ref 4.0–10.5)
nRBC: 0 % (ref 0.0–0.2)

## 2018-10-16 LAB — GLUCOSE, CAPILLARY
Glucose-Capillary: 106 mg/dL — ABNORMAL HIGH (ref 70–99)
Glucose-Capillary: 125 mg/dL — ABNORMAL HIGH (ref 70–99)
Glucose-Capillary: 115 mg/dL — ABNORMAL HIGH (ref 70–99)
Glucose-Capillary: 104 mg/dL — ABNORMAL HIGH (ref 70–99)

## 2018-10-16 LAB — BASIC METABOLIC PANEL
Anion gap: 9 (ref 5–15)
BUN: 27 mg/dL — ABNORMAL HIGH (ref 8–23)
CO2: 31 mmol/L (ref 22–32)
Calcium: 9.1 mg/dL (ref 8.9–10.3)
Chloride: 96 mmol/L — ABNORMAL LOW (ref 98–111)
Creatinine, Ser: 1.12 mg/dL (ref 0.61–1.24)
GFR calc Af Amer: >60 mL/min (ref >60)
GFR calc non Af Amer: >60 mL/min (ref >60)
Glucose, Bld: 114 mg/dL — ABNORMAL HIGH (ref 70–99)
Potassium: 5.1 mmol/L (ref 3.5–5.1)
Sodium: 136 mmol/L (ref 135–145)

## 2018-10-16 MED ORDER — CEFAZOLIN SODIUM-DEXTROSE 1-4 GM/50ML-% IV SOLN
1.0000 g | Freq: Three times a day (TID) | INTRAVENOUS | Status: DC
  Administered 2018-10-16 (×3): 1 g via INTRAVENOUS
  Filled 2018-10-16 (×7): qty 50

## 2018-10-16 NOTE — Progress Notes (Addendum)
TCTS DAILY ICU PROGRESS NOTE                   [address]            [place] [address]          [phone]   1 Day Post-Op Procedure(s) (LRB): INSERTION OF LEFT CHEST TUBE (Left)  Total Length of Stay:  LOS: 12 days   Subjective:  No new complaints.  Feels like he is breathing better after new chest tube was placed yesterday.  States he feels a little less swollen.  Objective: Vital signs in last 24 hours: Temp:  [97.6 F (36.4 C)-98.3 F (36.8 C)] 98.3 F (36.8 C) (07/07 0351) Pulse Rate:  [72-111] 92 (07/07 0700) Cardiac Rhythm: Normal sinus rhythm (07/07 0400) Resp:  [11-20] 16 (07/07 0700) BP: (102-170)/(61-127) 128/87 (07/07 0700) SpO2:  [91 %-100 %] 100 % (07/07 0700) Weight:  [94.5 kg] 94.5 kg (07/07 0600)  Filed Weights   10/14/18 0600 10/15/18 0500 10/16/18 0600  Weight: 96.8 kg 94.7 kg 94.5 kg    Weight change: -0.2 kg   Intake/Output from previous day: 07/06 0701 - 07/07 0700 In: 767 [P.O.:567; I.V.:200] Out: 772 [Urine:600; Chest Tube:172]  Intake/Output this shift: No intake/output data recorded.  Current Meds: Scheduled Meds: . albuterol  2.5 mg Inhalation TID  . aspirin EC  325 mg Oral Daily   Or  . aspirin  324 mg Per Tube Daily  . azithromycin  250 mg Oral Q M,W,F  . bisacodyl  10 mg Oral Daily   Or  . bisacodyl  10 mg Rectal Daily  . budesonide-formoterol  2 puff Inhalation BID  . Chlorhexidine Gluconate Cloth  6 each Topical Daily  . docusate sodium  200 mg Oral Daily  . enoxaparin (LOVENOX) injection  40 mg Subcutaneous Q24H  . feeding supplement (ENSURE ENLIVE)  237 mL Oral BID BM  . fluticasone  1 spray Each Nare Daily  . furosemide  40 mg Oral Daily  . guaiFENesin  600 mg Oral BID  . insulin aspart  0-15 Units Subcutaneous TID WC  . loratadine  10 mg Oral Daily  . losartan  25 mg Oral Daily  . metFORMIN  500 mg Oral BID WC  . metoprolol tartrate  25 mg Oral BID   Or  . metoprolol tartrate  25 mg Per Tube  BID  . montelukast  10 mg Oral QPM  . multivitamin with minerals  1 tablet Oral Daily  . pantoprazole  40 mg Oral Daily  . rosuvastatin  40 mg Oral Daily  . sodium chloride flush  3 mL Intravenous Q12H  . Tiotropium Bromide Monohydrate  5 mcg Inhalation Daily  . valACYclovir  500 mg Oral QPM   Continuous Infusions: .  ceFAZolin (ANCEF) IV    . lactated ringers     PRN Meds:.albuterol, ALPRAZolam, metoprolol tartrate, ondansetron (ZOFRAN) IV, oxyCODONE, sodium chloride flush, traMADol, white petrolatum  General appearance: alert, cooperative and no distress, extensive sub q air along bilateral chest, head, neck, upper extremities Heart: regular rate and rhythm Lungs: clear to auscultation bilaterally Abdomen: soft, non-tender; bowel sounds normal; no masses,  no organomegaly Extremities: edema trace Wound: clean, some serous drainage from sternotomy, EVH site healing without issues  Lab Results: CBC: Recent Labs    10/16/18 0253  WBC 12.4*  HGB 11.5*  HCT 36.1*  PLT 348   BMET:  Recent Labs    10/14/18 0324 10/16/18  0253  NA 136 136  K 4.5 5.1  CL 96* 96*  CO2 31 31  GLUCOSE 107* 114*  BUN 31* 27*  CREATININE 1.03 1.12  CALCIUM 8.9 9.1    CMET: Lab Results  Component Value Date   WBC 12.4 (H) 10/16/2018   HGB 11.5 (L) 10/16/2018   HCT 36.1 (L) 10/16/2018   PLT 348 10/16/2018   GLUCOSE 114 (H) 10/16/2018   ALT 22 10/08/2018   AST 21 10/08/2018   NA 136 10/16/2018   K 5.1 10/16/2018   CL 96 (L) 10/16/2018   CREATININE 1.12 10/16/2018   BUN 27 (H) 10/16/2018   CO2 31 10/16/2018   INR 1.6 (H) 10/04/2018   HGBA1C 6.2 (H) 10/01/2018      PT/INR: No results for input(s): LABPROT, INR in the last 72 hours. Radiology: Dg Chest 1 View  Result Date: 10/15/2018 CLINICAL DATA:  Left pneumothorax.  Subcutaneous emphysema. EXAM: CHEST  1 VIEW 10:36 a.m. COMPARISON:  10/15/2018 at 5:07 a.m. and 10/14/2018 and 10/13/2018 FINDINGS: New left chest tube has been  inserted. There is no definable left pneumothorax. Pneumomediastinum is present. Minimal atelectasis at the left lung base, improved minimal right base atelectasis, unchanged. Extensive bilateral subcutaneous emphysema is present. Heart size and pulmonary vascularity are normal. IMPRESSION: New left-sided chest tube in place no visible residual left pneumothorax. Unchanged pneumomediastinum and subcutaneous emphysema. Electronically Signed   By: Lorriane Shire M.D.   On: 10/15/2018 11:08     Assessment/Plan: S/P Procedure(s) (LRB): INSERTION OF LEFT CHEST TUBE (Left)  1. CV- NSR, tachycardia improved- continue Lopressor, Cozzar 2. Pulm- S/P Left sided chest tube placement yesterday, CXR with stable appearance of left basilar/apical pneumothorax, sub q air persists, leave chest tube on suction today 3. Renal- creatinine remains stable, K is up to 5.1, will stop supplementation, continue diuretics 4. ID- sternum with serous drainage, no evidence of infection, keep wound clean and dry, monitor 5. DM- sugars remain controlled 6. Dispo- patient is hemodynamically stable from surgery, biggest issues is underlying COPD, CT in place on suction with minimal air leak, watch sternal drainage, continue current care     Ellwood Handler PA-C [phone]   10/16/2018 7:44 AM   Overall improved after larger chest tube Cont current care patient examined and medical record reviewed,agree with above note. [name] 10/16/2018

## 2018-10-16 NOTE — Progress Notes (Signed)
      [address]       [place] [address]             [phone]      No complaints this am  Still with extensive SQ emphysema, although slightly improved from this weekend  Continue current care  Rahul Malinak C. Roxan Hockey, MD [facility] [phone]

## 2018-10-17 ENCOUNTER — Inpatient Hospital Stay (HOSPITAL_COMMUNITY): Payer: Medicare Other

## 2018-10-17 LAB — CBC
HCT: 33.2 % — ABNORMAL LOW (ref 39.0–52.0)
Hemoglobin: 10.6 g/dL — ABNORMAL LOW (ref 13.0–17.0)
MCH: 31.6 pg (ref 26.0–34.0)
MCHC: 31.9 g/dL (ref 30.0–36.0)
MCV: 99.1 fL (ref 80.0–100.0)
Platelets: 272 10*3/uL (ref 150–400)
RBC: 3.35 MIL/uL — ABNORMAL LOW (ref 4.22–5.81)
RDW: 13.5 % (ref 11.5–15.5)
WBC: 12.5 10*3/uL — ABNORMAL HIGH (ref 4.0–10.5)
nRBC: 0 % (ref 0.0–0.2)

## 2018-10-17 LAB — BASIC METABOLIC PANEL
Anion gap: 12 (ref 5–15)
BUN: 27 mg/dL — ABNORMAL HIGH (ref 8–23)
CO2: 28 mmol/L (ref 22–32)
Calcium: 8.7 mg/dL — ABNORMAL LOW (ref 8.9–10.3)
Chloride: 97 mmol/L — ABNORMAL LOW (ref 98–111)
Creatinine, Ser: 1 mg/dL (ref 0.61–1.24)
GFR calc Af Amer: >60 mL/min (ref >60)
GFR calc non Af Amer: >60 mL/min (ref >60)
Glucose, Bld: 96 mg/dL (ref 70–99)
Potassium: 4.2 mmol/L (ref 3.5–5.1)
Sodium: 137 mmol/L (ref 135–145)

## 2018-10-17 LAB — GLUCOSE, CAPILLARY
Glucose-Capillary: 84 mg/dL (ref 70–99)
Glucose-Capillary: 146 mg/dL — ABNORMAL HIGH (ref 70–99)
Glucose-Capillary: 135 mg/dL — ABNORMAL HIGH (ref 70–99)
Glucose-Capillary: 80 mg/dL (ref 70–99)

## 2018-10-17 MED ORDER — SODIUM CHLORIDE 0.9 % IV SOLN
1.5000 g | Freq: Two times a day (BID) | INTRAVENOUS | Status: AC
  Administered 2018-10-17 – 2018-10-19 (×6): 1.5 g via INTRAVENOUS
  Filled 2018-10-17 (×6): qty 1.5

## 2018-10-17 MED ORDER — CEFUROXIME SODIUM 1.5 G IV SOLR
1.5000 g | Freq: Two times a day (BID) | INTRAVENOUS | Status: DC
  Filled 2018-10-17: qty 1.5

## 2018-10-17 MED ORDER — POLYVINYL ALCOHOL 1.4 % OP SOLN
1.0000 [drp] | OPHTHALMIC | Status: DC | PRN
  Administered 2018-10-19 – 2018-10-21 (×5): 1 [drp] via OPHTHALMIC
  Filled 2018-10-17: qty 15

## 2018-10-17 NOTE — Progress Notes (Addendum)
TCTS DAILY ICU PROGRESS NOTE                   [address]            [place] [address]          [phone]   2 Days Post-Op Procedure(s) (LRB): INSERTION OF LEFT CHEST TUBE (Left)  Total Length of Stay:  LOS: 13 days   Subjective:  No new complaints.  States the swelling has improved some he is now able to see some.  Coughing up some phlegm.  Objective: Vital signs in last 24 hours: Temp:  [97.6 F (36.4 C)-98.5 F (36.9 C)] 98 F (36.7 C) (07/08 0348) Pulse Rate:  [76-111] 83 (07/08 0600) Cardiac Rhythm: Normal sinus rhythm (07/08 0430) Resp:  [11-30] 14 (07/08 0600) BP: (83-135)/(43-110) 111/51 (07/08 0600) SpO2:  [91 %-100 %] 100 % (07/08 0600) Weight:  [94.5 kg] 94.5 kg (07/08 0500)  Filed Weights   10/15/18 0500 10/16/18 0600 10/17/18 0500  Weight: 94.7 kg 94.5 kg 94.5 kg    Weight change: 0 kg   Intake/Output from previous day: 07/07 0701 - 07/08 0700 In: 1180 [P.O.:1080; IV Piggyback:100] Out: 680 [Urine:600; Chest Tube:80]  Current Meds: Scheduled Meds: . albuterol  2.5 mg Inhalation TID  . aspirin EC  325 mg Oral Daily   Or  . aspirin  324 mg Per Tube Daily  . azithromycin  250 mg Oral Q M,W,F  . bisacodyl  10 mg Oral Daily   Or  . bisacodyl  10 mg Rectal Daily  . budesonide-formoterol  2 puff Inhalation BID  . Chlorhexidine Gluconate Cloth  6 each Topical Daily  . docusate sodium  200 mg Oral Daily  . enoxaparin (LOVENOX) injection  40 mg Subcutaneous Q24H  . feeding supplement (ENSURE ENLIVE)  237 mL Oral BID BM  . fluticasone  1 spray Each Nare Daily  . furosemide  40 mg Oral Daily  . guaiFENesin  600 mg Oral BID  . insulin aspart  0-15 Units Subcutaneous TID WC  . loratadine  10 mg Oral Daily  . losartan  25 mg Oral Daily  . metFORMIN  500 mg Oral BID WC  . metoprolol tartrate  25 mg Oral BID   Or  . metoprolol tartrate  25 mg Per Tube BID  . montelukast  10 mg Oral QPM  . multivitamin with minerals  1 tablet Oral  Daily  . pantoprazole  40 mg Oral Daily  . rosuvastatin  40 mg Oral Daily  . sodium chloride flush  3 mL Intravenous Q12H  . Tiotropium Bromide Monohydrate  5 mcg Inhalation Daily  . valACYclovir  500 mg Oral QPM   Continuous Infusions: .  ceFAZolin (ANCEF) IV 1 g (10/16/18 2212)  . lactated ringers     PRN Meds:.albuterol, ALPRAZolam, metoprolol tartrate, ondansetron (ZOFRAN) IV, oxyCODONE, sodium chloride flush, traMADol, white petrolatum  General appearance: alert, cooperative and no distress Heart: regular rate and rhythm Lungs: clear to auscultation bilaterally Abdomen: soft, non-tender; bowel sounds normal; no masses,  no organomegaly Extremities: edema trace Wound: clean and dry  Lab Results: CBC: Recent Labs    10/16/18 0253 10/17/18 0238  WBC 12.4* 12.5*  HGB 11.5* 10.6*  HCT 36.1* 33.2*  PLT 348 272   BMET:  Recent Labs    10/16/18 0253 10/17/18 0238  NA 136 137  K 5.1 4.2  CL 96* 97*  CO2 31 28  GLUCOSE 114* 96  BUN 27* 27*  CREATININE 1.12 1.00  CALCIUM 9.1 8.7*    CMET: Lab Results  Component Value Date   WBC 12.5 (H) 10/17/2018   HGB 10.6 (L) 10/17/2018   HCT 33.2 (L) 10/17/2018   PLT 272 10/17/2018   GLUCOSE 96 10/17/2018   ALT 22 10/08/2018   AST 21 10/08/2018   NA 137 10/17/2018   K 4.2 10/17/2018   CL 97 (L) 10/17/2018   CREATININE 1.00 10/17/2018   BUN 27 (H) 10/17/2018   CO2 28 10/17/2018   INR 1.6 (H) 10/04/2018   HGBA1C 6.2 (H) 10/01/2018      PT/INR: No results for input(s): LABPROT, INR in the last 72 hours. Radiology: Dg Chest Port 1 View  Result Date: 10/17/2018 CLINICAL DATA:  Pneumothorax, chest tube placement. EXAM: PORTABLE CHEST 1 VIEW COMPARISON:  Radiograph of October 16, 2018. FINDINGS: Stable pneumomediastinum. Sternotomy wires are noted. Stable left basilar chest tube is noted. Probable mild left apical pneumothorax is noted. Stable extensive subcutaneous emphysema is noted bilaterally. Stable bibasilar atelectasis.  IMPRESSION: Stable pneumomediastinum. Stable left basilar chest tube is noted with probable mild left apical pneumothorax. Stable extensive subcutaneous emphysema is noted bilaterally. Electronically Signed   By: Marijo Conception M.D.   On: 10/17/2018 07:40     Assessment/Plan: S/P Procedure(s) (LRB): INSERTION OF LEFT CHEST TUBE (Left)  1. CV- hemodynamically stable continue Lopressor, Cozaar 2. Pulm- COPD, Chest tube with small 1+ air leak, leave chest tube on suction, extensive sub q air is slightly improved, CXR with some improvement of pneumothorax 3. Renal- creatinine remains stable, weight stable 4. ID- sternotomy with fat necrosis, no evidence of frank infection, on empiric ABX, afebrile, no leukocytosis 5. DM- sugars remain controlled 6. Dispo- patient stable, mild improvement in sub q emphysema, leave chest tube on suction, remains hemodynamically stable, continue current care    Ellwood Handler PA-C [phone]   10/17/2018 7:54 AM   subQ air improved Increased sternal drainage- fat necrosis. Check culture and cont empiric antibiotic Cont chest tube to 30 suction Keep in ICU for respiratory status  patient examined and medical record reviewed,agree with above note. [name] 10/17/2018

## 2018-10-17 NOTE — Progress Notes (Signed)
CT surgery p.m. Rounds  Patient sitting up in chair Facial subcutaneous air is improved and he can now slightly open his eyes No air leak from chest tube on 30 cm suction Minimal serosanguineous drainage from sternal wound-probable fat necrosis Continue current care

## 2018-10-18 ENCOUNTER — Inpatient Hospital Stay (HOSPITAL_COMMUNITY): Payer: Medicare Other

## 2018-10-18 LAB — CBC
HCT: 33.6 % — ABNORMAL LOW (ref 39.0–52.0)
Hemoglobin: 10.9 g/dL — ABNORMAL LOW (ref 13.0–17.0)
MCH: 31.8 pg (ref 26.0–34.0)
MCHC: 32.4 g/dL (ref 30.0–36.0)
MCV: 98 fL (ref 80.0–100.0)
Platelets: 286 10*3/uL (ref 150–400)
RBC: 3.43 MIL/uL — ABNORMAL LOW (ref 4.22–5.81)
RDW: 13.5 % (ref 11.5–15.5)
WBC: 10.3 10*3/uL (ref 4.0–10.5)
nRBC: 0 % (ref 0.0–0.2)

## 2018-10-18 LAB — BASIC METABOLIC PANEL
Anion gap: 10 (ref 5–15)
BUN: 21 mg/dL (ref 8–23)
CO2: 28 mmol/L (ref 22–32)
Calcium: 8.5 mg/dL — ABNORMAL LOW (ref 8.9–10.3)
Chloride: 95 mmol/L — ABNORMAL LOW (ref 98–111)
Creatinine, Ser: 1.07 mg/dL (ref 0.61–1.24)
GFR calc Af Amer: >60 mL/min (ref >60)
GFR calc non Af Amer: >60 mL/min (ref >60)
Glucose, Bld: 95 mg/dL (ref 70–99)
Potassium: 3.8 mmol/L (ref 3.5–5.1)
Sodium: 133 mmol/L — ABNORMAL LOW (ref 135–145)

## 2018-10-18 LAB — GLUCOSE, CAPILLARY
Glucose-Capillary: 136 mg/dL — ABNORMAL HIGH (ref 70–99)
Glucose-Capillary: 146 mg/dL — ABNORMAL HIGH (ref 70–99)
Glucose-Capillary: 108 mg/dL — ABNORMAL HIGH (ref 70–99)
Glucose-Capillary: 156 mg/dL — ABNORMAL HIGH (ref 70–99)

## 2018-10-18 MED ORDER — MAGIC MOUTHWASH W/LIDOCAINE
5.0000 mL | Freq: Three times a day (TID) | ORAL | Status: DC | PRN
  Administered 2018-10-18 – 2018-10-22 (×5): 5 mL via ORAL
  Filled 2018-10-18 (×7): qty 5

## 2018-10-18 NOTE — Progress Notes (Signed)
TCTS Evening Rounds  Stable 24 hours Left CT in place In good spirits. Plan per PVT.

## 2018-10-18 NOTE — Progress Notes (Addendum)
TCTS DAILY ICU PROGRESS NOTE                   [address]            [place] [address]          [phone]   3 Days Post-Op Procedure(s) (LRB): INSERTION OF LEFT CHEST TUBE (Left)  Total Length of Stay:  LOS: 14 days   Subjective:  Doing okay.  Complains of mouth sores this morning.  Objective: Vital signs in last 24 hours: Temp:  [97.6 F (36.4 C)-98.1 F (36.7 C)] 98.1 F (36.7 C) (07/08 2000) Pulse Rate:  [74-100] 93 (07/09 0700) Cardiac Rhythm: Normal sinus rhythm (07/09 0400) Resp:  [10-26] 13 (07/09 0700) BP: (89-133)/(53-85) 98/72 (07/09 0700) SpO2:  [92 %-100 %] 96 % (07/09 0700) Weight:  [94.9 kg] 94.9 kg (07/09 0500)  Filed Weights   10/16/18 0600 10/17/18 0500 10/18/18 0500  Weight: 94.5 kg 94.5 kg 94.9 kg    Weight change: 0.4 kg   Intake/Output from previous day: 07/08 0701 - 07/09 0700 In: 1820.3 [P.O.:800; IV Piggyback:1020.3] Out: 725 [Urine:675; Chest Tube:50]  Current Meds: Scheduled Meds: . albuterol  2.5 mg Inhalation TID  . aspirin EC  325 mg Oral Daily   Or  . aspirin  324 mg Per Tube Daily  . azithromycin  250 mg Oral Q M,W,F  . bisacodyl  10 mg Oral Daily   Or  . bisacodyl  10 mg Rectal Daily  . budesonide-formoterol  2 puff Inhalation BID  . Chlorhexidine Gluconate Cloth  6 each Topical Daily  . docusate sodium  200 mg Oral Daily  . enoxaparin (LOVENOX) injection  40 mg Subcutaneous Q24H  . feeding supplement (ENSURE ENLIVE)  237 mL Oral BID BM  . fluticasone  1 spray Each Nare Daily  . furosemide  40 mg Oral Daily  . guaiFENesin  600 mg Oral BID  . insulin aspart  0-15 Units Subcutaneous TID WC  . loratadine  10 mg Oral Daily  . losartan  25 mg Oral Daily  . metFORMIN  500 mg Oral BID WC  . metoprolol tartrate  25 mg Oral BID   Or  . metoprolol tartrate  25 mg Per Tube BID  . montelukast  10 mg Oral QPM  . multivitamin with minerals  1 tablet Oral Daily  . pantoprazole  40 mg Oral Daily  .  rosuvastatin  40 mg Oral Daily  . sodium chloride flush  3 mL Intravenous Q12H  . Tiotropium Bromide Monohydrate  5 mcg Inhalation Daily  . valACYclovir  500 mg Oral QPM   Continuous Infusions: . cefUROXime (ZINACEF)  IV Stopped (10/17/18 2153)  . lactated ringers     PRN Meds:.albuterol, ALPRAZolam, magic mouthwash w/lidocaine, metoprolol tartrate, ondansetron (ZOFRAN) IV, oxyCODONE, polyvinyl alcohol, sodium chloride flush, traMADol, white petrolatum  General appearance: alert, cooperative and no distress Heart: regular rate and rhythm Lungs: clear to auscultation bilaterally Abdomen: soft, non-tender; bowel sounds normal; no masses,  no organomegaly Extremities: edema minimal Wound: clean and dry sternotomy site this morning, EVH sites healing well  Lab Results: CBC: Recent Labs    10/17/18 0238 10/18/18 0226  WBC 12.5* 10.3  HGB 10.6* 10.9*  HCT 33.2* 33.6*  PLT 272 286   BMET:  Recent Labs    10/17/18 0238 10/18/18 0226  NA 137 133*  K 4.2 3.8  CL 97* 95*  CO2 28 28  GLUCOSE 96 95  BUN 27* 21  CREATININE 1.00 1.07  CALCIUM 8.7* 8.5*    CMET: Lab Results  Component Value Date   WBC 10.3 10/18/2018   HGB 10.9 (L) 10/18/2018   HCT 33.6 (L) 10/18/2018   PLT 286 10/18/2018   GLUCOSE 95 10/18/2018   ALT 22 10/08/2018   AST 21 10/08/2018   NA 133 (L) 10/18/2018   K 3.8 10/18/2018   CL 95 (L) 10/18/2018   CREATININE 1.07 10/18/2018   BUN 21 10/18/2018   CO2 28 10/18/2018   INR 1.6 (H) 10/04/2018   HGBA1C 6.2 (H) 10/01/2018      PT/INR: No results for input(s): LABPROT, INR in the last 72 hours. Radiology: Dg Chest Port 1 View  Result Date: 10/18/2018 CLINICAL DATA:  Chest tube.  Pneumothorax. EXAM: PORTABLE CHEST 1 VIEW COMPARISON:  10/17/2018. FINDINGS: Left chest tube in stable position. Tiny left apical pneumothorax again can not be excluded. Stable pneumomediastinum. Prior CABG. Heart size normal. No focal infiltrate. Mild left base atelectasis. No  pleural effusion or pneumothorax. Diffuse bilateral chest wall severe subcutaneous emphysema again noted. Prior cervical spine fusion. IMPRESSION: 1. Left chest tube in stable position. Tiny left apical pneumothorax again can not be excluded. Stable pneumomediastinum. 2.  Mild left base atelectasis. 3.  Prior CABG.  Heart size stable. Electronically Signed   By: Marcello Moores  Register   On: 10/18/2018 06:54     Assessment/Plan: S/P Procedure(s) (LRB): INSERTION OF LEFT CHEST TUBE (Left)  1. CV- hemodynamically stable continue Lopressor, Cozaar 2. Pulm- COPD, extensive sub q emphysema persists, minimal improvement today, CT w/o air leak, possibly decrease suction to 20 cm will discuss with PVT, CXR with stable appearance of left apical pneumothorax 3. Renal- creatinine remains stable, on Lasix daily 4. ID- remains afebrile, no leukocytosis, on empiric ABX 5. Canker sore- magic mouthwash prn 6. DM- sugars controlled 7. 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 [name] 10/18/2018

## 2018-10-19 ENCOUNTER — Inpatient Hospital Stay (HOSPITAL_COMMUNITY): Payer: Medicare Other

## 2018-10-19 LAB — GLUCOSE, CAPILLARY
Glucose-Capillary: 100 mg/dL — ABNORMAL HIGH (ref 70–99)
Glucose-Capillary: 128 mg/dL — ABNORMAL HIGH (ref 70–99)
Glucose-Capillary: 107 mg/dL — ABNORMAL HIGH (ref 70–99)
Glucose-Capillary: 99 mg/dL (ref 70–99)

## 2018-10-19 MED ORDER — HYDROCOD POLST-CPM POLST ER 10-8 MG/5ML PO SUER
5.0000 mL | Freq: Two times a day (BID) | ORAL | Status: DC
  Administered 2018-10-19 – 2018-10-31 (×26): 5 mL via ORAL
  Filled 2018-10-19 (×26): qty 5

## 2018-10-19 MED ORDER — ZINC SULFATE 220 (50 ZN) MG PO CAPS
220.0000 mg | ORAL_CAPSULE | Freq: Two times a day (BID) | ORAL | Status: DC
  Administered 2018-10-19 – 2018-10-31 (×25): 220 mg via ORAL
  Filled 2018-10-19 (×26): qty 1

## 2018-10-19 MED ORDER — BOOST PO LIQD
237.0000 mL | Freq: Three times a day (TID) | ORAL | Status: DC
  Administered 2018-10-19 (×2): 237 mL via ORAL
  Filled 2018-10-19 (×10): qty 237

## 2018-10-19 MED ORDER — ZINC SULFATE 220 (50 ZN) MG PO CAPS: 220.0000 mg | ORAL_CAPSULE | Freq: Every day | ORAL | Status: DC

## 2018-10-19 MED ORDER — SODIUM CHLORIDE 0.9 % IV SOLN
INTRAVENOUS | Status: DC | PRN
  Administered 2018-10-19: 500 mL via INTRAVENOUS
  Administered 2018-10-26: 250 mL via INTRAVENOUS

## 2018-10-19 MED ORDER — SORBITOL 70 % PO SOLN
30.0000 mL | Freq: Every day | ORAL | Status: DC | PRN
  Filled 2018-10-19 (×2): qty 30

## 2018-10-19 NOTE — Progress Notes (Addendum)
TCTS DAILY ICU PROGRESS NOTE                   [address]            [place] [address]          [phone]   4 Days Post-Op Procedure(s) (LRB): INSERTION OF LEFT CHEST TUBE (Left)  Total Length of Stay:  LOS: 15 days   Subjective:  No new complaints.  Starting to get frustrated.  Objective: Vital signs in last 24 hours: Temp:  [97.3 F (36.3 C)-98.6 F (37 C)] 97.4 F (36.3 C) (07/10 0640) Pulse Rate:  [80-104] 91 (07/10 0700) Cardiac Rhythm: Normal sinus rhythm (07/10 0400) Resp:  [13-24] 19 (07/10 0700) BP: (88-129)/(48-79) 117/71 (07/10 0700) SpO2:  [95 %-100 %] 95 % (07/10 0700) Weight:  [94.9 kg] 94.9 kg (07/10 0500)  Filed Weights   10/17/18 0500 10/18/18 0500 10/19/18 0500  Weight: 94.5 kg 94.9 kg 94.9 kg    Weight change: 0 kg     Intake/Output from previous day: 07/09 0701 - 07/10 0700 In: 1083 [P.O.:880; I.V.:3; IV Piggyback:200] Out: 1030 [Urine:950; Chest Tube:80]  Current Meds: Scheduled Meds:  aspirin EC  325 mg Oral Daily   Or   aspirin  324 mg Per Tube Daily   azithromycin  250 mg Oral Q M,W,F   bisacodyl  10 mg Oral Daily   Or   bisacodyl  10 mg Rectal Daily   budesonide-formoterol  2 puff Inhalation BID   Chlorhexidine Gluconate Cloth  6 each Topical Daily   docusate sodium  200 mg Oral Daily   enoxaparin (LOVENOX) injection  40 mg Subcutaneous Q24H   feeding supplement (ENSURE ENLIVE)  237 mL Oral BID BM   fluticasone  1 spray Each Nare Daily   furosemide  40 mg Oral Daily   guaiFENesin  600 mg Oral BID   insulin aspart  0-15 Units Subcutaneous TID WC   loratadine  10 mg Oral Daily   losartan  25 mg Oral Daily   metFORMIN  500 mg Oral BID WC   metoprolol tartrate  25 mg Oral BID   Or   metoprolol tartrate  25 mg Per Tube BID   montelukast  10 mg Oral QPM   multivitamin with minerals  1 tablet Oral Daily   pantoprazole  40 mg Oral Daily   rosuvastatin  40 mg Oral Daily   sodium  chloride flush  3 mL Intravenous Q12H   Tiotropium Bromide Monohydrate  5 mcg Inhalation Daily   valACYclovir  500 mg Oral QPM   Continuous Infusions:  cefUROXime (ZINACEF)  IV Stopped (10/18/18 2159)   lactated ringers     PRN Meds:.albuterol, ALPRAZolam, magic mouthwash w/lidocaine, metoprolol tartrate, ondansetron (ZOFRAN) IV, oxyCODONE, polyvinyl alcohol, sodium chloride flush, traMADol, white petrolatum  General appearance: alert, cooperative and no distress... persistent extensive sub q emphysema, unchanged Heart: regular rate and rhythm Lungs: clear to auscultation bilaterally Abdomen: soft, non-tender; bowel sounds normal; no masses,  no organomegaly Extremities: extremities normal, atraumatic, no cyanosis or edema Wound: clean and dry, EVH sites healing without difficulty.  Lab Results: CBC: Recent Labs    10/17/18 0238 10/18/18 0226  WBC 12.5* 10.3  HGB 10.6* 10.9*  HCT 33.2* 33.6*  PLT 272 286   BMET:  Recent Labs    10/17/18 0238 10/18/18 0226  NA 137 133*  K 4.2 3.8  CL 97* 95*  CO2 28 28  GLUCOSE 96  95  BUN 27* 21  CREATININE 1.00 1.07  CALCIUM 8.7* 8.5*    CMET: Lab Results  Component Value Date   WBC 10.3 10/18/2018   HGB 10.9 (L) 10/18/2018   HCT 33.6 (L) 10/18/2018   PLT 286 10/18/2018   GLUCOSE 95 10/18/2018   ALT 22 10/08/2018   AST 21 10/08/2018   NA 133 (L) 10/18/2018   K 3.8 10/18/2018   CL 95 (L) 10/18/2018   CREATININE 1.07 10/18/2018   BUN 21 10/18/2018   CO2 28 10/18/2018   INR 1.6 (H) 10/04/2018   HGBA1C 6.2 (H) 10/01/2018      PT/INR: No results for input(s): LABPROT, INR in the last 72 hours. 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 EXAM: PORTABLE CHEST 1 VIEW COMPARISON:  10/18/2018, 10/17/2018, 10/16/2018,  10/15/2018 FINDINGS: Cardiomediastinal silhouette unchanged in size and contour. Surgical changes of median sternotomy and CABG. Persisting small left pneumothorax with unchanged position of the left thoracostomy tube. No visualized right pneumothorax. Extensive subcutaneous/myofacial, and mediastinal gas is unchanged from the prior chest x-ray. Patchy opacities within the a lungs forearm, unchanged. IMPRESSION: Persisting small left pneumothorax with unchanged thoracostomy tube. Redemonstration of subcutaneous, myofacial, mediastinal gas. Surgical changes of median sternotomy and CABG. No visualized right pneumothorax. Electronically Signed   By: Corrie Mckusick D.O.   On: 10/19/2018 07:33     Assessment/Plan: S/P Procedure(s) (LRB): INSERTION OF LEFT CHEST TUBE (Left)  1. CV- hemodynamically stable, continue Lopressor, Cozaar 2. Pulm- COPD, CT in place with suction at 20 cm with small 1+ air leak with cough, CXR with re-development of basilar component of pneumothorax, apical pneumothorax is stable, sub q air persists 3. Renal- weight remains stable, creatinine has been stable, will get repeat labs in a few days 4. DM- sugars remain controlled 5. Dispo- patient stable, persistent sub q emphysema, CXR with re-development of left basilar pneumothorax, apical pneumothorax is stable, CT is currently on 20 cm of suction with small 1+ air leak with cough, will leave in place today  Ellwood Handler PA-C [phone]   10/19/2018 7:50 AM   Coughing last pm- small basilar L pnrx on am CXR. Started on Tussionex Facial subQ air marginally better- eyes slightly open Goal to slowly reduce suction and anticipate home on Mini - express if air leak persisits  patient examined and medical record reviewed,agree with above note. [name] 10/19/2018

## 2018-10-19 NOTE — Progress Notes (Signed)
Patient ID: Melvin Brewer, male   DOB: 03-Jan-1951, 68 y.o.   MRN: [id number]  TCTS Evening Rounds:  Hemodynamically stable   Chest tube on 20 cm suction with small air leak with cough.  Sub Q air stable.

## 2018-10-20 ENCOUNTER — Inpatient Hospital Stay (HOSPITAL_COMMUNITY): Payer: Medicare Other

## 2018-10-20 LAB — CBC
HCT: 33.9 % — ABNORMAL LOW (ref 39.0–52.0)
Hemoglobin: 10.9 g/dL — ABNORMAL LOW (ref 13.0–17.0)
MCH: 31.7 pg (ref 26.0–34.0)
MCHC: 32.2 g/dL (ref 30.0–36.0)
MCV: 98.5 fL (ref 80.0–100.0)
Platelets: 323 10*3/uL (ref 150–400)
RBC: 3.44 MIL/uL — ABNORMAL LOW (ref 4.22–5.81)
RDW: 13.5 % (ref 11.5–15.5)
WBC: 8.5 10*3/uL (ref 4.0–10.5)
nRBC: 0 % (ref 0.0–0.2)

## 2018-10-20 LAB — GLUCOSE, CAPILLARY
Glucose-Capillary: 92 mg/dL (ref 70–99)
Glucose-Capillary: 139 mg/dL — ABNORMAL HIGH (ref 70–99)
Glucose-Capillary: 98 mg/dL (ref 70–99)
Glucose-Capillary: 119 mg/dL — ABNORMAL HIGH (ref 70–99)

## 2018-10-20 LAB — BASIC METABOLIC PANEL
Anion gap: 9 (ref 5–15)
BUN: 15 mg/dL (ref 8–23)
CO2: 31 mmol/L (ref 22–32)
Calcium: 8.6 mg/dL — ABNORMAL LOW (ref 8.9–10.3)
Chloride: 95 mmol/L — ABNORMAL LOW (ref 98–111)
Creatinine, Ser: 1.05 mg/dL (ref 0.61–1.24)
GFR calc Af Amer: >60 mL/min (ref >60)
GFR calc non Af Amer: >60 mL/min (ref >60)
Glucose, Bld: 96 mg/dL (ref 70–99)
Potassium: 3.7 mmol/L (ref 3.5–5.1)
Sodium: 135 mmol/L (ref 135–145)

## 2018-10-20 MED ORDER — POTASSIUM CHLORIDE CRYS ER 20 MEQ PO TBCR
20.0000 meq | EXTENDED_RELEASE_TABLET | ORAL | Status: AC
  Administered 2018-10-20 (×3): 20 meq via ORAL
  Filled 2018-10-20 (×3): qty 1

## 2018-10-20 NOTE — Plan of Care (Signed)
  Problem: Activity: Goal: Risk for activity intolerance will decrease Outcome: Progressing   Problem: Cardiac: Goal: Will achieve and/or maintain hemodynamic stability Outcome: Progressing   Problem: Clinical Measurements: Goal: Postoperative complications will be avoided or minimized Outcome: Progressing   Problem: Respiratory: Goal: Respiratory status will improve Outcome: Progressing   Problem: Skin Integrity: Goal: Wound healing without signs and symptoms of infection Outcome: Progressing Goal: Risk for impaired skin integrity will decrease Outcome: Progressing   Problem: Urinary Elimination: Goal: Ability to achieve and maintain adequate renal perfusion and functioning will improve Outcome: Progressing   Problem: Education: Goal: Knowledge of General Education information will improve Description: Including pain rating scale, medication(s)/side effects and non-pharmacologic comfort measures Outcome: Progressing   Problem: Health Behavior/Discharge Planning: Goal: Ability to manage health-related needs will improve Outcome: Progressing   Problem: Clinical Measurements: Goal: Ability to maintain clinical measurements within normal limits will improve Outcome: Progressing Goal: Will remain free from infection Outcome: Progressing Goal: Diagnostic test results will improve Outcome: Progressing Goal: Respiratory complications will improve Outcome: Progressing Goal: Cardiovascular complication will be avoided Outcome: Progressing   Problem: Activity: Goal: Risk for activity intolerance will decrease Outcome: Progressing   Problem: Nutrition: Goal: Adequate nutrition will be maintained Outcome: Progressing   Problem: Coping: Goal: Level of anxiety will decrease Outcome: Progressing   Problem: Elimination: Goal: Will not experience complications related to bowel motility Outcome: Progressing Goal: Will not experience complications related to urinary  retention Outcome: Progressing   Problem: Pain Managment: Goal: General experience of comfort will improve Outcome: Progressing   Problem: Safety: Goal: Ability to remain free from injury will improve Outcome: Progressing   Problem: Skin Integrity: Goal: Risk for impaired skin integrity will decrease Outcome: Progressing   

## 2018-10-20 NOTE — Progress Notes (Signed)
5 Days Post-Op Procedure(s) (LRB): INSERTION OF LEFT CHEST TUBE (Left) Subjective:  Reports of some anxiety this am. Tired of being here.  Objective: Vital signs in last 24 hours: Temp:  [97.4 F (36.3 C)-98.2 F (36.8 C)] 97.5 F (36.4 C) (07/11 0751) Pulse Rate:  [72-97] 79 (07/11 0400) Cardiac Rhythm: Normal sinus rhythm (07/10 2000) Resp:  [11-21] 14 (07/11 0400) BP: (84-133)/(58-85) 122/85 (07/11 0400) SpO2:  [94 %-100 %] 98 % (07/11 0400) Weight:  [95.5 kg] 95.5 kg (07/11 0600)  Hemodynamic parameters for last 24 hours:    Intake/Output from previous day: 07/10 0701 - 07/11 0700 In: 1653.2 [P.O.:1440; I.V.:13.2; IV Piggyback:200] Out: 670 [Urine:600; Chest Tube:70] Intake/Output this shift: Total I/O In: 120 [P.O.:120] Out: -   General appearance: alert and cooperative Heart: regular rate and rhythm, S1, S2 normal, no murmur, click, rub or gallop Lungs: clear to auscultation bilaterally Wound: incisions ok Subcutaneous emphysema stable. Able to open eyes a little. nurse reports 1-2+ air leak this am but none while I was watching chest tube, even with cough.  Lab Results: Recent Labs    10/18/18 0226 10/20/18 0246  WBC 10.3 8.5  HGB 10.9* 10.9*  HCT 33.6* 33.9*  PLT 286 323   BMET:  Recent Labs    10/18/18 0226 10/20/18 0246  NA 133* 135  K 3.8 3.7  CL 95* 95*  CO2 28 31  GLUCOSE 95 96  BUN 21 15  CREATININE 1.07 1.05  CALCIUM 8.5* 8.6*    PT/INR: No results for input(s): LABPROT, INR in the last 72 hours. ABG    Component Value Date/Time   PHART 7.376 10/05/2018 1043   HCO3 24.2 10/05/2018 1043   TCO2 24 10/04/2018 1949   ACIDBASEDEF 0.3 10/05/2018 1043   O2SAT 97.9 10/05/2018 1043   CBG (last 3)  Recent Labs    10/19/18 1622 10/19/18 2056 10/20/18 0617  GLUCAP 107* 99 92    Assessment/Plan:  Small air leak persists. There is only a small intermittent air leak from the chest tube which is basilar but I think most of air is  probably coming from the anterior lung and escaping through sternum into subcutaneous tissue. The subQ air looks stable so hopefully this will stop soon. Would continue chest tube to suction for now and follow. Encourage nutrition. Otherwise he has been very stable.   LOS: 16 days    Gaye Pollack 10/20/2018

## 2018-10-20 NOTE — Progress Notes (Signed)
Patient ID: Melvin Brewer, male   DOB: 1950/06/01, 68 y.o.   MRN: [id number]  TCTS Evening Rounds:  Hemodynamically stable  Up in chair.  No air leak from chest tube.  Subcutaneous air in face looks stable. Can barely open eyes.

## 2018-10-21 ENCOUNTER — Inpatient Hospital Stay (HOSPITAL_COMMUNITY): Payer: Medicare Other

## 2018-10-21 LAB — GLUCOSE, CAPILLARY
Glucose-Capillary: 104 mg/dL — ABNORMAL HIGH (ref 70–99)
Glucose-Capillary: 119 mg/dL — ABNORMAL HIGH (ref 70–99)
Glucose-Capillary: 124 mg/dL — ABNORMAL HIGH (ref 70–99)
Glucose-Capillary: 105 mg/dL — ABNORMAL HIGH (ref 70–99)

## 2018-10-21 MED ORDER — BUDESONIDE-FORMOTEROL FUMARATE 160-4.5 MCG/ACT IN AERO
2.0000 | INHALATION_SPRAY | Freq: Two times a day (BID) | RESPIRATORY_TRACT | Status: DC
  Administered 2018-10-21 – 2018-11-01 (×15): 2 via RESPIRATORY_TRACT

## 2018-10-21 MED ORDER — TIOTROPIUM BROMIDE MONOHYDRATE 2.5 MCG/ACT IN AERS: 5.0000 ug | INHALATION_SPRAY | Freq: Every day | RESPIRATORY_TRACT | Status: DC

## 2018-10-21 MED ORDER — UMECLIDINIUM BROMIDE 62.5 MCG/INH IN AEPB
1.0000 | INHALATION_SPRAY | Freq: Every day | RESPIRATORY_TRACT | Status: DC
  Administered 2018-10-22 – 2018-11-01 (×10): 1 via RESPIRATORY_TRACT
  Filled 2018-10-21 (×2): qty 7

## 2018-10-21 MED ORDER — ORAL CARE MOUTH RINSE
15.0000 mL | Freq: Two times a day (BID) | OROMUCOSAL | Status: DC
  Administered 2018-10-22 – 2018-11-01 (×15): 15 mL via OROMUCOSAL

## 2018-10-21 NOTE — Progress Notes (Signed)
6 Days Post-Op Procedure(s) (LRB): INSERTION OF LEFT CHEST TUBE (Left) Subjective:  Breathing is stable. Wants to take his Spiriva and Symbicort at 6 am together.  Objective: Vital signs in last 24 hours: Temp:  [97.7 F (36.5 C)-98.3 F (36.8 C)] 98.1 F (36.7 C) (07/12 0725) Pulse Rate:  [73-110] 75 (07/12 0400) Cardiac Rhythm: Normal sinus rhythm (07/12 0400) Resp:  [12-20] 12 (07/12 0400) BP: (96-155)/(61-84) 121/61 (07/12 0400) SpO2:  [95 %-100 %] 100 % (07/12 0400) Weight:  [96.3 kg] 96.3 kg (07/12 0500)  Hemodynamic parameters for last 24 hours:    Intake/Output from previous day: 07/11 0701 - 07/12 0700 In: 1440 [P.O.:1440] Out: 475 [Urine:425; Chest Tube:50] Intake/Output this shift: No intake/output data recorded.  General appearance: alert and cooperative Neurologic: intact Heart: regular rate and rhythm, S1, S2 normal, no murmur, click, rub or gallop Lungs: clear to auscultation bilaterally Extremities: edema moderate  Wound: chest incision ok Subcutaneous emphysema stable  Lab Results: Recent Labs    10/20/18 0246  WBC 8.5  HGB 10.9*  HCT 33.9*  PLT 323   BMET:  Recent Labs    10/20/18 0246  NA 135  K 3.7  CL 95*  CO2 31  GLUCOSE 96  BUN 15  CREATININE 1.05  CALCIUM 8.6*    PT/INR: No results for input(s): LABPROT, INR in the last 72 hours. ABG    Component Value Date/Time   PHART 7.376 10/05/2018 1043   HCO3 24.2 10/05/2018 1043   TCO2 24 10/04/2018 1949   ACIDBASEDEF 0.3 10/05/2018 1043   O2SAT 97.9 10/05/2018 1043   CBG (last 3)  Recent Labs    10/20/18 1643 10/20/18 2121 10/21/18 0704  GLUCAP 98 119* 104*   CXR: looks the same to me. Radiology thinks there may be a slight increase in right apical ptx but it is difficult to see and probably stable.  Assessment/Plan:  Persistent subcutaneous emphysema from air leak. There is no air leak from the left chest tube. The subcutaneous air is stable but not improving so I think  there is still a small air leak that is draining into the subcutaneous tissue and not out through the tube. Would continue chest tube and observe. I would expect this to stop with time. Could probably be off suction to ambulate since no air appears to be coming out through the tube.   LOS: 17 days    Gaye Pollack 10/21/2018

## 2018-10-21 NOTE — Plan of Care (Signed)
  Problem: Activity: Goal: Risk for activity intolerance will decrease Outcome: Progressing   Problem: Cardiac: Goal: Will achieve and/or maintain hemodynamic stability Outcome: Progressing   Problem: Clinical Measurements: Goal: Postoperative complications will be avoided or minimized Outcome: Progressing   Problem: Respiratory: Goal: Respiratory status will improve Outcome: Progressing   Problem: Skin Integrity: Goal: Wound healing without signs and symptoms of infection Outcome: Progressing Goal: Risk for impaired skin integrity will decrease Outcome: Progressing   Problem: Urinary Elimination: Goal: Ability to achieve and maintain adequate renal perfusion and functioning will improve Outcome: Progressing   Problem: Education: Goal: Knowledge of General Education information will improve Description: Including pain rating scale, medication(s)/side effects and non-pharmacologic comfort measures Outcome: Progressing   Problem: Health Behavior/Discharge Planning: Goal: Ability to manage health-related needs will improve Outcome: Progressing   Problem: Clinical Measurements: Goal: Ability to maintain clinical measurements within normal limits will improve Outcome: Progressing Goal: Will remain free from infection Outcome: Progressing Goal: Diagnostic test results will improve Outcome: Progressing Goal: Respiratory complications will improve Outcome: Progressing Goal: Cardiovascular complication will be avoided Outcome: Progressing   Problem: Activity: Goal: Risk for activity intolerance will decrease Outcome: Progressing   Problem: Nutrition: Goal: Adequate nutrition will be maintained Outcome: Progressing   Problem: Coping: Goal: Level of anxiety will decrease Outcome: Progressing   Problem: Elimination: Goal: Will not experience complications related to bowel motility Outcome: Progressing Goal: Will not experience complications related to urinary  retention Outcome: Progressing   Problem: Pain Managment: Goal: General experience of comfort will improve Outcome: Progressing   Problem: Safety: Goal: Ability to remain free from injury will improve Outcome: Progressing   Problem: Skin Integrity: Goal: Risk for impaired skin integrity will decrease Outcome: Progressing   

## 2018-10-21 NOTE — Progress Notes (Signed)
Patient ID: Melvin Brewer, male   DOB: 07-17-1950, 68 y.o.   MRN: [id number] TCTS Evening Rounds:  Hemodynamically stable. Subcutaneous emphysema unchanged. No air leak from chest tube.

## 2018-10-22 ENCOUNTER — Inpatient Hospital Stay (HOSPITAL_COMMUNITY): Payer: Medicare Other

## 2018-10-22 LAB — GLUCOSE, CAPILLARY
Glucose-Capillary: 101 mg/dL — ABNORMAL HIGH (ref 70–99)
Glucose-Capillary: 89 mg/dL (ref 70–99)
Glucose-Capillary: 122 mg/dL — ABNORMAL HIGH (ref 70–99)
Glucose-Capillary: 112 mg/dL — ABNORMAL HIGH (ref 70–99)

## 2018-10-22 LAB — PROTIME-INR
INR: 1.1 (ref 0.8–1.2)
Prothrombin Time: 14.4 seconds (ref 11.4–15.2)

## 2018-10-22 MED ORDER — ENSURE ENLIVE PO LIQD
237.0000 mL | Freq: Three times a day (TID) | ORAL | Status: DC
  Administered 2018-10-22 – 2018-10-31 (×20): 237 mL via ORAL

## 2018-10-22 MED ORDER — FENTANYL CITRATE (PF) 100 MCG/2ML IJ SOLN
INTRAMUSCULAR | Status: AC
  Administered 2018-10-22: 15:00:00
  Filled 2018-10-22: qty 2

## 2018-10-22 MED ORDER — SODIUM CHLORIDE 0.9% FLUSH
5.0000 mL | Freq: Three times a day (TID) | INTRAVENOUS | Status: DC
  Administered 2018-10-22 – 2018-11-01 (×19): 5 mL

## 2018-10-22 MED ORDER — FENTANYL CITRATE (PF) 100 MCG/2ML IJ SOLN
INTRAMUSCULAR | Status: AC | PRN
  Administered 2018-10-22 (×4): 25 ug via INTRAVENOUS

## 2018-10-22 MED ORDER — SODIUM CHLORIDE 0.9 % IV SOLN
INTRAVENOUS | Status: AC | PRN
  Administered 2018-10-22: 10 mL/h via INTRAVENOUS

## 2018-10-22 MED ORDER — MIDAZOLAM HCL 2 MG/2ML IJ SOLN
INTRAMUSCULAR | Status: AC | PRN
  Administered 2018-10-22: 1 mg via INTRAVENOUS
  Administered 2018-10-22 (×2): 0.5 mg via INTRAVENOUS

## 2018-10-22 MED ORDER — MIDAZOLAM HCL 2 MG/2ML IJ SOLN
INTRAMUSCULAR | Status: AC
  Administered 2018-10-22: 15:00:00
  Filled 2018-10-22: qty 2

## 2018-10-22 MED ORDER — PRO-STAT SUGAR FREE PO LIQD
30.0000 mL | Freq: Two times a day (BID) | ORAL | Status: DC
  Administered 2018-10-22 (×2): 30 mL via ORAL
  Filled 2018-10-22 (×2): qty 30

## 2018-10-22 NOTE — Consult Note (Addendum)
Chief Complaint: Patient was seen in consultation today for left pigtail chest tube placement at the request of Dr Einar Grad   Supervising Physician: Arne Cleveland  Patient Status: [facility] - In-pt  History of Present Illness: Melvin Brewer is a 68 y.o. male   Heart surgery 6 days ago-- redo CABG  Left chest tube placed in OR - Dr Nils Pyle 7/6 Sub cu air; left PTX; SOB  Has had persistent air leak---sub cu air unchanged CXR today:  IMPRESSION: 1. Persistent extensive severe subcutaneous emphysema which significantly limits evaluation of the underlying lungs. 2. Stable small left pneumothorax with chest tube in place. 3. Stable trace right basilar pneumothorax. 4. Possible multifocal patchy airspace opacities versus artifact from overlying subcutaneous emphysema.  Request for pigtail chest tube placement per Dr Prescott Gum' Feels sub cu air from "pocket that chest tube not able to reach" Dr Vernard Gambles has reviewed imaging and agreeable to proceed  Past Medical History:  Diagnosis Date   AAA (abdominal aortic aneurysm) ([place])    Abdominal aortic aneurysm (AAA) ([place])    Allergic rhinitis    Anginal pain ([place])    Centrilobular emphysema (HCC)    Cervical spine degeneration    COPD (chronic obstructive pulmonary disease) ([place])    Coronary artery disease    Diabetes mellitus without complication (HCC)    Diverticulitis    DJD (degenerative joint disease)    Eczema    GERD (gastroesophageal reflux disease)    H/O hiatal hernia    AGE 58 S  NO MEDS   Hemorrhoids    Herpes simplex infection    Hypercholesterolemia    Hyperglycemia    Hyperlipidemia    Hypertension    Obesity    Shortness of breath    W/ EXERTION     Past Surgical History:  Procedure Laterality Date   ANTERIOR CERVICAL DECOMP/DISCECTOMY FUSION N/A 06/20/2013   Procedure: CERVCIAL FIVE-SIX,CERVICAL SIX-SEVEN ANTERIOR CERVICAL DECOMPRESSION WITH FUSION INTERBODY PROSTHESIS  PLATING AND PEEK CAGE;  Surgeon: Ophelia Charter, MD;  Location: [facility];  Service: Neurosurgery;  Laterality: N/A;  anterior   CARDIAC CATHETERIZATION     [place]  2007    CHEST TUBE INSERTION Left 10/15/2018   Procedure: INSERTION OF LEFT CHEST TUBE;  Surgeon: Ivin Poot, MD;  Location: [place];  Service: Thoracic;  Laterality: Left;   COLONOSCOPY WITH PROPOFOL N/A 12/26/2017   Procedure: COLONOSCOPY WITH PROPOFOL;  Surgeon: Lollie Sails, MD;  Location: [facility] ENDOSCOPY;  Service: Endoscopy;  Laterality: N/A;   CORONARY ARTERY BYPASS GRAFT     2007  '@[facility]'$    CORONARY ARTERY BYPASS GRAFT N/A 10/04/2018   Procedure: REDO CORONARY ARTERY BYPASS GRAFTING (CABG) x 1, SVG TO LAD, USING LEFT GREATER SAPHENOUS VEIN HARVESTED ENDOSCOPICALLY;  Surgeon: Ivin Poot, MD;  Location: [place];  Service: Open Heart Surgery;  Laterality: N/A;   EYE SURGERY     RT EYE LASER (RET DETACHMENT)   RIGHT/LEFT HEART CATH AND CORONARY/GRAFT ANGIOGRAPHY Left 09/05/2018   Procedure: RIGHT/LEFT HEART CATH AND CORONARY/GRAFT ANGIOGRAPHY;  Surgeon: Yolonda Kida, MD;  Location: [place] CV [facility];  Service: Cardiovascular;  Laterality: Left;   TEE WITHOUT CARDIOVERSION N/A 10/04/2018   Procedure: TRANSESOPHAGEAL ECHOCARDIOGRAM (TEE);  Surgeon: Prescott Gum, Collier Salina, MD;  Location: [place];  Service: Open Heart Surgery;  Laterality: N/A;   TONSILLECTOMY      Allergies: Atorvastatin and Sulfa antibiotics  Medications: Prior to Admission medications   Medication Sig Start  Date End Date Taking? Authorizing Provider  albuterol (PROVENTIL HFA;VENTOLIN HFA) 108 (90 BASE) MCG/ACT inhaler Inhale 2 puffs into the lungs every 6 (six) hours as needed for wheezing or shortness of breath.   Yes [provider]  albuterol (PROVENTIL) (2.5 MG/3ML) 0.083% nebulizer solution Inhale 2.5 mg into the lungs every 6 (six) hours as needed for wheezing or shortness of breath. 04/27/18  Yes [provider]   aspirin EC 81 MG tablet Take 81 mg by mouth every evening.    Yes [provider]  azelastine (ASTELIN) 0.1 % nasal spray Place 1 spray into both nostrils 2 (two) times daily. Use in each nostril as directed   Yes [provider]  azithromycin (ZITHROMAX) 250 MG tablet Take 250 mg by mouth every Monday, Wednesday, and Friday.   Yes [provider]  Cholecalciferol (VITAMIN D3) 50 MCG (2000 UT) TABS Take 2,000 Units by mouth every morning.    Yes [provider]  fexofenadine (ALLEGRA) 180 MG tablet Take 180 mg by mouth daily. 05/27/18  Yes [provider]  fluticasone (FLONASE) 50 MCG/ACT nasal spray Place 1 spray into both nostrils daily.   Yes [provider]  losartan (COZAAR) 25 MG tablet Take 25 mg by mouth daily. 07/08/18  Yes [provider]  metFORMIN (GLUCOPHAGE) 500 MG tablet Take 500 mg by mouth 2 (two) times a day.  05/04/17  Yes [provider]  metoprolol (LOPRESSOR) 50 MG tablet Take 50 mg by mouth 2 (two) times daily.   Yes [provider]  montelukast (SINGULAIR) 10 MG tablet Take 10 mg by mouth every evening. 07/02/18  Yes [provider]  rosuvastatin (CRESTOR) 40 MG tablet Take 40 mg by mouth daily.   Yes [provider]  SYMBICORT 160-4.5 MCG/ACT inhaler Inhale 2 puffs into the lungs 2 (two) times a day. 05/07/18  Yes [provider]  Tiotropium Bromide Monohydrate (SPIRIVA RESPIMAT) 2.5 MCG/ACT AERS Inhale 2 puffs into the lungs daily.  03/06/17  Yes [provider]  valACYclovir (VALTREX) 500 MG tablet Take 500 mg by mouth every evening. 06/13/18  Yes [provider]  DENTA 5000 PLUS 1.1 % CREA dental cream Take 1 application by mouth at bedtime. Brush teeth for at least 1 min 05/09/17   [provider]  sildenafil (REVATIO) 20 MG tablet Take 20-100 mg by mouth daily as needed for erectile dysfunction. 12/07/17   [provider]     History  reviewed. No pertinent family history.  Social History   Socioeconomic History   Marital status: Married    Spouse name: Not on file   Number of children: Not on file   Years of education: Not on file   Highest education level: Not on file  Occupational History   Not on file  Social Needs   Financial resource strain: Not on file   Food insecurity    Worry: Not on file    Inability: Not on file   Transportation needs    Medical: Not on file    Non-medical: Not on file  Tobacco Use   Smoking status: Former Smoker    Packs/day: 2.00    Years: 43.00    Pack years: 86.00    Types: Cigarettes    Quit date: 04/11/2004    Years since quitting: 14.5   Smokeless tobacco: Never Used  Substance and Sexual Activity   Alcohol use: Yes    Alcohol/week: 3.0 - 4.0 standard drinks  Types: 3 - 4 Standard drinks or equivalent per week   Drug use: No   Sexual activity: Not on file  Lifestyle   Physical activity    Days per week: Not on file    Minutes per session: Not on file   Stress: Not on file  Relationships   Social connections    Talks on phone: Not on file    Gets together: Not on file    Attends religious service: Not on file    Active member of club or organization: Not on file    Attends meetings of clubs or organizations: Not on file    Relationship status: Not on file  Other Topics Concern   Not on file  Social History Narrative   Not on file    Review of Systems: A 12 point ROS discussed and pertinent positives are indicated in the HPI above.  All other systems are negative.  Review of Systems  Constitutional: Positive for activity change and appetite change. Negative for fever.  Respiratory: Positive for shortness of breath.   Psychiatric/Behavioral: Negative for behavioral problems and confusion.    Vital Signs: BP 139/79    Pulse 83    Temp 97.8 F (36.6 C) (Oral)    Resp 13    Ht 5' 9"  (1.753 m)    Wt 214 lb 1.1 oz (97.1 kg)    SpO2 98%     BMI 31.61 kg/m   Physical Exam Vitals signs reviewed.  HENT:     Head:     Comments: Extensive Sub cu air face/neck/chest Cardiovascular:     Rate and Rhythm: Normal rate.  Pulmonary:     Breath sounds: Wheezing present.  Skin:    General: Skin is warm.  Neurological:     Mental Status: He is oriented to person, place, and time.  Psychiatric:        Mood and Affect: Mood normal.        Behavior: Behavior normal.        Thought Content: Thought content normal.        Judgment: Judgment normal.     Comments: Dr Darcey Nora in room exchanging exsisting left chest tube-- spoke to pt  I also spoke to wife via phone     Imaging: Dg Chest 1 View  Result Date: 10/15/2018 CLINICAL DATA:  Left pneumothorax.  Subcutaneous emphysema. EXAM: CHEST  1 VIEW 10:36 a.m. COMPARISON:  10/15/2018 at 5:07 a.m. and 10/14/2018 and 10/13/2018 FINDINGS: New left chest tube has been inserted. There is no definable left pneumothorax. Pneumomediastinum is present. Minimal atelectasis at the left lung base, improved minimal right base atelectasis, unchanged. Extensive bilateral subcutaneous emphysema is present. Heart size and pulmonary vascularity are normal. IMPRESSION: New left-sided chest tube in place no visible residual left pneumothorax. Unchanged pneumomediastinum and subcutaneous emphysema. Electronically Signed   By: Lorriane Shire M.D.   On: 10/15/2018 11:08   Dg Chest 2 View  Result Date: 10/08/2018 CLINICAL DATA:  History of CABG. EXAM: CHEST - 2 VIEW COMPARISON:  10/07/2018. FINDINGS: Interval removal bilateral chest tubes and right IJ line. Small left pneumothorax noted. Tiny right base pneumothorax. Interim near complete clearing of bibasilar atelectasis/infiltrates. Mediastinum hilar structures normal. Prior CABG. Stable cardiomegaly. Prior cervicothoracic spine fusion. IMPRESSION: 1. Interim removal of bilateral chest tubes and right IJ line. Small left pneumothorax noted on today's exam. Tiny right  base pneumothorax noted. 2.  Prior CABG.  Stable cardiomegaly. 3. Interim near complete clearing of bibasilar  atelectasis/infiltrates. Critical Value/emergent results were called by telephone at the time of interpretation on 10/08/2018 at 7:10 am to nurse Ron Agee , who verbally acknowledged these results. Electronically Signed   By: Marcello Moores  Register   On: 10/08/2018 07:11   Dg Chest 2 View  Result Date: 10/01/2018 CLINICAL DATA:  Preoperative evaluation for CABG EXAM: CHEST - 2 VIEW COMPARISON:  06/20/2013 FINDINGS: Normal heart size post median sternotomy and CABG. Mediastinal contours and pulmonary vascularity normal. Atherosclerotic calcification aorta. Lungs clear. No pulmonary infiltrate, pleural effusion, or pneumothorax. Pleural thickening at the lateral lower LEFT chest unchanged, corresponds to subpleural fat on a prior CT. Prior cervicothoracic fusion. No acute osseous findings. IMPRESSION: Post CABG. No acute abnormalities. Electronically Signed   By: Lavonia Dana M.D.   On: 10/01/2018 14:02   Dg Chest Port 1 View  Result Date: 10/22/2018 CLINICAL DATA:  68 year old male with left-sided pneumothorax, chest tube in place EXAM: PORTABLE CHEST 1 VIEW COMPARISON:  Prior chest x-ray 10/21/2018 FINDINGS: Stable left-sided pneumothorax. Left basilar chest tube remains in good position. Tiny right-sided pneumothorax also remains unchanged. Extensive subcutaneous emphysema throughout all of the visualized soft tissues. Patient is status post median sternotomy with evidence of multivessel CABG. Cardiac and mediastinal contours remain unchanged. Atherosclerotic calcifications visualized in the thoracic aorta. The underlying lungs are difficult to assess given the severity of overlying subcutaneous emphysema. There may be scattered areas of patchy airspace opacification. IMPRESSION: 1. Persistent extensive severe subcutaneous emphysema which significantly limits evaluation of the underlying lungs. 2. Stable small  left pneumothorax with chest tube in place. 3. Stable trace right basilar pneumothorax. 4. Possible multifocal patchy airspace opacities versus artifact from overlying subcutaneous emphysema. Electronically Signed   By: Jacqulynn Cadet M.D.   On: 10/22/2018 07:56   Dg Chest Port 1 View  Result Date: 10/21/2018 CLINICAL DATA:  Follow-up pneumothorax and chest tube. EXAM: PORTABLE CHEST 1 VIEW COMPARISON:  10/20/2018 and multiple earlier exams. FINDINGS: Right-sided pneumothorax has mildly increased in size from the previous day's study. Top of the partly collapsed right lung is now separated from the a apical parietal pleura by 3.7 cm, previously 3.1 cm. Small left pneumothorax is stable from the previous day's exam. No change in the position of the left inferior hemithorax chest tube. There are opacities at the lung bases consistent with atelectasis. Extensive subcutaneous emphysema is noted throughout the chest extending to the neck base, similar to the previous day's exam. IMPRESSION: 1. Mild interval increase in the size of the right pneumothorax from the previous day's exam. 2. Stable, small, left pneumothorax. No change in the position of the left chest tube. 3. Stable extensive subcutaneous emphysema. Electronically Signed   By: Lajean Manes M.D.   On: 10/21/2018 05:46   Dg Chest Port 1 View  Result Date: 10/20/2018 CLINICAL DATA:  Follow-up pneumothorax. EXAM: PORTABLE CHEST 1 VIEW COMPARISON:  10/19/2018 and older exams. FINDINGS: Small left pneumothorax is stable from the most recent prior exam. There is also a small right pneumothorax, estimated at 10-15%. Left chest tube along the inferior left hemithorax is unchanged. There is associated left lung base opacity consistent with atelectasis. Extensive bilateral subcutaneous emphysema is stable from the most recent prior exam. IMPRESSION: 1. Small right pneumothorax, estimated 10-15%, is now evident. 2. No change in the size of the small left  pneumothorax or in the position of the left-sided chest tube. 3. No change in the extensive subcutaneous emphysema. Electronically Signed   By: Dedra Skeens.D.  On: 10/20/2018 06:20   Dg Chest Port 1 View  Result Date: 10/19/2018 CLINICAL DATA:  68 year old male with a history of coronary artery disease, redo sternotomy and redo CABG 10/04/2018 with baseline COPD and subsequent pneumothorax requiring chest tube placement. EXAM: PORTABLE CHEST 1 VIEW COMPARISON:  10/18/2018, 10/17/2018, 10/16/2018, 10/15/2018 FINDINGS: Cardiomediastinal silhouette unchanged in size and contour. Surgical changes of median sternotomy and CABG. Persisting small left pneumothorax with unchanged position of the left thoracostomy tube. No visualized right pneumothorax. Extensive subcutaneous/myofacial, and mediastinal gas is unchanged from the prior chest x-ray. Patchy opacities within the a lungs forearm, unchanged. IMPRESSION: Persisting small left pneumothorax with unchanged thoracostomy tube. Redemonstration of subcutaneous, myofacial, mediastinal gas. Surgical changes of median sternotomy and CABG. No visualized right pneumothorax. Electronically Signed   By: Corrie Mckusick D.O.   On: 10/19/2018 07:33   Dg Chest Port 1 View  Result Date: 10/18/2018 CLINICAL DATA:  Chest tube.  Pneumothorax. EXAM: PORTABLE CHEST 1 VIEW COMPARISON:  10/17/2018. FINDINGS: Left chest tube in stable position. Tiny left apical pneumothorax again can not be excluded. Stable pneumomediastinum. Prior CABG. Heart size normal. No focal infiltrate. Mild left base atelectasis. No pleural effusion or pneumothorax. Diffuse bilateral chest wall severe subcutaneous emphysema again noted. Prior cervical spine fusion. IMPRESSION: 1. Left chest tube in stable position. Tiny left apical pneumothorax again can not be excluded. Stable pneumomediastinum. 2.  Mild left base atelectasis. 3.  Prior CABG.  Heart size stable. Electronically Signed   By: Marcello Moores  Register    On: 10/18/2018 06:54   Dg Chest Port 1 View  Result Date: 10/17/2018 CLINICAL DATA:  Pneumothorax, chest tube placement. EXAM: PORTABLE CHEST 1 VIEW COMPARISON:  Radiograph of October 16, 2018. FINDINGS: Stable pneumomediastinum. Sternotomy wires are noted. Stable left basilar chest tube is noted. Probable mild left apical pneumothorax is noted. Stable extensive subcutaneous emphysema is noted bilaterally. Stable bibasilar atelectasis. IMPRESSION: Stable pneumomediastinum. Stable left basilar chest tube is noted with probable mild left apical pneumothorax. Stable extensive subcutaneous emphysema is noted bilaterally. Electronically Signed   By: Marijo Conception M.D.   On: 10/17/2018 07:40   Dg Chest Port 1 View  Result Date: 10/16/2018 CLINICAL DATA:  Chest tube. EXAM: PORTABLE CHEST 1 VIEW COMPARISON:  10/15/2018. FINDINGS: Left chest tube in stable position. Small left pneumothorax is again noted on today's exam. Pneumomediastinum. Bibasilar atelectasis. Prior CABG. Heart size stable. Prior cervicothoracic spine fusion. Diffuse severe bilateral chest wall and neck subcutaneous emphysema is again noted. IMPRESSION: 1. Left chest tube in stable position. Tiny left base pneumothorax again noted on today's exam. Pneumomediastinum unchanged. Diffuse chest wall and neck subcutaneous emphysema unchanged. 2.  Bibasilar atelectasis. 3.  Prior CABG.  Heart size stable. Electronically Signed   By: Marcello Moores  Register   On: 10/16/2018 07:52   Dg Chest Port 1 View  Result Date: 10/15/2018 CLINICAL DATA:  CABG 10/04/2018 EXAM: PORTABLE CHEST 1 VIEW COMPARISON:  One-view chest x-ray 10/14/2018 FINDINGS: Left pneumothorax is increasing in size, now seen over the lower lobes. Left-sided chest tube is stable. Heart size is normal. Atherosclerotic calcifications are again noted at the aorta. Extensive subcutaneous emphysema is again seen. IMPRESSION: 1. Left pneumothorax visualized over the lower lobes. 2. Left-sided chest tube  stable in position. 3. Stable diffuse subcutaneous emphysema. Electronically Signed   By: San Morelle M.D.   On: 10/15/2018 06:15   Dg Chest Port 1 View  Result Date: 10/14/2018 CLINICAL DATA:  Status post bypass surgery. EXAM: PORTABLE CHEST  1 VIEW COMPARISON:  Multiple prior chest films. FINDINGS: The cardiac silhouette, mediastinal and hilar contours are stable. The left-sided chest tube is unchanged. Stable pneumomediastinum and very small left apical pneumothorax. Stable diffuse subcutaneous emphysema. Slight improved left basilar atelectasis. No definite pleural effusions. IMPRESSION: 1. Stable left-sided chest tube with very small apically all left pneumothorax. 2. Stable pneumomediastinum and extensive subcutaneous emphysema. 3. Slight improved left basilar atelectasis. Electronically Signed   By: Marijo Sanes M.D.   On: 10/14/2018 09:43   Dg Chest Port 1 View  Result Date: 10/13/2018 CLINICAL DATA:  Left chest tube with air leak. EXAM: PORTABLE CHEST 1 VIEW COMPARISON:  10/12/2018 FINDINGS: Similar appearance. Left chest tube remains in place. Widespread emphysematous change throughout the chest. Pneumomediastinum. I cannot identify definite pleural space air given these findings. There is atelectasis at the left lower lobe. IMPRESSION: Similar appearance to yesterday. Widespread subcutaneous and thoracic emphysema. Pneumomediastinum. No definite pleural air visible, but not excluded. Some atelectasis in the left lower lobe. Electronically Signed   By: Nelson Chimes M.D.   On: 10/13/2018 08:43   Dg Chest Port 1 View  Result Date: 10/12/2018 CLINICAL DATA:  History of CABG EXAM: PORTABLE CHEST 1 VIEW COMPARISON:  October 11, 2018 FINDINGS: The heart size and mediastinal contours are stable. Extensive subcutaneous emphysema is identified over bilateral chest and neck limiting evaluation of underlying lung field. There is probably air surrounding the aortic knob of unchanged. A left chest tube is  identified unchanged. There is probably patchy consolidation of left lung base. The visualized skeletal structures are stable. IMPRESSION: Evaluation of the lungs are very limited due to extensive overlying subcutaneous emphysema over bilateral chest and neck. There is probably consolidation of the left lung base. There is probably air surrounding the aortic knob unchanged compared to prior exam. Electronically Signed   By: Abelardo Diesel M.D.   On: 10/12/2018 09:54   Dg Chest Port 1 View  Result Date: 10/11/2018 CLINICAL DATA:  68 y/o M; no pneumothorax. Increased swelling and shortness of breath. EXAM: PORTABLE CHEST 1 VIEW COMPARISON:  10/10/2018 chest radiograph FINDINGS: Stable cardiac silhouette post median sternotomy. Stable pneumomediastinum. Diffuse subcutaneous pneumatosis partially obscures the lungs. Right-sided pneumothorax appears grossly stable from the prior chest radiograph. Left-sided chest tube is stable in position. The left-sided pneumothorax appears mildly increased in size at the left costal diaphragmatic angle. No midline shift of mediastinum. No acute osseous abnormality is evident. IMPRESSION: 1. Extensive subcutaneous emphysema partially obscures the lungs. 2. Stable pneumoperitoneum and grossly stable small right pneumothorax. 3. Stable left-sided chest tube. Left pneumothorax appears mildly increased at the left costal diaphragmatic angle. Electronically Signed   By: Kristine Garbe M.D.   On: 10/11/2018 02:31   Dg Chest Port 1 View  Result Date: 10/10/2018 CLINICAL DATA:  Chest tube placement. EXAM: PORTABLE CHEST 1 VIEW COMPARISON:  Radiograph of October 09, 2018. FINDINGS: Stable pneumomediastinum is noted. Status post coronary bypass graft. Minimal right apical pneumothorax is noted which is decreased compared to prior exam. Stable large amount of subcutaneous emphysema is seen bilaterally. Bony thorax is unremarkable. IMPRESSION: Stable pneumomediastinum. Minimal right  apical pneumothorax is noted which is decreased compared to prior exam. Stable extensive bilateral subcutaneous emphysema is noted Electronically Signed   By: Marijo Conception M.D.   On: 10/10/2018 08:59   Dg Chest Port 1 View  Result Date: 10/09/2018 CLINICAL DATA:  Status post chest tube manipulation EXAM: PORTABLE CHEST 1 VIEW COMPARISON:  10/09/2018 FINDINGS:  Cardiac shadow is stable. Postsurgical changes are again noted. Severe subcutaneous emphysema is again identified. Left-sided chest tube is again seen and stable. Minimal pneumomediastinum remains. No definitive left pneumothorax is seen. Small right pneumothorax is again identified and stable. IMPRESSION: Stable right pneumothorax. No definitive left pneumothorax is seen at this time although considerable overlying markings are noted. Mild pneumomediastinum remains. Electronically Signed   By: Inez Catalina M.D.   On: 10/09/2018 12:58   Dg Chest Port 1 View  Result Date: 10/09/2018 CLINICAL DATA:  Pneumothorax, chest tube EXAM: PORTABLE CHEST 1 VIEW COMPARISON:  Portable exam 0811 hours compared to 10/08/2018 FINDINGS: LEFT thoracostomy tube stable. Normal heart size post CABG. Pulmonary vascularity normal. Pneumomediastinum again identified with gas extending into the cervical region. Significant chest wall emphysema. Minimal loculated pneumothorax along the LEFT lateral chest wall. Small RIGHT pneumothorax at apex and at lateral base. Bibasilar atelectasis. Bones demineralized. IMPRESSION: Minimal LEFT pneumothorax despite thoracostomy tube. Persistent pneumomediastinum, slightly increased. Small RIGHT pneumothorax identified at RIGHT apex and lateral RIGHT lung base, not well seen on prior study but grossly unchanged from an earlier study of 10/08/2018. Findings called to [facility] on [facility] on 10/09/2018 at 0829 hrs. Electronically Signed   By: Lavonia Dana M.D.   On: 10/09/2018 08:30   Dg Chest Port 1 View  Result Date: 10/08/2018 CLINICAL  DATA:  67 year old male chest tube placement. Bilateral pneumothoraces. Postoperative day 4 status post CABG. EXAM: PORTABLE CHEST 1 VIEW COMPARISON:  1539 hours today. FINDINGS: Portable AP semi upright view at 1740 hours. Stable left chest tube. No left pneumothorax identified. Small right apically pneumothorax visible between the posterior 2nd and 3rd ribs appears stable. Extensive chest and visible neck subcutaneous emphysema. Stable cardiac size and mediastinal contours. No pulmonary edema or pleural effusion. Prior cervical ACDF. Paucity of bowel gas in the upper abdomen. IMPRESSION: 1. Stable left chest tube. No left pneumothorax identified. Stable small right apical pneumothorax. 2. Extensive subcutaneous emphysema. 3. No new cardiopulmonary abnormality. Electronically Signed   By: Genevie Ann M.D.   On: 10/08/2018 17:57   Dg Chest Port 1 View  Result Date: 10/08/2018 CLINICAL DATA:  Check chest tube EXAM: PORTABLE CHEST 1 VIEW COMPARISON:  10/08/2018 FINDINGS: Chest tube is been placed on the left with resolution of previously seen pneumothorax. Subcutaneous emphysema is again noted. No new focal infiltrate is seen. The right-sided pneumothorax seen previously has reduced somewhat in the interval from the prior exam. No new focal abnormality is noted. IMPRESSION: Resolution of left-sided pneumothorax following chest tube. Right-sided pneumothorax has decreased in size as well. Electronically Signed   By: Inez Catalina M.D.   On: 10/08/2018 15:50   Dg Chest Port 1 View  Result Date: 10/08/2018 CLINICAL DATA:  Difficulty breathing.  Wheezing.  Diaphoresis. EXAM: PORTABLE CHEST 1 VIEW COMPARISON:  10/08/2018. FINDINGS: Prior CABG. Cardiomegaly with normal pulmonary vascularity. Bilateral pneumothoraces are again noted. These may have worsened slightly. Basilar atelectasis. IMPRESSION: 1. Bilateral pneumothoraces again noted. These may have worsened slightly. 2.  Basilar atelectasis. 3.  Prior CABG.  Stable  cardiomegaly. Electronically Signed   By: Marcello Moores  Register   On: 10/08/2018 13:48   Dg Chest Port 1 View  Result Date: 10/07/2018 CLINICAL DATA:  Recent coronary artery bypass grafting. Coronary artery disease EXAM: PORTABLE CHEST 1 VIEW COMPARISON:  October 06, 2018 FINDINGS: There remain bilateral chest tubes, unchanged in position. Cordis tip is in the superior vena cava. No pneumothorax evident. There are small pleural effusions  bilaterally with bibasilar atelectasis. No new opacity evident. Heart is mildly enlarged with pulmonary vascularity normal. No adenopathy. Status post coronary artery bypass grafting. IMPRESSION: Small pleural effusions bilaterally with bibasilar atelectasis. No new opacity. Stable cardiac silhouette. Tube and catheter positions unchanged without evident pneumothorax. Electronically Signed   By: Lowella Grip III M.D.   On: 10/07/2018 08:06   Dg Chest Port 1 View  Result Date: 10/06/2018 CLINICAL DATA:  CABG EXAM: PORTABLE CHEST 1 VIEW COMPARISON:  Yesterday FINDINGS: Swan-Ganz catheter has been removed. Stable sheath and chest tube positioning. Increased atelectasis. Stable postoperative heart size. No visible effusion or pneumothorax. IMPRESSION: 1. Mildly increased atelectasis. 2. No visible pneumothorax. Electronically Signed   By: Monte Fantasia M.D.   On: 10/06/2018 08:41   Dg Chest Port 1 View  Result Date: 10/05/2018 CLINICAL DATA:  CABG.  Chest tube. EXAM: PORTABLE CHEST 1 VIEW COMPARISON:  10/04/2018 FINDINGS: Endotracheal tube removed. Swan-Ganz catheter tip remains in the right pulmonary artery. Bilateral chest tubes in place. No pneumothorax. Lungs are well aerated with slight atelectasis in the bases. No edema or effusion IMPRESSION: Mild bibasilar atelectasis following extubation.  No pneumothorax. Electronically Signed   By: Franchot Gallo M.D.   On: 10/05/2018 08:12   Dg Chest Portable 1 View  Result Date: 10/04/2018 CLINICAL DATA:  Status post CABG  EXAM: PORTABLE CHEST 1 VIEW COMPARISON:  10/01/2018 FINDINGS: Surgical hardware in the cervical spine. Endotracheal tube tip is about 3.4 cm superior to the carina. Bilateral chest drainage catheters. Ascending catheter over the mediastinum presumably mediastinal drain. Right IJ Swan-Ganz catheter tip overlies the pulmonary outflow tract. Post sternotomy changes. Low lung volumes. Minimal left basilar atelectasis. Prominent mediastinal silhouette, likely due to portable technique and recent surgery. No pneumothorax. Slight vascular congestion. IMPRESSION: 1. Placement of support lines and tubes as above. Post sternotomy changes. 2. Low lung volumes. Slight vascular congestion and minimal left basilar atelectasis. Electronically Signed   By: Donavan Foil M.D.   On: 10/04/2018 16:17    Labs:  CBC: Recent Labs    10/16/18 0253 10/17/18 0238 10/18/18 0226 10/20/18 0246  WBC 12.4* 12.5* 10.3 8.5  HGB 11.5* 10.6* 10.9* 10.9*  HCT 36.1* 33.2* 33.6* 33.9*  PLT 348 272 286 323    COAGS: Recent Labs    10/01/18 1003 10/04/18 1326  INR 1.1 1.6*  APTT 24 32    BMP: Recent Labs    10/16/18 0253 10/17/18 0238 10/18/18 0226 10/20/18 0246  NA 136 137 133* 135  K 5.1 4.2 3.8 3.7  CL 96* 97* 95* 95*  CO2 31 28 28 31   GLUCOSE 114* 96 95 96  BUN 27* 27* 21 15  CALCIUM 9.1 8.7* 8.5* 8.6*  CREATININE 1.12 1.00 1.07 1.05  GFRNONAA >60 >60 >60 >60  GFRAA >60 >60 >60 >60    LIVER FUNCTION TESTS: Recent Labs    10/01/18 1003 10/08/18 0303  BILITOT 0.6 0.6  AST 24 21  ALT 50* 22  ALKPHOS 43 33*  PROT 5.9* 5.3*  ALBUMIN 3.7 2.7*    TUMOR MARKERS: No results for input(s): AFPTM, CEA, CA199, CHROMGRNA in the last 8760 hours.  Assessment and Plan:  Post redo CABG 10/15/18 Post procedure Left PTX and chest tube placed TCTS Sub cu air persistent Need for left chest tube placement in IR per Dr Prescott Gum Scheduled for procedure now Risks and benefits discussed with the patient and  his wife via phone including bleeding, infection, damage to adjacent structures,  and sepsis.  All of the patient's questions were answered, patient is agreeable to proceed. Consent signed and in chart.  Thank you for this interesting consult.  I greatly enjoyed meeting BUZZ AXEL and look forward to participating in their care.  A copy of this report was sent to the requesting provider on this date.  Electronically Signed: Lavonia Drafts, PA-C 10/22/2018, 9:21 AM   I spent a total of 40 Minutes    in face to face in clinical consultation, greater than 50% of which was counseling/coordinating care for left pigtail chest tube placement

## 2018-10-22 NOTE — Procedures (Signed)
  Procedure: CT left chest tube  12f lateral EBL:   minimal Complications:  none immediate  See full dictation in [facility].  Dillard Cannon MD Main # [phone] Pager  [phone]

## 2018-10-22 NOTE — Progress Notes (Signed)
7 Days Post-Op Procedure(s) (LRB): INSERTION OF LEFT CHEST TUBE (Left) Subjective: Because of persistent extensive subcutaneous air in the neck and face I manipulated the lower right chest tube pulling it back 2 cm with expression of air.  I also had interventional radiology place a 14 French pigtail catheter anteriorly in the left pleural space to help remove any loculated air and to help reduce the subcutaneous facial and neck air.  He tolerated the procedures and the subcutaneous air is slightly improved. Sternal drainage has resolved. Maintaining sinus rhythm.  Objective: Vital signs in last 24 hours: Temp:  [97.8 F (36.6 C)-98.2 F (36.8 C)] 97.9 F (36.6 C) (07/13 1607) Pulse Rate:  [76-114] 114 (07/13 1403) Cardiac Rhythm: Normal sinus rhythm (07/13 1400) Resp:  [12-20] 16 (07/13 1403) BP: (121-174)/(63-97) 121/63 (07/13 1403) SpO2:  [94 %-100 %] 100 % (07/13 1403) FiO2 (%):  [5 %] 5 % (07/13 1315) Weight:  [97.1 kg] 97.1 kg (07/13 0500)  Hemodynamic parameters for last 24 hours:  Afebrile with O2 sat 97% on nasal cannula Intake/Output from previous day: 07/12 0701 - 07/13 0700 In: 840 [P.O.:840] Out: 675 [Urine:625; Chest Tube:50] Intake/Output this shift: No intake/output data recorded.  Severe facial and neck subcutaneous emphysema Neuro intact Heart rate regular Minimal peripheral edema  Lab Results: Recent Labs    10/20/18 0246  WBC 8.5  HGB 10.9*  HCT 33.9*  PLT 323   BMET:  Recent Labs    10/20/18 0246  NA 135  K 3.7  CL 95*  CO2 31  GLUCOSE 96  BUN 15  CREATININE 1.05  CALCIUM 8.6*    PT/INR:  Recent Labs    10/22/18 0940  LABPROT 14.4  INR 1.1   ABG    Component Value Date/Time   PHART 7.376 10/05/2018 1043   HCO3 24.2 10/05/2018 1043   TCO2 24 10/04/2018 1949   ACIDBASEDEF 0.3 10/05/2018 1043   O2SAT 97.9 10/05/2018 1043   CBG (last 3)  Recent Labs    10/22/18 0616 10/22/18 1132 10/22/18 1604  GLUCAP 101* 89 122*     Assessment/Plan: S/P Procedure(s) (LRB): INSERTION OF LEFT CHEST TUBE (Left) Continue suction to both chest tubes-basilar and anterior left sided drains.   LOS: 18 days    [name] 10/22/2018

## 2018-10-22 NOTE — Sedation Documentation (Signed)
Pt arrived with RN and RT. ICU RN states that she and RT will stay with pt during procedure per Dr Prescott Gum request. Pt denies pain or discomfort at this time, vitals are stable

## 2018-10-22 NOTE — Progress Notes (Signed)
Patient ID: Melvin Brewer, male   DOB: [birth year], 68 y.o.   MRN: [id number] EVENING ROUNDS NOTE :     [address]       [place] [address]             [phone]                 7 Days Post-Op Procedure(s) (LRB): INSERTION OF LEFT CHEST TUBE (Left)  Total Length of Stay:  LOS: 18 days  BP 121/63   Pulse (!) 114   Temp 98.2 F (36.8 C) (Oral)   Resp 16   Ht 5\' 9"  (1.753 m)   Wt 97.1 kg   SpO2 100%   BMI 31.61 kg/m   .Intake/Output      07/12 0701 - 07/13 0700 07/13 0701 - 07/14 0700   P.O. 840    Total Intake(mL/kg) 840 (8.7)    Urine (mL/kg/hr) 625 (0.3)    Stool 0    Chest Tube 50    Total Output 675    Net +165         Urine Occurrence 1 x    Stool Occurrence 1 x      . sodium chloride Stopped (10/19/18 2331)  . lactated ringers       Lab Results  Component Value Date   WBC 8.5 10/20/2018   HGB 10.9 (L) 10/20/2018   HCT 33.9 (L) 10/20/2018   PLT 323 10/20/2018   GLUCOSE 96 10/20/2018   ALT 22 10/08/2018   AST 21 10/08/2018   NA 135 10/20/2018   K 3.7 10/20/2018   CL 95 (L) 10/20/2018   CREATININE 1.05 10/20/2018   BUN 15 10/20/2018   CO2 31 10/20/2018   INR 1.1 10/22/2018   HGBA1C 6.2 (H) 10/01/2018    New left chest tube placed today- follow up chest xray pending, placed in IR  Ct Chest Wo Contrast  Result Date: 10/22/2018 CLINICAL DATA:  Known pneumothorax for follow-up.  CABG 10/04/2018. EXAM: CT CHEST WITHOUT CONTRAST TECHNIQUE: Multidetector CT imaging of the chest was performed following the standard protocol without IV contrast. COMPARISON:  CT 09/12/2018 and chest x-ray today. FINDINGS: Cardiovascular: Heart is normal size. Evidence of previous CABG. Thoracic aorta is normal in caliber. Mild calcified plaque involving the thoracic aorta. Mediastinum/Nodes: No pneumomediastinum. No mediastinal or hilar adenopathy. Lungs/Pleura: Known small bilateral pneumothoraces are present. Left-sided chest tube is present with tip  over the posterior left lung base adjacent the diaphragm. Posterior bibasilar atelectasis present. There is atelectasis over the lingula adjacent the left-sided chest tube. No significant effusion. Upper Abdomen: Calcified plaque over the abdominal aorta. Small focus of air posterior to the distal stomach on the most inferior images as this is incompletely evaluated. Musculoskeletal: Extensive subcutaneous emphysema throughout the thorax unchanged from recent chest radiograph. Mild amount of fluid in air superficial to the median sternotomy. Subcutaneous fluid throughout the thorax. Degenerative change of the spine. Schmorl's node over the superior endplate of a lower thoracic vertebral body. IMPRESSION: 1. Known small bilateral pneumothoraces with left-sided chest tube in place as described. Mild posterior dependent atelectasis and lingular atelectasis. Known pneumomediastinum and stable extensive subcutaneous emphysema and fluid over the thorax. 2. Recent CABG. Mild fluid and air immediately superficial to the sternotomy site likely postsurgical changes and less likely infection. 3.  Aortic Atherosclerosis (ICD10-I70.0). Electronically Signed   By: Marin Olp M.D.   On: 10/22/2018 13:40   Dg Chest [facility]  Result Date: 10/22/2018 CLINICAL DATA:  68 year old male with left-sided pneumothorax, chest tube in place EXAM: PORTABLE CHEST 1 VIEW COMPARISON:  Prior chest x-ray 10/21/2018 FINDINGS: Stable left-sided pneumothorax. Left basilar chest tube remains in good position. Tiny right-sided pneumothorax also remains unchanged. Extensive subcutaneous emphysema throughout all of the visualized soft tissues. Patient is status post median sternotomy with evidence of multivessel CABG. Cardiac and mediastinal contours remain unchanged. Atherosclerotic calcifications visualized in the thoracic aorta. The underlying lungs are difficult to assess given the severity of overlying subcutaneous emphysema. There may be  scattered areas of patchy airspace opacification. IMPRESSION: 1. Persistent extensive severe subcutaneous emphysema which significantly limits evaluation of the underlying lungs. 2. Stable small left pneumothorax with chest tube in place. 3. Stable trace right basilar pneumothorax. 4. Possible multifocal patchy airspace opacities versus artifact from overlying subcutaneous emphysema. Electronically Signed   By: Jacqulynn Cadet M.D.   On: 10/22/2018 07:56    Grace Isaac MD  Beeper [phone] Office [phone] 10/22/2018 3:37 PM

## 2018-10-22 NOTE — Progress Notes (Signed)
Nutrition Follow-up  RD working remotely.  DOCUMENTATION CODES:   Obesity unspecified  INTERVENTION:   - Increase Ensure Enlive po to TID, each supplement provides 350 kcal and 20 grams of protein  - Add Pro-stat 30 ml po BID, each supplement provides 100 kcal and 15 grams of protein  - Continue MVI with minerals daily  - Continue to encourage adequate PO intake  - d/c Boost Plus as pt is refusing  NUTRITION DIAGNOSIS:   Increased nutrient needs related to chronic illness (COPD) as evidenced by estimated needs.  Ongoing, being addressed via oral nutrition supplements  GOAL:   Patient will meet greater than or equal to 90% of their needs  Progressing  MONITOR:   PO intake, Supplement acceptance, Labs, I & O's, Weight trends, Skin  REASON FOR ASSESSMENT:   LOS    ASSESSMENT:   68 year old male who presented on 6/25 for redo CABG x 1. PMH of COPD, DM, GERD, HLD, HTN, CAD. Pt extubated after procedure.  6/29 - developed subcutaneous emphysema, s/p right chest tube placement 7/06 - chest tube exchange in OR  Noted plan for left chest tube placement in IR today due to concern that current chest tube not able to reach pocket of air that is causing subcutaneous air. Pt is NPO at this time for procedure.  Weight stable since last RD visit.  Reviewed RN edema assessment. Pt with mild pitting edema to BUE, non-pitting edema to BLE, and moderate pitting facial edema.  Per MAR, pt accepting most Ensure Enlive supplements and is refusing all Boost Plus supplements. RD will increase Ensure Enlive from BID to TID and d/c Boost Plus. Will also add Pro-stat.  Meal Completion: 25-50% x last 8 recorded meals (multiple meal completions missing)  Medications reviewed and include: Dulcolax, Colace, Ensure Enlive BID, Lasix, SSI, Boost Plus TID with meals, Metformin, MVI with minerals, Protonix, zinc sulfate  Labs reviewed. CBG's: 101-124 x 24 hours  UOP: 625 ml x 24 hours CT:  50 ml x 24 hours I/O's: -2.0 L since admit  Diet Order:   Diet Order            Diet NPO time specified  Diet effective midnight              EDUCATION NEEDS:   Education needs have been addressed  Skin:  Skin Assessment: Skin Integrity Issues: Incisions: closed incisions to left leg and left chest  Last BM:  10/19/18  Height:   Ht Readings from Last 1 Encounters:  10/16/18 5\' 9"  (1.753 m)    Weight:   Wt Readings from Last 1 Encounters:  10/22/18 97.1 kg    Ideal Body Weight:  72.7 kg  BMI:  Body mass index is 31.61 kg/m.  Estimated Nutritional Needs:   Kcal:  1900-2100  Protein:  95-110 grams  Fluid:  >/= 1.8 L    Gaynell Face, MS, RD, LDN Inpatient Clinical Dietitian Pager: [phone] Weekend/After Hours: [phone]

## 2018-10-23 ENCOUNTER — Inpatient Hospital Stay (HOSPITAL_COMMUNITY): Payer: Medicare Other

## 2018-10-23 LAB — GLUCOSE, CAPILLARY
Glucose-Capillary: 98 mg/dL (ref 70–99)
Glucose-Capillary: 123 mg/dL — ABNORMAL HIGH (ref 70–99)
Glucose-Capillary: 108 mg/dL — ABNORMAL HIGH (ref 70–99)
Glucose-Capillary: 103 mg/dL — ABNORMAL HIGH (ref 70–99)

## 2018-10-23 LAB — CBC
HCT: 36.1 % — ABNORMAL LOW (ref 39.0–52.0)
Hemoglobin: 11.4 g/dL — ABNORMAL LOW (ref 13.0–17.0)
MCH: 31.7 pg (ref 26.0–34.0)
MCHC: 31.6 g/dL (ref 30.0–36.0)
MCV: 100.3 fL — ABNORMAL HIGH (ref 80.0–100.0)
Platelets: 351 10*3/uL (ref 150–400)
RBC: 3.6 MIL/uL — ABNORMAL LOW (ref 4.22–5.81)
RDW: 13.7 % (ref 11.5–15.5)
WBC: 8.6 10*3/uL (ref 4.0–10.5)
nRBC: 0 % (ref 0.0–0.2)

## 2018-10-23 LAB — BASIC METABOLIC PANEL
Anion gap: 10 (ref 5–15)
BUN: 19 mg/dL (ref 8–23)
CO2: 31 mmol/L (ref 22–32)
Calcium: 9 mg/dL (ref 8.9–10.3)
Chloride: 94 mmol/L — ABNORMAL LOW (ref 98–111)
Creatinine, Ser: 1.1 mg/dL (ref 0.61–1.24)
GFR calc Af Amer: >60 mL/min (ref >60)
GFR calc non Af Amer: >60 mL/min (ref >60)
Glucose, Bld: 110 mg/dL — ABNORMAL HIGH (ref 70–99)
Potassium: 4.4 mmol/L (ref 3.5–5.1)
Sodium: 135 mmol/L (ref 135–145)

## 2018-10-23 NOTE — Progress Notes (Addendum)
TCTS DAILY ICU PROGRESS NOTE                   [address]            [place] [address]          [phone]   8 Days Post-Op Procedure(s) (LRB): INSERTION OF LEFT CHEST TUBE (Left)  Total Length of Stay:  LOS: 19 days   Subjective: Patient thinks his breathing is better. He is sitting in the chair and has no other complaints this am.   Objective: Vital signs in last 24 hours: Temp:  [97.8 F (36.6 C)-98.2 F (36.8 C)] 98 F (36.7 C) (07/14 0334) Pulse Rate:  [74-114] 84 (07/14 0600) Cardiac Rhythm: Normal sinus rhythm (07/13 2000) Resp:  [10-20] 13 (07/14 0600) BP: (110-145)/(62-97) 111/67 (07/14 0400) SpO2:  [94 %-100 %] 100 % (07/14 0600) FiO2 (%):  [5 %] 5 % (07/13 1315) Weight:  [96.9 kg] 96.9 kg (07/14 0500)  Filed Weights   10/21/18 0500 10/22/18 0500 10/23/18 0500  Weight: 96.3 kg 97.1 kg 96.9 kg   Weight change: -0.2 kg      Intake/Output from previous day: 07/13 0701 - 07/14 0700 In: 0  Out: 275 [Urine:275]  Intake/Output this shift: No intake/output data recorded.  Current Meds: Scheduled Meds:  aspirin EC  325 mg Oral Daily   Or   aspirin  324 mg Per Tube Daily   azithromycin  250 mg Oral Q M,W,F   bisacodyl  10 mg Oral Daily   Or   bisacodyl  10 mg Rectal Daily   budesonide-formoterol  2 puff Inhalation BID   Chlorhexidine Gluconate Cloth  6 each Topical Daily   chlorpheniramine-HYDROcodone  5 mL Oral Q12H   docusate sodium  200 mg Oral Daily   enoxaparin (LOVENOX) injection  40 mg Subcutaneous Q24H   feeding supplement (ENSURE ENLIVE)  237 mL Oral TID BM   feeding supplement (PRO-STAT SUGAR FREE 64)  30 mL Oral BID   fluticasone  1 spray Each Nare Daily   furosemide  40 mg Oral Daily   insulin aspart  0-15 Units Subcutaneous TID WC   loratadine  10 mg Oral Daily   losartan  25 mg Oral Daily   mouth rinse  15 mL Mouth Rinse BID   metFORMIN  500 mg Oral BID WC   metoprolol tartrate  25 mg Oral BID     Or   metoprolol tartrate  25 mg Per Tube BID   montelukast  10 mg Oral QPM   multivitamin with minerals  1 tablet Oral Daily   pantoprazole  40 mg Oral Daily   rosuvastatin  40 mg Oral Daily   sodium chloride flush  3 mL Intravenous Q12H   sodium chloride flush  5 mL Intracatheter Q8H   umeclidinium bromide  1 puff Inhalation Daily   valACYclovir  500 mg Oral QPM   zinc sulfate  220 mg Oral BID   Continuous Infusions:  sodium chloride Stopped (10/19/18 2331)   lactated ringers     PRN Meds:.sodium chloride, albuterol, ALPRAZolam, magic mouthwash w/lidocaine, metoprolol tartrate, ondansetron (ZOFRAN) IV, oxyCODONE, polyvinyl alcohol, sodium chloride flush, sorbitol, traMADol, white petrolatum  General appearance: alert, cooperative and no distress Neurologic: intact Heart: RRR Lungs: Coarse bilaterally, extensive subcutaneous emphysema bilateral chest, neck, face, eyes Abdomen: Soft, non tender, obese, bowel sounds Extremities: ++ LE edema Wounds: Clean and dry Chest tubes: to suction, no air leak  Lab  Results: CBC: Recent Labs    10/23/18 0308  WBC 8.6  HGB 11.4*  HCT 36.1*  PLT 351   BMET:  Recent Labs    10/23/18 0308  NA 135  K 4.4  CL 94*  CO2 31  GLUCOSE 110*  BUN 19  CREATININE 1.10  CALCIUM 9.0    CMET: Lab Results  Component Value Date   WBC 8.6 10/23/2018   HGB 11.4 (L) 10/23/2018   HCT 36.1 (L) 10/23/2018   PLT 351 10/23/2018   GLUCOSE 110 (H) 10/23/2018   ALT 22 10/08/2018   AST 21 10/08/2018   NA 135 10/23/2018   K 4.4 10/23/2018   CL 94 (L) 10/23/2018   CREATININE 1.10 10/23/2018   BUN 19 10/23/2018   CO2 31 10/23/2018   INR 1.1 10/22/2018   HGBA1C 6.2 (H) 10/01/2018      PT/INR:  Recent Labs    10/22/18 0940  LABPROT 14.4  INR 1.1   Radiology: Ct Chest Wo Contrast  Result Date: 10/22/2018 CLINICAL DATA:  Known pneumothorax for follow-up.  CABG 10/04/2018. EXAM: CT CHEST WITHOUT CONTRAST TECHNIQUE:  Multidetector CT imaging of the chest was performed following the standard protocol without IV contrast. COMPARISON:  CT 09/12/2018 and chest x-ray today. FINDINGS: Cardiovascular: Heart is normal size. Evidence of previous CABG. Thoracic aorta is normal in caliber. Mild calcified plaque involving the thoracic aorta. Mediastinum/Nodes: No pneumomediastinum. No mediastinal or hilar adenopathy. Lungs/Pleura: Known small bilateral pneumothoraces are present. Left-sided chest tube is present with tip over the posterior left lung base adjacent the diaphragm. Posterior bibasilar atelectasis present. There is atelectasis over the lingula adjacent the left-sided chest tube. No significant effusion. Upper Abdomen: Calcified plaque over the abdominal aorta. Small focus of air posterior to the distal stomach on the most inferior images as this is incompletely evaluated. Musculoskeletal: Extensive subcutaneous emphysema throughout the thorax unchanged from recent chest radiograph. Mild amount of fluid in air superficial to the median sternotomy. Subcutaneous fluid throughout the thorax. Degenerative change of the spine. Schmorl's node over the superior endplate of a lower thoracic vertebral body. IMPRESSION: 1. Known small bilateral pneumothoraces with left-sided chest tube in place as described. Mild posterior dependent atelectasis and lingular atelectasis. Known pneumomediastinum and stable extensive subcutaneous emphysema and fluid over the thorax. 2. Recent CABG. Mild fluid and air immediately superficial to the sternotomy site likely postsurgical changes and less likely infection. 3.  Aortic Atherosclerosis (ICD10-I70.0). Electronically Signed   By: Marin Olp M.D.   On: 10/22/2018 13:40   Dg Chest Port 1 View  Result Date: 10/23/2018 CLINICAL DATA:  Shortness of breath. EXAM: PORTABLE CHEST 1 VIEW COMPARISON:  Radiograph of October 22, 2018. FINDINGS: Stable cardiomediastinal silhouette. Stable extensive subcutaneous  emphysema is noted bilaterally. Stable position of previously placed left basilar chest tube. New pigtail catheter is seen in left lung base. Left basilar pneumothorax noted on prior exam is no longer visualized. Bony thorax is unremarkable. IMPRESSION: Interval placement of new pigtail catheter into left lung base. Left basilar pneumothorax noted on prior exam is not well visualized. No definite pneumothorax is seen currently, although this may be obscured due to extensive overlying subcutaneous emphysema. Electronically Signed   By: Marijo Conception M.D.   On: 10/23/2018 07:17   Ct Perc Pleural Drain W/indwell Cath W/img Guide  Result Date: 10/22/2018 CLINICAL DATA:  Persistent progressive subcutaneous emphysema after median sternotomy despite no evidence of air leak from surgical left chest tube. EXAM: CT GUIDED LEFT  CHEST TUBE PLACEMENT ANESTHESIA/SEDATION: Intravenous Fentanyl 180mcg and Versed 1.5mg  were administered as conscious sedation during continuous monitoring of the patient's level of consciousness and physiological / cardiorespiratory status by the radiology RN, with a total moderate sedation time of of 18 minutes. PROCEDURE: The procedure, risks, benefits, and alternatives were explained to the patient. Questions regarding the procedure were encouraged and answered. The patient understands and consents to the procedure. Select axial scans through the thorax were obtained. An appropriate skin entry site to access the residual left lateral pneumothorax was identified and marked. The operative field was prepped with chlorhexidinein a sterile fashion, and a sterile drape was applied covering the operative field. A sterile gown and sterile gloves were used for the procedure. Local anesthesia was provided with 1% Lidocaine. Under CT fluoroscopic guidance, a 18 gauge x 10 cm trocar needle was advanced into the residual left lateral pneumothorax at the lung base. An Amplatz guidewire advanced easily, its  position confirmed on CT. Tract dilated to facilitate placement of a 14 French pigtail drain catheter, placed laterally in the left hemithorax. Catheter position was confirmed on CT. Catheter secured externally with 0 Prolene suture and StatLock and placed to Pleur-evac drainage. The patient tolerated the procedure well. COMPLICATIONS: None immediate FINDINGS: Small anterior and lateral left residual pneumothorax was identified. 14 French pigtail drain catheter placed as detailed above. IMPRESSION: 1. Technically successful CT-guided left chest tube placement. Electronically Signed   By: Lucrezia Europe M.D.   On: 10/22/2018 16:07     Assessment/Plan: S/P Procedure(s) (LRB): INSERTION OF LEFT CHEST TUBE (Left)   1. CV-SR in the 80's this am. On Lopressor 25 mg bid and Losartan 25 mg daily. 2. Pulmonary-s/p left 14 French pigtail chest tube by IR 07/13. No recorded chest tube output? Pleura VACs marked this am. CXR this am shows ? resolution of left base pneumothorax, extensive subcutaneous emphysema bilaterally., and ? Resolution of left apical pneumothorax. Encourage incentive spirometer. History of COPD-continue Symbicort, Singulair, and Incruse Ellipta. 3. Volume overload-on Lasix 40 mg daily 4. Expected ABL anemia-H and H increased to 11.4 and 36.1 5. DM-CBGs 89/122/112. On Metformin 500 mg bid. Pre op HGA1C 6.2   Donielle Liston Alba PA-C 10/23/2018 7:45 AM   Appreciate new L pigtail catheter by IR- subQ air slightly better Patient may go off suction to pleurovacs for daily ambulation  patient examined and medical record reviewed,agree with above note. [name] 10/23/2018

## 2018-10-23 NOTE — Progress Notes (Signed)
Referring Physician(s): Dr Darcey Nora  Supervising Physician: Arne Cleveland  Patient Status:  [facility] - In-pt  Chief Complaint:  Left chest tube placement in IR yesterday  Subjective:  Feeling better this am Chest tubes intact No air leaks OP minimal from IR chest tube-- (more central placement)  CXR today:  IMPRESSION: Interval placement of new pigtail catheter into left lung base. Left basilar pneumothorax noted on prior exam is not well visualized. No definite pneumothorax is seen currently, although this may be obscured due to extensive overlying subcutaneous emphysema.  Allergies: Atorvastatin and Sulfa antibiotics  Medications: Prior to Admission medications   Medication Sig Start Date End Date Taking? Authorizing Provider  albuterol (PROVENTIL HFA;VENTOLIN HFA) 108 (90 BASE) MCG/ACT inhaler Inhale 2 puffs into the lungs every 6 (six) hours as needed for wheezing or shortness of breath.   Yes [provider]  albuterol (PROVENTIL) (2.5 MG/3ML) 0.083% nebulizer solution Inhale 2.5 mg into the lungs every 6 (six) hours as needed for wheezing or shortness of breath. 04/27/18  Yes [provider]  aspirin EC 81 MG tablet Take 81 mg by mouth every evening.    Yes [provider]  azelastine (ASTELIN) 0.1 % nasal spray Place 1 spray into both nostrils 2 (two) times daily. Use in each nostril as directed   Yes [provider]  azithromycin (ZITHROMAX) 250 MG tablet Take 250 mg by mouth every Monday, Wednesday, and Friday.   Yes [provider]  Cholecalciferol (VITAMIN D3) 50 MCG (2000 UT) TABS Take 2,000 Units by mouth every morning.    Yes [provider]  fexofenadine (ALLEGRA) 180 MG tablet Take 180 mg by mouth daily. 05/27/18  Yes [provider]  fluticasone (FLONASE) 50 MCG/ACT nasal spray Place 1 spray into both nostrils daily.   Yes [provider]  losartan (COZAAR) 25 MG tablet Take 25 mg by mouth  daily. 07/08/18  Yes [provider]  metFORMIN (GLUCOPHAGE) 500 MG tablet Take 500 mg by mouth 2 (two) times a day.  05/04/17  Yes [provider]  metoprolol (LOPRESSOR) 50 MG tablet Take 50 mg by mouth 2 (two) times daily.   Yes [provider]  montelukast (SINGULAIR) 10 MG tablet Take 10 mg by mouth every evening. 07/02/18  Yes [provider]  rosuvastatin (CRESTOR) 40 MG tablet Take 40 mg by mouth daily.   Yes [provider]  SYMBICORT 160-4.5 MCG/ACT inhaler Inhale 2 puffs into the lungs 2 (two) times a day. 05/07/18  Yes [provider]  Tiotropium Bromide Monohydrate (SPIRIVA RESPIMAT) 2.5 MCG/ACT AERS Inhale 2 puffs into the lungs daily.  03/06/17  Yes [provider]  valACYclovir (VALTREX) 500 MG tablet Take 500 mg by mouth every evening. 06/13/18  Yes [provider]  DENTA 5000 PLUS 1.1 % CREA dental cream Take 1 application by mouth at bedtime. Brush teeth for at least 1 min 05/09/17   [provider]  sildenafil (REVATIO) 20 MG tablet Take 20-100 mg by mouth daily as needed for erectile dysfunction. 12/07/17   [provider]     Vital Signs: BP 111/67    Pulse 84    Temp 98.2 F (36.8 C) (Oral)    Resp 13    Ht 5\' 9"  (1.753 m)    Wt 213 lb 10 oz (96.9 kg)    SpO2 100%    BMI 31.55 kg/m   Physical Exam Vitals signs reviewed.  Constitutional:  Comments: Up in chair; pleasant  Pulmonary:     Effort: Pulmonary effort is normal.     Breath sounds: Wheezing present.  Musculoskeletal: Normal range of motion.  Skin:    General: Skin is warm and dry.     Comments: Sub cu air quite apparent face/neck/chest  Neurological:     Mental Status: He is alert and oriented to person, place, and time.  Psychiatric:        Mood and Affect: Mood normal.        Behavior: Behavior normal.        Thought Content: Thought content normal.        Judgment: Judgment normal.     Imaging: Ct Chest Wo  Contrast  Result Date: 10/22/2018 CLINICAL DATA:  Known pneumothorax for follow-up.  CABG 10/04/2018. EXAM: CT CHEST WITHOUT CONTRAST TECHNIQUE: Multidetector CT imaging of the chest was performed following the standard protocol without IV contrast. COMPARISON:  CT 09/12/2018 and chest x-ray today. FINDINGS: Cardiovascular: Heart is normal size. Evidence of previous CABG. Thoracic aorta is normal in caliber. Mild calcified plaque involving the thoracic aorta. Mediastinum/Nodes: No pneumomediastinum. No mediastinal or hilar adenopathy. Lungs/Pleura: Known small bilateral pneumothoraces are present. Left-sided chest tube is present with tip over the posterior left lung base adjacent the diaphragm. Posterior bibasilar atelectasis present. There is atelectasis over the lingula adjacent the left-sided chest tube. No significant effusion. Upper Abdomen: Calcified plaque over the abdominal aorta. Small focus of air posterior to the distal stomach on the most inferior images as this is incompletely evaluated. Musculoskeletal: Extensive subcutaneous emphysema throughout the thorax unchanged from recent chest radiograph. Mild amount of fluid in air superficial to the median sternotomy. Subcutaneous fluid throughout the thorax. Degenerative change of the spine. Schmorl's node over the superior endplate of a lower thoracic vertebral body. IMPRESSION: 1. Known small bilateral pneumothoraces with left-sided chest tube in place as described. Mild posterior dependent atelectasis and lingular atelectasis. Known pneumomediastinum and stable extensive subcutaneous emphysema and fluid over the thorax. 2. Recent CABG. Mild fluid and air immediately superficial to the sternotomy site likely postsurgical changes and less likely infection. 3.  Aortic Atherosclerosis (ICD10-I70.0). Electronically Signed   By: Marin Olp M.D.   On: 10/22/2018 13:40   Dg Chest Port 1 View  Result Date: 10/23/2018 CLINICAL DATA:  Shortness of breath.  EXAM: PORTABLE CHEST 1 VIEW COMPARISON:  Radiograph of October 22, 2018. FINDINGS: Stable cardiomediastinal silhouette. Stable extensive subcutaneous emphysema is noted bilaterally. Stable position of previously placed left basilar chest tube. New pigtail catheter is seen in left lung base. Left basilar pneumothorax noted on prior exam is no longer visualized. Bony thorax is unremarkable. IMPRESSION: Interval placement of new pigtail catheter into left lung base. Left basilar pneumothorax noted on prior exam is not well visualized. No definite pneumothorax is seen currently, although this may be obscured due to extensive overlying subcutaneous emphysema. Electronically Signed   By: Marijo Conception M.D.   On: 10/23/2018 07:17   Dg Chest Port 1 View  Result Date: 10/22/2018 CLINICAL DATA:  68 year old male with left-sided pneumothorax, chest tube in place EXAM: PORTABLE CHEST 1 VIEW COMPARISON:  Prior chest x-ray 10/21/2018 FINDINGS: Stable left-sided pneumothorax. Left basilar chest tube remains in good position. Tiny right-sided pneumothorax also remains unchanged. Extensive subcutaneous emphysema throughout all of the visualized soft tissues. Patient is status post median sternotomy with evidence of multivessel CABG. Cardiac and mediastinal contours remain unchanged. Atherosclerotic calcifications visualized in the thoracic aorta. The  underlying lungs are difficult to assess given the severity of overlying subcutaneous emphysema. There may be scattered areas of patchy airspace opacification. IMPRESSION: 1. Persistent extensive severe subcutaneous emphysema which significantly limits evaluation of the underlying lungs. 2. Stable small left pneumothorax with chest tube in place. 3. Stable trace right basilar pneumothorax. 4. Possible multifocal patchy airspace opacities versus artifact from overlying subcutaneous emphysema. Electronically Signed   By: Jacqulynn Cadet M.D.   On: 10/22/2018 07:56   Dg Chest Port 1  View  Result Date: 10/21/2018 CLINICAL DATA:  Follow-up pneumothorax and chest tube. EXAM: PORTABLE CHEST 1 VIEW COMPARISON:  10/20/2018 and multiple earlier exams. FINDINGS: Right-sided pneumothorax has mildly increased in size from the previous day's study. Top of the partly collapsed right lung is now separated from the a apical parietal pleura by 3.7 cm, previously 3.1 cm. Small left pneumothorax is stable from the previous day's exam. No change in the position of the left inferior hemithorax chest tube. There are opacities at the lung bases consistent with atelectasis. Extensive subcutaneous emphysema is noted throughout the chest extending to the neck base, similar to the previous day's exam. IMPRESSION: 1. Mild interval increase in the size of the right pneumothorax from the previous day's exam. 2. Stable, small, left pneumothorax. No change in the position of the left chest tube. 3. Stable extensive subcutaneous emphysema. Electronically Signed   By: Lajean Manes M.D.   On: 10/21/2018 05:46   Dg Chest Port 1 View  Result Date: 10/20/2018 CLINICAL DATA:  Follow-up pneumothorax. EXAM: PORTABLE CHEST 1 VIEW COMPARISON:  10/19/2018 and older exams. FINDINGS: Small left pneumothorax is stable from the most recent prior exam. There is also a small right pneumothorax, estimated at 10-15%. Left chest tube along the inferior left hemithorax is unchanged. There is associated left lung base opacity consistent with atelectasis. Extensive bilateral subcutaneous emphysema is stable from the most recent prior exam. IMPRESSION: 1. Small right pneumothorax, estimated 10-15%, is now evident. 2. No change in the size of the small left pneumothorax or in the position of the left-sided chest tube. 3. No change in the extensive subcutaneous emphysema. Electronically Signed   By: Lajean Manes M.D.   On: 10/20/2018 06:20   Ct Perc Pleural Drain W/indwell Cath W/img Guide  Result Date: 10/22/2018 CLINICAL DATA:   Persistent progressive subcutaneous emphysema after median sternotomy despite no evidence of air leak from surgical left chest tube. EXAM: CT GUIDED LEFT CHEST TUBE PLACEMENT ANESTHESIA/SEDATION: Intravenous Fentanyl 130mcg and Versed 1.5mg  were administered as conscious sedation during continuous monitoring of the patient's level of consciousness and physiological / cardiorespiratory status by the radiology RN, with a total moderate sedation time of of 18 minutes. PROCEDURE: The procedure, risks, benefits, and alternatives were explained to the patient. Questions regarding the procedure were encouraged and answered. The patient understands and consents to the procedure. Select axial scans through the thorax were obtained. An appropriate skin entry site to access the residual left lateral pneumothorax was identified and marked. The operative field was prepped with chlorhexidinein a sterile fashion, and a sterile drape was applied covering the operative field. A sterile gown and sterile gloves were used for the procedure. Local anesthesia was provided with 1% Lidocaine. Under CT fluoroscopic guidance, a 18 gauge x 10 cm trocar needle was advanced into the residual left lateral pneumothorax at the lung base. An Amplatz guidewire advanced easily, its position confirmed on CT. Tract dilated to facilitate placement of a 14 French pigtail drain catheter,  placed laterally in the left hemithorax. Catheter position was confirmed on CT. Catheter secured externally with 0 Prolene suture and StatLock and placed to Pleur-evac drainage. The patient tolerated the procedure well. COMPLICATIONS: None immediate FINDINGS: Small anterior and lateral left residual pneumothorax was identified. 14 French pigtail drain catheter placed as detailed above. IMPRESSION: 1. Technically successful CT-guided left chest tube placement. Electronically Signed   By: Lucrezia Europe M.D.   On: 10/22/2018 16:07    Labs:  CBC: Recent Labs     10/17/18 0238 10/18/18 0226 10/20/18 0246 10/23/18 0308  WBC 12.5* 10.3 8.5 8.6  HGB 10.6* 10.9* 10.9* 11.4*  HCT 33.2* 33.6* 33.9* 36.1*  PLT 272 286 323 351    COAGS: Recent Labs    10/01/18 1003 10/04/18 1326 10/22/18 0940  INR 1.1 1.6* 1.1  APTT 24 32  --     BMP: Recent Labs    10/17/18 0238 10/18/18 0226 10/20/18 0246 10/23/18 0308  NA 137 133* 135 135  K 4.2 3.8 3.7 4.4  CL 97* 95* 95* 94*  CO2 28 28 31 31   GLUCOSE 96 95 96 110*  BUN 27* 21 15 19   CALCIUM 8.7* 8.5* 8.6* 9.0  CREATININE 1.00 1.07 1.05 1.10  GFRNONAA >60 >60 >60 >60  GFRAA >60 >60 >60 >60    LIVER FUNCTION TESTS: Recent Labs    10/01/18 1003 10/08/18 0303  BILITOT 0.6 0.6  AST 24 21  ALT 50* 22  ALKPHOS 43 33*  PROT 5.9* 5.3*  ALBUMIN 3.7 2.7*    Assessment and Plan:  Additional left chest tube placed in IR 7/13 Pt feeling some better today subcu air apparent face/neck/chest Will follow Plan per TCTS  Electronically Signed: Lavonia Drafts, PA-C 10/23/2018, 8:18 AM   I spent a total of 15 Minutes at the the patient's bedside AND on the patient's hospital floor or unit, greater than 50% of which was counseling/coordinating care for left pigtail chest tube placement

## 2018-10-23 NOTE — Progress Notes (Signed)
TCTS BRIEF SICU PROGRESS NOTE  8 Days Post-Op  S/P Procedure(s) (LRB): INSERTION OF LEFT CHEST TUBE (Left)   Stable day  Plan: Continue current plan  Rexene Alberts, MD 10/23/2018 6:23 PM

## 2018-10-24 ENCOUNTER — Inpatient Hospital Stay (HOSPITAL_COMMUNITY): Payer: Medicare Other

## 2018-10-24 LAB — BASIC METABOLIC PANEL
Anion gap: 9 (ref 5–15)
BUN: 16 mg/dL (ref 8–23)
CO2: 33 mmol/L — ABNORMAL HIGH (ref 22–32)
Calcium: 8.9 mg/dL (ref 8.9–10.3)
Chloride: 94 mmol/L — ABNORMAL LOW (ref 98–111)
Creatinine, Ser: 0.95 mg/dL (ref 0.61–1.24)
GFR calc Af Amer: >60 mL/min (ref >60)
GFR calc non Af Amer: >60 mL/min (ref >60)
Glucose, Bld: 99 mg/dL (ref 70–99)
Potassium: 4 mmol/L (ref 3.5–5.1)
Sodium: 136 mmol/L (ref 135–145)

## 2018-10-24 LAB — GLUCOSE, CAPILLARY
Glucose-Capillary: 86 mg/dL (ref 70–99)
Glucose-Capillary: 126 mg/dL — ABNORMAL HIGH (ref 70–99)
Glucose-Capillary: 131 mg/dL — ABNORMAL HIGH (ref 70–99)
Glucose-Capillary: 150 mg/dL — ABNORMAL HIGH (ref 70–99)

## 2018-10-24 MED ORDER — DIPHENHYDRAMINE HCL 50 MG/ML IJ SOLN
25.0000 mg | Freq: Once | INTRAMUSCULAR | Status: AC
  Administered 2018-10-24: 25 mg via INTRAVENOUS
  Filled 2018-10-24: qty 1

## 2018-10-24 MED ORDER — METOLAZONE 5 MG PO TABS
5.0000 mg | ORAL_TABLET | Freq: Every day | ORAL | Status: AC
  Administered 2018-10-24: 5 mg via ORAL
  Filled 2018-10-24: qty 1

## 2018-10-24 MED ORDER — POTASSIUM CHLORIDE CRYS ER 20 MEQ PO TBCR: 20.0000 meq | EXTENDED_RELEASE_TABLET | Freq: Every day | ORAL | Status: DC

## 2018-10-24 MED ORDER — POTASSIUM CHLORIDE CRYS ER 20 MEQ PO TBCR
30.0000 meq | EXTENDED_RELEASE_TABLET | Freq: Once | ORAL | Status: AC
  Administered 2018-10-24: 30 meq via ORAL
  Filled 2018-10-24: qty 1

## 2018-10-24 MED ORDER — METHYLPREDNISOLONE SODIUM SUCC 40 MG IJ SOLR
40.0000 mg | Freq: Once | INTRAMUSCULAR | Status: AC
  Administered 2018-10-24: 10:00:00 40 mg via INTRAVENOUS
  Filled 2018-10-24: qty 1

## 2018-10-24 NOTE — Progress Notes (Signed)
Referring Physician(s): Dr Darcey Nora  Supervising Physician: Jacqulynn Cadet  Patient Status:  Melvin Brewer - In-pt  Chief Complaint:  Left pigtail chest tube placed in IR 7/13  Subjective:  Feeling better daily Less swelling noted around eyes Chest tubes intact No air leaks OP minimal from IR chest tube-- (more central placement)  CXR today:  IMPRESSION: Two chest tubes in the left base. No definitive recurrent pneumothorax is seen although the lung markings are somewhat obscured by significant subcutaneous emphysema  Allergies: Atorvastatin and Sulfa antibiotics  Medications: Prior to Admission medications   Medication Sig Start Date End Date Taking? Authorizing Provider  albuterol (PROVENTIL HFA;VENTOLIN HFA) 108 (90 BASE) MCG/ACT inhaler Inhale 2 puffs into the lungs every 6 (six) hours as needed for wheezing or shortness of breath.   Yes [provider]  albuterol (PROVENTIL) (2.5 MG/3ML) 0.083% nebulizer solution Inhale 2.5 mg into the lungs every 6 (six) hours as needed for wheezing or shortness of breath. 04/27/18  Yes [provider]  aspirin EC 81 MG tablet Take 81 mg by mouth every evening.    Yes [provider]  azelastine (ASTELIN) 0.1 % nasal spray Place 1 spray into both nostrils 2 (two) times daily. Use in each nostril as directed   Yes [provider]  azithromycin (ZITHROMAX) 250 MG tablet Take 250 mg by mouth every Monday, Wednesday, and Friday.   Yes [provider]  Cholecalciferol (VITAMIN D3) 50 MCG (2000 UT) TABS Take 2,000 Units by mouth every morning.    Yes [provider]  fexofenadine (ALLEGRA) 180 MG tablet Take 180 mg by mouth daily. 05/27/18  Yes [provider]  fluticasone (FLONASE) 50 MCG/ACT nasal spray Place 1 spray into both nostrils daily.   Yes [provider]  losartan (COZAAR) 25 MG tablet Take 25 mg by mouth daily. 07/08/18  Yes [provider]  metFORMIN  (GLUCOPHAGE) 500 MG tablet Take 500 mg by mouth 2 (two) times a day.  05/04/17  Yes [provider]  metoprolol (LOPRESSOR) 50 MG tablet Take 50 mg by mouth 2 (two) times daily.   Yes [provider]  montelukast (SINGULAIR) 10 MG tablet Take 10 mg by mouth every evening. 07/02/18  Yes [provider]  rosuvastatin (CRESTOR) 40 MG tablet Take 40 mg by mouth daily.   Yes [provider]  SYMBICORT 160-4.5 MCG/ACT inhaler Inhale 2 puffs into the lungs 2 (two) times a day. 05/07/18  Yes [provider]  Tiotropium Bromide Monohydrate (SPIRIVA RESPIMAT) 2.5 MCG/ACT AERS Inhale 2 puffs into the lungs daily.  03/06/17  Yes [provider]  valACYclovir (VALTREX) 500 MG tablet Take 500 mg by mouth every evening. 06/13/18  Yes [provider]  DENTA 5000 PLUS 1.1 % CREA dental cream Take 1 application by mouth at bedtime. Brush teeth for at least 1 min 05/09/17   [provider]  sildenafil (REVATIO) 20 MG tablet Take 20-100 mg by mouth daily as needed for erectile dysfunction. 12/07/17   [provider]     Vital Signs: BP (!) 97/55    Pulse 92    Temp 97.8 F (36.6 C) (Oral)    Resp 14    Ht 5\' 9"  (1.753 m)    Wt 211 lb 6.7 oz (95.9 kg)    SpO2 100%    BMI 31.22 kg/m   Physical Exam Vitals signs reviewed.  HENT:     Head:     Comments: Sub cu  air face; neck; chest-- little in arms Skin:    General: Skin is warm and dry.     Comments: Sites of chest tube are clean and dry NT no bleeding (IR Chest tube more centrally located- left chest)  No air leaks OP of IR chest tube is clear yellow OP of surgical Chest tube-- more bloody color   Neurological:     Mental Status: He is alert and oriented to person, place, and time.     Imaging: Ct Chest Wo Contrast  Result Date: 10/22/2018 CLINICAL DATA:  Known pneumothorax for follow-up.  CABG 10/04/2018. EXAM: CT CHEST WITHOUT CONTRAST TECHNIQUE: Multidetector CT imaging of  the chest was performed following the standard protocol without IV contrast. COMPARISON:  CT 09/12/2018 and chest x-ray today. FINDINGS: Cardiovascular: Heart is normal size. Evidence of previous CABG. Thoracic aorta is normal in caliber. Mild calcified plaque involving the thoracic aorta. Mediastinum/Nodes: No pneumomediastinum. No mediastinal or hilar adenopathy. Lungs/Pleura: Known small bilateral pneumothoraces are present. Left-sided chest tube is present with tip over the posterior left lung base adjacent the diaphragm. Posterior bibasilar atelectasis present. There is atelectasis over the lingula adjacent the left-sided chest tube. No significant effusion. Upper Abdomen: Calcified plaque over the abdominal aorta. Small focus of air posterior to the distal stomach on the most inferior images as this is incompletely evaluated. Musculoskeletal: Extensive subcutaneous emphysema throughout the thorax unchanged from recent chest radiograph. Mild amount of fluid in air superficial to the median sternotomy. Subcutaneous fluid throughout the thorax. Degenerative change of the spine. Schmorl's node over the superior endplate of a lower thoracic vertebral body. IMPRESSION: 1. Known small bilateral pneumothoraces with left-sided chest tube in place as described. Mild posterior dependent atelectasis and lingular atelectasis. Known pneumomediastinum and stable extensive subcutaneous emphysema and fluid over the thorax. 2. Recent CABG. Mild fluid and air immediately superficial to the sternotomy site likely postsurgical changes and less likely infection. 3.  Aortic Atherosclerosis (ICD10-I70.0). Electronically Signed   By: Marin Olp M.D.   On: 10/22/2018 13:40   Dg Chest Port 1 View  Result Date: 10/24/2018 CLINICAL DATA:  Follow-up pneumothorax EXAM: PORTABLE CHEST 1 VIEW COMPARISON:  10/23/2018 FINDINGS: Two basilar left-sided chest tubes are again identified and stable. No definitive pneumothorax is identified.  Considerable subcutaneous edema is noted throughout the chest. No focal infiltrate is seen. Mild left basilar atelectasis is again noted. Postsurgical changes are seen. IMPRESSION: Two chest tubes in the left base. No definitive recurrent pneumothorax is seen although the lung markings are somewhat obscured by significant subcutaneous emphysema. Electronically Signed   By: Inez Catalina M.D.   On: 10/24/2018 07:21   Dg Chest Port 1 View  Result Date: 10/23/2018 CLINICAL DATA:  Shortness of breath. EXAM: PORTABLE CHEST 1 VIEW COMPARISON:  Radiograph of October 22, 2018. FINDINGS: Stable cardiomediastinal silhouette. Stable extensive subcutaneous emphysema is noted bilaterally. Stable position of previously placed left basilar chest tube. New pigtail catheter is seen in left lung base. Left basilar pneumothorax noted on prior exam is no longer visualized. Bony thorax is unremarkable. IMPRESSION: Interval placement of new pigtail catheter into left lung base. Left basilar pneumothorax noted on prior exam is not well visualized. No definite pneumothorax is seen currently, although this may be obscured due to extensive overlying subcutaneous emphysema. Electronically Signed   By: Marijo Conception M.D.   On: 10/23/2018 07:17   Dg Chest Port 1 View  Result Date: 10/22/2018 CLINICAL DATA:  68 year old male with left-sided pneumothorax, chest  tube in place EXAM: PORTABLE CHEST 1 VIEW COMPARISON:  Prior chest x-ray 10/21/2018 FINDINGS: Stable left-sided pneumothorax. Left basilar chest tube remains in good position. Tiny right-sided pneumothorax also remains unchanged. Extensive subcutaneous emphysema throughout all of the visualized soft tissues. Patient is status post median sternotomy with evidence of multivessel CABG. Cardiac and mediastinal contours remain unchanged. Atherosclerotic calcifications visualized in the thoracic aorta. The underlying lungs are difficult to assess given the severity of overlying  subcutaneous emphysema. There may be scattered areas of patchy airspace opacification. IMPRESSION: 1. Persistent extensive severe subcutaneous emphysema which significantly limits evaluation of the underlying lungs. 2. Stable small left pneumothorax with chest tube in place. 3. Stable trace right basilar pneumothorax. 4. Possible multifocal patchy airspace opacities versus artifact from overlying subcutaneous emphysema. Electronically Signed   By: Jacqulynn Cadet M.D.   On: 10/22/2018 07:56   Dg Chest Port 1 View  Result Date: 10/21/2018 CLINICAL DATA:  Follow-up pneumothorax and chest tube. EXAM: PORTABLE CHEST 1 VIEW COMPARISON:  10/20/2018 and multiple earlier exams. FINDINGS: Right-sided pneumothorax has mildly increased in size from the previous day's study. Top of the partly collapsed right lung is now separated from the a apical parietal pleura by 3.7 cm, previously 3.1 cm. Small left pneumothorax is stable from the previous day's exam. No change in the position of the left inferior hemithorax chest tube. There are opacities at the lung bases consistent with atelectasis. Extensive subcutaneous emphysema is noted throughout the chest extending to the neck base, similar to the previous day's exam. IMPRESSION: 1. Mild interval increase in the size of the right pneumothorax from the previous day's exam. 2. Stable, small, left pneumothorax. No change in the position of the left chest tube. 3. Stable extensive subcutaneous emphysema. Electronically Signed   By: Lajean Manes M.D.   On: 10/21/2018 05:46   Ct Perc Pleural Drain W/indwell Cath W/img Guide  Result Date: 10/22/2018 CLINICAL DATA:  Persistent progressive subcutaneous emphysema after median sternotomy despite no evidence of air leak from surgical left chest tube. EXAM: CT GUIDED LEFT CHEST TUBE PLACEMENT ANESTHESIA/SEDATION: Intravenous Fentanyl 167mcg and Versed 1.5mg  were administered as conscious sedation during continuous monitoring of the  patient's level of consciousness and physiological / cardiorespiratory status by the radiology RN, with a total moderate sedation time of of 18 minutes. PROCEDURE: The procedure, risks, benefits, and alternatives were explained to the patient. Questions regarding the procedure were encouraged and answered. The patient understands and consents to the procedure. Select axial scans through the thorax were obtained. An appropriate skin entry site to access the residual left lateral pneumothorax was identified and marked. The operative field was prepped with chlorhexidinein a sterile fashion, and a sterile drape was applied covering the operative field. A sterile gown and sterile gloves were used for the procedure. Local anesthesia was provided with 1% Lidocaine. Under CT fluoroscopic guidance, a 18 gauge x 10 cm trocar needle was advanced into the residual left lateral pneumothorax at the lung base. An Amplatz guidewire advanced easily, its position confirmed on CT. Tract dilated to facilitate placement of a 14 French pigtail drain catheter, placed laterally in the left hemithorax. Catheter position was confirmed on CT. Catheter secured externally with 0 Prolene suture and StatLock and placed to Pleur-evac drainage. The patient tolerated the procedure well. COMPLICATIONS: None immediate FINDINGS: Small anterior and lateral left residual pneumothorax was identified. 14 French pigtail drain catheter placed as detailed above. IMPRESSION: 1. Technically successful CT-guided left chest tube placement. Electronically Signed  By: Lucrezia Europe M.D.   On: 10/22/2018 16:07    Labs:  CBC: Recent Labs    10/17/18 0238 10/18/18 0226 10/20/18 0246 10/23/18 0308  WBC 12.5* 10.3 8.5 8.6  HGB 10.6* 10.9* 10.9* 11.4*  HCT 33.2* 33.6* 33.9* 36.1*  PLT 272 286 323 351    COAGS: Recent Labs    10/01/18 1003 10/04/18 1326 10/22/18 0940  INR 1.1 1.6* 1.1  APTT 24 32  --     BMP: Recent Labs    10/18/18 0226  10/20/18 0246 10/23/18 0308 10/24/18 0320  NA 133* 135 135 136  K 3.8 3.7 4.4 4.0  CL 95* 95* 94* 94*  CO2 28 31 31  33*  GLUCOSE 95 96 110* 99  BUN 21 15 19 16   CALCIUM 8.5* 8.6* 9.0 8.9  CREATININE 1.07 1.05 1.10 0.95  GFRNONAA >60 >60 >60 >60  GFRAA >60 >60 >60 >60    LIVER FUNCTION TESTS: Recent Labs    10/01/18 1003 10/08/18 0303  BILITOT 0.6 0.6  AST 24 21  ALT 50* 22  ALKPHOS 43 33*  PROT 5.9* 5.3*  ALBUMIN 3.7 2.7*    Assessment and Plan:  CABG redo 7/6 PTX and sub cu air apparent Additional Left pigtail chest tube placed in IR 7/14 Will follow Plan per [facility]  Electronically Signed: Lavonia Drafts, PA-C 10/24/2018, 8:08 AM   I spent a total of 15 Minutes at the the patient's bedside AND on the patient's hospital floor or unit, greater than 50% of which was counseling/coordinating care for left pigtail Chest tube    Patient ID: Melvin Brewer, male   DOB: 1951-02-26, 68 y.o.   MRN: [id number]

## 2018-10-24 NOTE — Progress Notes (Signed)
Patient ID: Melvin Brewer, male   DOB: 05/30/1950, 68 y.o.   MRN: [id number] [facility] Evening Rounds:  Hemodynamically stable Facial swelling improved today. No visible air leak from tubes.

## 2018-10-24 NOTE — Progress Notes (Addendum)
[facility] DAILY ICU PROGRESS NOTE                   [address]            [place] [address]          [phone]   9 Days Post-Op Procedure(s) (LRB): INSERTION OF LEFT CHEST TUBE (Left)  Total Length of Stay:  LOS: 20 days   Subjective: Patient states he had a good night;best one in a long time. No specific complaints this am.  Objective: Vital signs in last 24 hours: Temp:  [97.6 F (36.4 C)-98.3 F (36.8 C)] 97.6 F (36.4 C) (07/15 0636) Pulse Rate:  [74-113] 92 (07/15 0600) Cardiac Rhythm: Normal sinus rhythm (07/14 2000) Resp:  [9-18] 14 (07/15 0600) BP: (94-130)/(55-76) 97/55 (07/14 1855) SpO2:  [90 %-100 %] 100 % (07/15 0600) Weight:  [95.9 kg] 95.9 kg (07/15 0543)  Filed Weights   10/22/18 0500 10/23/18 0500 10/24/18 0543  Weight: 97.1 kg 96.9 kg 95.9 kg   Weight change: -1 kg      Intake/Output from previous day: 07/14 0701 - 07/15 0700 In: 365 [P.O.:360; I.V.:5] Out: 1170 [Urine:1050; Chest Tube:120]  Intake/Output this shift: No intake/output data recorded.  Current Meds: Scheduled Meds: . aspirin EC  325 mg Oral Daily   Or  . aspirin  324 mg Per Tube Daily  . azithromycin  250 mg Oral Q M,W,F  . bisacodyl  10 mg Oral Daily   Or  . bisacodyl  10 mg Rectal Daily  . budesonide-formoterol  2 puff Inhalation BID  . Chlorhexidine Gluconate Cloth  6 each Topical Daily  . chlorpheniramine-HYDROcodone  5 mL Oral Q12H  . docusate sodium  200 mg Oral Daily  . enoxaparin (LOVENOX) injection  40 mg Subcutaneous Q24H  . feeding supplement (ENSURE ENLIVE)  237 mL Oral TID BM  . fluticasone  1 spray Each Nare Daily  . furosemide  40 mg Oral Daily  . insulin aspart  0-15 Units Subcutaneous TID WC  . loratadine  10 mg Oral Daily  . losartan  25 mg Oral Daily  . mouth rinse  15 mL Mouth Rinse BID  . metFORMIN  500 mg Oral BID WC  . metoprolol tartrate  25 mg Oral BID   Or  . metoprolol tartrate  25 mg Per Tube BID  . montelukast  10 mg Oral  QPM  . multivitamin with minerals  1 tablet Oral Daily  . pantoprazole  40 mg Oral Daily  . rosuvastatin  40 mg Oral Daily  . sodium chloride flush  3 mL Intravenous Q12H  . sodium chloride flush  5 mL Intracatheter Q8H  . umeclidinium bromide  1 puff Inhalation Daily  . valACYclovir  500 mg Oral QPM  . zinc sulfate  220 mg Oral BID   Continuous Infusions: . sodium chloride Stopped (10/19/18 2331)  . lactated ringers     PRN Meds:.sodium chloride, albuterol, ALPRAZolam, magic mouthwash w/lidocaine, metoprolol tartrate, ondansetron (ZOFRAN) IV, oxyCODONE, polyvinyl alcohol, sodium chloride flush, sorbitol, traMADol, white petrolatum  General appearance: alert, cooperative and no distress Neurologic: intact Heart: RRR Lungs: Coarse bilaterally, extensive subcutaneous emphysema bilateral chest, neck, face, eyes Abdomen: Soft, non tender, obese, bowel sounds Extremities: ++ LE edema Wounds: Clean and dry Chest tubes: to suction, no air leak (20 cm and 40 cm suction)  Lab Results: CBC: Recent Labs    10/23/18 0308  WBC 8.6  HGB 11.4*  HCT  36.1*  PLT 351   BMET:  Recent Labs    10/23/18 0308 10/24/18 0320  NA 135 136  K 4.4 4.0  CL 94* 94*  CO2 31 33*  GLUCOSE 110* 99  BUN 19 16  CREATININE 1.10 0.95  CALCIUM 9.0 8.9    CMET: Lab Results  Component Value Date   WBC 8.6 10/23/2018   HGB 11.4 (L) 10/23/2018   HCT 36.1 (L) 10/23/2018   PLT 351 10/23/2018   GLUCOSE 99 10/24/2018   ALT 22 10/08/2018   AST 21 10/08/2018   NA 136 10/24/2018   K 4.0 10/24/2018   CL 94 (L) 10/24/2018   CREATININE 0.95 10/24/2018   BUN 16 10/24/2018   CO2 33 (H) 10/24/2018   INR 1.1 10/22/2018   HGBA1C 6.2 (H) 10/01/2018      PT/INR:  Recent Labs    10/22/18 0940  LABPROT 14.4  INR 1.1   Radiology: No results found.   Assessment/Plan: S/P Procedure(s) (LRB): INSERTION OF LEFT CHEST TUBE (Left)   1. CV-Slightly tachycardic in the low 100's this am. On Lopressor 25  mg bid and Losartan 25 mg daily. SBP in the 90's this am so will not titrate BB. 2. Pulmonary-s/p left 14 French pigtail chest tube by IR 07/13. No recorded chest tube output? Pleura VACs marked this am. CXR this am shows ? resolution of left base pneumothorax, extensive subcutaneous emphysema bilaterally., and no  pneumothorax. Continue chest tubes to suction for now.Encourage incentive spirometer. History of COPD-continue Symbicort, Singulair, and Incruse Ellipta. 3. Volume overload-on Lasix 40 mg daily 4. Expected ABL anemia-H and H yesterday increased to 11.4 and 36.1 5. DM-CBGs 108/103/86. On Metformin 500 mg bid. Pre op HGA1C 6.2   Donielle Liston Alba PA-C 10/24/2018 7:23 AM   Continue both chest tubes to suction patient examined and medical record reviewed,agree with above note. [name] 10/24/2018

## 2018-10-25 ENCOUNTER — Inpatient Hospital Stay (HOSPITAL_COMMUNITY): Payer: Medicare Other

## 2018-10-25 ENCOUNTER — Inpatient Hospital Stay: Payer: Self-pay

## 2018-10-25 LAB — GLUCOSE, CAPILLARY
Glucose-Capillary: 114 mg/dL — ABNORMAL HIGH (ref 70–99)
Glucose-Capillary: 109 mg/dL — ABNORMAL HIGH (ref 70–99)
Glucose-Capillary: 127 mg/dL — ABNORMAL HIGH (ref 70–99)
Glucose-Capillary: 139 mg/dL — ABNORMAL HIGH (ref 70–99)

## 2018-10-25 LAB — BASIC METABOLIC PANEL
Anion gap: 13 (ref 5–15)
BUN: 17 mg/dL (ref 8–23)
CO2: 33 mmol/L — ABNORMAL HIGH (ref 22–32)
Calcium: 9.3 mg/dL (ref 8.9–10.3)
Chloride: 90 mmol/L — ABNORMAL LOW (ref 98–111)
Creatinine, Ser: 1.04 mg/dL (ref 0.61–1.24)
GFR calc Af Amer: >60 mL/min (ref >60)
GFR calc non Af Amer: >60 mL/min (ref >60)
Glucose, Bld: 105 mg/dL — ABNORMAL HIGH (ref 70–99)
Potassium: 3.6 mmol/L (ref 3.5–5.1)
Sodium: 136 mmol/L (ref 135–145)

## 2018-10-25 MED ORDER — DIPHENHYDRAMINE HCL 50 MG/ML IJ SOLN
25.0000 mg | Freq: Once | INTRAMUSCULAR | Status: AC
  Administered 2018-10-25: 25 mg via INTRAVENOUS
  Filled 2018-10-25: qty 1

## 2018-10-25 MED ORDER — SODIUM CHLORIDE 0.9% FLUSH: 10.0000 mL | INTRAVENOUS | Status: DC | PRN

## 2018-10-25 MED ORDER — FUROSEMIDE 10 MG/ML IJ SOLN
40.0000 mg | Freq: Once | INTRAMUSCULAR | Status: AC
  Administered 2018-10-25: 40 mg via INTRAVENOUS
  Filled 2018-10-25: qty 4

## 2018-10-25 MED ORDER — CEFAZOLIN SODIUM-DEXTROSE 2-4 GM/100ML-% IV SOLN
2.0000 g | INTRAVENOUS | Status: AC
  Administered 2018-10-26: 2 g via INTRAVENOUS
  Filled 2018-10-25: qty 100

## 2018-10-25 MED ORDER — POTASSIUM CHLORIDE CRYS ER 20 MEQ PO TBCR
40.0000 meq | EXTENDED_RELEASE_TABLET | Freq: Every day | ORAL | Status: DC
  Administered 2018-10-25 – 2018-10-29 (×5): 40 meq via ORAL
  Filled 2018-10-25 (×5): qty 2

## 2018-10-25 MED ORDER — METOLAZONE 5 MG PO TABS
5.0000 mg | ORAL_TABLET | Freq: Once | ORAL | Status: AC
  Administered 2018-10-25: 08:00:00 5 mg via ORAL
  Filled 2018-10-25: qty 1

## 2018-10-25 MED ORDER — CHLORHEXIDINE GLUCONATE CLOTH 2 % EX PADS
6.0000 | MEDICATED_PAD | Freq: Every day | CUTANEOUS | Status: DC
  Administered 2018-10-25 – 2018-11-01 (×6): 6 via TOPICAL

## 2018-10-25 MED ORDER — SODIUM CHLORIDE 0.9% FLUSH
10.0000 mL | Freq: Two times a day (BID) | INTRAVENOUS | Status: DC
  Administered 2018-10-25 – 2018-10-29 (×7): 10 mL
  Administered 2018-10-29: 20 mL
  Administered 2018-10-30 – 2018-10-31 (×3): 10 mL

## 2018-10-25 NOTE — Progress Notes (Signed)
10 Days Post-Op Procedure(s) (LRB): INSERTION OF LEFT CHEST TUBE (Left) Subjective: subQ air in face, neck better this am  but increased with ADLs CXR w/o pntx With prolonged air leak from L lung causing significant sub Q air, will plan placement of endobronchial valves to reduce air flow thru the site of air leak. Procedure discussed with patient and he knows that the valves will remain for potentially a few weeks then will be removed with another bronchoscopy Objective: Vital signs in last 24 hours: Temp:  [97.1 F (36.2 C)-98.2 F (36.8 C)] 97.1 F (36.2 C) (07/16 0341) Pulse Rate:  [77-108] 89 (07/16 0631) Cardiac Rhythm: Normal sinus rhythm (07/16 0430) Resp:  [11-22] 14 (07/16 0631) BP: (94-147)/(63-90) 135/77 (07/16 0631) SpO2:  [90 %-100 %] 93 % (07/16 0631)  Hemodynamic parameters for last 24 hours:    Intake/Output from previous day: 07/15 0701 - 07/16 0700 In: 1200 [P.O.:1200] Out: 1867 [Urine:1825; Chest Tube:42] Intake/Output this shift: No intake/output data recorded.  Alert , frustrated with subQ air NSR Incisions clean , dry Lab Results: Recent Labs    10/23/18 0308  WBC 8.6  HGB 11.4*  HCT 36.1*  PLT 351   BMET:  Recent Labs    10/24/18 0320 10/25/18 0213  NA 136 136  K 4.0 3.6  CL 94* 90*  CO2 33* 33*  GLUCOSE 99 105*  BUN 16 17  CREATININE 0.95 1.04  CALCIUM 8.9 9.3    PT/INR:  Recent Labs    10/22/18 0940  LABPROT 14.4  INR 1.1   ABG    Component Value Date/Time   PHART 7.376 10/05/2018 1043   HCO3 24.2 10/05/2018 1043   TCO2 24 10/04/2018 1949   ACIDBASEDEF 0.3 10/05/2018 1043   O2SAT 97.9 10/05/2018 1043   CBG (last 3)  Recent Labs    10/24/18 1547 10/24/18 2211 10/25/18 0649  GLUCAP 131* 150* 114*    Assessment/Plan: S/P Procedure(s) (LRB): INSERTION OF LEFT CHEST TUBE (Left) Cont suction to chest tubes, place EBV in am , general anesthesia   LOS: 21 days    Melvin Brewer 10/25/2018

## 2018-10-25 NOTE — Progress Notes (Signed)
Patient ID: Melvin Brewer, male   DOB: [birth year], 68 y.o.   MRN: [id number] EVENING ROUNDS NOTE :     [address]       [place] [address]             [phone]                 10 Days Post-Op Procedure(s) (LRB): INSERTION OF LEFT CHEST TUBE (Left)  Total Length of Stay:  LOS: 21 days  BP (!) 82/60   Pulse 82   Temp 97.9 F (36.6 C) (Oral)   Resp 11   Ht 5\' 9"  (1.753 m)   Wt 95.9 kg   SpO2 93%   BMI 31.22 kg/m   .Intake/Output      07/16 0701 - 07/17 0700   P.O. 480   Total Intake(mL/kg) 480 (5)   Urine (mL/kg/hr) 1250 (1.1)   Chest Tube 0   Total Output 1250   Net -770       Urine Occurrence 1 x     . sodium chloride Stopped (10/19/18 2331)  . [START ON 10/26/2018]  ceFAZolin (ANCEF) IV    . lactated ringers       Lab Results  Component Value Date   WBC 8.6 10/23/2018   HGB 11.4 (L) 10/23/2018   HCT 36.1 (L) 10/23/2018   PLT 351 10/23/2018   GLUCOSE 105 (H) 10/25/2018   ALT 22 10/08/2018   AST 21 10/08/2018   NA 136 10/25/2018   K 3.6 10/25/2018   CL 90 (L) 10/25/2018   CREATININE 1.04 10/25/2018   BUN 17 10/25/2018   CO2 33 (H) 10/25/2018   INR 1.1 10/22/2018   HGBA1C 6.2 (H) 10/01/2018   For bronchial valves tomorrow   Grace Isaac MD  Beeper [phone] Office [phone] 10/25/2018 7:03 PM

## 2018-10-25 NOTE — Progress Notes (Signed)
Peripherally Inserted Central Catheter/Midline Placement  The IV Nurse has discussed with the patient and/or persons authorized to consent for the patient, the purpose of this procedure and the potential benefits and risks involved with this procedure.  The benefits include less needle sticks, lab draws from the catheter, and the patient may be discharged home with the catheter. Risks include, but not limited to, infection, bleeding, blood clot (thrombus formation), and puncture of an artery; nerve damage and irregular heartbeat and possibility to perform a PICC exchange if needed/ordered by physician.  Alternatives to this procedure were also discussed.  Bard Power PICC patient education guide, fact sheet on infection prevention and patient information card has been provided to patient /or left at bedside.    PICC/Midline Placement Documentation  PICC Double Lumen 10/25/18 PICC Right Cephalic 40 cm 0 cm (Active)  Indication for Insertion or Continuance of Line Prolonged intravenous therapies 10/25/18 1158  Exposed Catheter (cm) 0 cm 10/25/18 1158  Site Assessment Clean;Dry;Intact 10/25/18 1158  Lumen #1 Status Flushed;Saline locked;Blood return noted 10/25/18 1158  Lumen #2 Status Flushed;Saline locked;Blood return noted 10/25/18 1158  Dressing Type Transparent;Securing device 10/25/18 1158  Dressing Status Clean;Dry;Intact;Antimicrobial disc in place 10/25/18 1158  Dressing Intervention New dressing 10/25/18 1158  Dressing Change Due 11/01/18 10/25/18 [place] 10/25/2018, 12:00 PM

## 2018-10-25 NOTE — Progress Notes (Signed)
IV team nurse inquiring about PICC vs midline placement. Dr. Nils Pyle paged, gave order for double lumen PICC placement, d/c order for midline insertion.

## 2018-10-25 NOTE — Progress Notes (Signed)
TCTS DAILY ICU PROGRESS NOTE                   [address]            [place] [address]          [phone]   10 Days Post-Op Procedure(s) (LRB): INSERTION OF LEFT CHEST TUBE (Left)  Total Length of Stay:  LOS: 21 days   Subjective: Patient has less eye swelling but more face and neck this am. He states he does not have much pain from chest tubes.  Objective: Vital signs in last 24 hours: Temp:  [97.1 F (36.2 C)-98.2 F (36.8 C)] 97.1 F (36.2 C) (07/16 0341) Pulse Rate:  [77-108] 89 (07/16 0631) Cardiac Rhythm: Normal sinus rhythm (07/16 0430) Resp:  [11-22] 14 (07/16 0631) BP: (94-147)/(63-90) 135/77 (07/16 0631) SpO2:  [90 %-100 %] 93 % (07/16 0631)  Filed Weights   10/22/18 0500 10/23/18 0500 10/24/18 0543  Weight: 97.1 kg 96.9 kg 95.9 kg   Weight change:       Intake/Output from previous day: 07/15 0701 - 07/16 0700 In: 1200 [P.O.:1200] Out: 1867 [Urine:1825; Chest Tube:42]  Intake/Output this shift: No intake/output data recorded.  Current Meds: Scheduled Meds: . aspirin EC  325 mg Oral Daily   Or  . aspirin  324 mg Per Tube Daily  . azithromycin  250 mg Oral Q M,W,F  . bisacodyl  10 mg Oral Daily   Or  . bisacodyl  10 mg Rectal Daily  . budesonide-formoterol  2 puff Inhalation BID  . Chlorhexidine Gluconate Cloth  6 each Topical Daily  . chlorpheniramine-HYDROcodone  5 mL Oral Q12H  . docusate sodium  200 mg Oral Daily  . enoxaparin (LOVENOX) injection  40 mg Subcutaneous Q24H  . feeding supplement (ENSURE ENLIVE)  237 mL Oral TID BM  . fluticasone  1 spray Each Nare Daily  . furosemide  40 mg Oral Daily  . insulin aspart  0-15 Units Subcutaneous TID WC  . loratadine  10 mg Oral Daily  . losartan  25 mg Oral Daily  . mouth rinse  15 mL Mouth Rinse BID  . metFORMIN  500 mg Oral BID WC  . metoprolol tartrate  25 mg Oral BID   Or  . metoprolol tartrate  25 mg Per Tube BID  . montelukast  10 mg Oral QPM  . multivitamin with  minerals  1 tablet Oral Daily  . pantoprazole  40 mg Oral Daily  . potassium chloride  20 mEq Oral Daily  . rosuvastatin  40 mg Oral Daily  . sodium chloride flush  3 mL Intravenous Q12H  . sodium chloride flush  5 mL Intracatheter Q8H  . umeclidinium bromide  1 puff Inhalation Daily  . valACYclovir  500 mg Oral QPM  . zinc sulfate  220 mg Oral BID   Continuous Infusions: . sodium chloride Stopped (10/19/18 2331)  . lactated ringers     PRN Meds:.sodium chloride, albuterol, ALPRAZolam, magic mouthwash w/lidocaine, metoprolol tartrate, ondansetron (ZOFRAN) IV, oxyCODONE, polyvinyl alcohol, sodium chloride flush, sorbitol, traMADol, white petrolatum  General appearance: He is sitting in the chair talking to his wife this am. He is alert, cooperative and no distress Heart: RRR Lungs: Coarse bilaterally (because of chest tubes), extensive subcutaneous emphysema bilateral chest, neck, face, minimally around eyes Extremities: ++ LE edema Wounds: Clean and dry Chest tubes: to suction, no air leak (40 cm and 40 cm suction)  Lab Results:  CBC: Recent Labs    10/23/18 0308  WBC 8.6  HGB 11.4*  HCT 36.1*  PLT 351   BMET:  Recent Labs    10/24/18 0320 10/25/18 0213  NA 136 136  K 4.0 3.6  CL 94* 90*  CO2 33* 33*  GLUCOSE 99 105*  BUN 16 17  CREATININE 0.95 1.04  CALCIUM 8.9 9.3    CMET: Lab Results  Component Value Date   WBC 8.6 10/23/2018   HGB 11.4 (L) 10/23/2018   HCT 36.1 (L) 10/23/2018   PLT 351 10/23/2018   GLUCOSE 105 (H) 10/25/2018   ALT 22 10/08/2018   AST 21 10/08/2018   NA 136 10/25/2018   K 3.6 10/25/2018   CL 90 (L) 10/25/2018   CREATININE 1.04 10/25/2018   BUN 17 10/25/2018   CO2 33 (H) 10/25/2018   INR 1.1 10/22/2018   HGBA1C 6.2 (H) 10/01/2018     PT/INR:  Recent Labs    10/22/18 0940  LABPROT 14.4  INR 1.1   Radiology: No results found.   Assessment/Plan: S/P Procedure(s) (LRB): INSERTION OF LEFT CHEST TUBE (Left)   1. CV-Slightly  tachycardic in the low 100's this am. On Lopressor 25 mg bid and Losartan 25 mg daily.  2. Pulmonary-s/p left 14 French pigtail chest tube by IR 07/13.  Chest tube output with 42 cc last 24 hours (recorded as all output.  CXR this am appears stable ( extensive subcutaneous emphysema bilaterally., and no  Pneumothorax). Continue chest tubes to suction for now. Dr. Prescott Gum considering placement of EBVs, possibly in the am. Encourage incentive spirometer. History of COPD-continue Symbicort, Singulair, and Incruse Ellipta. 3. Volume overload-He was given Zaroxolyn and oral Lasix yesterday. On Lasix 40 mg daily but will give IV this am 4. Expected ABL anemia-H and H yesterday increased to 11.4 and 36.1 5. DM-CBGs 131/150/114. On Metformin 500 mg bid. Pre op HGA1C 6.2   Donnella Morford Liston Alba PA-C 10/25/2018 7:46 AM

## 2018-10-25 NOTE — Progress Notes (Signed)
Pt stating he feels like swelling is increasing, anxiety resulting. Pt given xanax. Dr. Nils Pyle made aware, gave order for 25mg  Benadryl IV x1 dose now.

## 2018-10-25 NOTE — Progress Notes (Signed)
Referring Physician(s): Dr Darcey Nora  Supervising Physician: Markus Daft  Patient Status:  [facility] - In-pt  Chief Complaint:  Left pigtail chest tube placed in IR 7/13  Subjective:  Facial swelling is less Swelling down at night and back in am per pt Chest tubes intact No air leaks OP minimal from IR chest tube-- (more central placement)  CXR pending  Allergies: Atorvastatin and Sulfa antibiotics  Medications: Prior to Admission medications   Medication Sig Start Date End Date Taking? Authorizing Provider  albuterol (PROVENTIL HFA;VENTOLIN HFA) 108 (90 BASE) MCG/ACT inhaler Inhale 2 puffs into the lungs every 6 (six) hours as needed for wheezing or shortness of breath.   Yes [provider]  albuterol (PROVENTIL) (2.5 MG/3ML) 0.083% nebulizer solution Inhale 2.5 mg into the lungs every 6 (six) hours as needed for wheezing or shortness of breath. 04/27/18  Yes [provider]  aspirin EC 81 MG tablet Take 81 mg by mouth every evening.    Yes [provider]  azelastine (ASTELIN) 0.1 % nasal spray Place 1 spray into both nostrils 2 (two) times daily. Use in each nostril as directed   Yes [provider]  azithromycin (ZITHROMAX) 250 MG tablet Take 250 mg by mouth every Monday, Wednesday, and Friday.   Yes [provider]  Cholecalciferol (VITAMIN D3) 50 MCG (2000 UT) TABS Take 2,000 Units by mouth every morning.    Yes [provider]  fexofenadine (ALLEGRA) 180 MG tablet Take 180 mg by mouth daily. 05/27/18  Yes [provider]  fluticasone (FLONASE) 50 MCG/ACT nasal spray Place 1 spray into both nostrils daily.   Yes [provider]  losartan (COZAAR) 25 MG tablet Take 25 mg by mouth daily. 07/08/18  Yes [provider]  metFORMIN (GLUCOPHAGE) 500 MG tablet Take 500 mg by mouth 2 (two) times a day.  05/04/17  Yes [provider]  metoprolol (LOPRESSOR) 50 MG tablet Take 50 mg by mouth 2 (two)  times daily.   Yes [provider]  montelukast (SINGULAIR) 10 MG tablet Take 10 mg by mouth every evening. 07/02/18  Yes [provider]  rosuvastatin (CRESTOR) 40 MG tablet Take 40 mg by mouth daily.   Yes [provider]  SYMBICORT 160-4.5 MCG/ACT inhaler Inhale 2 puffs into the lungs 2 (two) times a day. 05/07/18  Yes [provider]  Tiotropium Bromide Monohydrate (SPIRIVA RESPIMAT) 2.5 MCG/ACT AERS Inhale 2 puffs into the lungs daily.  03/06/17  Yes [provider]  valACYclovir (VALTREX) 500 MG tablet Take 500 mg by mouth every evening. 06/13/18  Yes [provider]  DENTA 5000 PLUS 1.1 % CREA dental cream Take 1 application by mouth at bedtime. Brush teeth for at least 1 min 05/09/17   [provider]  sildenafil (REVATIO) 20 MG tablet Take 20-100 mg by mouth daily as needed for erectile dysfunction. 12/07/17   [provider]     Vital Signs: BP 135/77    Pulse 89    Temp (!) 97.1 F (36.2 C) (Oral)    Resp 14    Ht 5\' 9"  (1.753 m)    Wt 211 lb 6.7 oz (95.9 kg)    SpO2 93%    BMI 31.22 kg/m   Physical Exam Vitals signs reviewed.  HENT:     Head:     Comments: Facial swelling less today Eyes:     Comments: Able to see both eyes today- less swelling  Skin:  General: Skin is warm and dry.     Comments: Sites of CTs are clean and dry NT no bleeding  OP IR CT: clear yellow- ~90 cc  Neurological:     Mental Status: He is alert and oriented to person, place, and time.  Psychiatric:        Behavior: Behavior normal.     Imaging: Ct Chest Wo Contrast  Result Date: 10/22/2018 CLINICAL DATA:  Known pneumothorax for follow-up.  CABG 10/04/2018. EXAM: CT CHEST WITHOUT CONTRAST TECHNIQUE: Multidetector CT imaging of the chest was performed following the standard protocol without IV contrast. COMPARISON:  CT 09/12/2018 and chest x-ray today. FINDINGS: Cardiovascular: Heart is normal size. Evidence of previous CABG.  Thoracic aorta is normal in caliber. Mild calcified plaque involving the thoracic aorta. Mediastinum/Nodes: No pneumomediastinum. No mediastinal or hilar adenopathy. Lungs/Pleura: Known small bilateral pneumothoraces are present. Left-sided chest tube is present with tip over the posterior left lung base adjacent the diaphragm. Posterior bibasilar atelectasis present. There is atelectasis over the lingula adjacent the left-sided chest tube. No significant effusion. Upper Abdomen: Calcified plaque over the abdominal aorta. Small focus of air posterior to the distal stomach on the most inferior images as this is incompletely evaluated. Musculoskeletal: Extensive subcutaneous emphysema throughout the thorax unchanged from recent chest radiograph. Mild amount of fluid in air superficial to the median sternotomy. Subcutaneous fluid throughout the thorax. Degenerative change of the spine. Schmorl's node over the superior endplate of a lower thoracic vertebral body. IMPRESSION: 1. Known small bilateral pneumothoraces with left-sided chest tube in place as described. Mild posterior dependent atelectasis and lingular atelectasis. Known pneumomediastinum and stable extensive subcutaneous emphysema and fluid over the thorax. 2. Recent CABG. Mild fluid and air immediately superficial to the sternotomy site likely postsurgical changes and less likely infection. 3.  Aortic Atherosclerosis (ICD10-I70.0). Electronically Signed   By: Marin Olp M.D.   On: 10/22/2018 13:40   Dg Chest Port 1 View  Result Date: 10/24/2018 CLINICAL DATA:  Follow-up pneumothorax EXAM: PORTABLE CHEST 1 VIEW COMPARISON:  10/23/2018 FINDINGS: Two basilar left-sided chest tubes are again identified and stable. No definitive pneumothorax is identified. Considerable subcutaneous edema is noted throughout the chest. No focal infiltrate is seen. Mild left basilar atelectasis is again noted. Postsurgical changes are seen. IMPRESSION: Two chest tubes in the  left base. No definitive recurrent pneumothorax is seen although the lung markings are somewhat obscured by significant subcutaneous emphysema. Electronically Signed   By: Inez Catalina M.D.   On: 10/24/2018 07:21   Dg Chest Port 1 View  Result Date: 10/23/2018 CLINICAL DATA:  Shortness of breath. EXAM: PORTABLE CHEST 1 VIEW COMPARISON:  Radiograph of October 22, 2018. FINDINGS: Stable cardiomediastinal silhouette. Stable extensive subcutaneous emphysema is noted bilaterally. Stable position of previously placed left basilar chest tube. New pigtail catheter is seen in left lung base. Left basilar pneumothorax noted on prior exam is no longer visualized. Bony thorax is unremarkable. IMPRESSION: Interval placement of new pigtail catheter into left lung base. Left basilar pneumothorax noted on prior exam is not well visualized. No definite pneumothorax is seen currently, although this may be obscured due to extensive overlying subcutaneous emphysema. Electronically Signed   By: Marijo Conception M.D.   On: 10/23/2018 07:17   Dg Chest Port 1 View  Result Date: 10/22/2018 CLINICAL DATA:  68 year old male with left-sided pneumothorax, chest tube in place EXAM: PORTABLE CHEST 1 VIEW COMPARISON:  Prior chest x-ray 10/21/2018 FINDINGS: Stable left-sided pneumothorax. Left basilar chest  tube remains in good position. Tiny right-sided pneumothorax also remains unchanged. Extensive subcutaneous emphysema throughout all of the visualized soft tissues. Patient is status post median sternotomy with evidence of multivessel CABG. Cardiac and mediastinal contours remain unchanged. Atherosclerotic calcifications visualized in the thoracic aorta. The underlying lungs are difficult to assess given the severity of overlying subcutaneous emphysema. There may be scattered areas of patchy airspace opacification. IMPRESSION: 1. Persistent extensive severe subcutaneous emphysema which significantly limits evaluation of the underlying lungs.  2. Stable small left pneumothorax with chest tube in place. 3. Stable trace right basilar pneumothorax. 4. Possible multifocal patchy airspace opacities versus artifact from overlying subcutaneous emphysema. Electronically Signed   By: Jacqulynn Cadet M.D.   On: 10/22/2018 07:56   Ct Perc Pleural Drain W/indwell Cath W/img Guide  Result Date: 10/22/2018 CLINICAL DATA:  Persistent progressive subcutaneous emphysema after median sternotomy despite no evidence of air leak from surgical left chest tube. EXAM: CT GUIDED LEFT CHEST TUBE PLACEMENT ANESTHESIA/SEDATION: Intravenous Fentanyl 161mcg and Versed 1.5mg  were administered as conscious sedation during continuous monitoring of the patient's level of consciousness and physiological / cardiorespiratory status by the radiology RN, with a total moderate sedation time of of 18 minutes. PROCEDURE: The procedure, risks, benefits, and alternatives were explained to the patient. Questions regarding the procedure were encouraged and answered. The patient understands and consents to the procedure. Select axial scans through the thorax were obtained. An appropriate skin entry site to access the residual left lateral pneumothorax was identified and marked. The operative field was prepped with chlorhexidinein a sterile fashion, and a sterile drape was applied covering the operative field. A sterile gown and sterile gloves were used for the procedure. Local anesthesia was provided with 1% Lidocaine. Under CT fluoroscopic guidance, a 18 gauge x 10 cm trocar needle was advanced into the residual left lateral pneumothorax at the lung base. An Amplatz guidewire advanced easily, its position confirmed on CT. Tract dilated to facilitate placement of a 14 French pigtail drain catheter, placed laterally in the left hemithorax. Catheter position was confirmed on CT. Catheter secured externally with 0 Prolene suture and StatLock and placed to Pleur-evac drainage. The patient tolerated  the procedure well. COMPLICATIONS: None immediate FINDINGS: Small anterior and lateral left residual pneumothorax was identified. 14 French pigtail drain catheter placed as detailed above. IMPRESSION: 1. Technically successful CT-guided left chest tube placement. Electronically Signed   By: Lucrezia Europe M.D.   On: 10/22/2018 16:07    Labs:  CBC: Recent Labs    10/17/18 0238 10/18/18 0226 10/20/18 0246 10/23/18 0308  WBC 12.5* 10.3 8.5 8.6  HGB 10.6* 10.9* 10.9* 11.4*  HCT 33.2* 33.6* 33.9* 36.1*  PLT 272 286 323 351    COAGS: Recent Labs    10/01/18 1003 10/04/18 1326 10/22/18 0940  INR 1.1 1.6* 1.1  APTT 24 32  --     BMP: Recent Labs    10/20/18 0246 10/23/18 0308 10/24/18 0320 10/25/18 0213  NA 135 135 136 136  K 3.7 4.4 4.0 3.6  CL 95* 94* 94* 90*  CO2 31 31 33* 33*  GLUCOSE 96 110* 99 105*  BUN 15 19 16 17   CALCIUM 8.6* 9.0 8.9 9.3  CREATININE 1.05 1.10 0.95 1.04  GFRNONAA >60 >60 >60 >60  GFRAA >60 >60 >60 >60    LIVER FUNCTION TESTS: Recent Labs    10/01/18 1003 10/08/18 0303  BILITOT 0.6 0.6  AST 24 21  ALT 50* 22  ALKPHOS 43 33*  PROT 5.9* 5.3*  ALBUMIN 3.7 2.7*    Assessment and Plan:  CABG redo 7/6 PTX and sub cu air apparent Additional Left pigtail chest tube placed in IR 7/14 Will follow Plan per [facility]   Electronically Signed: Lavonia Drafts, PA-C 10/25/2018, 7:54 AM   I spent a total of 15 Minutes at the the patient's bedside AND on the patient's hospital floor or unit, greater than 50% of which was counseling/coordinating care for left pigtail CT placed

## 2018-10-25 NOTE — Plan of Care (Signed)
  Problem: Activity: Goal: Risk for activity intolerance will decrease Outcome: Progressing   Problem: Cardiac: Goal: Will achieve and/or maintain hemodynamic stability Outcome: Progressing   Problem: Clinical Measurements: Goal: Postoperative complications will be avoided or minimized Outcome: Progressing   Problem: Respiratory: Goal: Respiratory status will improve Outcome: Progressing   Problem: Skin Integrity: Goal: Wound healing without signs and symptoms of infection Outcome: Progressing Goal: Risk for impaired skin integrity will decrease Outcome: Progressing   Problem: Urinary Elimination: Goal: Ability to achieve and maintain adequate renal perfusion and functioning will improve Outcome: Progressing   Problem: Education: Goal: Knowledge of General Education information will improve Description: Including pain rating scale, medication(s)/side effects and non-pharmacologic comfort measures Outcome: Progressing   Problem: Health Behavior/Discharge Planning: Goal: Ability to manage health-related needs will improve Outcome: Progressing   Problem: Clinical Measurements: Goal: Ability to maintain clinical measurements within normal limits will improve Outcome: Progressing Goal: Will remain free from infection Outcome: Progressing Goal: Diagnostic test results will improve Outcome: Progressing Goal: Respiratory complications will improve Outcome: Progressing Goal: Cardiovascular complication will be avoided Outcome: Progressing   Problem: Activity: Goal: Risk for activity intolerance will decrease Outcome: Progressing   Problem: Nutrition: Goal: Adequate nutrition will be maintained Outcome: Progressing   Problem: Coping: Goal: Level of anxiety will decrease Outcome: Progressing   Problem: Elimination: Goal: Will not experience complications related to bowel motility Outcome: Progressing Goal: Will not experience complications related to urinary  retention Outcome: Progressing   Problem: Pain Managment: Goal: General experience of comfort will improve Outcome: Progressing   Problem: Safety: Goal: Ability to remain free from injury will improve Outcome: Progressing   Problem: Skin Integrity: Goal: Risk for impaired skin integrity will decrease Outcome: Progressing   

## 2018-10-25 NOTE — Progress Notes (Signed)
Chart reviewed for LOS; B Perris Conwell RN,MHA,BSN Advanced Care Supervisor [phone] 

## 2018-10-26 ENCOUNTER — Inpatient Hospital Stay (HOSPITAL_COMMUNITY): Payer: Medicare Other

## 2018-10-26 ENCOUNTER — Inpatient Hospital Stay (HOSPITAL_COMMUNITY): Payer: Medicare Other | Admitting: Anesthesiology

## 2018-10-26 ENCOUNTER — Encounter (HOSPITAL_COMMUNITY)
Admission: RE | Disposition: A | Discharge status: Home / Self Care | Payer: Self-pay | Source: Home / Self Care | Attending: Cardiothoracic Surgery

## 2018-10-26 ENCOUNTER — Encounter (HOSPITAL_COMMUNITY): Payer: Self-pay | Admitting: Anesthesiology

## 2018-10-26 DIAGNOSIS — J95811 Postprocedural pneumothorax: Secondary | ICD-10-CM

## 2018-10-26 HISTORY — PX: VIDEO BRONCHOSCOPY WITH INSERTION OF INTERBRONCHIAL VALVE (IBV): SHX6178

## 2018-10-26 LAB — CBC
HCT: 35.3 % — ABNORMAL LOW (ref 39.0–52.0)
Hemoglobin: 11.4 g/dL — ABNORMAL LOW (ref 13.0–17.0)
MCH: 31.7 pg (ref 26.0–34.0)
MCHC: 32.3 g/dL (ref 30.0–36.0)
MCV: 98.1 fL (ref 80.0–100.0)
Platelets: 312 10*3/uL (ref 150–400)
RBC: 3.6 MIL/uL — ABNORMAL LOW (ref 4.22–5.81)
RDW: 13.5 % (ref 11.5–15.5)
WBC: 8.9 10*3/uL (ref 4.0–10.5)
nRBC: 0 % (ref 0.0–0.2)

## 2018-10-26 LAB — BASIC METABOLIC PANEL
Anion gap: 14 (ref 5–15)
BUN: 19 mg/dL (ref 8–23)
CO2: 36 mmol/L — ABNORMAL HIGH (ref 22–32)
Calcium: 9.1 mg/dL (ref 8.9–10.3)
Chloride: 85 mmol/L — ABNORMAL LOW (ref 98–111)
Creatinine, Ser: 1.23 mg/dL (ref 0.61–1.24)
GFR calc Af Amer: >60 mL/min (ref >60)
GFR calc non Af Amer: 59 mL/min — ABNORMAL LOW (ref >60)
Glucose, Bld: 96 mg/dL (ref 70–99)
Potassium: 2.7 mmol/L — CL (ref 3.5–5.1)
Sodium: 135 mmol/L (ref 135–145)

## 2018-10-26 LAB — GLUCOSE, CAPILLARY
Glucose-Capillary: 102 mg/dL — ABNORMAL HIGH (ref 70–99)
Glucose-Capillary: 118 mg/dL — ABNORMAL HIGH (ref 70–99)
Glucose-Capillary: 186 mg/dL — ABNORMAL HIGH (ref 70–99)
Glucose-Capillary: 106 mg/dL — ABNORMAL HIGH (ref 70–99)

## 2018-10-26 SURGERY — BRONCHOSCOPY, FLEXIBLE, WITH INTRABRONCHIAL VALVE INSERTION
Anesthesia: General | Site: Chest

## 2018-10-26 MED ORDER — SUGAMMADEX SODIUM 200 MG/2ML IV SOLN
INTRAVENOUS | Status: DC | PRN
  Administered 2018-10-26: 400 mg via INTRAVENOUS

## 2018-10-26 MED ORDER — SUCCINYLCHOLINE CHLORIDE 200 MG/10ML IV SOSY
PREFILLED_SYRINGE | INTRAVENOUS | Status: DC | PRN
  Administered 2018-10-26: 120 mg via INTRAVENOUS

## 2018-10-26 MED ORDER — FENTANYL CITRATE (PF) 250 MCG/5ML IJ SOLN
INTRAMUSCULAR | Status: AC
  Filled 2018-10-26: qty 5

## 2018-10-26 MED ORDER — PHENYLEPHRINE 40 MCG/ML (10ML) SYRINGE FOR IV PUSH (FOR BLOOD PRESSURE SUPPORT)
PREFILLED_SYRINGE | INTRAVENOUS | Status: DC | PRN
  Administered 2018-10-26: 80 ug via INTRAVENOUS

## 2018-10-26 MED ORDER — DEXAMETHASONE SODIUM PHOSPHATE 10 MG/ML IJ SOLN
INTRAMUSCULAR | Status: DC | PRN
  Administered 2018-10-26: 10 mg via INTRAVENOUS

## 2018-10-26 MED ORDER — POTASSIUM CHLORIDE 10 MEQ/50ML IV SOLN
10.0000 meq | INTRAVENOUS | Status: AC
  Administered 2018-10-26 (×2): 10 meq via INTRAVENOUS
  Filled 2018-10-26 (×2): qty 50

## 2018-10-26 MED ORDER — PROPOFOL 10 MG/ML IV BOLUS
INTRAVENOUS | Status: AC
  Filled 2018-10-26: qty 20

## 2018-10-26 MED ORDER — EPINEPHRINE PF 1 MG/ML IJ SOLN
INTRAMUSCULAR | Status: DC | PRN
  Administered 2018-10-26: 1 mg

## 2018-10-26 MED ORDER — FENTANYL CITRATE (PF) 250 MCG/5ML IJ SOLN
INTRAMUSCULAR | Status: DC | PRN
  Administered 2018-10-26 (×3): 50 ug via INTRAVENOUS

## 2018-10-26 MED ORDER — 0.9 % SODIUM CHLORIDE (POUR BTL) OPTIME
TOPICAL | Status: DC | PRN
  Administered 2018-10-26: 08:00:00 1000 mL

## 2018-10-26 MED ORDER — PROPOFOL 10 MG/ML IV BOLUS
INTRAVENOUS | Status: DC | PRN
  Administered 2018-10-26: 100 mg via INTRAVENOUS
  Administered 2018-10-26: 30 mg via INTRAVENOUS

## 2018-10-26 MED ORDER — POTASSIUM CHLORIDE 10 MEQ/50ML IV SOLN
10.0000 meq | INTRAVENOUS | Status: AC
  Administered 2018-10-26 (×3): 10 meq via INTRAVENOUS
  Filled 2018-10-26: qty 50

## 2018-10-26 MED ORDER — SODIUM CHLORIDE 0.9 % IV SOLN
INTRAVENOUS | Status: DC | PRN
  Administered 2018-10-26: 30 ug/min via INTRAVENOUS

## 2018-10-26 MED ORDER — LIDOCAINE 2% (20 MG/ML) 5 ML SYRINGE
INTRAMUSCULAR | Status: DC | PRN
  Administered 2018-10-26: 50 mg via INTRAVENOUS

## 2018-10-26 MED ORDER — ROCURONIUM BROMIDE 10 MG/ML (PF) SYRINGE
PREFILLED_SYRINGE | INTRAVENOUS | Status: DC | PRN
  Administered 2018-10-26: 50 mg via INTRAVENOUS
  Administered 2018-10-26: 20 mg via INTRAVENOUS

## 2018-10-26 SURGICAL SUPPLY — 39 items
CANISTER SUCT 3000ML PPV (MISCELLANEOUS) ×3 IMPLANT
CATH BALLN 4FR (CATHETERS) ×2 IMPLANT
CATH LOADER DEPLOYMENT HUD (CATHETERS) ×2 IMPLANT
CONT SPEC 4OZ CLIKSEAL STRL BL (MISCELLANEOUS) ×3 IMPLANT
COVER BACK TABLE 60X90IN (DRAPES) ×3 IMPLANT
COVER WAND RF STERILE (DRAPES) ×1 IMPLANT
DRSG AQUACEL AG ADV 3.5X14 (GAUZE/BANDAGES/DRESSINGS) ×3 IMPLANT
FILTER STRAW FLUID ASPIR (MISCELLANEOUS) ×2 IMPLANT
FORCEPS BIOP RJ4 1.8 (CUTTING FORCEPS) ×2 IMPLANT
FORCEPS BIOP SUPERTRX PREMAR (INSTRUMENTS) ×2 IMPLANT
GAUZE SPONGE 4X4 12PLY STRL (GAUZE/BANDAGES/DRESSINGS) ×3 IMPLANT
GLOVE BIO SURGEON STRL SZ7.5 (GLOVE) ×3 IMPLANT
GOWN STRL REUS W/ TWL XL LVL3 (GOWN DISPOSABLE) ×1 IMPLANT
GOWN STRL REUS W/TWL XL LVL3 (GOWN DISPOSABLE) ×3
KIT AIRWAY SIZING W/B5-23 BALL (VALVE) ×2 IMPLANT
KIT CLEAN ENDO COMPLIANCE (KITS) ×3 IMPLANT
KIT TURNOVER KIT B (KITS) ×3 IMPLANT
MARKER SKIN DUAL TIP RULER LAB (MISCELLANEOUS) ×3 IMPLANT
NS IRRIG 1000ML POUR BTL (IV SOLUTION) ×3 IMPLANT
OIL SILICONE PENTAX (PARTS (SERVICE/REPAIRS)) IMPLANT
PAD ARMBOARD 7.5X6 YLW CONV (MISCELLANEOUS) ×8 IMPLANT
SOLUTION ANTI FOG 6CC (MISCELLANEOUS) ×3 IMPLANT
STOPCOCK MORSE 400PSI 3WAY (MISCELLANEOUS) ×3 IMPLANT
SYR 10ML LL (SYRINGE) ×3 IMPLANT
SYR 20ML ECCENTRIC (SYRINGE) ×3 IMPLANT
SYR 3ML LL SCALE MARK (SYRINGE) ×2 IMPLANT
TOWEL GREEN STERILE (TOWEL DISPOSABLE) ×3 IMPLANT
TOWEL GREEN STERILE FF (TOWEL DISPOSABLE) ×3 IMPLANT
TOWEL NATURAL 4PK STERILE (DISPOSABLE) ×3 IMPLANT
TRAP SPECIMEN MUCOUS 40CC (MISCELLANEOUS) ×3 IMPLANT
TUBE CONNECTING 20'X1/4 (TUBING) ×1
TUBE CONNECTING 20X1/4 (TUBING) ×2 IMPLANT
VALVE BIOPSY  SINGLE USE (MISCELLANEOUS) ×2
VALVE BIOPSY SINGLE USE (MISCELLANEOUS) IMPLANT
VALVE DISPOSABLE (MISCELLANEOUS) ×3 IMPLANT
VALVE IN CARTRIDGE 5MM HUD (Valve) IMPLANT
VALVE IN CARTRIDGE 7MM HUD (Valve) ×4 IMPLANT
VALVE IN CARTRIDGE 9MM HUD (Valve) ×4 IMPLANT
VALVE SUCTION BRONCHIO DISP (MISCELLANEOUS) ×2 IMPLANT

## 2018-10-26 NOTE — Progress Notes (Signed)
Pre Procedure note for inpatients:   Melvin Brewer has been scheduled for Procedure(s): VIDEO BRONCHOSCOPY WITH INSERTION OF INTERBRONCHIAL VALVE (IBV) (N/A) today. The various methods of treatment have been discussed with the patient. After consideration of the risks, benefits and treatment options the patient has consented to the planned procedure.   The patient has been seen and labs reviewed. There are no changes in the patient's condition to prevent proceeding with the planned procedure today.  Recent labs:  Lab Results  Component Value Date   WBC 8.9 10/26/2018   HGB 11.4 (L) 10/26/2018   HCT 35.3 (L) 10/26/2018   PLT 312 10/26/2018   GLUCOSE 96 10/26/2018   ALT 22 10/08/2018   AST 21 10/08/2018   NA 135 10/26/2018   K 2.7 (LL) 10/26/2018   CL 85 (L) 10/26/2018   CREATININE 1.23 10/26/2018   BUN 19 10/26/2018   CO2 36 (H) 10/26/2018   INR 1.1 10/22/2018   HGBA1C 6.2 (H) 10/01/2018    Len Childs, MD 10/26/2018 7:46 AM

## 2018-10-26 NOTE — Op Note (Signed)
NAME: Melvin Brewer, Melvin Brewer MEDICAL RECORD [id number] ACCOUNT [id number] DATE OF BIRTH:09-02-1950 FACILITY: [facility] LOCATION: [facility] PHYSICIAN:PETER VAN TRIGT III, MD  OPERATIVE REPORT  DATE OF PROCEDURE:  10/26/2018  OPERATION:  Placement of endobronchial valves with bronchoscopy to the left upper lobe, 9 mm valve to the anterior apical segment, 7 mm valve to the lingular segments.  SURGEON:  Ivin Poot, MD  ANESTHESIA:  General.  PREOPERATIVE DIAGNOSIS:  Prolonged air leak after redo sternotomy, redo coronary artery bypass grafting with significant subcutaneous emphysema despite chest tube drainage.  POSTOPERATIVE DIAGNOSIS:  Prolonged air leak after redo sternotomy, redo coronary artery bypass grafting with significant subcutaneous emphysema despite chest tube drainage.  DESCRIPTION OF PROCEDURE:  After informed consent was obtained in preop holding and informed consent documented and all questions addressed, the patient was brought back to the operating room where general anesthesia was induced with endotracheal  intubation.  A proper time-out was performed.  A bronchoscope was inserted.  The distal trachea and carina were clear.  There were some thick secretions in the left upper lobe which were cleared with irrigation and cultures were sent.  There were no  endobronchial lesions noted in the segments of the left upper lobe, left lower lobe, right upper lobe or right middle and lower lobes.  Using the bronchoscopic technique, the segments to the apical anterior airways of the left upper lobe were measured and a 9 mm valve was inserted transbronchially.  This was difficult and time consuming because of the angulation of the upper lobe apical  segments.  There was good coverage of the areas.  Next, a 7 mm valve was placed to cover the lingular segments and this was more straightforward because of the better exposure and direct pathway to the lingular segments.  There was good  coverage of these segments with the 7 mm valve.  The bronchoscope  was then removed.  The patient tolerated the procedure well.  He was extubated and returned to recovery room.  TN/NUANCE  D:10/26/2018 T:10/26/2018 JOB:[id number]

## 2018-10-26 NOTE — Transfer of Care (Signed)
Immediate Anesthesia Transfer of Care Note  Patient: Melvin Brewer  Procedure(s) Performed: VIDEO BRONCHOSCOPY WITH INSERTION OF INTERBRONCHIAL VALVES (IBV) (N/A Chest)  Patient Location: PACU  Anesthesia Type:General  Level of Consciousness: awake, alert  and oriented  Airway & Oxygen Therapy: Patient Spontanous Breathing and Patient connected to nasal cannula oxygen  Post-op Assessment: Report given to RN and Post -op Vital signs reviewed and stable  Post vital signs: Reviewed and stable  Last Vitals:  Vitals Value Taken Time  BP 141/76 10/26/18 0916  Temp    Pulse 103 10/26/18 0918  Resp 20 10/26/18 0918  SpO2 100 % 10/26/18 0918  Vitals shown include unvalidated device data.  Last Pain:  Vitals:   10/26/18 0400  TempSrc:   PainSc: 0-No pain      Patients Stated Pain Goal: 0 (54/00/86 7619)  Complications: No apparent anesthesia complications

## 2018-10-26 NOTE — Anesthesia Preprocedure Evaluation (Addendum)
Anesthesia Evaluation  Patient identified by MRN, date of birth, ID band Patient awake    Reviewed: Allergy & Precautions, NPO status , Patient's Chart, lab work & pertinent test results  Airway Mallampati: III  TM Distance: <3 FB Neck ROM: Full    Dental  (+) Teeth Intact, Dental Advisory Given, Poor Dentition   Pulmonary COPD,  COPD inhaler, former smoker,     + decreased breath sounds      Cardiovascular hypertension, Pt. on medications + CAD and + CABG   Rhythm:Regular Rate:Tachycardia     Neuro/Psych negative neurological ROS     GI/Hepatic Neg liver ROS, hiatal hernia, GERD  ,  Endo/Other  diabetes, Type 2, Oral Hypoglycemic Agents  Renal/GU      Musculoskeletal  (+) Arthritis ,   Abdominal (+) + obese,   Peds  Hematology negative hematology ROS (+)   Anesthesia Other Findings   Reproductive/Obstetrics                            Echo: - Left Ventricle: The left ventricle is unchanged from pre-bypass. has normal systolic function, with an ejection fraction of 55%. - Aorta: The aorta appears unchanged from pre-bypass. - Left Atrial Appendage: The left atrial appendage appears unchanged from pre-bypass. - Aortic Valve: The aortic valve appears unchanged from pre-bypass. - Mitral Valve: The mitral valve appears unchanged from pre-bypass. - Tricuspid Valve: The tricuspid valve appears unchanged from pre-bypass.  Anesthesia Physical Anesthesia Plan  ASA: III  Anesthesia Plan: General   Post-op Pain Management:    Induction: Intravenous  PONV Risk Score and Plan: 3 and Ondansetron, Dexamethasone and Treatment may vary due to age or medical condition  Airway Management Planned: Oral ETT and Video Laryngoscope Planned  Additional Equipment: None  Intra-op Plan:   Post-operative Plan: Extubation in OR  Informed Consent: I have reviewed the patients History and Physical,  chart, labs and discussed the procedure including the risks, benefits and alternatives for the proposed anesthesia with the patient or authorized representative who has indicated his/her understanding and acceptance.     Dental advisory given  Plan Discussed with: CRNA  Anesthesia Plan Comments:        Anesthesia Quick Evaluation

## 2018-10-26 NOTE — Anesthesia Procedure Notes (Signed)
Procedure Name: Intubation Date/Time: 10/26/2018 7:46 AM Performed by: Marsa Aris, CRNA Pre-anesthesia Checklist: Patient identified, Emergency Drugs available, Suction available and Patient being monitored Patient Re-evaluated:Patient Re-evaluated prior to induction Oxygen Delivery Method: Circle System Utilized Preoxygenation: Pre-oxygenation with 100% oxygen Induction Type: IV induction and Rapid sequence Laryngoscope Size: Glidescope and 4 Grade View: Grade I Tube type: Oral Tube size: 9.0 mm Number of attempts: 1 Airway Equipment and Method: Stylet and Bite block Placement Confirmation: ETT inserted through vocal cords under direct vision,  positive ETCO2 and breath sounds checked- equal and bilateral Secured at: 23 cm Tube secured with: Tape Dental Injury: Teeth and Oropharynx as per pre-operative assessment  Comments: No change in dentition from pre-procedure

## 2018-10-26 NOTE — Brief Op Note (Signed)
10/26/2018  9:07 AM  PATIENT:  Mickel Duhamel  68 y.o. male  PRE-OPERATIVE DIAGNOSIS:  AIR LEAK  POST-OPERATIVE DIAGNOSIS:  AIR LEAK  PROCEDURE:  Procedure(s): VIDEO BRONCHOSCOPY WITH INSERTION OF INTERBRONCHIAL VALVES (IBV) (N/A)   7 mm valve  LUL apical, 39mm valve Lingula  SURGEON:  Surgeon(s) and Role:    Ivin Poot, MD - Primary    * Roxan Hockey Revonda Standard, MD - Assisting  PHYSICIAN ASSISTANT: NA  ASSISTANTS:   ANESTHESIA:   general  EBL:  2 mL   BLOOD ADMINISTERED:none  DRAINS: none   LOCAL MEDICATIONS USED:  NONE  SPECIMEN:  Lavage/Washing  DISPOSITION OF SPECIMEN:  microbiology  COUNTS:  YES  TOURNIQUET:  * No tourniquets in log *  DICTATION: .Dragon Dictation  PLAN OF CARE: return to 2 H  PATIENT DISPOSITION:  PACU - hemodynamically stable.   Delay start of Pharmacological VTE agent (>24hrs) due to surgical blood loss or risk of bleeding: yes

## 2018-10-26 NOTE — Progress Notes (Signed)
RCRITICAL VALUE ALERT  Critical Value:  Results for Melvin Brewer, Melvin Brewer (MRN [id number]) as of 10/26/2018 06:33  Ref. Range 10/26/2018 04:20  Potassium Latest Ref Range: 3.5 - 5.1 mmol/L 2.7 (LL)   Orders Received/Actions taken: Will replace 3 runs via PICC line.

## 2018-10-26 NOTE — Progress Notes (Signed)
     [address]       [place] [address]             [phone]       Evening Rounds  POD 22 s/p CABG, complicated by subq emphysema POD 1 s/p EBV  Feels better Vitals:   10/26/18 1400 10/26/18 1609  BP:    Pulse: 97   Resp: (!) 23   Temp:  97.9 F (36.6 C)  SpO2: 95%    Alert NAD eating dinner  Doing well

## 2018-10-27 ENCOUNTER — Inpatient Hospital Stay (HOSPITAL_COMMUNITY): Payer: Medicare Other

## 2018-10-27 LAB — GLUCOSE, CAPILLARY
Glucose-Capillary: 112 mg/dL — ABNORMAL HIGH (ref 70–99)
Glucose-Capillary: 99 mg/dL (ref 70–99)
Glucose-Capillary: 105 mg/dL — ABNORMAL HIGH (ref 70–99)
Glucose-Capillary: 95 mg/dL (ref 70–99)

## 2018-10-27 LAB — ACID FAST SMEAR (AFB, MYCOBACTERIA): Acid Fast Smear: NEGATIVE

## 2018-10-27 NOTE — Progress Notes (Signed)
Referring Physician(s): Dr Darcey Nora  Supervising Physician: Arne Cleveland  Patient Status:  [facility] - In-pt  Chief Complaint:  Left pigtail chest tube placed in IR 7/13  Subjective:   Prolonged air leak after redo sternotomy, redo coronary artery bypass grafting with significant subcutaneous emphysema despite chest tube drainage  To OR yesterday or endobronchial valve placement  Looks so much better today! Up in chair Feels and looks great He is so happy!  Allergies: Atorvastatin and Sulfa antibiotics  Medications: Prior to Admission medications   Medication Sig Start Date End Date Taking? Authorizing Provider  albuterol (PROVENTIL HFA;VENTOLIN HFA) 108 (90 BASE) MCG/ACT inhaler Inhale 2 puffs into the lungs every 6 (six) hours as needed for wheezing or shortness of breath.   Yes [provider]  albuterol (PROVENTIL) (2.5 MG/3ML) 0.083% nebulizer solution Inhale 2.5 mg into the lungs every 6 (six) hours as needed for wheezing or shortness of breath. 04/27/18  Yes [provider]  aspirin EC 81 MG tablet Take 81 mg by mouth every evening.    Yes [provider]  azelastine (ASTELIN) 0.1 % nasal spray Place 1 spray into both nostrils 2 (two) times daily. Use in each nostril as directed   Yes [provider]  azithromycin (ZITHROMAX) 250 MG tablet Take 250 mg by mouth every Monday, Wednesday, and Friday.   Yes [provider]  Cholecalciferol (VITAMIN D3) 50 MCG (2000 UT) TABS Take 2,000 Units by mouth every morning.    Yes [provider]  fexofenadine (ALLEGRA) 180 MG tablet Take 180 mg by mouth daily. 05/27/18  Yes [provider]  fluticasone (FLONASE) 50 MCG/ACT nasal spray Place 1 spray into both nostrils daily.   Yes [provider]  losartan (COZAAR) 25 MG tablet Take 25 mg by mouth daily. 07/08/18  Yes [provider]  metFORMIN (GLUCOPHAGE) 500 MG tablet Take 500 mg by mouth 2 (two) times a  day.  05/04/17  Yes [provider]  metoprolol (LOPRESSOR) 50 MG tablet Take 50 mg by mouth 2 (two) times daily.   Yes [provider]  montelukast (SINGULAIR) 10 MG tablet Take 10 mg by mouth every evening. 07/02/18  Yes [provider]  rosuvastatin (CRESTOR) 40 MG tablet Take 40 mg by mouth daily.   Yes [provider]  SYMBICORT 160-4.5 MCG/ACT inhaler Inhale 2 puffs into the lungs 2 (two) times a day. 05/07/18  Yes [provider]  Tiotropium Bromide Monohydrate (SPIRIVA RESPIMAT) 2.5 MCG/ACT AERS Inhale 2 puffs into the lungs daily.  03/06/17  Yes [provider]  valACYclovir (VALTREX) 500 MG tablet Take 500 mg by mouth every evening. 06/13/18  Yes [provider]  DENTA 5000 PLUS 1.1 % CREA dental cream Take 1 application by mouth at bedtime. Brush teeth for at least 1 min 05/09/17   [provider]  sildenafil (REVATIO) 20 MG tablet Take 20-100 mg by mouth daily as needed for erectile dysfunction. 12/07/17   [provider]     Vital Signs: BP 107/71   Pulse 79   Temp 97.9 F (36.6 C) (Oral)   Resp 16   Ht 5\' 9"  (1.753 m)   Wt 211 lb 6.7 oz (95.9 kg)   SpO2 92%   BMI 31.22 kg/m   Physical Exam Vitals signs reviewed.  Skin:    General: Skin is warm and dry.     Comments: IR chest tube is intact Centrally located chest tube Site is clean and  dry NT no bleeding OP clear yellow: 90 cc or so in Pleurvac No airleak Suction at 20 cm     Imaging: Dg Chest 1 View  Result Date: 10/25/2018 CLINICAL DATA:  Follow-up chest tube EXAM: CHEST  1 VIEW COMPARISON:  10/24/2018 FINDINGS: Cardiac shadow is stable. Postsurgical changes are again seen. Significant subcutaneous emphysema is again identified. Two chest tubes are again seen in the left base. No definitive pneumothorax is seen on this exam. No right-sided pneumothorax is noted. No bony abnormality is seen. IMPRESSION: Left chest tubes in place.  No  definitive pneumothorax is seen. Stable subcutaneous emphysema Electronically Signed   By: Inez Catalina M.D.   On: 10/25/2018 08:54   Dg Chest Port 1 View  Result Date: 10/27/2018 CLINICAL DATA:  Chest tube in place. EXAM: PORTABLE CHEST 1 VIEW COMPARISON:  Radiograph of October 26, 2018. FINDINGS: Stable cardiomediastinal silhouette. Sternotomy wires are noted. Right-sided PICC line is unchanged in position. Stable position of 2 left-sided chest tubes are noted. Continued presence of extensive subcutaneous emphysema is noted bilaterally. Underlying pneumothorax cannot be excluded due to this artifact. Probable left basilar subsegmental atelectasis is noted. Bony thorax unremarkable. IMPRESSION: Stable position of 2 left-sided chest tubes. Stable extensive subcutaneous emphysema is noted bilaterally. No definite pneumothorax is noted, but may be obscured due to overlying subcutaneous emphysema. Electronically Signed   By: Marijo Conception M.D.   On: 10/27/2018 08:56   Dg Chest Port 1 View  Result Date: 10/26/2018 CLINICAL DATA:  Status post bronchoscopy EXAM: PORTABLE CHEST 1 VIEW COMPARISON:  10/26/2018 FINDINGS: Cardiac shadow is stable. Two chest tubes are again identified in the left base and stable. Right-sided PICC line is again seen. Diffuse subcutaneous emphysema is again seen. No definitive pneumothorax is noted. IMPRESSION: No significant change from the prior exam. Electronically Signed   By: Inez Catalina M.D.   On: 10/26/2018 11:11   Dg Chest Port 1 View  Result Date: 10/26/2018 CLINICAL DATA:  Chest tube in place. EXAM: PORTABLE CHEST 1 VIEW COMPARISON:  Chest x-ray from yesterday. FINDINGS: Interval placement of a right upper extremity PICC line with tip in the distal SVC. Unchanged left-sided chest tubes. Normal heart size status post CABG. Normal pulmonary vascularity. Unchanged mild scarring/atelectasis at the left lung base. No focal consolidation, pleural effusion, or pneumothorax. No  acute osseous abnormality. Diffuse subcutaneous emphysema again noted. IMPRESSION: 1. Unchanged left-sided chest tubes.  No definite pneumothorax. 2. New right upper extremity PICC line appropriately positioned. Electronically Signed   By: Titus Dubin M.D.   On: 10/26/2018 09:14   Dg Chest Port 1 View  Result Date: 10/24/2018 CLINICAL DATA:  Follow-up pneumothorax EXAM: PORTABLE CHEST 1 VIEW COMPARISON:  10/23/2018 FINDINGS: Two basilar left-sided chest tubes are again identified and stable. No definitive pneumothorax is identified. Considerable subcutaneous edema is noted throughout the chest. No focal infiltrate is seen. Mild left basilar atelectasis is again noted. Postsurgical changes are seen. IMPRESSION: Two chest tubes in the left base. No definitive recurrent pneumothorax is seen although the lung markings are somewhat obscured by significant subcutaneous emphysema. Electronically Signed   By: Inez Catalina M.D.   On: 10/24/2018 07:21   [place] Ekg Site Rite  Result Date: 10/25/2018 If Site Rite image not attached, placement could not be confirmed due to current cardiac rhythm.   Labs:  CBC: Recent Labs    10/18/18 0226 10/20/18 0246 10/23/18 0308 10/26/18 0420  WBC 10.3 8.5 8.6 8.9  HGB 10.9* 10.9*  11.4* 11.4*  HCT 33.6* 33.9* 36.1* 35.3*  PLT 286 323 351 312    COAGS: Recent Labs    10/01/18 1003 10/04/18 1326 10/22/18 0940  INR 1.1 1.6* 1.1  APTT 24 32  --     BMP: Recent Labs    10/23/18 0308 10/24/18 0320 10/25/18 0213 10/26/18 0420  NA 135 136 136 135  K 4.4 4.0 3.6 2.7*  CL 94* 94* 90* 85*  CO2 31 33* 33* 36*  GLUCOSE 110* 99 105* 96  BUN 19 16 17 19   CALCIUM 9.0 8.9 9.3 9.1  CREATININE 1.10 0.95 1.04 1.23  GFRNONAA >60 >60 >60 59*  GFRAA >60 >60 >60 >60    LIVER FUNCTION TESTS: Recent Labs    10/01/18 1003 10/08/18 0303  BILITOT 0.6 0.6  AST 24 21  ALT 50* 22  ALKPHOS 43 33*  PROT 5.9* 5.3*  ALBUMIN 3.7 2.7*    Assessment and Plan:   PTX post GABG redo 7/6 Endobronchial valves placed in OR yesterday Looks and feels so much better today 'we will follow IR Chest tube Plan per Dr Darcey Nora  Electronically Signed: Lavonia Drafts, PA-C 10/27/2018, 9:53 AM   I spent a total of 15 Minutes at the the patient's bedside AND on the patient's hospital floor or unit, greater than 50% of which was counseling/coordinating care for left chest tube

## 2018-10-27 NOTE — Plan of Care (Signed)
  Problem: Activity: Goal: Risk for activity intolerance will decrease Outcome: Progressing   Problem: Cardiac: Goal: Will achieve and/or maintain hemodynamic stability Outcome: Progressing   Problem: Clinical Measurements: Goal: Postoperative complications will be avoided or minimized Outcome: Progressing   Problem: Respiratory: Goal: Respiratory status will improve Outcome: Progressing   Problem: Skin Integrity: Goal: Wound healing without signs and symptoms of infection Outcome: Progressing Goal: Risk for impaired skin integrity will decrease Outcome: Progressing   Problem: Urinary Elimination: Goal: Ability to achieve and maintain adequate renal perfusion and functioning will improve Outcome: Progressing   Problem: Education: Goal: Knowledge of General Education information will improve Description: Including pain rating scale, medication(s)/side effects and non-pharmacologic comfort measures Outcome: Progressing   Problem: Health Behavior/Discharge Planning: Goal: Ability to manage health-related needs will improve Outcome: Progressing   Problem: Clinical Measurements: Goal: Ability to maintain clinical measurements within normal limits will improve Outcome: Progressing Goal: Will remain free from infection Outcome: Progressing Goal: Diagnostic test results will improve Outcome: Progressing Goal: Respiratory complications will improve Outcome: Progressing Goal: Cardiovascular complication will be avoided Outcome: Progressing   Problem: Activity: Goal: Risk for activity intolerance will decrease Outcome: Progressing   Problem: Nutrition: Goal: Adequate nutrition will be maintained Outcome: Progressing   Problem: Coping: Goal: Level of anxiety will decrease Outcome: Progressing   Problem: Elimination: Goal: Will not experience complications related to bowel motility Outcome: Progressing Goal: Will not experience complications related to urinary  retention Outcome: Progressing   Problem: Pain Managment: Goal: General experience of comfort will improve Outcome: Progressing   Problem: Safety: Goal: Ability to remain free from injury will improve Outcome: Progressing   Problem: Skin Integrity: Goal: Risk for impaired skin integrity will decrease Outcome: Progressing   

## 2018-10-27 NOTE — Progress Notes (Signed)
     [address]       [place] [address]             [phone]       No events overnight.  Has been off supplement O2 since yesterday.  Denies any shortness of breath.  Vitals:   10/27/18 0700 10/27/18 0751  BP: 113/67   Pulse: 83   Resp:    Temp:  97.9 F (36.6 C)  SpO2: 93%    Alert NAD SubQ emphysema much improved Voice much improved Sinus Incision clean No leak on chest tubes  I/O last 3 completed shifts: In: 717.4 [P.O.:120; I.V.:500; IV Piggyback:97.4] Out: 1288 [Urine:1200; Blood:52; Chest Tube:36]  CBC Latest Ref Rng & Units 10/26/2018 10/23/2018 10/20/2018  WBC 4.0 - 10.5 K/uL 8.9 8.6 8.5  Hemoglobin 13.0 - 17.0 g/dL 11.4(L) 11.4(L) 10.9(L)  Hematocrit 39.0 - 52.0 % 35.3(L) 36.1(L) 33.9(L)  Platelets 150 - 400 K/uL 312 351 323   BMP Latest Ref Rng & Units 10/26/2018 10/25/2018 10/24/2018  Glucose 70 - 99 mg/dL 96 105(H) 99  BUN 8 - 23 mg/dL 19 17 16   Creatinine 0.61 - 1.24 mg/dL 1.23 1.04 0.95  Sodium 135 - 145 mmol/L 135 136 136  Potassium 3.5 - 5.1 mmol/L 2.7(LL) 3.6 4.0  Chloride 98 - 111 mmol/L 85(L) 90(L) 94(L)  CO2 22 - 32 mmol/L 36(H) 33(H) 33(H)  Calcium 8.9 - 10.3 mg/dL 9.1 9.3 8.9     POD 23 s/p CABG, complicated by subq emphysema POD 1 s/p EBV  Doing well, will decrease CT suction to -6mmHg Continue pulm toilet

## 2018-10-28 ENCOUNTER — Inpatient Hospital Stay (HOSPITAL_COMMUNITY): Payer: Medicare Other

## 2018-10-28 LAB — CULTURE, RESPIRATORY W GRAM STAIN: Culture: NORMAL

## 2018-10-28 LAB — GLUCOSE, CAPILLARY
Glucose-Capillary: 151 mg/dL — ABNORMAL HIGH (ref 70–99)
Glucose-Capillary: 117 mg/dL — ABNORMAL HIGH (ref 70–99)
Glucose-Capillary: 92 mg/dL (ref 70–99)
Glucose-Capillary: 80 mg/dL (ref 70–99)

## 2018-10-28 NOTE — Progress Notes (Signed)
     [address]       [place] [address]             [phone]       No events overnight. Remains off oxygen  Vitals:   10/28/18 0600 10/28/18 0700  BP: 119/62   Pulse: 90 93  Resp: 19 16  Temp:    SpO2: 93% 90%   Alert NAD SubQ emphysema much improved Voice much improved Sinus Incision clean No leak on chest tubes  I/O last 3 completed shifts: In: 1263 [P.O.:1260; I.V.:3] Out: 1365 [Urine:1325; Chest Tube:40]  CBC Latest Ref Rng & Units 10/26/2018 10/23/2018 10/20/2018  WBC 4.0 - 10.5 K/uL 8.9 8.6 8.5  Hemoglobin 13.0 - 17.0 g/dL 11.4(L) 11.4(L) 10.9(L)  Hematocrit 39.0 - 52.0 % 35.3(L) 36.1(L) 33.9(L)  Platelets 150 - 400 K/uL 312 351 323   BMP Latest Ref Rng & Units 10/26/2018 10/25/2018 10/24/2018  Glucose 70 - 99 mg/dL 96 105(H) 99  BUN 8 - 23 mg/dL 19 17 16   Creatinine 0.61 - 1.24 mg/dL 1.23 1.04 0.95  Sodium 135 - 145 mmol/L 135 136 136  Potassium 3.5 - 5.1 mmol/L 2.7(LL) 3.6 4.0  Chloride 98 - 111 mmol/L 85(L) 90(L) 94(L)  CO2 22 - 32 mmol/L 36(H) 33(H) 33(H)  Calcium 8.9 - 10.3 mg/dL 9.1 9.3 8.9     POD 24 s/p CABG, complicated by subq emphysema POD 2 s/p EBV  Doing well, will decrease CT suction to waterseal today.   Continue pulm toilet

## 2018-10-28 NOTE — Plan of Care (Signed)
  Problem: Activity: Goal: Risk for activity intolerance will decrease Outcome: Progressing   Problem: Cardiac: Goal: Will achieve and/or maintain hemodynamic stability Outcome: Progressing   Problem: Clinical Measurements: Goal: Postoperative complications will be avoided or minimized Outcome: Progressing   Problem: Respiratory: Goal: Respiratory status will improve Outcome: Progressing   Problem: Skin Integrity: Goal: Wound healing without signs and symptoms of infection Outcome: Progressing Goal: Risk for impaired skin integrity will decrease Outcome: Progressing   Problem: Urinary Elimination: Goal: Ability to achieve and maintain adequate renal perfusion and functioning will improve Outcome: Progressing   Problem: Education: Goal: Knowledge of General Education information will improve Description: Including pain rating scale, medication(s)/side effects and non-pharmacologic comfort measures Outcome: Progressing   Problem: Health Behavior/Discharge Planning: Goal: Ability to manage health-related needs will improve Outcome: Progressing   Problem: Clinical Measurements: Goal: Ability to maintain clinical measurements within normal limits will improve Outcome: Progressing Goal: Will remain free from infection Outcome: Progressing Goal: Diagnostic test results will improve Outcome: Progressing Goal: Respiratory complications will improve Outcome: Progressing Goal: Cardiovascular complication will be avoided Outcome: Progressing   Problem: Activity: Goal: Risk for activity intolerance will decrease Outcome: Progressing   Problem: Nutrition: Goal: Adequate nutrition will be maintained Outcome: Progressing   Problem: Coping: Goal: Level of anxiety will decrease Outcome: Progressing   Problem: Elimination: Goal: Will not experience complications related to bowel motility Outcome: Progressing Goal: Will not experience complications related to urinary  retention Outcome: Progressing   Problem: Pain Managment: Goal: General experience of comfort will improve Outcome: Progressing   Problem: Safety: Goal: Ability to remain free from injury will improve Outcome: Progressing   Problem: Skin Integrity: Goal: Risk for impaired skin integrity will decrease Outcome: Progressing   

## 2018-10-29 ENCOUNTER — Encounter (HOSPITAL_COMMUNITY): Payer: Self-pay | Admitting: Cardiothoracic Surgery

## 2018-10-29 ENCOUNTER — Inpatient Hospital Stay (HOSPITAL_COMMUNITY): Payer: Medicare Other

## 2018-10-29 LAB — CBC
HCT: 32.8 % — ABNORMAL LOW (ref 39.0–52.0)
Hemoglobin: 10.7 g/dL — ABNORMAL LOW (ref 13.0–17.0)
MCH: 31.6 pg (ref 26.0–34.0)
MCHC: 32.6 g/dL (ref 30.0–36.0)
MCV: 96.8 fL (ref 80.0–100.0)
Platelets: 190 10*3/uL (ref 150–400)
RBC: 3.39 MIL/uL — ABNORMAL LOW (ref 4.22–5.81)
RDW: 13.7 % (ref 11.5–15.5)
WBC: 6.4 10*3/uL (ref 4.0–10.5)
nRBC: 0 % (ref 0.0–0.2)

## 2018-10-29 LAB — BASIC METABOLIC PANEL
Anion gap: 9 (ref 5–15)
BUN: 17 mg/dL (ref 8–23)
CO2: 35 mmol/L — ABNORMAL HIGH (ref 22–32)
Calcium: 9.1 mg/dL (ref 8.9–10.3)
Chloride: 92 mmol/L — ABNORMAL LOW (ref 98–111)
Creatinine, Ser: 1.01 mg/dL (ref 0.61–1.24)
GFR calc Af Amer: >60 mL/min (ref >60)
GFR calc non Af Amer: >60 mL/min (ref >60)
Glucose, Bld: 88 mg/dL (ref 70–99)
Potassium: 3.3 mmol/L — ABNORMAL LOW (ref 3.5–5.1)
Sodium: 136 mmol/L (ref 135–145)

## 2018-10-29 LAB — GLUCOSE, CAPILLARY
Glucose-Capillary: 108 mg/dL — ABNORMAL HIGH (ref 70–99)
Glucose-Capillary: 111 mg/dL — ABNORMAL HIGH (ref 70–99)
Glucose-Capillary: 152 mg/dL — ABNORMAL HIGH (ref 70–99)
Glucose-Capillary: 118 mg/dL — ABNORMAL HIGH (ref 70–99)

## 2018-10-29 MED ORDER — POTASSIUM CHLORIDE CRYS ER 20 MEQ PO TBCR
40.0000 meq | EXTENDED_RELEASE_TABLET | Freq: Two times a day (BID) | ORAL | Status: DC
  Administered 2018-10-29 – 2018-10-30 (×3): 40 meq via ORAL
  Filled 2018-10-29 (×4): qty 2

## 2018-10-29 MED ORDER — FUROSEMIDE 40 MG PO TABS
40.0000 mg | ORAL_TABLET | Freq: Every day | ORAL | Status: DC
  Administered 2018-10-30 – 2018-10-31 (×2): 40 mg via ORAL
  Filled 2018-10-29 (×2): qty 1

## 2018-10-29 MED ORDER — POTASSIUM CHLORIDE CRYS ER 20 MEQ PO TBCR
20.0000 meq | EXTENDED_RELEASE_TABLET | ORAL | Status: AC
  Administered 2018-10-29 (×3): 20 meq via ORAL
  Filled 2018-10-29 (×3): qty 1

## 2018-10-29 NOTE — Progress Notes (Signed)
TCTS Evening Rounds Doing well now 3 d after EBV Left CT removed; pigtail remains No complaints VSS Continue present management

## 2018-10-29 NOTE — Progress Notes (Signed)
Nutrition Follow-up  RD working remotely.  DOCUMENTATION CODES:   Obesity unspecified  INTERVENTION:   - Ensure Enlive po TID, each supplement provides 350 kcal and 20 grams of protein  - Continue MVI with minerals daily  - Continue to encourage adequate PO intake  NUTRITION DIAGNOSIS:   Increased nutrient needs related to chronic illness (COPD) as evidenced by estimated needs.  Ongoing, being addressed via oral nutrition supplements  GOAL:   Patient will meet greater than or equal to 90% of their needs  Progressing  MONITOR:   PO intake, Supplement acceptance, Labs, I & O's, Weight trends, Skin  REASON FOR ASSESSMENT:   LOS    ASSESSMENT:   68 year old male who presented on 6/25 for redo CABG x 1. PMH of COPD, DM, GERD, HLD, HTN, CAD. Pt extubated after procedure.  6/29 - developed subcutaneous emphysema, s/p right chest tube placement 7/06 - chest tube exchange in OR 7/13 - s/p left chest tube placement in IR 7/17 - s/p video bronchoscopy with insertion of interbronchial valves  Noted plan to removed the 55F chest tube today but leave pigtail to water seal.  Weight down 7 lbs since last RD visit and down 13 lbs since admission weight. Per RN edema assessment, pt continues to have mild pitting facial edema, mild pitting edema to BUE, and moderate pitting edema to BLE.  Spoke with pt at bedside. Pt reports that he is feeling better and is happy to have gotten 1 chest tube removed today. Pt states that his swelling is much improved. Pt states he is not going to be here for much longer.  Pt reports still having difficulties with the food here: "I don't know if it's your food or my taste buds." Pt states that he is just eating what he can from his meal trays and "not worrying about it." Pt reports he ate toast for breakfast.  Pt endorses drinking at least 1 Ensure Enlive daily. RD encouraged pt to consume at least 2 daily. Pt expresses understanding.  Meal  Completion: 50-75% x last 8 recorded meals  Medications reviewed and include: Dulcolax, Colace, Ensure Enlive TID, SSI, Metformin, MVI with minerals, Protonix, K-dur 20 mEq x 3, K-dur 40 mEq daily, zinc sulfate 220 mg BID  Labs reviewed: potassium 3.3 CBG's: 80-108 x 24 hours  UOP: 575 ml x 24 hours CT: 30 ml x 24 hours I/O's: -3.5 L since admit  Diet Order:   Diet Order            Diet Heart Room service appropriate? Yes; Fluid consistency: Thin  Diet effective now              EDUCATION NEEDS:   Education needs have been addressed  Skin:  Skin Assessment: Skin Integrity Issues: Incisions: closed incisions to left leg and left chest  Last BM:  10/27/18  Height:   Ht Readings from Last 1 Encounters:  10/16/18 5\' 9"  (1.753 m)    Weight:   Wt Readings from Last 1 Encounters:  10/29/18 93.9 kg    Ideal Body Weight:  72.7 kg  BMI:  Body mass index is 30.57 kg/m.  Estimated Nutritional Needs:   Kcal:  1900-2100  Protein:  95-110 grams  Fluid:  >/= 1.8 L    Gaynell Face, MS, RD, LDN Inpatient Clinical Dietitian Pager: [phone] Weekend/After Hours: [phone]

## 2018-10-29 NOTE — Progress Notes (Signed)
Referring Physician(s): * No referring provider recorded for this case *  Supervising Physician: Aletta Edouard  Patient Status:  [facility] - In-pt  Chief Complaint: Subcutaneous emphysema  Subjective: "I feel so much better." Breathing comfortably.  Allergies: Atorvastatin and Sulfa antibiotics  Medications: Prior to Admission medications   Medication Sig Start Date End Date Taking? Authorizing Provider  albuterol (PROVENTIL HFA;VENTOLIN HFA) 108 (90 BASE) MCG/ACT inhaler Inhale 2 puffs into the lungs every 6 (six) hours as needed for wheezing or shortness of breath.   Yes [provider]  albuterol (PROVENTIL) (2.5 MG/3ML) 0.083% nebulizer solution Inhale 2.5 mg into the lungs every 6 (six) hours as needed for wheezing or shortness of breath. 04/27/18  Yes [provider]  aspirin EC 81 MG tablet Take 81 mg by mouth every evening.    Yes [provider]  azelastine (ASTELIN) 0.1 % nasal spray Place 1 spray into both nostrils 2 (two) times daily. Use in each nostril as directed   Yes [provider]  azithromycin (ZITHROMAX) 250 MG tablet Take 250 mg by mouth every Monday, Wednesday, and Friday.   Yes [provider]  Cholecalciferol (VITAMIN D3) 50 MCG (2000 UT) TABS Take 2,000 Units by mouth every morning.    Yes [provider]  fexofenadine (ALLEGRA) 180 MG tablet Take 180 mg by mouth daily. 05/27/18  Yes [provider]  fluticasone (FLONASE) 50 MCG/ACT nasal spray Place 1 spray into both nostrils daily.   Yes [provider]  losartan (COZAAR) 25 MG tablet Take 25 mg by mouth daily. 07/08/18  Yes [provider]  metFORMIN (GLUCOPHAGE) 500 MG tablet Take 500 mg by mouth 2 (two) times a day.  05/04/17  Yes [provider]  metoprolol (LOPRESSOR) 50 MG tablet Take 50 mg by mouth 2 (two) times daily.   Yes [provider]  montelukast (SINGULAIR) 10 MG tablet Take 10 mg by mouth every  evening. 07/02/18  Yes [provider]  rosuvastatin (CRESTOR) 40 MG tablet Take 40 mg by mouth daily.   Yes [provider]  SYMBICORT 160-4.5 MCG/ACT inhaler Inhale 2 puffs into the lungs 2 (two) times a day. 05/07/18  Yes [provider]  Tiotropium Bromide Monohydrate (SPIRIVA RESPIMAT) 2.5 MCG/ACT AERS Inhale 2 puffs into the lungs daily.  03/06/17  Yes [provider]  valACYclovir (VALTREX) 500 MG tablet Take 500 mg by mouth every evening. 06/13/18  Yes [provider]  DENTA 5000 PLUS 1.1 % CREA dental cream Take 1 application by mouth at bedtime. Brush teeth for at least 1 min 05/09/17   [provider]  sildenafil (REVATIO) 20 MG tablet Take 20-100 mg by mouth daily as needed for erectile dysfunction. 12/07/17   [provider]     Vital Signs: BP 129/77 (BP Location: Left Arm)    Pulse 98    Temp 97.6 F (36.4 C) (Oral)    Resp 13    Ht 5\' 9"  (1.753 m)    Wt 207 lb 0.2 oz (93.9 kg)    SpO2 93%    BMI 30.57 kg/m   Physical Exam Vitals signs and nursing note reviewed.   NAD, resting comfortably Chest:  L chest tube in place. Dressing in place. Serous fluid in PleurVac. To seal.  No air leak.   Imaging: Dg Chest Port 1 View  Result Date: 10/29/2018 CLINICAL DATA:  Chest tube.  Pneumothorax. EXAM: PORTABLE CHEST 1 VIEW COMPARISON:  10/28/2018. FINDINGS: Right  PICC line noted in stable position. Left chest tubes in stable position. Prior CABG. Stable heart size. No focal alveolar infiltrate. No pleural effusion. No definite pneumothorax identified. Extensive bilateral chest wall subcutaneous emphysema is again noted without interim change. Prior cervical spine fusion. IMPRESSION: 1. Right PICC line stable position. Two left chest tubes in stable position. No pneumothorax. Extensive bilateral chest wall subcutaneous emphysema is again noted. I 2.  Prior CABG.  Stable cardiomegaly. Electronically Signed   By: Marcello Moores  Register   On:  10/29/2018 07:54   Dg Chest Port 1 View  Result Date: 10/28/2018 CLINICAL DATA:  Chest tube in place. EXAM: PORTABLE CHEST 1 VIEW COMPARISON:  Radiograph of October 27, 2018. FINDINGS: Stable cardiomediastinal silhouette. Sternotomy wires are noted. Stable position of 2 left-sided chest tubes is noted. No pneumothorax is seen, although may be obscured due to extensive bilateral subcutaneous emphysema. Right-sided PICC line is unchanged in position. Bony thorax unremarkable. IMPRESSION: Stable position of 2 left-sided chest tubes. No definite pneumothorax is seen, although may be obscured due to extensive overlying subcutaneous emphysema. Electronically Signed   By: Marijo Conception M.D.   On: 10/28/2018 08:41   Dg Chest Port 1 View  Result Date: 10/27/2018 CLINICAL DATA:  Chest tube in place. EXAM: PORTABLE CHEST 1 VIEW COMPARISON:  Radiograph of October 26, 2018. FINDINGS: Stable cardiomediastinal silhouette. Sternotomy wires are noted. Right-sided PICC line is unchanged in position. Stable position of 2 left-sided chest tubes are noted. Continued presence of extensive subcutaneous emphysema is noted bilaterally. Underlying pneumothorax cannot be excluded due to this artifact. Probable left basilar subsegmental atelectasis is noted. Bony thorax unremarkable. IMPRESSION: Stable position of 2 left-sided chest tubes. Stable extensive subcutaneous emphysema is noted bilaterally. No definite pneumothorax is noted, but may be obscured due to overlying subcutaneous emphysema. Electronically Signed   By: Marijo Conception M.D.   On: 10/27/2018 08:56   Dg Chest Port 1 View  Result Date: 10/26/2018 CLINICAL DATA:  Status post bronchoscopy EXAM: PORTABLE CHEST 1 VIEW COMPARISON:  10/26/2018 FINDINGS: Cardiac shadow is stable. Two chest tubes are again identified in the left base and stable. Right-sided PICC line is again seen. Diffuse subcutaneous emphysema is again seen. No definitive pneumothorax is noted. IMPRESSION: No  significant change from the prior exam. Electronically Signed   By: Inez Catalina M.D.   On: 10/26/2018 11:11   Dg Chest Port 1 View  Result Date: 10/26/2018 CLINICAL DATA:  Chest tube in place. EXAM: PORTABLE CHEST 1 VIEW COMPARISON:  Chest x-ray from yesterday. FINDINGS: Interval placement of a right upper extremity PICC line with tip in the distal SVC. Unchanged left-sided chest tubes. Normal heart size status post CABG. Normal pulmonary vascularity. Unchanged mild scarring/atelectasis at the left lung base. No focal consolidation, pleural effusion, or pneumothorax. No acute osseous abnormality. Diffuse subcutaneous emphysema again noted. IMPRESSION: 1. Unchanged left-sided chest tubes.  No definite pneumothorax. 2. New right upper extremity PICC line appropriately positioned. Electronically Signed   By: Titus Dubin M.D.   On: 10/26/2018 09:14    Labs:  CBC: Recent Labs    10/20/18 0246 10/23/18 0308 10/26/18 0420 10/29/18 0239  WBC 8.5 8.6 8.9 6.4  HGB 10.9* 11.4* 11.4* 10.7*  HCT 33.9* 36.1* 35.3* 32.8*  PLT 323 351 312 190    COAGS: Recent Labs    10/01/18 1003 10/04/18 1326 10/22/18 0940  INR 1.1 1.6* 1.1  APTT 24 32  --     BMP: Recent Labs  10/24/18 0320 10/25/18 0213 10/26/18 0420 10/29/18 0239  NA 136 136 135 136  K 4.0 3.6 2.7* 3.3*  CL 94* 90* 85* 92*  CO2 33* 33* 36* 35*  GLUCOSE 99 105* 96 88  BUN 16 17 19 17   CALCIUM 8.9 9.3 9.1 9.1  CREATININE 0.95 1.04 1.23 1.01  GFRNONAA >60 >60 59* >60  GFRAA >60 >60 >60 >60    LIVER FUNCTION TESTS: Recent Labs    10/01/18 1003 10/08/18 0303  BILITOT 0.6 0.6  AST 24 21  ALT 50* 22  ALKPHOS 43 33*  PROT 5.9* 5.3*  ALBUMIN 3.7 2.7*    Assessment and Plan: Subcutaneous emphysema s/p chest tube placement 7/13 by Dr. Vernard Gambles Patient s/p interbronchial valves in OR 7/17.  Feeling better.  IR chest tube in place on left; to seal.  No air leak.  Plan per TCTS. For possible removal soon.    Electronically Signed: Docia Barrier, PA 10/29/2018, 11:52 AM   I spent a total of 15 Minutes at the the patient's bedside AND on the patient's hospital floor or unit, greater than 50% of which was counseling/coordinating care for subcutaneous emphysema.

## 2018-10-29 NOTE — Progress Notes (Addendum)
TCTS DAILY ICU PROGRESS NOTE                   [address]            [place] [address]          [phone]   3 Days Post-Op Procedure(s) (LRB): VIDEO BRONCHOSCOPY WITH INSERTION OF INTERBRONCHIAL VALVES (IBV) (N/A)  Total Length of Stay:  LOS: 25 days   Subjective:  No new complaints.  He is feeling much better.  Sub q air is much improved since I saw him previously.   Objective: Vital signs in last 24 hours: Temp:  [97.8 F (36.6 C)-98.2 F (36.8 C)] 98.2 F (36.8 C) (07/20 0655) Pulse Rate:  [69-101] 86 (07/20 0600) Cardiac Rhythm: Normal sinus rhythm (07/19 2000) Resp:  [6-21] 12 (07/20 0600) BP: (90-137)/(59-94) 110/59 (07/20 0600) SpO2:  [92 %-100 %] 94 % (07/20 0600) Weight:  [93.9 kg] 93.9 kg (07/20 0600)  Filed Weights   10/24/18 0543 10/28/18 0500 10/29/18 0600  Weight: 95.9 kg 93.7 kg 93.9 kg    Weight change: 0.2 kg   Intake/Output from previous day: 07/19 0701 - 07/20 0700 In: 1560 [P.O.:1560] Out: 605 [Urine:575; Chest Tube:30]   Current Meds: Scheduled Meds: . aspirin EC  325 mg Oral Daily   Or  . aspirin  324 mg Per Tube Daily  . azithromycin  250 mg Oral Q M,W,F  . bisacodyl  10 mg Oral Daily   Or  . bisacodyl  10 mg Rectal Daily  . budesonide-formoterol  2 puff Inhalation BID  . Chlorhexidine Gluconate Cloth  6 each Topical Daily  . Chlorhexidine Gluconate Cloth  6 each Topical Daily  . chlorpheniramine-HYDROcodone  5 mL Oral Q12H  . docusate sodium  200 mg Oral Daily  . enoxaparin (LOVENOX) injection  40 mg Subcutaneous Q24H  . feeding supplement (ENSURE ENLIVE)  237 mL Oral TID BM  . fluticasone  1 spray Each Nare Daily  . insulin aspart  0-15 Units Subcutaneous TID WC  . loratadine  10 mg Oral Daily  . losartan  25 mg Oral Daily  . mouth rinse  15 mL Mouth Rinse BID  . metFORMIN  500 mg Oral BID WC  . metoprolol tartrate  25 mg Oral BID   Or  . metoprolol tartrate  25 mg Per Tube BID  . montelukast  10 mg  Oral QPM  . multivitamin with minerals  1 tablet Oral Daily  . pantoprazole  40 mg Oral Daily  . potassium chloride  20 mEq Oral Q4H  . potassium chloride  40 mEq Oral Daily  . rosuvastatin  40 mg Oral Daily  . sodium chloride flush  10-40 mL Intracatheter Q12H  . sodium chloride flush  3 mL Intravenous Q12H  . sodium chloride flush  5 mL Intracatheter Q8H  . umeclidinium bromide  1 puff Inhalation Daily  . valACYclovir  500 mg Oral QPM  . zinc sulfate  220 mg Oral BID   Continuous Infusions: . sodium chloride Stopped (10/26/18 0527)  . lactated ringers Stopped (10/26/18 0919)   PRN Meds:.sodium chloride, albuterol, ALPRAZolam, magic mouthwash w/lidocaine, metoprolol tartrate, ondansetron (ZOFRAN) IV, oxyCODONE, polyvinyl alcohol, sodium chloride flush, sodium chloride flush, sorbitol, traMADol, white petrolatum  General appearance: alert, cooperative and no distress Heart: regular rate and rhythm Lungs: clear to auscultation bilaterally Abdomen: soft, non-tender; bowel sounds normal; no masses,  no organomegaly Extremities: edema trace Wound: clean and dry  Lab Results: CBC: Recent Labs    10/29/18 0239  WBC 6.4  HGB 10.7*  HCT 32.8*  PLT 190   BMET:  Recent Labs    10/29/18 0239  NA 136  K 3.3*  CL 92*  CO2 35*  GLUCOSE 88  BUN 17  CREATININE 1.01  CALCIUM 9.1    CMET: Lab Results  Component Value Date   WBC 6.4 10/29/2018   HGB 10.7 (L) 10/29/2018   HCT 32.8 (L) 10/29/2018   PLT 190 10/29/2018   GLUCOSE 88 10/29/2018   ALT 22 10/08/2018   AST 21 10/08/2018   NA 136 10/29/2018   K 3.3 (L) 10/29/2018   CL 92 (L) 10/29/2018   CREATININE 1.01 10/29/2018   BUN 17 10/29/2018   CO2 35 (H) 10/29/2018   INR 1.1 10/22/2018   HGBA1C 6.2 (H) 10/01/2018      PT/INR: No results for input(s): LABPROT, INR in the last 72 hours. Radiology: Dg Chest Port 1 View  Result Date: 10/28/2018 CLINICAL DATA:  Chest tube in place. EXAM: PORTABLE CHEST 1 VIEW  COMPARISON:  Radiograph of October 27, 2018. FINDINGS: Stable cardiomediastinal silhouette. Sternotomy wires are noted. Stable position of 2 left-sided chest tubes is noted. No pneumothorax is seen, although may be obscured due to extensive bilateral subcutaneous emphysema. Right-sided PICC line is unchanged in position. Bony thorax unremarkable. IMPRESSION: Stable position of 2 left-sided chest tubes. No definite pneumothorax is seen, although may be obscured due to extensive overlying subcutaneous emphysema. Electronically Signed   By: Marijo Conception M.D.   On: 10/28/2018 08:41     Assessment/Plan: S/P Procedure(s) (LRB): VIDEO BRONCHOSCOPY WITH INSERTION OF INTERBRONCHIAL VALVES (IBV) (N/A)  1. CV- hemodynamically stable in NSR-continue Cozaar Lopressor 2. Pulm- sub q air much improved, bilateral chest tubes in place w/o evidence of air leak currently on water seal, will leave in place today, no pneumothorax on CXR 3. Renal- creatinine remains stable, weight is below baseline, no lasix needed 4. ID- afebrile, no evidence of wound infection, has been on zithromax since 6/26, can likely d/c 5. DM- sugars controlled 6. dispo- patient stable,L sided chest tubes in place on water seal, no air leak present,  today, remains hemodynamically stable, transfer to [facility], repeat CXR in AM, can hopefully d/c chest tube soon  Ellwood Handler 10/29/2018 7:36 AM   Continues to improve after LUL EBV Remove the 3F tube, leave pigtail to water seal Patient on maintenance dose azithromycin M-W-F for chronic bronchitis patient examined and medical record reviewed,agree with above note. [name] 10/29/2018

## 2018-10-30 ENCOUNTER — Inpatient Hospital Stay (HOSPITAL_COMMUNITY): Payer: Medicare Other

## 2018-10-30 LAB — FUNGUS STAIN

## 2018-10-30 LAB — BASIC METABOLIC PANEL
Anion gap: 8 (ref 5–15)
BUN: 16 mg/dL (ref 8–23)
CO2: 32 mmol/L (ref 22–32)
Calcium: 9.4 mg/dL (ref 8.9–10.3)
Chloride: 96 mmol/L — ABNORMAL LOW (ref 98–111)
Creatinine, Ser: 1.11 mg/dL (ref 0.61–1.24)
GFR calc Af Amer: >60 mL/min (ref >60)
GFR calc non Af Amer: >60 mL/min (ref >60)
Glucose, Bld: 100 mg/dL — ABNORMAL HIGH (ref 70–99)
Potassium: 4.1 mmol/L (ref 3.5–5.1)
Sodium: 136 mmol/L (ref 135–145)

## 2018-10-30 LAB — GLUCOSE, CAPILLARY
Glucose-Capillary: 84 mg/dL (ref 70–99)
Glucose-Capillary: 91 mg/dL (ref 70–99)
Glucose-Capillary: 127 mg/dL — ABNORMAL HIGH (ref 70–99)
Glucose-Capillary: 89 mg/dL (ref 70–99)

## 2018-10-30 LAB — FUNGAL STAIN REFLEX

## 2018-10-30 MED ORDER — BISACODYL 10 MG RE SUPP: 10.0000 mg | Freq: Every day | RECTAL | Status: DC | PRN

## 2018-10-30 MED ORDER — DOCUSATE SODIUM 100 MG PO CAPS: 200.0000 mg | ORAL_CAPSULE | Freq: Two times a day (BID) | ORAL | Status: DC | PRN

## 2018-10-30 MED ORDER — BISACODYL 5 MG PO TBEC: 10.0000 mg | DELAYED_RELEASE_TABLET | Freq: Every day | ORAL | Status: DC | PRN

## 2018-10-30 NOTE — Progress Notes (Signed)
Patient ID: Melvin Brewer, male   DOB: 09-21-1950, 68 y.o.   MRN: [id number]   CXR this am:  IMPRESSION: 1. Left chest tube in stable position. No pneumothorax. Right PICC line stable position. Diffuse chest wall subcutaneous emphysema again noted. 2.  Prior CABG.  Heart size stable.  Left chest tube removal per Dr Lucianne Lei Trigt  Removed CT at bedside without complication Tolerated well Vaseline dressing applied  Stat CXR pending

## 2018-10-30 NOTE — Progress Notes (Addendum)
TCTS DAILY ICU PROGRESS NOTE                   [address]            [place] [address]          [phone]   4 Days Post-Op Procedure(s) (LRB): VIDEO BRONCHOSCOPY WITH INSERTION OF INTERBRONCHIAL VALVES (IBV) (N/A)  Total Length of Stay:  LOS: 26 days   Subjective:  No new complaints.  Continues to feel well.  He is anxious to get home.  Objective: Vital signs in last 24 hours: Temp:  [96.4 F (35.8 C)-98 F (36.7 C)] 97.6 F (36.4 C) (07/21 0355) Pulse Rate:  [68-105] 70 (07/21 0400) Cardiac Rhythm: Normal sinus rhythm (07/21 0000) Resp:  [13-14] 13 (07/20 1025) BP: (112-136)/(62-80) 136/78 (07/21 0400) SpO2:  [92 %-98 %] 97 % (07/21 0400) Weight:  [93.9 kg] 93.9 kg (07/21 0500)  Filed Weights   10/28/18 0500 10/29/18 0600 10/30/18 0500  Weight: 93.7 kg 93.9 kg 93.9 kg    Weight change: 0 kg   Intake/Output from previous day: 07/20 0701 - 07/21 0700 In: 480 [P.O.:480] Out: 310 [Urine:300; Chest Tube:10]  Current Meds: Scheduled Meds: . aspirin EC  325 mg Oral Daily   Or  . aspirin  324 mg Per Tube Daily  . azithromycin  250 mg Oral Q M,W,F  . budesonide-formoterol  2 puff Inhalation BID  . Chlorhexidine Gluconate Cloth  6 each Topical Daily  . Chlorhexidine Gluconate Cloth  6 each Topical Daily  . chlorpheniramine-HYDROcodone  5 mL Oral Q12H  . enoxaparin (LOVENOX) injection  40 mg Subcutaneous Q24H  . feeding supplement (ENSURE ENLIVE)  237 mL Oral TID BM  . fluticasone  1 spray Each Nare Daily  . furosemide  40 mg Oral Daily  . insulin aspart  0-15 Units Subcutaneous TID WC  . loratadine  10 mg Oral Daily  . losartan  25 mg Oral Daily  . mouth rinse  15 mL Mouth Rinse BID  . metFORMIN  500 mg Oral BID WC  . metoprolol tartrate  25 mg Oral BID   Or  . metoprolol tartrate  25 mg Per Tube BID  . montelukast  10 mg Oral QPM  . multivitamin with minerals  1 tablet Oral Daily  . pantoprazole  40 mg Oral Daily  . potassium chloride   40 mEq Oral BID  . rosuvastatin  40 mg Oral Daily  . sodium chloride flush  10-40 mL Intracatheter Q12H  . sodium chloride flush  3 mL Intravenous Q12H  . sodium chloride flush  5 mL Intracatheter Q8H  . umeclidinium bromide  1 puff Inhalation Daily  . valACYclovir  500 mg Oral QPM  . zinc sulfate  220 mg Oral BID   Continuous Infusions: . sodium chloride Stopped (10/26/18 0527)   PRN Meds:.sodium chloride, albuterol, ALPRAZolam, bisacodyl **OR** bisacodyl, docusate sodium, magic mouthwash w/lidocaine, metoprolol tartrate, ondansetron (ZOFRAN) IV, oxyCODONE, polyvinyl alcohol, sodium chloride flush, sodium chloride flush, traMADol, white petrolatum  General appearance: alert, cooperative and no distress Heart: regular rate and rhythm Lungs: clear to auscultation bilaterally Abdomen: soft, non-tender; bowel sounds normal; no masses,  no organomegaly Extremities: extremities normal, atraumatic, no cyanosis or edema Wound: clean and dry  Lab Results: CBC: Recent Labs    10/29/18 0239  WBC 6.4  HGB 10.7*  HCT 32.8*  PLT 190   BMET:  Recent Labs    10/29/18 0239  NA  136  K 3.3*  CL 92*  CO2 35*  GLUCOSE 88  BUN 17  CREATININE 1.01  CALCIUM 9.1    CMET: Lab Results  Component Value Date   WBC 6.4 10/29/2018   HGB 10.7 (L) 10/29/2018   HCT 32.8 (L) 10/29/2018   PLT 190 10/29/2018   GLUCOSE 88 10/29/2018   ALT 22 10/08/2018   AST 21 10/08/2018   NA 136 10/29/2018   K 3.3 (L) 10/29/2018   CL 92 (L) 10/29/2018   CREATININE 1.01 10/29/2018   BUN 17 10/29/2018   CO2 35 (H) 10/29/2018   INR 1.1 10/22/2018   HGBA1C 6.2 (H) 10/01/2018      PT/INR: No results for input(s): LABPROT, INR in the last 72 hours. Radiology: No results found.   Assessment/Plan: S/P Procedure(s) (LRB): VIDEO BRONCHOSCOPY WITH INSERTION OF INTERBRONCHIAL VALVES (IBV) (N/A)  1. CV- hemodynamically stable, maintaining NSR- on Cozaar, Lopressor 2. Pulm- no acute issues, CT is without  air leak, CXR is stable in appearance 3. Renal- creatinine has been stable, K was at 3.3 yesterday, will recheck BMET in AM 4. ID- remains afebrile, on Zithromax for chronic bronchitis 5. DM- sugars controlled, continue current regimen 6. Dispo- patient stable, CT without air leak, can hopefully d/c today, will repeat K level today, BMET in AM, awaiting bed on 2C      Erin Barrett 10/30/2018 7:36 AM  remove pigtail patient examined and medical record reviewed,agree with above note. [name] 10/30/2018

## 2018-10-30 NOTE — Plan of Care (Signed)
  Problem: Activity: Goal: Risk for activity intolerance will decrease Outcome: Progressing   Problem: Cardiac: Goal: Will achieve and/or maintain hemodynamic stability Outcome: Progressing   Problem: Clinical Measurements: Goal: Postoperative complications will be avoided or minimized Outcome: Progressing   Problem: Respiratory: Goal: Respiratory status will improve Outcome: Progressing   Problem: Skin Integrity: Goal: Wound healing without signs and symptoms of infection Outcome: Progressing Goal: Risk for impaired skin integrity will decrease Outcome: Progressing   Problem: Urinary Elimination: Goal: Ability to achieve and maintain adequate renal perfusion and functioning will improve Outcome: Progressing   Problem: Education: Goal: Knowledge of General Education information will improve Description: Including pain rating scale, medication(s)/side effects and non-pharmacologic comfort measures Outcome: Progressing   Problem: Health Behavior/Discharge Planning: Goal: Ability to manage health-related needs will improve Outcome: Progressing   Problem: Clinical Measurements: Goal: Ability to maintain clinical measurements within normal limits will improve Outcome: Progressing Goal: Will remain free from infection Outcome: Progressing Goal: Diagnostic test results will improve Outcome: Progressing Goal: Respiratory complications will improve Outcome: Progressing Goal: Cardiovascular complication will be avoided Outcome: Progressing   Problem: Activity: Goal: Risk for activity intolerance will decrease Outcome: Progressing   Problem: Nutrition: Goal: Adequate nutrition will be maintained Outcome: Progressing   Problem: Coping: Goal: Level of anxiety will decrease Outcome: Progressing   Problem: Elimination: Goal: Will not experience complications related to bowel motility Outcome: Progressing Goal: Will not experience complications related to urinary  retention Outcome: Progressing   Problem: Pain Managment: Goal: General experience of comfort will improve Outcome: Progressing   Problem: Safety: Goal: Ability to remain free from injury will improve Outcome: Progressing   Problem: Skin Integrity: Goal: Risk for impaired skin integrity will decrease Outcome: Progressing   

## 2018-10-30 NOTE — Anesthesia Postprocedure Evaluation (Signed)
Anesthesia Post Note  Patient: Melvin Brewer  Procedure(s) Performed: VIDEO BRONCHOSCOPY WITH INSERTION OF INTERBRONCHIAL VALVES (IBV) (N/A Chest)     Patient location during evaluation: PACU Anesthesia Type: General Level of consciousness: awake and alert Pain management: pain level controlled Vital Signs Assessment: post-procedure vital signs reviewed and stable Respiratory status: spontaneous breathing, nonlabored ventilation, respiratory function stable and patient connected to nasal cannula oxygen Cardiovascular status: blood pressure returned to baseline and stable Postop Assessment: no apparent nausea or vomiting Anesthetic complications: no    Last Vitals:  Vitals:   10/30/18 0820 10/30/18 1100  BP: 124/84   Pulse: 92   Resp:    Temp:  36.8 C  SpO2: 97%     Last Pain:  Vitals:   10/30/18 1100  TempSrc: Oral  PainSc:                  Effie Berkshire

## 2018-10-30 NOTE — Progress Notes (Signed)
Report given at this time to Arbie Cookey, [place] on [facility]

## 2018-10-30 NOTE — Progress Notes (Signed)
Chart reviewed for LOS; B Noura Purpura RN,MHA,BSN Advanced Care Supervisor [phone] 

## 2018-10-31 ENCOUNTER — Inpatient Hospital Stay (HOSPITAL_COMMUNITY): Payer: Medicare Other

## 2018-10-31 LAB — BASIC METABOLIC PANEL
Anion gap: 5 (ref 5–15)
BUN: 17 mg/dL (ref 8–23)
CO2: 32 mmol/L (ref 22–32)
Calcium: 8.9 mg/dL (ref 8.9–10.3)
Chloride: 101 mmol/L (ref 98–111)
Creatinine, Ser: 0.96 mg/dL (ref 0.61–1.24)
GFR calc Af Amer: >60 mL/min (ref >60)
GFR calc non Af Amer: >60 mL/min (ref >60)
Glucose, Bld: 90 mg/dL (ref 70–99)
Potassium: 4.1 mmol/L (ref 3.5–5.1)
Sodium: 138 mmol/L (ref 135–145)

## 2018-10-31 LAB — GLUCOSE, CAPILLARY
Glucose-Capillary: 73 mg/dL (ref 70–99)
Glucose-Capillary: 96 mg/dL (ref 70–99)
Glucose-Capillary: 91 mg/dL (ref 70–99)
Glucose-Capillary: 93 mg/dL (ref 70–99)

## 2018-10-31 MED ORDER — POTASSIUM CHLORIDE ER 8 MEQ PO TBCR
16.0000 meq | EXTENDED_RELEASE_TABLET | Freq: Every day | ORAL | Status: DC
  Administered 2018-10-31: 16 meq via ORAL
  Filled 2018-10-31 (×3): qty 2

## 2018-10-31 NOTE — Progress Notes (Addendum)
      [address]       [place] [address]             [phone]      5 Days Post-Op Procedure(s) (LRB): VIDEO BRONCHOSCOPY WITH INSERTION OF INTERBRONCHIAL VALVES (IBV) (N/A)   Subjective:  Patient continues to feel well.  Happy to be going home tomorrow.  He is having issues with taking potassium supplement.  States is taste terrible and has been difficulty to swallow.  Objective: Vital signs in last 24 hours: Temp:  [97.7 F (36.5 C)-98.2 F (36.8 C)] 97.9 F (36.6 C) (07/22 0331) Pulse Rate:  [68-92] 68 (07/22 0331) Cardiac Rhythm: Normal sinus rhythm (07/22 0716) Resp:  [17-20] 17 (07/22 0331) BP: (104-150)/(67-92) 127/72 (07/22 0331) SpO2:  [94 %-98 %] 98 % (07/22 0331) Weight:  [93.2 kg] 93.2 kg (07/22 0331)  Intake/Output from previous day: 07/21 0701 - 07/22 0700 In: 253 [P.O.:240; I.V.:13] Out: 100 [Urine:100]  General appearance: alert, cooperative and no distress Heart: regular rate and rhythm Lungs: clear to auscultation bilaterally Abdomen: soft, non-tender; bowel sounds normal; no masses,  no organomegaly Extremities: extremities normal, atraumatic, no cyanosis or edema Wound: clean and dry  Lab Results: Recent Labs    10/29/18 0239  WBC 6.4  HGB 10.7*  HCT 32.8*  PLT 190   BMET:  Recent Labs    10/30/18 0818 10/31/18 0430  NA 136 138  K 4.1 4.1  CL 96* 101  CO2 32 32  GLUCOSE 100* 90  BUN 16 17  CREATININE 1.11 0.96  CALCIUM 9.4 8.9    PT/INR: No results for input(s): LABPROT, INR in the last 72 hours. ABG    Component Value Date/Time   PHART 7.376 10/05/2018 1043   HCO3 24.2 10/05/2018 1043   TCO2 24 10/04/2018 1949   ACIDBASEDEF 0.3 10/05/2018 1043   O2SAT 97.9 10/05/2018 1043   CBG (last 3)  Recent Labs    10/30/18 1623 10/30/18 2107 10/31/18 0614  GLUCAP 127* 89 73    Assessment/Plan: S/P Procedure(s) (LRB): VIDEO BRONCHOSCOPY WITH INSERTION OF INTERBRONCHIAL VALVES (IBV) (N/A)  1. CV-  remains hemodynamically stable on Lopressor, Cozaar 2. Pulm- CT removed yesterday, stable appearance of apical pneumothoraces, sub q air stable 3. Hypokalemia- resolved, up to 4.1, will stop Kdur and try Micro K capsules to see if patient can tolerate better 4. Renal- creatinine WNL, + pitting LE edema, will place TED hose continue Lasix 5. DM- sugars controlled 6. Dispo- patent stable, will continue diuretics, adjusted potassium regimen, will place H/H orders, if remains stable will d/c in AM   LOS: 27 days    Erin Barrett 10/31/2018  DC PICC tomorrow Cont lasix  Needs CR -I input today patient examined and medical record reviewed,agree with above note. [name] 10/31/2018

## 2018-10-31 NOTE — Plan of Care (Signed)
  Problem: Activity: Goal: Risk for activity intolerance will decrease Outcome: Progressing   Problem: Cardiac: Goal: Will achieve and/or maintain hemodynamic stability Outcome: Progressing   Problem: Clinical Measurements: Goal: Postoperative complications will be avoided or minimized Outcome: Progressing   Problem: Respiratory: Goal: Respiratory status will improve Outcome: Progressing   Problem: Skin Integrity: Goal: Wound healing without signs and symptoms of infection Outcome: Progressing   Problem: Health Behavior/Discharge Planning: Goal: Ability to manage health-related needs will improve Outcome: Progressing

## 2018-10-31 NOTE — Progress Notes (Signed)
CARDIAC REHAB PHASE I   PRE:  Rate/Rhythm: 92 SR  BP:  Sitting: 124/85      SaO2: 97 RA  MODE:  Ambulation: 320 ft   POST:  Rate/Rhythm: 108 ST  BP:  Sitting: 138/96    SaO2: 94 RA   Pt ambulated 361ft in hallway standby assist with front wheel walker. Pt with slow, steady gait. Post-walk, pt c/o some SOB. Encouraged purse lipped breathing. Pt returned to recliner. Encouraged continued ambulation today. Plans to complete d/c education tomorrow with wife on the phone. Will continue to follow.  [phone] Rufina Falco, RN BSN 10/31/2018 10:44 AM

## 2018-10-31 NOTE — Plan of Care (Signed)
  Problem: Activity: Goal: Risk for activity intolerance will decrease Outcome: Progressing   Problem: Cardiac: Goal: Will achieve and/or maintain hemodynamic stability Outcome: Progressing   Problem: Clinical Measurements: Goal: Postoperative complications will be avoided or minimized Outcome: Progressing   Problem: Respiratory: Goal: Respiratory status will improve Outcome: Progressing   Problem: Skin Integrity: Goal: Wound healing without signs and symptoms of infection Outcome: Progressing Goal: Risk for impaired skin integrity will decrease Outcome: Progressing   Problem: Urinary Elimination: Goal: Ability to achieve and maintain adequate renal perfusion and functioning will improve Outcome: Progressing   Problem: Education: Goal: Knowledge of General Education information will improve Description: Including pain rating scale, medication(s)/side effects and non-pharmacologic comfort measures Outcome: Progressing   Problem: Health Behavior/Discharge Planning: Goal: Ability to manage health-related needs will improve Outcome: Progressing   Problem: Clinical Measurements: Goal: Ability to maintain clinical measurements within normal limits will improve Outcome: Progressing Goal: Will remain free from infection Outcome: Progressing Goal: Diagnostic test results will improve Outcome: Progressing Goal: Respiratory complications will improve Outcome: Progressing Goal: Cardiovascular complication will be avoided Outcome: Progressing   Problem: Activity: Goal: Risk for activity intolerance will decrease Outcome: Progressing   Problem: Nutrition: Goal: Adequate nutrition will be maintained Outcome: Progressing   Problem: Coping: Goal: Level of anxiety will decrease Outcome: Progressing   Problem: Elimination: Goal: Will not experience complications related to bowel motility Outcome: Progressing Goal: Will not experience complications related to urinary  retention Outcome: Progressing   Problem: Pain Managment: Goal: General experience of comfort will improve Outcome: Progressing   Problem: Safety: Goal: Ability to remain free from injury will improve Outcome: Progressing   Problem: Skin Integrity: Goal: Risk for impaired skin integrity will decrease Outcome: Progressing   

## 2018-10-31 NOTE — Progress Notes (Signed)
Patient c/o SOB while ambulation.   Patient Saturations on Room Air at Rest = 97%  Patient Saturations on Room Air while Ambulating = 91%  Patient had trouble to breath with mask on, frequent stop ambulation without SpO2 below 90%. HS [employer]

## 2018-11-01 LAB — BASIC METABOLIC PANEL
Anion gap: 9 (ref 5–15)
BUN: 15 mg/dL (ref 8–23)
CO2: 31 mmol/L (ref 22–32)
Calcium: 8.9 mg/dL (ref 8.9–10.3)
Chloride: 99 mmol/L (ref 98–111)
Creatinine, Ser: 0.94 mg/dL (ref 0.61–1.24)
GFR calc Af Amer: >60 mL/min (ref >60)
GFR calc non Af Amer: >60 mL/min (ref >60)
Glucose, Bld: 89 mg/dL (ref 70–99)
Potassium: 3.9 mmol/L (ref 3.5–5.1)
Sodium: 139 mmol/L (ref 135–145)

## 2018-11-01 LAB — GLUCOSE, CAPILLARY: Glucose-Capillary: 67 mg/dL — ABNORMAL LOW (ref 70–99)

## 2018-11-01 MED ORDER — POTASSIUM CHLORIDE ER 20 MEQ PO TBCR: 20.0000 meq | EXTENDED_RELEASE_TABLET | Freq: Every day | ORAL | 3 refills | Status: DC

## 2018-11-01 MED ORDER — HYDROCOD POLST-CPM POLST ER 10-8 MG/5ML PO SUER: 5.0000 mL | Freq: Two times a day (BID) | ORAL | 0 refills | Status: DC | PRN

## 2018-11-01 MED ORDER — METOPROLOL TARTRATE 25 MG PO TABS: 25.0000 mg | ORAL_TABLET | Freq: Two times a day (BID) | ORAL | 3 refills | Status: AC

## 2018-11-01 MED ORDER — FUROSEMIDE 40 MG PO TABS: 40.0000 mg | ORAL_TABLET | Freq: Every day | ORAL | 3 refills | Status: DC

## 2018-11-01 NOTE — Progress Notes (Addendum)
      [address]       [place] [address]             [phone]      6 Days Post-Op Procedure(s) (LRB): VIDEO BRONCHOSCOPY WITH INSERTION OF INTERBRONCHIAL VALVES (IBV) (N/A)   Subjective:  No new complaints.  Continues to have some shortness of breath with ambulation.  Happy to be going home.  Objective: Vital signs in last 24 hours: Temp:  [97.6 F (36.4 C)-98.4 F (36.9 C)] 97.6 F (36.4 C) (07/23 0317) Pulse Rate:  [77-93] 93 (07/23 0317) Cardiac Rhythm: Normal sinus rhythm (07/23 0420) Resp:  [15-23] 19 (07/23 0317) BP: (111-139)/(61-80) 139/80 (07/23 0317) SpO2:  [96 %-100 %] 100 % (07/23 0317) Weight:  [92.9 kg] 92.9 kg (07/23 0317)  Intake/Output from previous day: 07/22 0701 - 07/23 0700 In: 705 [P.O.:660; I.V.:45] Out: -   General appearance: alert, cooperative and no distress Heart: regular rate and rhythm Lungs: clear to auscultation bilaterally Abdomen: soft, non-tender; bowel sounds normal; no masses,  no organomegaly Extremities: edema + pitting Wound: clean and dry  Lab Results: No results for input(s): WBC, HGB, HCT, PLT in the last 72 hours. BMET:  Recent Labs    10/31/18 0430 11/01/18 0427  NA 138 139  K 4.1 3.9  CL 101 99  CO2 32 31  GLUCOSE 90 89  BUN 17 15  CREATININE 0.96 0.94  CALCIUM 8.9 8.9    PT/INR: No results for input(s): LABPROT, INR in the last 72 hours. ABG    Component Value Date/Time   PHART 7.376 10/05/2018 1043   HCO3 24.2 10/05/2018 1043   TCO2 24 10/04/2018 1949   ACIDBASEDEF 0.3 10/05/2018 1043   O2SAT 97.9 10/05/2018 1043   CBG (last 3)  Recent Labs    10/31/18 1548 10/31/18 2113 11/01/18 0622  GLUCAP 91 93 67*    Assessment/Plan: S/P Procedure(s) (LRB): VIDEO BRONCHOSCOPY WITH INSERTION OF INTERBRONCHIAL VALVES (IBV) (N/A)  1. CV- hemodynamically stable in NSR 2. Pulm- no acute issues, off oxygen, sats are acceptable on ambulation, continue IS at discharge, patient instructed  to follow up with pulmonologist when he gets home 3. Renal-creatinine remains stable, K is WNL, continue Lasix, potassium 4. DM- sugars controlled 5. dispo- patient stable, will d/c home today   LOS: 28 days    Erin Barrett 11/01/2018  Pulmonary status stable after EBV Incisions clean, dry DC instructions reviewed with patient including Wound care, meds, folowup and activity restrictions  Will need outpatient bronchoscopy for removal of endobronchial valves in 6 weeks  patient examined and medical record reviewed,agree with above note. [name] 11/01/2018

## 2018-11-01 NOTE — Progress Notes (Signed)
CARDIAC REHAB PHASE I   Pt on bedrest for PICC removal. Completed d/c education with pt and pts wife via phone. Pt educated on importance of showering and monitoring incisions daily. Encouraged continued IS and Flutter use, walks, and sternal precautions. Pt given in-the-tube sheet, heart healthy and diabetic diets. Reviewed restrictions, exercise guidelines, and daily weights. Will refer to CRP II [place]. Pt declining [facility] at this time.  [phone] Rufina Falco, RN BSN 11/01/2018 8:59 AM

## 2018-11-01 NOTE — TOC Transition Note (Signed)
Transition of Care [facility]) - CM/SW Discharge Note   Patient Details  Name: LIZANDRO SPELLMAN MRN: [id number] Date of Birth: Jul 13, 1950  [facility]) CM/SW Contact:  Zenon Mayo, RN Phone Number: 11/01/2018, 9:01 AM   Clinical Narrative:    From home with wife, , NCM offered choice for [facility]. Patient states he does not want a HHRN.  He states he has no problem with getting his medications. His son will be transporting him home today.  He has no other needs.   Final next level of care: [place] Barriers to Discharge: No Barriers Identified   Patient Goals and CMS Choice Patient states their goals for this hospitalization and ongoing recovery are:: to get back to where he was before he came to hospital CMS Medicare.gov Compare Post Acute Care list provided to:: Patient Choice offered to / list presented to : Patient  Discharge Placement                       Discharge Plan and Services                DME Arranged: (NA)         HH Arranged: RN, Refused HH          Social Determinants of Health (SDOH) Interventions     Readmission Risk Interventions No flowsheet data found.

## 2018-11-01 NOTE — Plan of Care (Signed)
  Problem: Activity: Goal: Risk for activity intolerance will decrease Outcome: Completed/Met   Problem: Cardiac: Goal: Will achieve and/or maintain hemodynamic stability Outcome: Completed/Met   Problem: Clinical Measurements: Goal: Postoperative complications will be avoided or minimized Outcome: Completed/Met   Problem: Respiratory: Goal: Respiratory status will improve Outcome: Completed/Met   Problem: Skin Integrity: Goal: Wound healing without signs and symptoms of infection Outcome: Completed/Met Goal: Risk for impaired skin integrity will decrease Outcome: Completed/Met   Problem: Urinary Elimination: Goal: Ability to achieve and maintain adequate renal perfusion and functioning will improve Outcome: Completed/Met   Problem: Education: Goal: Knowledge of General Education information will improve Description: Including pain rating scale, medication(s)/side effects and non-pharmacologic comfort measures Outcome: Completed/Met   Problem: Health Behavior/Discharge Planning: Goal: Ability to manage health-related needs will improve Outcome: Completed/Met   Problem: Clinical Measurements: Goal: Ability to maintain clinical measurements within normal limits will improve Outcome: Completed/Met Goal: Will remain free from infection Outcome: Completed/Met Goal: Diagnostic test results will improve Outcome: Completed/Met Goal: Respiratory complications will improve Outcome: Completed/Met Goal: Cardiovascular complication will be avoided Outcome: Completed/Met   Problem: Activity: Goal: Risk for activity intolerance will decrease Outcome: Completed/Met   Problem: Nutrition: Goal: Adequate nutrition will be maintained Outcome: Completed/Met   Problem: Coping: Goal: Level of anxiety will decrease Outcome: Completed/Met   Problem: Elimination: Goal: Will not experience complications related to bowel motility Outcome: Completed/Met Goal: Will not experience  complications related to urinary retention Outcome: Completed/Met   Problem: Pain Managment: Goal: General experience of comfort will improve Outcome: Completed/Met   Problem: Safety: Goal: Ability to remain free from injury will improve Outcome: Completed/Met   Problem: Skin Integrity: Goal: Risk for impaired skin integrity will decrease Outcome: Completed/Met

## 2018-11-01 NOTE — Progress Notes (Signed)
Pt got discharged to home, discharge instructions provided and patient showed understanding to it, IV taken out,Telemonitor DC,pt left unit in wheelchair with all of the belongings accompanied with a family member (Son)  Lalana Wachter, RN 

## 2018-11-07 ENCOUNTER — Ambulatory Visit (INDEPENDENT_AMBULATORY_CARE_PROVIDER_SITE_OTHER): Payer: Self-pay | Admitting: *Deleted

## 2018-11-07 ENCOUNTER — Other Ambulatory Visit: Payer: Self-pay

## 2018-11-07 DIAGNOSIS — Z4802 Encounter for removal of sutures: Secondary | ICD-10-CM

## 2018-11-07 DIAGNOSIS — Z951 Presence of aortocoronary bypass graft: Secondary | ICD-10-CM

## 2018-11-07 DIAGNOSIS — I2510 Atherosclerotic heart disease of native coronary artery without angina pectoris: Secondary | ICD-10-CM

## 2018-11-07 MED ORDER — PREDNISONE 5 MG PO TABS: 20.0000 mg | ORAL_TABLET | ORAL | 0 refills | Status: AC

## 2018-11-07 NOTE — Progress Notes (Signed)
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  These were easily removed.  Dr. Prescott Gum recommended vaseline/band aide x 5 days to the left site.  I had asked him to see Melvin Brewer due to the seroma of his upper left thigh EVH leg and his audible expiratory wheeze.  He prescribed Prednisone 20 mg for three days. He does have bilateral lower leg and feet swelling, but he has not taken his Lasix today due to his drive from [place].  He will return for his routine post op visit next week with a chest xray.

## 2018-11-14 ENCOUNTER — Ambulatory Visit
Admission: RE | Admit: 2018-11-14 | Discharge: 2018-11-14 | Disposition: A | Discharge status: Home / Self Care | Payer: Medicare Other | Source: Ambulatory Visit | Attending: Cardiothoracic Surgery | Admitting: Cardiothoracic Surgery

## 2018-11-14 ENCOUNTER — Ambulatory Visit (INDEPENDENT_AMBULATORY_CARE_PROVIDER_SITE_OTHER): Payer: Self-pay | Admitting: Cardiothoracic Surgery

## 2018-11-14 ENCOUNTER — Encounter: Payer: Self-pay | Admitting: Cardiothoracic Surgery

## 2018-11-14 ENCOUNTER — Other Ambulatory Visit: Payer: Self-pay | Admitting: Cardiothoracic Surgery

## 2018-11-14 ENCOUNTER — Other Ambulatory Visit: Payer: Self-pay

## 2018-11-14 VITALS — BP 117/78 | HR 71 | Temp 97.8°F | Resp 20 | Ht 69.0 in | Wt 196.0 lb

## 2018-11-14 DIAGNOSIS — Z951 Presence of aortocoronary bypass graft: Secondary | ICD-10-CM

## 2018-11-14 DIAGNOSIS — I2510 Atherosclerotic heart disease of native coronary artery without angina pectoris: Secondary | ICD-10-CM

## 2018-11-14 MED ORDER — CEPHALEXIN 500 MG PO CAPS: 500.0000 mg | ORAL_CAPSULE | Freq: Three times a day (TID) | ORAL | 0 refills | Status: DC

## 2018-11-14 NOTE — Progress Notes (Signed)
PCP is Hortencia Pilar, MD Referring Provider is Yolonda Kida, MD  Chief Complaint  Patient presents with  . Routine Post Op    5 week f/u with CXR s/p CABG    HPI: Scheduled postop visit 1 month after redo CABG.  Patient has significant COPD-emphysema and had postop pulmonary problems including pneumothorax and air leak requiring prolonged chest tube therapy. He finally had endobronchial valves placed  and the chest tube is been removed and the left lung completely reexpanded.  Today's chest x-ray is clear. He denies recurrent angina. Sternal and leg incisions are healing well. Some erythema around an old chest tube site which we treated with a course of oral doxycycline.  Patient is walking 30 minutes a day. He wants to hold off on starting cardiac rehab until the endobronchial valves are removed in September. Past Medical History:  Diagnosis Date  . AAA (abdominal aortic aneurysm) ([place])   . Abdominal aortic aneurysm (AAA) ([place])   . Allergic rhinitis   . Anginal pain ([place])   . Centrilobular emphysema ([place])   . Cervical spine degeneration   . COPD (chronic obstructive pulmonary disease) ([place])   . Coronary artery disease   . Diabetes mellitus without complication ([place])   . Diverticulitis   . DJD (degenerative joint disease)   . Eczema   . GERD (gastroesophageal reflux disease)   . H/O hiatal hernia    AGE 68 S  NO MEDS  . Hemorrhoids   . Herpes simplex infection   . Hypercholesterolemia   . Hyperglycemia   . Hyperlipidemia   . Hypertension   . Obesity   . Shortness of breath    W/ EXERTION     Past Surgical History:  Procedure Laterality Date  . ANTERIOR CERVICAL DECOMP/DISCECTOMY FUSION N/A 06/20/2013   Procedure: CERVCIAL FIVE-SIX,CERVICAL SIX-SEVEN ANTERIOR CERVICAL DECOMPRESSION WITH FUSION INTERBODY PROSTHESIS PLATING AND PEEK CAGE;  Surgeon: Ophelia Charter, MD;  Location: [place] NEURO ORS;  Service: Neurosurgery;  Laterality: N/A;  anterior  . CARDIAC  CATHETERIZATION     [place]  2007   . CHEST TUBE INSERTION Left 10/15/2018   Procedure: INSERTION OF LEFT CHEST TUBE;  Surgeon: Ivin Poot, MD;  Location: [place];  Service: Thoracic;  Laterality: Left;  . COLONOSCOPY WITH PROPOFOL N/A 12/26/2017   Procedure: COLONOSCOPY WITH PROPOFOL;  Surgeon: Lollie Sails, MD;  Location[facility] ENDOSCOPY;  Service: Endoscopy;  Laterality: N/A;  . CORONARY ARTERY BYPASS GRAFT     2007  @[facility]   . CORONARY ARTERY BYPASS GRAFT N/A 10/04/2018   Procedure: REDO CORONARY ARTERY BYPASS GRAFTING (CABG) x 1, SVG TO LAD, USING LEFT GREATER SAPHENOUS VEIN HARVESTED ENDOSCOPICALLY;  Surgeon: Ivin Poot, MD;  Location: [place];  Service: Open Heart Surgery;  Laterality: N/A;  . EYE SURGERY     RT EYE LASER (RET DETACHMENT)  . RIGHT/LEFT HEART CATH AND CORONARY/GRAFT ANGIOGRAPHY Left 09/05/2018   Procedure: RIGHT/LEFT HEART CATH AND CORONARY/GRAFT ANGIOGRAPHY;  Surgeon: Yolonda Kida, MD;  Location: [place] CV LAB;  Service: Cardiovascular;  Laterality: Left;  . TEE WITHOUT CARDIOVERSION N/A 10/04/2018   Procedure: TRANSESOPHAGEAL ECHOCARDIOGRAM (TEE);  Surgeon: Prescott Gum, Collier Salina, MD;  Location: [place];  Service: Open Heart Surgery;  Laterality: N/A;  . TONSILLECTOMY    . VIDEO BRONCHOSCOPY WITH INSERTION OF INTERBRONCHIAL VALVE (IBV) N/A 10/26/2018   Procedure: VIDEO BRONCHOSCOPY WITH INSERTION OF INTERBRONCHIAL VALVES (IBV);  Surgeon: Prescott Gum, Collier Salina, MD;  Location: [place];  Service: Thoracic;  Laterality: N/A;    History reviewed. No pertinent family history.  Social History Social History   Tobacco Use  . Smoking status: Former Smoker    Packs/day: 2.00    Years: 43.00    Pack years: 86.00    Types: Cigarettes    Quit date: 04/11/2004    Years since quitting: 14.6  . Smokeless tobacco: Never Used  Substance Use Topics  . Alcohol use: Yes    Alcohol/week: 3.0 - 4.0 standard drinks    Types: 3 - 4 Standard drinks or equivalent per week  .  Drug use: No    Current Outpatient Medications  Medication Sig Dispense Refill  . albuterol (PROVENTIL HFA;VENTOLIN HFA) 108 (90 BASE) MCG/ACT inhaler Inhale 2 puffs into the lungs every 6 (six) hours as needed for wheezing or shortness of breath.    Marland Kitchen albuterol (PROVENTIL) (2.5 MG/3ML) 0.083% nebulizer solution Inhale 2.5 mg into the lungs every 6 (six) hours as needed for wheezing or shortness of breath.    Marland Kitchen aspirin EC 81 MG tablet Take 81 mg by mouth every evening.     Marland Kitchen azelastine (ASTELIN) 0.1 % nasal spray Place 1 spray into both nostrils 2 (two) times daily. Use in each nostril as directed    . azithromycin (ZITHROMAX) 250 MG tablet Take 250 mg by mouth every Monday, Wednesday, and Friday.    . chlorpheniramine-HYDROcodone (TUSSIONEX) 10-8 MG/5ML SUER Take 5 mLs by mouth every 12 (twelve) hours as needed for cough. 140 mL 0  . Cholecalciferol (VITAMIN D3) 50 MCG (2000 UT) TABS Take 2,000 Units by mouth every morning.     . DENTA 5000 PLUS 1.1 % CREA dental cream Take 1 application by mouth at bedtime. Brush teeth for at least 1 min  5  . fexofenadine (ALLEGRA) 180 MG tablet Take 180 mg by mouth daily.    . fluticasone (FLONASE) 50 MCG/ACT nasal spray Place 1 spray into both nostrils daily.    . furosemide (LASIX) 40 MG tablet Take 1 tablet (40 mg total) by mouth daily. 30 tablet 3  . losartan (COZAAR) 25 MG tablet Take 25 mg by mouth daily.    . metFORMIN (GLUCOPHAGE) 500 MG tablet Take 500 mg by mouth 2 (two) times a day.     . metoprolol tartrate (LOPRESSOR) 25 MG tablet Take 1 tablet (25 mg total) by mouth 2 (two) times daily. 60 tablet 3  . montelukast (SINGULAIR) 10 MG tablet Take 10 mg by mouth every evening.    . potassium chloride 20 MEQ TBCR Take 20 mEq by mouth daily. 30 tablet 3  . rosuvastatin (CRESTOR) 40 MG tablet Take 40 mg by mouth daily.    . sildenafil (REVATIO) 20 MG tablet Take 20-100 mg by mouth daily as needed for erectile dysfunction.    . SYMBICORT 160-4.5  MCG/ACT inhaler Inhale 2 puffs into the lungs 2 (two) times a day.    . Tiotropium Bromide Monohydrate (SPIRIVA RESPIMAT) 2.5 MCG/ACT AERS Inhale 2 puffs into the lungs daily.     . valACYclovir (VALTREX) 500 MG tablet Take 500 mg by mouth every evening.     No current facility-administered medications for this visit.     Allergies  Allergen Reactions  . Atorvastatin Other (See Comments)    Leg cramps  . Sulfa Antibiotics Rash    Sun Rash     Review of Systems  Patient has a known asymptomatic 4.8 cm AAA. He is followed by a vascular surgeon at [facility].  He would like to be followed by [facility] in [facility] and referral will be made to [facility].  BP 117/78   Pulse 71   Temp 97.8 F (36.6 C) (Skin)   Resp 20   Ht 5' 9"  (1.753 m)   Wt 196 lb (88.9 kg)   SpO2 96%   BMI 28.94 kg/m  Physical Exam Alert and comfortably breathing Sternal incision well-healed Heart rate regular without murmur Scattered wheezes Left chest tube site with erythema and some maceration of skin. No lower leg edema  Diagnostic Tests: Chest x-ray image personally reviewed and is clear. Impression: Good recovery after redo CABG with postoperative pulmonary problems related to severe COPD.  These have now resolved.  Patient will need to have his endobronchial valves removed with outpatient bronchoscopy under anesthesia next month.  Plan: Return in September to discuss scheduling removal of endobronchial valves.  Patient may drive, lift up to 17-40 pounds and is encouraged to follow heart healthy lifestyle.   Len Childs, MD [facility] [phone]

## 2018-11-23 ENCOUNTER — Other Ambulatory Visit: Payer: Self-pay | Admitting: Physician Assistant

## 2018-11-25 NOTE — Progress Notes (Signed)
Patient ID: Melvin Brewer, male    DOB: 17-Aug-1950, 68 y.o.   MRN: [id number]  HPI  Mr Walen is a 68 y/o male with a history of CAD, DM, hyperlipidemia, HTN, COPD, GERD, former tobacco use and chronic heart failure.   Echo report from 08/29/2018 reviewed and showed an EF of 50% along with mild MR/TR.   Catheterization done 09/05/2018 showed:  Ost 1st Mrg lesion is 100% stenosed.  1st LPL lesion is 100% stenosed.  Dist LM to Ost LAD lesion is 100% stenosed.  Origin to Prox Graft lesion is 100% stenosed.  Dist Graft lesion is 50% stenosed.   Conclusion Right heart cath Mean wedge of 20 PA 45/21 mean of 30 Cardiac output of 7.2 Fick  Multivessel coronary disease including coronary bypass Preserved left ventricular function borderline ejection fraction around 50% Native vessels with flush occlusion of the LAD CTO but grafted by LIMA Circumflex large left dominant minor irregularities occlusion of OM1 and OM 2 but grafted RCA nondominant Grafts LIMA to LAD atretic and occluded SVG jump graft from OM1 to OM 2  ,50% mid lesion but patent Significant collaterals right to left Significant collaterals left to left Almost complete revascularization of the LAD through collaterals  Admitted 10/04/2018 for redo Sternotomy with CABG x 1 utilizing SVG to distal LAD.  He also underwent endoscopic harvest of greater saphenous vein from this left thigh. Needed left sided chest tub placed due to worsening pneumothorax. Developed COPD exacerbation and was treated with nebulizer, steroids and pulmonary toilet. Developed subq air into left arm which required return to the OR for repeat chest tube placement. Endobronchial valves placed on 10/26/2018. Discharged after 28 days.   He presents today for his initial visit with a chief complaint of moderate shortness of breath upon minimal exertion. He describes this as chronic in nature having been present for several years with varying levels of  intensity. He has associated fatigue, wheezing and difficulty sleeping along with this. He denies any chest pain, pedal edema, palpitations, abdominal distention, dizziness or weight gain. Has been walking at home 15 minutes at a time/ 3 times / day. Is planning on starting cardiac rehab once his endobronchial valves are removed in September.   Past Medical History:  Diagnosis Date  . AAA (abdominal aortic aneurysm) ([place])   . Abdominal aortic aneurysm (AAA) ([place])   . Allergic rhinitis   . Anginal pain ([place])   . Centrilobular emphysema ([place])   . Cervical spine degeneration   . CHF (congestive heart failure) ([place])   . COPD (chronic obstructive pulmonary disease) ([place])   . Coronary artery disease   . Diabetes mellitus without complication ([place])   . Diverticulitis   . DJD (degenerative joint disease)   . Eczema   . GERD (gastroesophageal reflux disease)   . H/O hiatal hernia    AGE 12 S  NO MEDS  . Hemorrhoids   . Herpes simplex infection   . Hypercholesterolemia   . Hyperglycemia   . Hyperlipidemia   . Hypertension   . Obesity   . Shortness of breath    W/ EXERTION    Past Surgical History:  Procedure Laterality Date  . ANTERIOR CERVICAL DECOMP/DISCECTOMY FUSION N/A 06/20/2013   Procedure: CERVCIAL FIVE-SIX,CERVICAL SIX-SEVEN ANTERIOR CERVICAL DECOMPRESSION WITH FUSION INTERBODY PROSTHESIS PLATING AND PEEK CAGE;  Surgeon: Ophelia Charter, MD;  Location: [place] NEURO ORS;  Service: Neurosurgery;  Laterality: N/A;  anterior  . CARDIAC CATHETERIZATION     [place]  2007   . CHEST TUBE INSERTION Left 10/15/2018   Procedure: INSERTION OF LEFT CHEST TUBE;  Surgeon: Ivin Poot, MD;  Location: [place];  Service: Thoracic;  Laterality: Left;  . COLONOSCOPY WITH PROPOFOL N/A 12/26/2017   Procedure: COLONOSCOPY WITH PROPOFOL;  Surgeon: Lollie Sails, MD;  Location: [facility] ENDOSCOPY;  Service: Endoscopy;  Laterality: N/A;  . CORONARY ARTERY BYPASS GRAFT     2007  '@[facility]   . CORONARY ARTERY  BYPASS GRAFT N/A 10/04/2018   Procedure: REDO CORONARY ARTERY BYPASS GRAFTING (CABG) x 1, SVG TO LAD, USING LEFT GREATER SAPHENOUS VEIN HARVESTED ENDOSCOPICALLY;  Surgeon: Ivin Poot, MD;  Location: [place];  Service: Open Heart Surgery;  Laterality: N/A;  . EYE SURGERY     RT EYE LASER (RET DETACHMENT)  . RIGHT/LEFT HEART CATH AND CORONARY/GRAFT ANGIOGRAPHY Left 09/05/2018   Procedure: RIGHT/LEFT HEART CATH AND CORONARY/GRAFT ANGIOGRAPHY;  Surgeon: Yolonda Kida, MD;  Location: [place] CV LAB;  Service: Cardiovascular;  Laterality: Left;  . TEE WITHOUT CARDIOVERSION N/A 10/04/2018   Procedure: TRANSESOPHAGEAL ECHOCARDIOGRAM (TEE);  Surgeon: Prescott Gum, Collier Salina, MD;  Location: [place];  Service: Open Heart Surgery;  Laterality: N/A;  . TONSILLECTOMY    . VIDEO BRONCHOSCOPY WITH INSERTION OF INTERBRONCHIAL VALVE (IBV) N/A 10/26/2018   Procedure: VIDEO BRONCHOSCOPY WITH INSERTION OF INTERBRONCHIAL VALVES (IBV);  Surgeon: Prescott Gum, Collier Salina, MD;  Location: [place];  Service: Thoracic;  Laterality: N/A;   History reviewed. No pertinent family history. Social History   Tobacco Use  . Smoking status: Former Smoker    Packs/day: 2.00    Years: 43.00    Pack years: 86.00    Types: Cigarettes    Quit date: 04/11/2004    Years since quitting: 14.6  . Smokeless tobacco: Never Used  Substance Use Topics  . Alcohol use: Yes    Alcohol/week: 3.0 - 4.0 standard drinks    Types: 3 - 4 Standard drinks or equivalent per week   Allergies  Allergen Reactions  . Atorvastatin Other (See Comments)    Leg cramps  . Sulfa Antibiotics Rash    Sun Rash    Prior to Admission medications   Medication Sig Start Date End Date Taking? Authorizing Provider  albuterol (PROVENTIL HFA;VENTOLIN HFA) 108 (90 BASE) MCG/ACT inhaler Inhale 2 puffs into the lungs every 6 (six) hours as needed for wheezing or shortness of breath.   Yes [provider]  albuterol (PROVENTIL) (2.5 MG/3ML) 0.083% nebulizer  solution Inhale 2.5 mg into the lungs every 6 (six) hours as needed for wheezing or shortness of breath. 04/27/18  Yes [provider]  aspirin EC 81 MG tablet Take 81 mg by mouth every evening.    Yes [provider]  azelastine (ASTELIN) 0.1 % nasal spray Place 1 spray into both nostrils 2 (two) times daily. Use in each nostril as directed   Yes [provider]  chlorpheniramine-HYDROcodone (TUSSIONEX) 10-8 MG/5ML SUER Take 5 mLs by mouth every 12 (twelve) hours as needed for cough. 11/01/18  Yes Barrett, Lodema Hong, PA-C  Cholecalciferol (VITAMIN D3) 50 MCG (2000 UT) TABS Take 2,000 Units by mouth every morning.    Yes [provider]  DENTA 5000 PLUS 1.1 % CREA dental cream Take 1 application by mouth at bedtime. Brush teeth for at least 1 min 05/09/17  Yes [provider]  fexofenadine (ALLEGRA) 180 MG tablet Take 180 mg by mouth daily. 05/27/18  Yes [provider]  fluticasone (FLONASE) 50  MCG/ACT nasal spray Place 1 spray into both nostrils daily.   Yes [provider]  furosemide (LASIX) 40 MG tablet Take 1 tablet (40 mg total) by mouth daily. 11/01/18  Yes Barrett, Erin R, PA-C  losartan (COZAAR) 25 MG tablet Take 25 mg by mouth daily. 07/08/18  Yes [provider]  metFORMIN (GLUCOPHAGE) 500 MG tablet Take 500 mg by mouth 2 (two) times a day.  05/04/17  Yes [provider]  metoprolol tartrate (LOPRESSOR) 25 MG tablet Take 1 tablet (25 mg total) by mouth 2 (two) times daily. 11/01/18  Yes Barrett, Erin R, PA-C  montelukast (SINGULAIR) 10 MG tablet Take 10 mg by mouth every evening. 07/02/18  Yes [provider]  potassium chloride 20 MEQ TBCR Take 20 mEq by mouth daily. 11/01/18  Yes Barrett, Erin R, PA-C  rosuvastatin (CRESTOR) 40 MG tablet Take 40 mg by mouth daily.   Yes [provider]  sildenafil (REVATIO) 20 MG tablet Take 20-100 mg by mouth daily as needed for erectile dysfunction. 12/07/17  Yes  [provider]  SYMBICORT 160-4.5 MCG/ACT inhaler Inhale 2 puffs into the lungs 2 (two) times a day. 05/07/18  Yes [provider]  Tiotropium Bromide Monohydrate (SPIRIVA RESPIMAT) 2.5 MCG/ACT AERS Inhale 2 puffs into the lungs daily.  03/06/17  Yes [provider]  valACYclovir (VALTREX) 500 MG tablet Take 500 mg by mouth every evening. 06/13/18  Yes [provider]    Review of Systems  Constitutional: Positive for fatigue. Negative for appetite change.  HENT: Negative for congestion, rhinorrhea and sore throat.   Eyes: Negative.   Respiratory: Positive for shortness of breath (with minimal exertion) and wheezing. Negative for chest tightness.   Cardiovascular: Negative for chest pain, palpitations and leg swelling.  Gastrointestinal: Negative for abdominal distention and abdominal pain.  Endocrine: Negative.   Genitourinary: Negative.   Musculoskeletal: Positive for arthralgias (chest is still a little sore from CABG). Negative for back pain.  Skin: Negative.   Allergic/Immunologic: Negative.   Neurological: Negative for dizziness and light-headedness.  Hematological: Negative for adenopathy. Does not bruise/bleed easily.  Psychiatric/Behavioral: Positive for sleep disturbance. Negative for dysphoric mood. The patient is not nervous/anxious.     Vitals:   11/26/18 0926  BP: (!) 146/78  Pulse: 63  Resp: 18  SpO2: 99%  Weight: 200 lb (90.7 kg)  Height: 5' 9"  (1.753 m)   Wt Readings from Last 3 Encounters:  11/26/18 200 lb (90.7 kg)  11/14/18 196 lb (88.9 kg)  11/01/18 204 lb 12.9 oz (92.9 kg)   Lab Results  Component Value Date   CREATININE 0.94 11/01/2018   CREATININE 0.96 10/31/2018   CREATININE 1.11 10/30/2018     Physical Exam Vitals signs and nursing note reviewed.  Constitutional:      Appearance: He is well-developed.  HENT:     Head: Normocephalic and atraumatic.  Neck:     Musculoskeletal: Normal range of motion.      Vascular: No JVD.  Cardiovascular:     Rate and Rhythm: Normal rate and regular rhythm.  Pulmonary:     Effort: Pulmonary effort is normal. No accessory muscle usage.     Breath sounds: Examination of the right-upper field reveals wheezing. Examination of the left-upper field reveals wheezing. Examination of the right-lower field reveals wheezing. Examination of the left-lower field reveals wheezing. Wheezing present. No rales.  Chest:     Chest wall: Tenderness (over sternum) present.  Abdominal:  Palpations: Abdomen is soft.     Tenderness: There is no abdominal tenderness.  Musculoskeletal:     Right lower leg: He exhibits no tenderness. No edema.     Left lower leg: He exhibits no tenderness. No edema.  Skin:    General: Skin is warm and dry.  Neurological:     General: No focal deficit present.     Mental Status: He is alert and oriented to person, place, and time.  Psychiatric:        Mood and Affect: Mood normal.        Behavior: Behavior normal.    Assessment & Plan:  1: Chronic heart failure with preserved ejection fraction- - NYHA class III - euvolemic today - weighing daily and he was instructed to call for an overnight weight gain of >2 pounds or a weekly weight gain of >5 pounds - not adding salt and has been reading food labels; ate chinese food last night and says that his weight was up today which he knows is because of what he ate; reviewed the importance of closely following a 2088m sodium diet - says that he doesn't take his furosemide on days that he has a lot to do and estimates that he skips 1-2 doses/ week - saw cardiology (Clayborn Bigness 11/15/2018 - does drink 1 scotch a few times / week - BNP 08/23/2018 was 196.0  2: HTN- - BP looks good today - saw PCP ([facility]) 05/02/2018 - BMP 11/01/2018 reviewed and showed sodium 139, potassium 2.9, creatinine 0.94 and GFR >60  3: DM- - A1c 10/01/2018 was 6.2% - glucose this morning was 134 at home  4: CABG-   -continues with endobronchial valves   - saw cardiothoracic surgeon (Lucianne LeiTright) 11/14/2018 - is hoping to start cardiac rehab once his endobronchial valves are removed; continue walking at home  5: COPD- - quit smoking ~ 2007 - saw pulmonology (Raul Del 11/15/2018 - quite a bit of wheezing heard today; patient does have nebulizer at home to use  Patient did not bring his medications nor a list. Each medication was verbally reviewed with the patient and he was encouraged to bring the bottles to every visit to confirm accuracy of list.  Return in 2 months or sooner for any questions/problems before then.

## 2018-11-26 ENCOUNTER — Other Ambulatory Visit: Payer: Self-pay

## 2018-11-26 ENCOUNTER — Encounter: Payer: Self-pay | Admitting: Family

## 2018-11-26 ENCOUNTER — Ambulatory Visit: Payer: Medicare Other | Attending: Family | Admitting: Family

## 2018-11-26 VITALS — BP 146/78 | HR 63 | Resp 18 | Ht 69.0 in | Wt 200.0 lb

## 2018-11-26 DIAGNOSIS — K449 Diaphragmatic hernia without obstruction or gangrene: Secondary | ICD-10-CM | POA: Insufficient documentation

## 2018-11-26 DIAGNOSIS — Z7984 Long term (current) use of oral hypoglycemic drugs: Secondary | ICD-10-CM | POA: Insufficient documentation

## 2018-11-26 DIAGNOSIS — Z7982 Long term (current) use of aspirin: Secondary | ICD-10-CM | POA: Insufficient documentation

## 2018-11-26 DIAGNOSIS — R5383 Other fatigue: Secondary | ICD-10-CM | POA: Diagnosis not present

## 2018-11-26 DIAGNOSIS — I2510 Atherosclerotic heart disease of native coronary artery without angina pectoris: Secondary | ICD-10-CM | POA: Insufficient documentation

## 2018-11-26 DIAGNOSIS — E7800 Pure hypercholesterolemia, unspecified: Secondary | ICD-10-CM | POA: Insufficient documentation

## 2018-11-26 DIAGNOSIS — I714 Abdominal aortic aneurysm, without rupture: Secondary | ICD-10-CM | POA: Insufficient documentation

## 2018-11-26 DIAGNOSIS — Z79899 Other long term (current) drug therapy: Secondary | ICD-10-CM | POA: Diagnosis not present

## 2018-11-26 DIAGNOSIS — J441 Chronic obstructive pulmonary disease with (acute) exacerbation: Secondary | ICD-10-CM | POA: Insufficient documentation

## 2018-11-26 DIAGNOSIS — R0602 Shortness of breath: Secondary | ICD-10-CM | POA: Diagnosis present

## 2018-11-26 DIAGNOSIS — I110 Hypertensive heart disease with heart failure: Secondary | ICD-10-CM | POA: Diagnosis not present

## 2018-11-26 DIAGNOSIS — Z951 Presence of aortocoronary bypass graft: Secondary | ICD-10-CM | POA: Diagnosis not present

## 2018-11-26 DIAGNOSIS — R062 Wheezing: Secondary | ICD-10-CM | POA: Insufficient documentation

## 2018-11-26 DIAGNOSIS — I2582 Chronic total occlusion of coronary artery: Secondary | ICD-10-CM | POA: Diagnosis not present

## 2018-11-26 DIAGNOSIS — G479 Sleep disorder, unspecified: Secondary | ICD-10-CM | POA: Diagnosis not present

## 2018-11-26 DIAGNOSIS — I5032 Chronic diastolic (congestive) heart failure: Secondary | ICD-10-CM | POA: Diagnosis not present

## 2018-11-26 DIAGNOSIS — E119 Type 2 diabetes mellitus without complications: Secondary | ICD-10-CM | POA: Diagnosis not present

## 2018-11-26 DIAGNOSIS — Z87891 Personal history of nicotine dependence: Secondary | ICD-10-CM | POA: Insufficient documentation

## 2018-11-26 DIAGNOSIS — E785 Hyperlipidemia, unspecified: Secondary | ICD-10-CM | POA: Insufficient documentation

## 2018-11-26 DIAGNOSIS — J449 Chronic obstructive pulmonary disease, unspecified: Secondary | ICD-10-CM

## 2018-11-26 DIAGNOSIS — M1990 Unspecified osteoarthritis, unspecified site: Secondary | ICD-10-CM | POA: Insufficient documentation

## 2018-11-26 DIAGNOSIS — I10 Essential (primary) hypertension: Secondary | ICD-10-CM

## 2018-11-26 NOTE — Patient Instructions (Signed)
Continue weighing daily and call for an overnight weight gain of > 2 pounds or a weekly weight gain of >5 pounds. 

## 2018-12-08 LAB — ACID FAST CULTURE WITH REFLEXED SENSITIVITIES (MYCOBACTERIA): Acid Fast Culture: NEGATIVE

## 2018-12-25 ENCOUNTER — Other Ambulatory Visit: Payer: Self-pay | Admitting: Physician Assistant

## 2018-12-25 ENCOUNTER — Other Ambulatory Visit: Payer: Self-pay | Admitting: Cardiothoracic Surgery

## 2018-12-25 DIAGNOSIS — Z951 Presence of aortocoronary bypass graft: Secondary | ICD-10-CM

## 2018-12-26 ENCOUNTER — Encounter: Payer: Self-pay | Admitting: Cardiothoracic Surgery

## 2018-12-26 ENCOUNTER — Ambulatory Visit (INDEPENDENT_AMBULATORY_CARE_PROVIDER_SITE_OTHER): Payer: Self-pay | Admitting: Cardiothoracic Surgery

## 2018-12-26 ENCOUNTER — Other Ambulatory Visit: Payer: Self-pay | Admitting: *Deleted

## 2018-12-26 ENCOUNTER — Ambulatory Visit
Admission: RE | Admit: 2018-12-26 | Discharge: 2018-12-26 | Disposition: A | Discharge status: Home / Self Care | Payer: Medicare Other | Source: Ambulatory Visit | Attending: Cardiothoracic Surgery | Admitting: Cardiothoracic Surgery

## 2018-12-26 ENCOUNTER — Other Ambulatory Visit: Payer: Self-pay

## 2018-12-26 VITALS — BP 122/77 | HR 56 | Temp 97.8°F | Resp 20 | Ht 69.0 in | Wt 201.0 lb

## 2018-12-26 DIAGNOSIS — T797XXS Traumatic subcutaneous emphysema, sequela: Secondary | ICD-10-CM

## 2018-12-26 DIAGNOSIS — Z951 Presence of aortocoronary bypass graft: Secondary | ICD-10-CM

## 2018-12-26 DIAGNOSIS — I2510 Atherosclerotic heart disease of native coronary artery without angina pectoris: Secondary | ICD-10-CM

## 2018-12-26 NOTE — Progress Notes (Signed)
PCP is Hortencia Pilar, MD Referring Provider is Yolonda Kida, MD  Chief Complaint  Patient presents with  . Routine Post Op    6 week f/u with CXR    HPI: Patient returns for follow-up and discussion.  He had redo CABG in late June.  He has significant COPD and had an air leak from the left lung with subcutaneous emphysema.  After conservative measures with chest tube did not help he underwent intrabronchial valve placement in the left upper lobe and lingula with resolution of the air leak and subcutaneous emphysema.  He has been discharged from the hospital doing well.  He has had no recurrent angina.  He has baseline dyspnea on exertion from his COPD but symptoms no active flareup, no productive cough wheezing.  He is now 8 weeks after the bronchial valve placement so the lung parenchyma should be healed and we will plan on removing the valves under general anesthesia with video bronchoscopy at [facility] on Monday, June 21.  I discussed the procedure of bronchoscopy and valve removal with the patient and his wife.  They understand it will be outpatient.  Past Medical History:  Diagnosis Date  . AAA (abdominal aortic aneurysm) ([place])   . Abdominal aortic aneurysm (AAA) ([place])   . Allergic rhinitis   . Anginal pain ([place])   . Centrilobular emphysema ([place])   . Cervical spine degeneration   . CHF (congestive heart failure) ([place])   . COPD (chronic obstructive pulmonary disease) ([place])   . Coronary artery disease   . Diabetes mellitus without complication ([place])   . Diverticulitis   . DJD (degenerative joint disease)   . Eczema   . GERD (gastroesophageal reflux disease)   . H/O hiatal hernia    AGE 68 S  NO MEDS  . Hemorrhoids   . Herpes simplex infection   . Hypercholesterolemia   . Hyperglycemia   . Hyperlipidemia   . Hypertension   . Obesity   . Shortness of breath    W/ EXERTION     Past Surgical History:  Procedure Laterality Date  . ANTERIOR CERVICAL  DECOMP/DISCECTOMY FUSION N/A 06/20/2013   Procedure: CERVCIAL FIVE-SIX,CERVICAL SIX-SEVEN ANTERIOR CERVICAL DECOMPRESSION WITH FUSION INTERBODY PROSTHESIS PLATING AND PEEK CAGE;  Surgeon: Ophelia Charter, MD;  Location: [place] NEURO ORS;  Service: Neurosurgery;  Laterality: N/A;  anterior  . CARDIAC CATHETERIZATION     [place]  2007   . CHEST TUBE INSERTION Left 10/15/2018   Procedure: INSERTION OF LEFT CHEST TUBE;  Surgeon: Ivin Poot, MD;  Location: [place];  Service: Thoracic;  Laterality: Left;  . COLONOSCOPY WITH PROPOFOL N/A 12/26/2017   Procedure: COLONOSCOPY WITH PROPOFOL;  Surgeon: Lollie Sails, MD;  Location: [facility] ENDOSCOPY;  Service: Endoscopy;  Laterality: N/A;  . CORONARY ARTERY BYPASS GRAFT     2007  '@[place]'$   . CORONARY ARTERY BYPASS GRAFT N/A 10/04/2018   Procedure: REDO CORONARY ARTERY BYPASS GRAFTING (CABG) x 1, SVG TO LAD, USING LEFT GREATER SAPHENOUS VEIN HARVESTED ENDOSCOPICALLY;  Surgeon: Ivin Poot, MD;  Location: [place];  Service: Open Heart Surgery;  Laterality: N/A;  . EYE SURGERY     RT EYE LASER (RET DETACHMENT)  . RIGHT/LEFT HEART CATH AND CORONARY/GRAFT ANGIOGRAPHY Left 09/05/2018   Procedure: RIGHT/LEFT HEART CATH AND CORONARY/GRAFT ANGIOGRAPHY;  Surgeon: Yolonda Kida, MD;  Location: [place] CV LAB;  Service: Cardiovascular;  Laterality: Left;  . TEE WITHOUT CARDIOVERSION N/A 10/04/2018   Procedure: TRANSESOPHAGEAL ECHOCARDIOGRAM (TEE);  Surgeon: Prescott Gum, Collier Salina, MD;  Location: [place];  Service: Open Heart Surgery;  Laterality: N/A;  . TONSILLECTOMY    . VIDEO BRONCHOSCOPY WITH INSERTION OF INTERBRONCHIAL VALVE (IBV) N/A 10/26/2018   Procedure: VIDEO BRONCHOSCOPY WITH INSERTION OF INTERBRONCHIAL VALVES (IBV);  Surgeon: Prescott Gum, Collier Salina, MD;  Location: [place];  Service: Thoracic;  Laterality: N/A;    History reviewed. No pertinent family history.  Social History Social History   Tobacco Use  . Smoking status: Former Smoker    Packs/day: 2.00     Years: 43.00    Pack years: 86.00    Types: Cigarettes    Quit date: 04/11/2004    Years since quitting: 14.7  . Smokeless tobacco: Never Used  Substance Use Topics  . Alcohol use: Yes    Alcohol/week: 3.0 - 4.0 standard drinks    Types: 3 - 4 Standard drinks or equivalent per week  . Drug use: No    Current Outpatient Medications  Medication Sig Dispense Refill  . albuterol (PROVENTIL HFA;VENTOLIN HFA) 108 (90 BASE) MCG/ACT inhaler Inhale 2 puffs into the lungs every 6 (six) hours as needed for wheezing or shortness of breath.    Marland Kitchen albuterol (PROVENTIL) (2.5 MG/3ML) 0.083% nebulizer solution Inhale 2.5 mg into the lungs every 6 (six) hours as needed for wheezing or shortness of breath.    Marland Kitchen aspirin EC 81 MG tablet Take 81 mg by mouth every evening.     Marland Kitchen azelastine (ASTELIN) 0.1 % nasal spray Place 1 spray into both nostrils 2 (two) times daily. Use in each nostril as directed    . chlorpheniramine-HYDROcodone (TUSSIONEX) 10-8 MG/5ML SUER Take 5 mLs by mouth every 12 (twelve) hours as needed for cough. 140 mL 0  . Cholecalciferol (VITAMIN D3) 50 MCG (2000 UT) TABS Take 2,000 Units by mouth every morning.     . DENTA 5000 PLUS 1.1 % CREA dental cream Take 1 application by mouth at bedtime. Brush teeth for at least 1 min  5  . fexofenadine (ALLEGRA) 180 MG tablet Take 180 mg by mouth daily.    . fluticasone (FLONASE) 50 MCG/ACT nasal spray Place 1 spray into both nostrils daily.    . furosemide (LASIX) 40 MG tablet Take 1 tablet (40 mg total) by mouth daily. 30 tablet 3  . losartan (COZAAR) 25 MG tablet Take 25 mg by mouth daily.    . metoprolol tartrate (LOPRESSOR) 25 MG tablet Take 1 tablet (25 mg total) by mouth 2 (two) times daily. 60 tablet 3  . montelukast (SINGULAIR) 10 MG tablet Take 10 mg by mouth every evening.    . potassium chloride 20 MEQ TBCR Take 20 mEq by mouth daily. 30 tablet 3  . rosuvastatin (CRESTOR) 40 MG tablet Take 40 mg by mouth daily.    . sildenafil (REVATIO)  20 MG tablet Take 20-100 mg by mouth daily as needed for erectile dysfunction.    . SYMBICORT 160-4.5 MCG/ACT inhaler Inhale 2 puffs into the lungs 2 (two) times a day.    . Tiotropium Bromide Monohydrate (SPIRIVA RESPIMAT) 2.5 MCG/ACT AERS Inhale 2 puffs into the lungs daily.     . valACYclovir (VALTREX) 500 MG tablet Take 500 mg by mouth every evening.    . metFORMIN (GLUCOPHAGE) 500 MG tablet Take 500 mg by mouth 2 (two) times a day.      No current facility-administered medications for this visit.     Allergies  Allergen Reactions  . Atorvastatin Other (See Comments)  Leg cramps  . Sulfa Antibiotics Rash    Sun Rash     Review of Systems  No change in his symptoms of COPD No chest pain No fever Walking daily No edema No headache No difficulty swallowing or abdominal pain  BP 122/77   Pulse (!) 56   Temp 97.8 F (36.6 C) (Skin)   Resp 20   Ht 5' 9" (1.753 m)   Wt 201 lb (91.2 kg)   SpO2 98% Comment: RA  BMI 29.68 kg/m  Physical Exam       Exam    General- alert and comfortable    Neck- no JVD, no cervical adenopathy palpable, no carotid bruit   Lungs- clear without rales, wheezes   Cor- regular rate and rhythm, no murmur , gallop   Abdomen- soft, non-tender   Extremities - warm, non-tender, minimal edema   Neuro- oriented, appropriate, no focal weakness  Diagnostic Tests: Chest x-ray reviewed.  No pneumothorax.  2 valves are present in the left upper lobe-anterior segment and lingular segment  Impression: Resolved air leak from left upper lobe.  It is time to remove the intrabronchial valves.  Plan: Surgery for removal of IBV from left upper lobe scheduled at [facility] September 21.   Len Childs, MD [facility] [phone]

## 2018-12-28 ENCOUNTER — Other Ambulatory Visit: Payer: Self-pay

## 2018-12-28 ENCOUNTER — Encounter (HOSPITAL_COMMUNITY): Payer: Self-pay | Admitting: *Deleted

## 2018-12-28 ENCOUNTER — Other Ambulatory Visit (HOSPITAL_COMMUNITY)
Admission: RE | Admit: 2018-12-28 | Discharge: 2018-12-28 | Disposition: A | Discharge status: Home / Self Care | Payer: Medicare Other | Source: Ambulatory Visit | Attending: Cardiothoracic Surgery | Admitting: Cardiothoracic Surgery

## 2018-12-28 DIAGNOSIS — Z01812 Encounter for preprocedural laboratory examination: Secondary | ICD-10-CM | POA: Diagnosis present

## 2018-12-28 DIAGNOSIS — Z20828 Contact with and (suspected) exposure to other viral communicable diseases: Secondary | ICD-10-CM | POA: Diagnosis not present

## 2018-12-28 NOTE — Progress Notes (Signed)
Pt denies any acute cardiopulmonary issues. Pt stated that he is under the care of Dr. Hortencia Pilar, PCP and Dr. Lujean Amel, Cardiology.  Pt made aware to stop taking Melatonin, vitamins, fish oil and herbal medications. Do not take any NSAIDs ie: Ibuprofen, Advil, Naproxen (Aleve), Motrin, BC and Goody Powder. Pt made aware to check CBG every 2 hours prior to arrival to hospital on DOS. Pt made aware to treat a CBG < 70 with 4 ounces of apple or cranberry juice, wait 15 minutes after intervention to recheck BG, if BG remains < 70, call Short Stay unit to speak with a nurse. Pt verbalized understanding of all pre-op instructions.

## 2018-12-29 LAB — NOVEL CORONAVIRUS, NAA (HOSP ORDER, SEND-OUT TO REF LAB; TAT 18-24 HRS): SARS-CoV-2, NAA: NOT DETECTED

## 2018-12-31 ENCOUNTER — Other Ambulatory Visit: Payer: Self-pay

## 2018-12-31 ENCOUNTER — Ambulatory Visit (HOSPITAL_COMMUNITY)
Admission: RE | Admit: 2018-12-31 | Discharge: 2018-12-31 | Disposition: A | Discharge status: Home / Self Care | Payer: Medicare Other | Attending: Cardiothoracic Surgery | Admitting: Cardiothoracic Surgery

## 2018-12-31 ENCOUNTER — Ambulatory Visit (HOSPITAL_COMMUNITY): Payer: Medicare Other | Admitting: Certified Registered Nurse Anesthetist

## 2018-12-31 ENCOUNTER — Ambulatory Visit (HOSPITAL_COMMUNITY): Payer: Medicare Other

## 2018-12-31 ENCOUNTER — Encounter (HOSPITAL_COMMUNITY): Payer: Self-pay | Admitting: *Deleted

## 2018-12-31 ENCOUNTER — Encounter (HOSPITAL_COMMUNITY)
Admission: RE | Disposition: A | Discharge status: Home / Self Care | Payer: Self-pay | Source: Home / Self Care | Attending: Cardiothoracic Surgery

## 2018-12-31 DIAGNOSIS — E1151 Type 2 diabetes mellitus with diabetic peripheral angiopathy without gangrene: Secondary | ICD-10-CM | POA: Insufficient documentation

## 2018-12-31 DIAGNOSIS — E7800 Pure hypercholesterolemia, unspecified: Secondary | ICD-10-CM | POA: Insufficient documentation

## 2018-12-31 DIAGNOSIS — Z6829 Body mass index (BMI) 29.0-29.9, adult: Secondary | ICD-10-CM | POA: Diagnosis not present

## 2018-12-31 DIAGNOSIS — Z7982 Long term (current) use of aspirin: Secondary | ICD-10-CM | POA: Insufficient documentation

## 2018-12-31 DIAGNOSIS — Z951 Presence of aortocoronary bypass graft: Secondary | ICD-10-CM | POA: Diagnosis not present

## 2018-12-31 DIAGNOSIS — Z79899 Other long term (current) drug therapy: Secondary | ICD-10-CM | POA: Insufficient documentation

## 2018-12-31 DIAGNOSIS — T797XXS Traumatic subcutaneous emphysema, sequela: Secondary | ICD-10-CM

## 2018-12-31 DIAGNOSIS — I110 Hypertensive heart disease with heart failure: Secondary | ICD-10-CM | POA: Diagnosis not present

## 2018-12-31 DIAGNOSIS — K219 Gastro-esophageal reflux disease without esophagitis: Secondary | ICD-10-CM | POA: Diagnosis not present

## 2018-12-31 DIAGNOSIS — E669 Obesity, unspecified: Secondary | ICD-10-CM | POA: Diagnosis not present

## 2018-12-31 DIAGNOSIS — J432 Centrilobular emphysema: Secondary | ICD-10-CM | POA: Diagnosis not present

## 2018-12-31 DIAGNOSIS — M1990 Unspecified osteoarthritis, unspecified site: Secondary | ICD-10-CM | POA: Insufficient documentation

## 2018-12-31 DIAGNOSIS — Z7951 Long term (current) use of inhaled steroids: Secondary | ICD-10-CM | POA: Insufficient documentation

## 2018-12-31 DIAGNOSIS — Z7952 Long term (current) use of systemic steroids: Secondary | ICD-10-CM | POA: Diagnosis not present

## 2018-12-31 DIAGNOSIS — Z882 Allergy status to sulfonamides status: Secondary | ICD-10-CM | POA: Diagnosis not present

## 2018-12-31 DIAGNOSIS — J9382 Other air leak: Secondary | ICD-10-CM | POA: Insufficient documentation

## 2018-12-31 DIAGNOSIS — E1136 Type 2 diabetes mellitus with diabetic cataract: Secondary | ICD-10-CM | POA: Insufficient documentation

## 2018-12-31 DIAGNOSIS — I2510 Atherosclerotic heart disease of native coronary artery without angina pectoris: Secondary | ICD-10-CM | POA: Insufficient documentation

## 2018-12-31 DIAGNOSIS — Z888 Allergy status to other drugs, medicaments and biological substances status: Secondary | ICD-10-CM | POA: Diagnosis not present

## 2018-12-31 DIAGNOSIS — Z87891 Personal history of nicotine dependence: Secondary | ICD-10-CM | POA: Insufficient documentation

## 2018-12-31 DIAGNOSIS — I509 Heart failure, unspecified: Secondary | ICD-10-CM | POA: Diagnosis not present

## 2018-12-31 DIAGNOSIS — J449 Chronic obstructive pulmonary disease, unspecified: Secondary | ICD-10-CM

## 2018-12-31 DIAGNOSIS — J95811 Postprocedural pneumothorax: Secondary | ICD-10-CM

## 2018-12-31 HISTORY — PX: VIDEO BRONCHOSCOPY WITH INSERTION OF INTERBRONCHIAL VALVE (IBV): SHX6178

## 2018-12-31 HISTORY — DX: Presence of spectacles and contact lenses: Z97.3

## 2018-12-31 HISTORY — DX: Unspecified macular degeneration: H35.30

## 2018-12-31 HISTORY — DX: Unspecified cataract: H26.9

## 2018-12-31 HISTORY — DX: Presence of dental prosthetic device (complete) (partial): Z97.2

## 2018-12-31 LAB — CBC
HCT: 45.5 % (ref 39.0–52.0)
Hemoglobin: 14.5 g/dL (ref 13.0–17.0)
MCH: 31 pg (ref 26.0–34.0)
MCHC: 31.9 g/dL (ref 30.0–36.0)
MCV: 97.4 fL (ref 80.0–100.0)
Platelets: 189 10*3/uL (ref 150–400)
RBC: 4.67 MIL/uL (ref 4.22–5.81)
RDW: 14.5 % (ref 11.5–15.5)
WBC: 8.5 10*3/uL (ref 4.0–10.5)
nRBC: 0 % (ref 0.0–0.2)

## 2018-12-31 LAB — COMPREHENSIVE METABOLIC PANEL
ALT: 57 U/L — ABNORMAL HIGH (ref 0–44)
AST: 32 U/L (ref 15–41)
Albumin: 3.7 g/dL (ref 3.5–5.0)
Alkaline Phosphatase: 47 U/L (ref 38–126)
Anion gap: 11 (ref 5–15)
BUN: 15 mg/dL (ref 8–23)
CO2: 26 mmol/L (ref 22–32)
Calcium: 9.1 mg/dL (ref 8.9–10.3)
Chloride: 103 mmol/L (ref 98–111)
Creatinine, Ser: 1.17 mg/dL (ref 0.61–1.24)
GFR calc Af Amer: >60 mL/min (ref >60)
GFR calc non Af Amer: >60 mL/min (ref >60)
Glucose, Bld: 122 mg/dL — ABNORMAL HIGH (ref 70–99)
Potassium: 4 mmol/L (ref 3.5–5.1)
Sodium: 140 mmol/L (ref 135–145)
Total Bilirubin: 0.5 mg/dL (ref 0.3–1.2)
Total Protein: 6.1 g/dL — ABNORMAL LOW (ref 6.5–8.1)

## 2018-12-31 LAB — GLUCOSE, CAPILLARY
Glucose-Capillary: 104 mg/dL — ABNORMAL HIGH (ref 70–99)
Glucose-Capillary: 114 mg/dL — ABNORMAL HIGH (ref 70–99)

## 2018-12-31 LAB — APTT: aPTT: 29 seconds (ref 24–36)

## 2018-12-31 LAB — SURGICAL PCR SCREEN
MRSA, PCR: NEGATIVE
Staphylococcus aureus: NEGATIVE

## 2018-12-31 LAB — PROTIME-INR
INR: 1 (ref 0.8–1.2)
Prothrombin Time: 13.5 seconds (ref 11.4–15.2)

## 2018-12-31 SURGERY — BRONCHOSCOPY, FLEXIBLE, WITH INTRABRONCHIAL VALVE INSERTION
Anesthesia: General

## 2018-12-31 MED ORDER — PHENYLEPHRINE 40 MCG/ML (10ML) SYRINGE FOR IV PUSH (FOR BLOOD PRESSURE SUPPORT)
PREFILLED_SYRINGE | INTRAVENOUS | Status: DC | PRN
  Administered 2018-12-31 (×2): 80 ug via INTRAVENOUS

## 2018-12-31 MED ORDER — PHENYLEPHRINE 40 MCG/ML (10ML) SYRINGE FOR IV PUSH (FOR BLOOD PRESSURE SUPPORT)
PREFILLED_SYRINGE | INTRAVENOUS | Status: AC
  Filled 2018-12-31: qty 10

## 2018-12-31 MED ORDER — 0.9 % SODIUM CHLORIDE (POUR BTL) OPTIME
TOPICAL | Status: DC | PRN
  Administered 2018-12-31: 1000 mL

## 2018-12-31 MED ORDER — ROCURONIUM BROMIDE 10 MG/ML (PF) SYRINGE
PREFILLED_SYRINGE | INTRAVENOUS | Status: AC
  Filled 2018-12-31: qty 10

## 2018-12-31 MED ORDER — ROCURONIUM BROMIDE 100 MG/10ML IV SOLN
INTRAVENOUS | Status: DC | PRN
  Administered 2018-12-31: 60 mg via INTRAVENOUS

## 2018-12-31 MED ORDER — PROPOFOL 10 MG/ML IV BOLUS
INTRAVENOUS | Status: AC
  Filled 2018-12-31: qty 40

## 2018-12-31 MED ORDER — SODIUM CHLORIDE 0.9% FLUSH: 3.0000 mL | Freq: Two times a day (BID) | INTRAVENOUS | Status: DC

## 2018-12-31 MED ORDER — ONDANSETRON HCL 4 MG/2ML IJ SOLN
INTRAMUSCULAR | Status: DC | PRN
  Administered 2018-12-31: 4 mg via INTRAVENOUS

## 2018-12-31 MED ORDER — SODIUM CHLORIDE 0.9% FLUSH: 3.0000 mL | INTRAVENOUS | Status: DC | PRN

## 2018-12-31 MED ORDER — LIDOCAINE 2% (20 MG/ML) 5 ML SYRINGE
INTRAMUSCULAR | Status: AC
  Filled 2018-12-31: qty 5

## 2018-12-31 MED ORDER — MIDAZOLAM HCL 5 MG/5ML IJ SOLN
INTRAMUSCULAR | Status: DC | PRN
  Administered 2018-12-31: 2 mg via INTRAVENOUS

## 2018-12-31 MED ORDER — MUPIROCIN 2 % EX OINT
TOPICAL_OINTMENT | CUTANEOUS | Status: AC
  Administered 2018-12-31: 1 via NASAL
  Filled 2018-12-31: qty 22

## 2018-12-31 MED ORDER — MIDAZOLAM HCL 2 MG/2ML IJ SOLN: 0.5000 mg | Freq: Once | INTRAMUSCULAR | Status: DC | PRN

## 2018-12-31 MED ORDER — PROPOFOL 10 MG/ML IV BOLUS
INTRAVENOUS | Status: DC | PRN
  Administered 2018-12-31: 100 mg via INTRAVENOUS

## 2018-12-31 MED ORDER — DEXTROSE IN LACTATED RINGERS 5 % IV SOLN: INTRAVENOUS | Status: DC

## 2018-12-31 MED ORDER — OXYCODONE HCL 5 MG PO TABS: 5.0000 mg | ORAL_TABLET | ORAL | Status: DC | PRN

## 2018-12-31 MED ORDER — MIDAZOLAM HCL 2 MG/2ML IJ SOLN
INTRAMUSCULAR | Status: AC
  Filled 2018-12-31: qty 2

## 2018-12-31 MED ORDER — FENTANYL CITRATE (PF) 100 MCG/2ML IJ SOLN: 25.0000 ug | INTRAMUSCULAR | Status: DC | PRN

## 2018-12-31 MED ORDER — LIDOCAINE 2% (20 MG/ML) 5 ML SYRINGE
INTRAMUSCULAR | Status: DC | PRN
  Administered 2018-12-31: 40 mg via INTRAVENOUS

## 2018-12-31 MED ORDER — FENTANYL CITRATE (PF) 100 MCG/2ML IJ SOLN
INTRAMUSCULAR | Status: DC | PRN
  Administered 2018-12-31: 50 ug via INTRAVENOUS

## 2018-12-31 MED ORDER — EPINEPHRINE PF 1 MG/ML IJ SOLN
INTRAMUSCULAR | Status: DC | PRN
  Administered 2018-12-31: 1 mg via ENDOTRACHEOPULMONARY

## 2018-12-31 MED ORDER — DEXAMETHASONE SODIUM PHOSPHATE 10 MG/ML IJ SOLN
INTRAMUSCULAR | Status: AC
  Filled 2018-12-31: qty 1

## 2018-12-31 MED ORDER — PROMETHAZINE HCL 25 MG/ML IJ SOLN: 6.2500 mg | INTRAMUSCULAR | Status: DC | PRN

## 2018-12-31 MED ORDER — SODIUM CHLORIDE 0.9 % IV SOLN
INTRAVENOUS | Status: DC | PRN
  Administered 2018-12-31: 08:00:00 25 ug/min via INTRAVENOUS

## 2018-12-31 MED ORDER — SODIUM CHLORIDE 0.9 % IV SOLN: 250.0000 mL | INTRAVENOUS | Status: DC | PRN

## 2018-12-31 MED ORDER — EPINEPHRINE PF 1 MG/ML IJ SOLN
INTRAMUSCULAR | Status: AC
  Filled 2018-12-31: qty 1

## 2018-12-31 MED ORDER — FENTANYL CITRATE (PF) 250 MCG/5ML IJ SOLN
INTRAMUSCULAR | Status: AC
  Filled 2018-12-31: qty 5

## 2018-12-31 MED ORDER — MEPERIDINE HCL 25 MG/ML IJ SOLN: 6.2500 mg | INTRAMUSCULAR | Status: DC | PRN

## 2018-12-31 MED ORDER — DEXAMETHASONE SODIUM PHOSPHATE 10 MG/ML IJ SOLN
INTRAMUSCULAR | Status: DC | PRN
  Administered 2018-12-31: 10 mg via INTRAVENOUS

## 2018-12-31 MED ORDER — ONDANSETRON HCL 4 MG/2ML IJ SOLN
INTRAMUSCULAR | Status: AC
  Filled 2018-12-31: qty 2

## 2018-12-31 MED ORDER — MUPIROCIN 2 % EX OINT
TOPICAL_OINTMENT | Freq: Once | CUTANEOUS | Status: AC
  Administered 2018-12-31: 06:00:00 1 via NASAL
  Filled 2018-12-31: qty 22

## 2018-12-31 MED ORDER — SUGAMMADEX SODIUM 200 MG/2ML IV SOLN
INTRAVENOUS | Status: DC | PRN
  Administered 2018-12-31: 350 mg via INTRAVENOUS

## 2018-12-31 MED ORDER — ACETAMINOPHEN 325 MG PO TABS: 650.0000 mg | ORAL_TABLET | ORAL | Status: DC | PRN

## 2018-12-31 MED ORDER — LACTATED RINGERS IV SOLN
INTRAVENOUS | Status: DC | PRN
  Administered 2018-12-31: 07:00:00 via INTRAVENOUS

## 2018-12-31 MED ORDER — ACETAMINOPHEN 650 MG RE SUPP: 650.0000 mg | RECTAL | Status: DC | PRN

## 2018-12-31 SURGICAL SUPPLY — 32 items
BLADE CLIPPER SURG (BLADE) ×1 IMPLANT
CANISTER SUCT 3000ML PPV (MISCELLANEOUS) ×3 IMPLANT
CONT SPEC 4OZ CLIKSEAL STRL BL (MISCELLANEOUS) ×1 IMPLANT
COVER BACK TABLE 60X90IN (DRAPES) ×3 IMPLANT
COVER WAND RF STERILE (DRAPES) ×1 IMPLANT
DRSG AQUACEL AG ADV 3.5X14 (GAUZE/BANDAGES/DRESSINGS) ×1 IMPLANT
FILTER STRAW FLUID ASPIR (MISCELLANEOUS) ×2 IMPLANT
FORCEPS BIOP RJ4 1.8 (CUTTING FORCEPS) ×2 IMPLANT
FORCEPS RADIAL JAW LRG 4 PULM (INSTRUMENTS) IMPLANT
GAUZE SPONGE 4X4 12PLY STRL (GAUZE/BANDAGES/DRESSINGS) ×3 IMPLANT
GLOVE BIO SURGEON STRL SZ7.5 (GLOVE) ×3 IMPLANT
GOWN STRL REUS W/ TWL XL LVL3 (GOWN DISPOSABLE) ×1 IMPLANT
GOWN STRL REUS W/TWL XL LVL3 (GOWN DISPOSABLE) ×3
KIT CLEAN ENDO COMPLIANCE (KITS) ×3 IMPLANT
KIT TURNOVER KIT B (KITS) ×3 IMPLANT
MARKER SKIN DUAL TIP RULER LAB (MISCELLANEOUS) ×3 IMPLANT
NS IRRIG 1000ML POUR BTL (IV SOLUTION) ×3 IMPLANT
OIL SILICONE PENTAX (PARTS (SERVICE/REPAIRS)) ×2 IMPLANT
PAD ARMBOARD 7.5X6 YLW CONV (MISCELLANEOUS) ×6 IMPLANT
RADIAL JAW LRG 4 PULMONARY (INSTRUMENTS) ×2
SOL ANTI FOG 6CC (MISCELLANEOUS) ×1 IMPLANT
SOLUTION ANTI FOG 6CC (MISCELLANEOUS) ×2
STOPCOCK MORSE 400PSI 3WAY (MISCELLANEOUS) ×1 IMPLANT
SYR 10ML LL (SYRINGE) ×3 IMPLANT
SYR 20ML ECCENTRIC (SYRINGE) ×3 IMPLANT
SYR 3ML LL SCALE MARK (SYRINGE) ×2 IMPLANT
TOWEL GREEN STERILE (TOWEL DISPOSABLE) ×1 IMPLANT
TOWEL GREEN STERILE FF (TOWEL DISPOSABLE) ×3 IMPLANT
TOWEL NATURAL 4PK STERILE (DISPOSABLE) ×1 IMPLANT
TRAP SPECIMEN MUCOUS 40CC (MISCELLANEOUS) ×1 IMPLANT
TUBE CONNECTING 20'X1/4 (TUBING) ×1
TUBE CONNECTING 20X1/4 (TUBING) ×2 IMPLANT

## 2018-12-31 NOTE — Anesthesia Procedure Notes (Signed)
Procedure Name: Intubation Date/Time: 12/31/2018 7:49 AM Performed by: Candis Shine, CRNA Pre-anesthesia Checklist: Patient identified, Emergency Drugs available, Suction available and Patient being monitored Patient Re-evaluated:Patient Re-evaluated prior to induction Oxygen Delivery Method: Circle System Utilized Preoxygenation: Pre-oxygenation with 100% oxygen Induction Type: IV induction Ventilation: Two handed mask ventilation required and Oral airway inserted - appropriate to patient size Laryngoscope Size: Glidescope and 4 Grade View: Grade I Tube type: Oral Tube size: 8.5 mm Number of attempts: 1 Airway Equipment and Method: Oral airway,  Video-laryngoscopy and Rigid stylet Placement Confirmation: ETT inserted through vocal cords under direct vision,  positive ETCO2 and breath sounds checked- equal and bilateral Secured at: 23 cm Tube secured with: Tape Dental Injury: Teeth and Oropharynx as per pre-operative assessment  Difficulty Due To: Difficulty was anticipated Future Recommendations: Recommend- induction with short-acting agent, and alternative techniques readily available Comments: Elective glidescope intubation d/t previously documented difficult intubation with subsequent glidescope use.

## 2018-12-31 NOTE — Anesthesia Preprocedure Evaluation (Signed)
Anesthesia Evaluation  Patient identified by MRN, date of birth, ID band Patient awake    Reviewed: Allergy & Precautions, NPO status , Patient's Chart, lab work & pertinent test results, reviewed documented beta blocker date and time   History of Anesthesia Complications Negative for: history of anesthetic complications  Airway Mallampati: II  TM Distance: <3 FB Neck ROM: Full    Dental  (+) Dental Advisory Given, Missing, Chipped   Pulmonary COPD,  COPD inhaler, former smoker,  12/28/2018 novel coronavirus NEG 12/26/2018 CXR: Perihilar bronchial valves, resolution of emphysema   breath sounds clear to auscultation       Cardiovascular hypertension, Pt. on medications + CAD, + CABG and + Peripheral Vascular Disease (AAA)   Rhythm:Regular Rate:Normal  09/2018 TEE: EF 55%, valves OK   Neuro/Psych negative neurological ROS     GI/Hepatic Neg liver ROS, GERD  Controlled,  Endo/Other  diabetes (diet controlled, glu 104)  Renal/GU negative Renal ROS     Musculoskeletal  (+) Arthritis ,   Abdominal   Peds  Hematology negative hematology ROS (+)   Anesthesia Other Findings   Reproductive/Obstetrics                             Anesthesia Physical Anesthesia Plan  ASA: III  Anesthesia Plan: General   Post-op Pain Management:    Induction: Intravenous  PONV Risk Score and Plan: 2 and Ondansetron and Dexamethasone  Airway Management Planned: Oral ETT and Video Laryngoscope Planned  Additional Equipment:   Intra-op Plan:   Post-operative Plan: Extubation in OR  Informed Consent: I have reviewed the patients History and Physical, chart, labs and discussed the procedure including the risks, benefits and alternatives for the proposed anesthesia with the patient or authorized representative who has indicated his/her understanding and acceptance.     Dental advisory given  Plan Discussed  with: CRNA and Surgeon  Anesthesia Plan Comments:         Anesthesia Quick Evaluation

## 2018-12-31 NOTE — H&P (Signed)
PCP is Hortencia Pilar, MD Referring Provider is No ref. provider found  No chief complaint on file.   HPI: Patient returns for follow-up and discussion.  He had redo CABG in late June.  He has significant COPD and had an air leak from the left lung with subcutaneous emphysema.  After conservative measures with chest tube did not help he underwent intrabronchial valve placement in the left upper lobe and lingula with resolution of the air leak and subcutaneous emphysema.  He has been discharged from the hospital doing well.  He has had no recurrent angina.  He has baseline dyspnea on exertion from his COPD but symptoms no active flareup, no productive cough wheezing.  He is now 8 weeks after the bronchial valve placement so the lung parenchyma should be healed and we will plan on removing the valves under general anesthesia with video bronchoscopy at [facility] on Monday, June 21.  I discussed the procedure of bronchoscopy and valve removal with the patient and his wife.  They understand it will be outpatient.  Past Medical History:  Diagnosis Date  . AAA (abdominal aortic aneurysm) ([place])   . Abdominal aortic aneurysm (AAA) ([place])   . Allergic rhinitis   . Anginal pain ([place])   . Centrilobular emphysema ([place])   . Cervical spine degeneration   . CHF (congestive heart failure) ([place])   . COPD (chronic obstructive pulmonary disease) ([place])   . Coronary artery disease   . Diabetes mellitus without complication ([place])   . Diverticulitis   . DJD (degenerative joint disease)   . Early cataract   . Eczema   . GERD (gastroesophageal reflux disease)   . H/O hiatal hernia    AGE 68 S  NO MEDS  . Hemorrhoids   . Herpes simplex infection   . Hypercholesterolemia   . Hyperglycemia   . Hyperlipidemia   . Hypertension   . Macular degeneration    right eye  . Obesity   . Shortness of breath    W/ EXERTION   . Wears glasses   . Wears partial dentures     Past Surgical History:  Procedure  Laterality Date  . ANTERIOR CERVICAL DECOMP/DISCECTOMY FUSION N/A 06/20/2013   Procedure: CERVCIAL FIVE-SIX,CERVICAL SIX-SEVEN ANTERIOR CERVICAL DECOMPRESSION WITH FUSION INTERBODY PROSTHESIS PLATING AND PEEK CAGE;  Surgeon: Ophelia Charter, MD;  Location: [place] NEURO ORS;  Service: Neurosurgery;  Laterality: N/A;  anterior  . CARDIAC CATHETERIZATION     [place]  2007   . CHEST TUBE INSERTION Left 10/15/2018   Procedure: INSERTION OF LEFT CHEST TUBE;  Surgeon: Ivin Poot, MD;  Location: [place];  Service: Thoracic;  Laterality: Left;  . COLONOSCOPY WITH PROPOFOL N/A 12/26/2017   Procedure: COLONOSCOPY WITH PROPOFOL;  Surgeon: Lollie Sails, MD;  Location[facility] ENDOSCOPY;  Service: Endoscopy;  Laterality: N/A;  . CORONARY ARTERY BYPASS GRAFT     2007  _0   . CORONARY ARTERY BYPASS GRAFT N/A 10/04/2018   Procedure: REDO CORONARY ARTERY BYPASS GRAFTING (CABG) x 1, SVG TO LAD, USING LEFT GREATER SAPHENOUS VEIN HARVESTED ENDOSCOPICALLY;  Surgeon: Ivin Poot, MD;  Location: [place];  Service: Open Heart Surgery;  Laterality: N/A;  . EYE SURGERY     RT EYE LASER (RET DETACHMENT)  . RIGHT/LEFT HEART CATH AND CORONARY/GRAFT ANGIOGRAPHY Left 09/05/2018   Procedure: RIGHT/LEFT HEART CATH AND CORONARY/GRAFT ANGIOGRAPHY;  Surgeon: Yolonda Kida, MD;  Location: [place] CV LAB;  Service: Cardiovascular;  Laterality: Left;  . TEE  WITHOUT CARDIOVERSION N/A 10/04/2018   Procedure: TRANSESOPHAGEAL ECHOCARDIOGRAM (TEE);  Surgeon: Prescott Gum, Collier Salina, MD;  Location: [place];  Service: Open Heart Surgery;  Laterality: N/A;  . TONSILLECTOMY    . VIDEO BRONCHOSCOPY WITH INSERTION OF INTERBRONCHIAL VALVE (IBV) N/A 10/26/2018   Procedure: VIDEO BRONCHOSCOPY WITH INSERTION OF INTERBRONCHIAL VALVES (IBV);  Surgeon: Prescott Gum, Collier Salina, MD;  Location: [place];  Service: Thoracic;  Laterality: N/A;    History reviewed. No pertinent family history.  Social History Social History   Tobacco Use  . Smoking  status: Former Smoker    Packs/day: 2.00    Years: 43.00    Pack years: 86.00    Types: Cigarettes    Quit date: 04/11/2004    Years since quitting: 14.7  . Smokeless tobacco: Never Used  Substance Use Topics  . Alcohol use: Yes    Alcohol/week: 3.0 - 4.0 standard drinks    Types: 3 - 4 Standard drinks or equivalent per week    Comment: occasional  . Drug use: No    No current facility-administered medications for this encounter.    Facility-Administered Medications Ordered in Other Encounters  Medication Dose Route Frequency Provider Last Rate Last Dose  . lactated ringers infusion    Continuous PRN Harder, Rebeca Alert, CRNA        Allergies  Allergen Reactions  . Atorvastatin Other (See Comments)    Leg cramps  . Sulfa Antibiotics Rash    Sun Rash     Review of Systems  No change in his symptoms of COPD No chest pain No fever Walking daily No edema No headache No difficulty swallowing or abdominal pain  BP 138/63   Pulse (!) 56   Temp 98.1 F (36.7 C) (Oral)   Resp 16   Ht _0  (1.753 m)   Wt 89.4 kg   SpO2 99%   BMI 29.09 kg/m  Physical Exam       Exam    General- alert and comfortable    Neck- no JVD, no cervical adenopathy palpable, no carotid bruit   Lungs- clear without rales, wheezes   Cor- regular rate and rhythm, no murmur , gallop   Abdomen- soft, non-tender   Extremities - warm, non-tender, minimal edema   Neuro- oriented, appropriate, no focal weakness  Diagnostic Tests: Chest x-ray reviewed.  No pneumothorax.  2 valves are present in the left upper lobe-anterior segment and lingular segment  Impression: Resolved air leak from left upper lobe.  It is time to remove the intrabronchial valves.  Plan: Surgery for removal of IBV from left upper lobe scheduled at [facility] September 21.   Len Childs, MD [facility] [phone]

## 2018-12-31 NOTE — Brief Op Note (Signed)
12/31/2018  8:41 AM  PATIENT:  Melvin Brewer  68 y.o. male  PRE-OPERATIVE DIAGNOSIS:  IBV INSERTION FOR SQ AIR S/P CABG  POST-OPERATIVE DIAGNOSIS:  IBV REMOVAL  PROCEDURE:  Procedure(s): VIDEO BRONCHOSCOPY WITH REMOVAL OF INTERBRONCHIAL VALVE (IBV) (N/A) left upper lobe x 2  SURGEON:  Surgeon(s) and Role:    Ivin Poot, MD - Primary  PHYSICIAN ASSISTANT: none  ASSISTANTS: none   ANESTHESIA:   general  EBL:  < 10 ml  BLOOD ADMINISTERED:none  DRAINS: none   LOCAL MEDICATIONS USED:  NONE  SPECIMEN:  Excision  DISPOSITION OF SPECIMEN:  N/A  COUNTS:  YES  TOURNIQUET:  * No tourniquets in log *  DICTATION: .Dragon Dictation  PLAN OF CARE: Discharge to home after PACU  PATIENT DISPOSITION:  PACU - hemodynamically stable.   Delay start of Pharmacological VTE agent (>24hrs) due to surgical blood loss or risk of bleeding: yes

## 2018-12-31 NOTE — Anesthesia Postprocedure Evaluation (Signed)
Anesthesia Post Note  Patient: Melvin Brewer  Procedure(s) Performed: VIDEO BRONCHOSCOPY WITH REMOVAL OF INTERBRONCHIAL VALVE (IBV) (N/A )     Patient location during evaluation: PACU Anesthesia Type: General Level of consciousness: awake and alert, patient cooperative and oriented Pain management: pain level controlled Vital Signs Assessment: post-procedure vital signs reviewed and stable Respiratory status: spontaneous breathing, nonlabored ventilation and respiratory function stable Cardiovascular status: blood pressure returned to baseline and stable Postop Assessment: no apparent nausea or vomiting Anesthetic complications: no    Last Vitals:  Vitals:   12/31/18 0920 12/31/18 0935  BP: (!) 118/51 124/76  Pulse: 62 82  Resp: 15 19  Temp: (!) 36.4 C   SpO2: 99% 98%    Last Pain:  Vitals:   12/31/18 0920  TempSrc:   PainSc: 0-No pain                 Ahna Konkle,E. Alethia Melendrez

## 2018-12-31 NOTE — Transfer of Care (Signed)
Immediate Anesthesia Transfer of Care Note  Patient: Melvin Brewer  Procedure(s) Performed: VIDEO BRONCHOSCOPY WITH REMOVAL OF INTERBRONCHIAL VALVE (IBV) (N/A )  Patient Location: PACU  Anesthesia Type:General  Level of Consciousness: awake, alert  and oriented  Airway & Oxygen Therapy: Patient Spontanous Breathing and Patient connected to face mask oxygen  Post-op Assessment: Report given to RN and Post -op Vital signs reviewed and stable  Post vital signs: Reviewed and stable  Last Vitals:  Vitals Value Taken Time  BP 144/48 12/31/18 0834  Temp    Pulse 78 12/31/18 0837  Resp 18 12/31/18 0837  SpO2 97 % 12/31/18 0837  Vitals shown include unvalidated device data.  Last Pain:  Vitals:   12/31/18 0620  TempSrc: Oral      Patients Stated Pain Goal: 8 ([id number])  Complications: No apparent anesthesia complications

## 2018-12-31 NOTE — Progress Notes (Signed)
Pre Procedure note for inpatients:   Melvin Brewer has been scheduled for Procedure(s): VIDEO BRONCHOSCOPY WITH REMOVAL OF INTERBRONCHIAL VALVE (IBV) (N/A) today. The various methods of treatment have been discussed with the patient. After consideration of the risks, benefits and treatment options the patient has consented to the planned procedure.   The patient has been seen and labs reviewed. There are no changes in the patient's condition to prevent proceeding with the planned procedure today.  Recent labs:  Lab Results  Component Value Date   WBC 8.5 12/31/2018   HGB 14.5 12/31/2018   HCT 45.5 12/31/2018   PLT 189 12/31/2018   GLUCOSE 122 (H) 12/31/2018   ALT 57 (H) 12/31/2018   AST 32 12/31/2018   NA 140 12/31/2018   K 4.0 12/31/2018   CL 103 12/31/2018   CREATININE 1.17 12/31/2018   BUN 15 12/31/2018   CO2 26 12/31/2018   INR 1.0 12/31/2018   HGBA1C 6.2 (H) 10/01/2018    Len Childs, MD 12/31/2018 7:40 AM

## 2018-12-31 NOTE — Discharge Instructions (Addendum)
No driving for 24 hrs Resume regular diet at home You may have a productive cough with bloody sputum for a day Resume regular meds at home You will not need office followup for this procedure

## 2018-12-31 NOTE — Op Note (Signed)
NAME: Melvin Brewer, BENESH MEDICAL RECORD [id number] ACCOUNT [id number] DATE OF BIRTH:10/16/1950 FACILITY: [facility] LOCATION: [facility] PHYSICIAN:PETER VAN TRIGT III, MD  OPERATIVE REPORT  DATE OF PROCEDURE:  12/31/2018  OPERATION:  Video bronchoscopy, removal of endobronchial valves (IBV) of the left upper lobe x2.  SURGEON:  [name], MD  ANESTHESIA:  General.  PREOPERATIVE DIAGNOSIS:  History of redo CABG 10/2018 with prolonged air leak from a left upper lobe requiring placement of endobronchial valves for treatment of air leak.  POSTOPERATIVE DIAGNOSIS:  History of redo CABG 10/2018 with prolonged air leak from a left upper lobe requiring placement of endobronchial valves for treatment of air leak.  CLINICAL NOTE:  The patient is a 68 year old male with significant COPD who had IBV x2 placed in the left upper lobe for postoperative air leak after redo CABG.  He is now at 8 weeks to allow the lung to heal and he was brought to the operating room for  removal of the IVB valves.  He understands the nature of the procedure, the benefits and the alternatives as well as the risks.  He agrees to proceed.  DESCRIPTION OF PROCEDURE:  The patient was placed supine on the operating table.  After informed consent had been documented in preoperative holding and final questions addressed.  The patient was intubated and placed under general anesthesia.  A proper  time-out was performed.  Using the video bronchoscope, the 2 IBVs in the left upper lobe were identified.  Using the biopsy forceps, both IBV valves were removed in their entirety.  The area was irrigated with saline.  There was mild mucosal edema and minimal bleeding controlled  with topical epinephrine.  The airway was clear, the procedure was ended and the patient was extubated and returned to recovery room.  TN/NUANCE  D:12/31/2018 T:12/31/2018 JOB:[id number]

## 2019-01-01 ENCOUNTER — Encounter (HOSPITAL_COMMUNITY): Payer: Self-pay | Admitting: Cardiothoracic Surgery

## 2019-01-20 ENCOUNTER — Other Ambulatory Visit: Payer: Self-pay | Admitting: Physician Assistant

## 2019-01-21 ENCOUNTER — Ambulatory Visit: Payer: Medicare Other | Attending: Family | Admitting: Family

## 2019-01-21 ENCOUNTER — Other Ambulatory Visit: Payer: Self-pay

## 2019-01-21 ENCOUNTER — Encounter: Payer: Self-pay | Admitting: Family

## 2019-01-21 VITALS — BP 139/74 | HR 60 | Resp 18 | Ht 69.0 in | Wt 206.0 lb

## 2019-01-21 DIAGNOSIS — J449 Chronic obstructive pulmonary disease, unspecified: Secondary | ICD-10-CM

## 2019-01-21 DIAGNOSIS — I110 Hypertensive heart disease with heart failure: Secondary | ICD-10-CM | POA: Insufficient documentation

## 2019-01-21 DIAGNOSIS — Z87891 Personal history of nicotine dependence: Secondary | ICD-10-CM | POA: Insufficient documentation

## 2019-01-21 DIAGNOSIS — I5032 Chronic diastolic (congestive) heart failure: Secondary | ICD-10-CM | POA: Diagnosis not present

## 2019-01-21 DIAGNOSIS — Z7982 Long term (current) use of aspirin: Secondary | ICD-10-CM | POA: Insufficient documentation

## 2019-01-21 DIAGNOSIS — M1990 Unspecified osteoarthritis, unspecified site: Secondary | ICD-10-CM | POA: Insufficient documentation

## 2019-01-21 DIAGNOSIS — E1136 Type 2 diabetes mellitus with diabetic cataract: Secondary | ICD-10-CM | POA: Diagnosis not present

## 2019-01-21 DIAGNOSIS — I2582 Chronic total occlusion of coronary artery: Secondary | ICD-10-CM | POA: Insufficient documentation

## 2019-01-21 DIAGNOSIS — Z951 Presence of aortocoronary bypass graft: Secondary | ICD-10-CM | POA: Diagnosis not present

## 2019-01-21 DIAGNOSIS — I2510 Atherosclerotic heart disease of native coronary artery without angina pectoris: Secondary | ICD-10-CM | POA: Insufficient documentation

## 2019-01-21 DIAGNOSIS — G479 Sleep disorder, unspecified: Secondary | ICD-10-CM | POA: Diagnosis not present

## 2019-01-21 DIAGNOSIS — R05 Cough: Secondary | ICD-10-CM | POA: Diagnosis present

## 2019-01-21 DIAGNOSIS — I10 Essential (primary) hypertension: Secondary | ICD-10-CM

## 2019-01-21 DIAGNOSIS — Z79899 Other long term (current) drug therapy: Secondary | ICD-10-CM | POA: Insufficient documentation

## 2019-01-21 DIAGNOSIS — J441 Chronic obstructive pulmonary disease with (acute) exacerbation: Secondary | ICD-10-CM | POA: Insufficient documentation

## 2019-01-21 DIAGNOSIS — E119 Type 2 diabetes mellitus without complications: Secondary | ICD-10-CM

## 2019-01-21 DIAGNOSIS — E785 Hyperlipidemia, unspecified: Secondary | ICD-10-CM | POA: Diagnosis not present

## 2019-01-21 DIAGNOSIS — E7800 Pure hypercholesterolemia, unspecified: Secondary | ICD-10-CM | POA: Insufficient documentation

## 2019-01-21 NOTE — Patient Instructions (Signed)
Continue weighing daily and call for an overnight weight gain of > 2 pounds or a weekly weight gain of >5 pounds.  Sent in referral for cardiac rehab

## 2019-01-21 NOTE — Progress Notes (Signed)
Patient ID: Melvin Brewer, male    DOB: 11-03-1950, 68 y.o.   MRN: [id number]  HPI  Mr Fakhouri is a 68 y/o male with a history of CAD, DM, hyperlipidemia, HTN, COPD, GERD, former tobacco use and chronic heart failure.   Echo report from 08/29/2018 reviewed and showed an EF of 50% along with mild MR/TR.   Catheterization done 09/05/2018 showed:  Ost 1st Mrg lesion is 100% stenosed.  1st LPL lesion is 100% stenosed.  Dist LM to Ost LAD lesion is 100% stenosed.  Origin to Prox Graft lesion is 100% stenosed.  Dist Graft lesion is 50% stenosed.   Conclusion Right heart cath Mean wedge of 20 PA 45/21 mean of 30 Cardiac output of 7.2 Fick  Multivessel coronary disease including coronary bypass Preserved left ventricular function borderline ejection fraction around 50% Native vessels with flush occlusion of the LAD CTO but grafted by LIMA Circumflex large left dominant minor irregularities occlusion of OM1 and OM 2 but grafted RCA nondominant Grafts LIMA to LAD atretic and occluded SVG jump graft from OM1 to OM 2  ,50% mid lesion but patent Significant collaterals right to left Significant collaterals left to left Almost complete revascularization of the LAD through collaterals  Outpatient surgery 12/31/2018 to remove intrabronchial valves. Admitted 10/04/2018 for redo Sternotomy with CABG x 1 utilizing SVG to distal LAD.  He also underwent endoscopic harvest of greater saphenous vein from this left thigh. Needed left sided chest tub placed due to worsening pneumothorax. Developed COPD exacerbation and was treated with nebulizer, steroids and pulmonary toilet. Developed subq air into left arm which required return to the OR for repeat chest tube placement. Endobronchial valves placed on 10/26/2018. Discharged after 28 days.   He presents today for his follow-up visit with a chief complaint of fatigue and occasional cough. This is associated with difficulty sleeping. He states he  feels a lot better since having the intrabronchial valves removed and starting daliresp. He denies chest pain, leg swelling, shortness of breath, palpitations, and dizziness. He weighs himself every day. He takes his lasix about every 3 days. If he has a busy day and is going out he does not take his lasix. He does not add salt to his food, however he has been increasing in the amount of food he is eating recently.     Past Medical History:  Diagnosis Date  . AAA (abdominal aortic aneurysm) ([place])   . Abdominal aortic aneurysm (AAA) ([place])   . Allergic rhinitis   . Anginal pain ([place])   . Centrilobular emphysema ([place])   . Cervical spine degeneration   . CHF (congestive heart failure) ([place])   . COPD (chronic obstructive pulmonary disease) ([place])   . Coronary artery disease   . Diabetes mellitus without complication ([place])   . Diverticulitis   . DJD (degenerative joint disease)   . Early cataract   . Eczema   . GERD (gastroesophageal reflux disease)   . H/O hiatal hernia    AGE 27 S  NO MEDS  . Hemorrhoids   . Herpes simplex infection   . Hypercholesterolemia   . Hyperglycemia   . Hyperlipidemia   . Hypertension   . Macular degeneration    right eye  . Obesity   . Shortness of breath    W/ EXERTION   . Wears glasses   . Wears partial dentures    Past Surgical History:  Procedure Laterality Date  . ANTERIOR CERVICAL DECOMP/DISCECTOMY FUSION N/A 06/20/2013  Procedure: CERVCIAL FIVE-SIX,CERVICAL SIX-SEVEN ANTERIOR CERVICAL DECOMPRESSION WITH FUSION INTERBODY PROSTHESIS PLATING AND PEEK CAGE;  Surgeon: Ophelia Charter, MD;  Location: [facility];  Service: Neurosurgery;  Laterality: N/A;  anterior  . CARDIAC CATHETERIZATION     [place]  2007   . CHEST TUBE INSERTION Left 10/15/2018   Procedure: INSERTION OF LEFT CHEST TUBE;  Surgeon: Ivin Poot, MD;  Location: [facility];  Service: Thoracic;  Laterality: Left;  . COLONOSCOPY WITH PROPOFOL N/A 12/26/2017   Procedure: COLONOSCOPY  WITH PROPOFOL;  Surgeon: Lollie Sails, MD;  Location: [facility]COPY;  Service: Endoscopy;  Laterality: N/A;  . CORONARY ARTERY BYPASS GRAFT     2007  _0   . CORONARY ARTERY BYPASS GRAFT N/A 10/04/2018   Procedure: REDO CORONARY ARTERY BYPASS GRAFTING (CABG) x 1, SVG TO LAD, USING LEFT GREATER SAPHENOUS VEIN HARVESTED ENDOSCOPICALLY;  Surgeon: Ivin Poot, MD;  Location: [place];  Service: Open Heart Surgery;  Laterality: N/A;  . EYE SURGERY     RT EYE LASER (RET DETACHMENT)  . RIGHT/LEFT HEART CATH AND CORONARY/GRAFT ANGIOGRAPHY Left 09/05/2018   Procedure: RIGHT/LEFT HEART CATH AND CORONARY/GRAFT ANGIOGRAPHY;  Surgeon: Yolonda Kida, MD;  Location: [place] CV LAB;  Service: Cardiovascular;  Laterality: Left;  . TEE WITHOUT CARDIOVERSION N/A 10/04/2018   Procedure: TRANSESOPHAGEAL ECHOCARDIOGRAM (TEE);  Surgeon: Prescott Gum, Collier Salina, MD;  Location: [place];  Service: Open Heart Surgery;  Laterality: N/A;  . TONSILLECTOMY    . VIDEO BRONCHOSCOPY WITH INSERTION OF INTERBRONCHIAL VALVE (IBV) N/A 10/26/2018   Procedure: VIDEO BRONCHOSCOPY WITH INSERTION OF INTERBRONCHIAL VALVES (IBV);  Surgeon: Prescott Gum, Collier Salina, MD;  Location: [facility] [place];  Service: Thoracic;  Laterality: N/A;  . VIDEO BRONCHOSCOPY WITH INSERTION OF INTERBRONCHIAL VALVE (IBV) N/A 12/31/2018   Procedure: VIDEO BRONCHOSCOPY WITH REMOVAL OF INTERBRONCHIAL VALVE (IBV);  Surgeon: Prescott Gum, Collier Salina, MD;  Location: [place];  Service: Thoracic;  Laterality: N/A;   No family history on file. Social History   Tobacco Use  . Smoking status: Former Smoker    Packs/day: 2.00    Years: 43.00    Pack years: 86.00    Types: Cigarettes    Quit date: 04/11/2004    Years since quitting: 14.7  . Smokeless tobacco: Never Used  Substance Use Topics  . Alcohol use: Yes    Alcohol/week: 3.0 - 4.0 standard drinks    Types: 3 - 4 Standard drinks or equivalent per week    Comment: occasional   Allergies  Allergen Reactions  . Atorvastatin  Other (See Comments)    Leg cramps  . Sulfa Antibiotics Rash    Sun Rash    Prior to Admission medications   Medication Sig Start Date End Date Taking? Authorizing Provider  albuterol (PROVENTIL HFA;VENTOLIN HFA) 108 (90 BASE) MCG/ACT inhaler Inhale 2 puffs into the lungs every 6 (six) hours as needed for wheezing or shortness of breath.   Yes [provider]  albuterol (PROVENTIL) (2.5 MG/3ML) 0.083% nebulizer solution Inhale 2.5 mg into the lungs every 6 (six) hours as needed for wheezing or shortness of breath. 04/27/18  Yes [provider]  aspirin EC 81 MG tablet Take 81 mg by mouth every evening.    Yes [provider]  azelastine (ASTELIN) 0.1 % nasal spray Place 1 spray into both nostrils as needed. Use in each nostril as directed    Yes [provider]  cholecalciferol (VITAMIN D3) 25 MCG (1000 UT) tablet Take 1,000 Units by mouth  daily.   Yes [provider]  DENTA 5000 PLUS 1.1 % CREA dental cream Take 1 application by mouth at bedtime. Brush teeth for at least 1 min 05/09/17  Yes [provider]  fexofenadine (ALLEGRA) 180 MG tablet Take 180 mg by mouth daily. 05/27/18  Yes [provider]  fluticasone (FLONASE) 50 MCG/ACT nasal spray Place 2 sprays into both nostrils daily.    Yes [provider]  furosemide (LASIX) 40 MG tablet Take 1 tablet (40 mg total) by mouth daily. 11/01/18  Yes Barrett, Erin R, PA-C  losartan (COZAAR) 25 MG tablet Take 25 mg by mouth daily. 07/08/18  Yes [provider]  Melatonin 3 MG CAPS Take 3 mg by mouth at bedtime as needed (sleep).   Yes [provider]  metoprolol tartrate (LOPRESSOR) 25 MG tablet Take 1 tablet (25 mg total) by mouth 2 (two) times daily. 11/01/18  Yes Barrett, Erin R, PA-C  montelukast (SINGULAIR) 10 MG tablet Take 10 mg by mouth every evening. 07/02/18  Yes [provider]  potassium chloride 20 MEQ TBCR Take 20 mEq by mouth daily. 11/01/18   Yes Barrett, Erin R, PA-C  Roflumilast (DALIRESP) 250 MCG TABS Take 250 mcg by mouth every evening.   Yes [provider]  rosuvastatin (CRESTOR) 40 MG tablet Take 40 mg by mouth daily.   Yes [provider]  sildenafil (REVATIO) 20 MG tablet Take 20-100 mg by mouth daily as needed for erectile dysfunction. 12/07/17  Yes [provider]  SYMBICORT 160-4.5 MCG/ACT inhaler Inhale 2 puffs into the lungs 2 (two) times a day. 05/07/18  Yes [provider]  Tiotropium Bromide Monohydrate (SPIRIVA RESPIMAT) 2.5 MCG/ACT AERS Inhale 2 puffs into the lungs daily.  03/06/17  Yes [provider]  valACYclovir (VALTREX) 500 MG tablet Take 500 mg by mouth every evening. 06/13/18  Yes [provider]  chlorpheniramine-HYDROcodone (TUSSIONEX) 10-8 MG/5ML SUER Take 5 mLs by mouth every 12 (twelve) hours as needed for cough. Patient not taking: Reported on 12/27/2018 11/01/18   Barrett, Lodema Hong, PA-C  predniSONE (DELTASONE) 5 MG tablet Take 5 mg by mouth every evening.    [provider]    Review of Systems  Constitutional: Positive for fatigue. Negative for appetite change.  HENT: Negative for congestion, rhinorrhea and sore throat.   Eyes: Negative.   Respiratory: Positive for cough (occasional). Negative for chest tightness, shortness of breath and wheezing.   Cardiovascular: Negative for chest pain, palpitations and leg swelling.  Gastrointestinal: Negative for abdominal distention and abdominal pain.  Endocrine: Negative.   Genitourinary: Negative.   Musculoskeletal: Negative for arthralgias and back pain.  Skin: Negative.   Allergic/Immunologic: Negative.   Neurological: Negative for dizziness and light-headedness.  Hematological: Negative for adenopathy. Bruises/bleeds easily.  Psychiatric/Behavioral: Positive for sleep disturbance. Negative for dysphoric mood. The patient is not nervous/anxious.    Vitals:   01/21/19 0850  BP: 139/74   Pulse: 60  Resp: 18  SpO2: 96%   Filed Weights   01/21/19 0850  Weight: 206 lb (93.4 kg)   Lab Results  Component Value Date   CREATININE 1.17 12/31/2018   CREATININE 0.94 11/01/2018   CREATININE 0.96 10/31/2018    Physical Exam Vitals signs and nursing note reviewed.  Constitutional:      Appearance: He is well-developed.  HENT:     Head: Normocephalic and atraumatic.  Neck:     Musculoskeletal: Normal range of motion.     Vascular: No  JVD.  Cardiovascular:     Rate and Rhythm: Normal rate and regular rhythm.  Pulmonary:     Effort: Pulmonary effort is normal. No accessory muscle usage.     Breath sounds: No wheezing or rales.  Abdominal:     Palpations: Abdomen is soft.     Tenderness: There is no abdominal tenderness.  Musculoskeletal:     Right lower leg: He exhibits no tenderness. No edema.     Left lower leg: He exhibits no tenderness. No edema.  Skin:    General: Skin is warm and dry.  Neurological:     General: No focal deficit present.     Mental Status: He is alert and oriented to person, place, and time.  Psychiatric:        Mood and Affect: Mood normal.        Behavior: Behavior normal.    Assessment & Plan:  1: Chronic heart failure with preserved ejection fraction- - NYHA class II - euvolemic today - weighing daily and he was instructed to call for an overnight weight gain of >2 pounds or a weekly weight gain of >5 pounds - weight up 6 pounds from last visit 2 months ago - not adding salt and has been reading food labels; reviewed the importance of closely following a 2065m sodium diet - says that he doesn't take his furosemide on days that he has a lot to do and estimates that he takes it approximately every 3 days - saw cardiology (Clayborn Bigness 11/15/2018 - does drink 1 scotch a few times / week - BNP 08/23/2018 was 196.0  2: HTN- - BP looks good today - saw PCP ([facility]) 12/10/2018 - BMP 12/31/2018 reviewed and showed sodium 140, potassium 4.0,  creatinine 1.17 and GFR >60  3: DM- - A1c 10/01/2018 was 6.2% - glucose this morning was 114 at home  4: CABG-    - saw cardiothoracic surgeon (Lucianne LeiTright) 12/31/2018 - will start referral process for cardiac rehab  5: COPD- - quit smoking ~ 2007 - saw pulmonology (Raul Del 12/04/2018  Patient did not bring his medications nor a list. Each medication was verbally reviewed with the patient and he was encouraged to bring the bottles to every visit to confirm accuracy of list.  Return in 6 months or sooner for any questions/problems before then.

## 2019-01-23 ENCOUNTER — Other Ambulatory Visit: Payer: Self-pay

## 2019-01-23 DIAGNOSIS — I714 Abdominal aortic aneurysm, without rupture, unspecified: Secondary | ICD-10-CM

## 2019-01-28 ENCOUNTER — Ambulatory Visit: Payer: Medicare Other | Admitting: Surgery

## 2019-01-28 ENCOUNTER — Other Ambulatory Visit: Payer: Self-pay

## 2019-01-28 ENCOUNTER — Encounter: Payer: Self-pay | Admitting: *Deleted

## 2019-01-28 ENCOUNTER — Encounter: Payer: Medicare Other | Attending: Internal Medicine | Admitting: *Deleted

## 2019-01-28 ENCOUNTER — Ambulatory Visit (HOSPITAL_COMMUNITY)
Admission: RE | Admit: 2019-01-28 | Discharge: 2019-01-28 | Disposition: A | Discharge status: Home / Self Care | Payer: Medicare Other | Source: Ambulatory Visit | Attending: Surgery | Admitting: Surgery

## 2019-01-28 DIAGNOSIS — I714 Abdominal aortic aneurysm, without rupture, unspecified: Secondary | ICD-10-CM

## 2019-01-28 DIAGNOSIS — Z951 Presence of aortocoronary bypass graft: Secondary | ICD-10-CM | POA: Insufficient documentation

## 2019-01-28 NOTE — Progress Notes (Signed)
Virtual Orientation completed today   Has EP Eval and gym orientation scheduled    Documentation of diagnosis can be found [facility] 12/31/2018

## 2019-01-29 ENCOUNTER — Ambulatory Visit (INDEPENDENT_AMBULATORY_CARE_PROVIDER_SITE_OTHER): Payer: Medicare Other | Admitting: Surgery

## 2019-01-29 ENCOUNTER — Encounter: Payer: Self-pay | Admitting: Surgery

## 2019-01-29 DIAGNOSIS — I714 Abdominal aortic aneurysm, without rupture, unspecified: Secondary | ICD-10-CM

## 2019-01-29 NOTE — Progress Notes (Signed)
[facility] [place]  Patient name: Melvin Brewer MRN: [id number] DOB: 24-May-1950 Sex: male      Virtual Visit via Telephone Note   This visit type was conducted due to national recommendations for restrictions regarding the COVID-19 Pandemic (e.g. social distancing) in an effort to limit this patient's exposure and mitigate transmission in our community.  Due to his co-morbid illnesses, this patient is at least at moderate risk for complications without adequate follow up.  This format is felt to be most appropriate for this patient at this time.  The patient did not have access to video technology/had technical difficulties with video requiring transitioning to audio format only (telephone).  All issues noted in this document were discussed and addressed.  No physical exam could be performed with this format.  Patient Location: Home Provider Location: Office  REQUESTING PROVIDER:    Dr. Darcey Nora   REASON FOR APPOINTMENT:    AAA  HISTORY OF PRESENT ILLNESS:   Melvin MOLZAHN is a 68 y.o. male, who is referred for evaluation and management of an abdominal aortic aneurysm.  This was discovered approximately 5 years ago at [facility].  He is currently without back pain or abdominal pain.  Patient suffers from coronary artery disease.  He has undergone CABG in the past.  He recently had a bronchial valve in place which was discontinued about a month ago.  He suffers from COPD.  He is a diabetic.  He is a former smoker.  He is medically managed for hypertension and takes a statin for hypercholesterolemia.    The patient does not have symptoms concerning for COVID-19 infection (fever, chills, cough, or new shortness of breath).   PAST MEDICAL HISTORY    Past Medical History:  Diagnosis Date  . AAA (abdominal aortic aneurysm) ([place])   . Abdominal aortic aneurysm (AAA) ([place])   . Allergic rhinitis   . Anginal pain ([place])   . Centrilobular  emphysema ([place])   . Cervical spine degeneration   . CHF (congestive heart failure) ([place])   . COPD (chronic obstructive pulmonary disease) ([place])   . Coronary artery disease   . Diabetes mellitus without complication ([place])   . Diverticulitis   . DJD (degenerative joint disease)   . Early cataract   . Eczema   . GERD (gastroesophageal reflux disease)   . H/O hiatal hernia    AGE 68 S  NO MEDS  . Hemorrhoids   . Herpes simplex infection   . Hypercholesterolemia   . Hyperglycemia   . Hyperlipidemia   . Hypertension   . Macular degeneration    right eye  . Obesity   . Shortness of breath    W/ EXERTION   . Wears glasses   . Wears partial dentures      FAMILY HISTORY   History reviewed. No pertinent family history.  SOCIAL HISTORY:   Social History   Socioeconomic History  . Marital status: Married    Spouse name: Not on file  . Number of children: Not on file  . Years of education: Not on file  . Highest education level: Not on file  Occupational History  . Not on file  Social Needs  . Financial resource strain: Not hard at all  . Food insecurity    Worry: Never true    Inability: Never true  . Transportation needs    Medical: No  Non-medical: No  Tobacco Use  . Smoking status: Former Smoker    Packs/day: 2.00    Years: 43.00    Pack years: 86.00    Types: Cigarettes    Quit date: 04/11/2004    Years since quitting: 14.8  . Smokeless tobacco: Never Used  Substance and Sexual Activity  . Alcohol use: Yes    Alcohol/week: 3.0 - 4.0 standard drinks    Types: 3 - 4 Standard drinks or equivalent per week    Comment: occasional  . Drug use: No  . Sexual activity: Not Currently  Lifestyle  . Physical activity    Days per week: 7 days    Minutes per session: 60 min  . Stress: Not at all  Relationships  . Social Herbalist on phone: Three times a week    Gets together: Three times a week    Attends religious service: More than 4 times per year     Active member of club or organization: No    Attends meetings of clubs or organizations: Never    Relationship status: Married  . Intimate partner violence    Fear of current or ex partner: No    Emotionally abused: No    Physically abused: No    Forced sexual activity: No  Other Topics Concern  . Not on file  Social History Narrative  . Not on file    ALLERGIES:    Allergies  Allergen Reactions  . Atorvastatin Other (See Comments)    Leg cramps  . Sulfa Antibiotics Rash    Sun Rash     CURRENT MEDICATIONS:    Current Outpatient Medications  Medication Sig Dispense Refill  . albuterol (PROVENTIL HFA;VENTOLIN HFA) 108 (90 BASE) MCG/ACT inhaler Inhale 2 puffs into the lungs every 6 (six) hours as needed for wheezing or shortness of breath.    Marland Kitchen albuterol (PROVENTIL) (2.5 MG/3ML) 0.083% nebulizer solution Inhale 2.5 mg into the lungs every 6 (six) hours as needed for wheezing or shortness of breath.    Marland Kitchen aspirin EC 81 MG tablet Take 81 mg by mouth every evening.     Marland Kitchen azelastine (ASTELIN) 0.1 % nasal spray Place 1 spray into both nostrils as needed. Use in each nostril as directed     . cholecalciferol (VITAMIN D3) 25 MCG (1000 UT) tablet Take 1,000 Units by mouth daily.    . DENTA 5000 PLUS 1.1 % CREA dental cream Take 1 application by mouth at bedtime. Brush teeth for at least 1 min  5  . fexofenadine (ALLEGRA) 180 MG tablet Take 180 mg by mouth daily.    . fluticasone (FLONASE) 50 MCG/ACT nasal spray Place 2 sprays into both nostrils daily.     . furosemide (LASIX) 40 MG tablet Take 1 tablet (40 mg total) by mouth daily. 30 tablet 3  . losartan (COZAAR) 25 MG tablet Take 25 mg by mouth daily.    . Melatonin 3 MG CAPS Take 3 mg by mouth at bedtime as needed (sleep).    . metFORMIN (GLUCOPHAGE) 500 MG tablet Take 500 mg by mouth daily.    . metoprolol tartrate (LOPRESSOR) 25 MG tablet Take 1 tablet (25 mg total) by mouth 2 (two) times daily. 60 tablet 3  . montelukast  (SINGULAIR) 10 MG tablet Take 10 mg by mouth every evening.    . potassium chloride 20 MEQ TBCR Take 20 mEq by mouth daily. 30 tablet 3  . Roflumilast (DALIRESP) 250 MCG  TABS Take 250 mcg by mouth every evening.    . rosuvastatin (CRESTOR) 40 MG tablet Take 40 mg by mouth daily.    . sildenafil (REVATIO) 20 MG tablet Take 20-100 mg by mouth daily as needed for erectile dysfunction.    . SYMBICORT 160-4.5 MCG/ACT inhaler Inhale 2 puffs into the lungs 2 (two) times a day.    . Tiotropium Bromide Monohydrate (SPIRIVA RESPIMAT) 2.5 MCG/ACT AERS Inhale 2 puffs into the lungs daily.     . valACYclovir (VALTREX) 500 MG tablet Take 500 mg by mouth every evening.    . chlorpheniramine-HYDROcodone (TUSSIONEX) 10-8 MG/5ML SUER Take 5 mLs by mouth every 12 (twelve) hours as needed for cough. (Patient not taking: Reported on 12/27/2018) 140 mL 0  . predniSONE (DELTASONE) 5 MG tablet Take 5 mg by mouth every evening.     No current facility-administered medications for this visit.     REVIEW OF SYSTEMS:   Please see the history of present illness.     All other systems reviewed and are negative.   Recent Labs: 08/23/2018: B Natriuretic Peptide 196.0 10/05/2018: Magnesium 2.4 12/31/2018: ALT 57; BUN 15; Creatinine, Ser 1.17; Hemoglobin 14.5; Platelets 189; Potassium 4.0; Sodium 140   Recent Lipid Panel No results found for: CHOL, TRIG, HDL, CHOLHDL, LDLCALC, LDLDIRECT  Wt Readings from Last 3 Encounters:  01/21/19 206 lb (93.4 kg)  12/31/18 197 lb (89.4 kg)  12/26/18 201 lb (91.2 kg)     STUDIES:   I have reviewed his ultrasound study which shows a 4.8 cm abdominal aortic aneurysm  ASSESSMENT and PLAN   AAA: I discussed with the patient that since we do not have a CT scan confirming the dimensions of the ultrasound, I think the neck step is to obtain a CT angiogram to determine whether or not he is a endovascular candidate, and to confirm the exact size of his aneurysm.  Based on these results,  his operative plan can be made.  He will get the study within the next 3 months and follow-up afterwards.  The patient had pre-CABG Dopplers recently which show 1-39% carotid stenosis and nearly normal ABIs.   Time:   Today, I have spent 11 minutes with the patient with telehealth technology discussing the above problems.     Leia Alf, MD, FACS [facility] [phone] Pager [phone]

## 2019-01-31 ENCOUNTER — Other Ambulatory Visit: Payer: Self-pay

## 2019-01-31 ENCOUNTER — Encounter: Payer: Medicare Other | Admitting: *Deleted

## 2019-01-31 DIAGNOSIS — Z951 Presence of aortocoronary bypass graft: Secondary | ICD-10-CM | POA: Diagnosis not present

## 2019-01-31 NOTE — Patient Instructions (Signed)
Patient Instructions  Patient Details  Name: Melvin Brewer MRN: [id number] Date of Birth: 1950/11/06 Referring Provider:  Yolonda Kida, MD  Below are your personal goals for exercise, nutrition, and risk factors. Our goal is to help you stay on track towards obtaining and maintaining these goals. We will be discussing your progress on these goals with you throughout the program.  Initial Exercise Prescription: Initial Exercise Prescription - 01/31/19 1000      Date of Initial Exercise RX and Referring Provider   Date  01/31/19    Referring Provider  [facility]      Treadmill   MPH  2.5    Grade  1    Minutes  15    METs  3.25      Recumbant Bike   Level  2    RPM  60    Watts  38    Minutes  15    METs  3.3      NuStep   Level  3    SPM  80    Minutes  15    METs  3.3      Arm Ergometer   Level  1    Watts  53    RPM  30    Minutes  15    METs  3.3      T5 Nustep   Level  2    SPM  80    Minutes  15    METs  3.3      Biostep-RELP   Level  3    SPM  50    Minutes  15    METs  3      Prescription Details   Frequency (times per week)  3    Duration  Progress to 30 minutes of continuous aerobic without signs/symptoms of physical distress      Intensity   THRR 40-80% of Max Heartrate  98-132    Ratings of Perceived Exertion  11-13    Perceived Dyspnea  0-4      Resistance Training   Training Prescription  Yes    Weight  4 lb    Reps  10-15       Exercise Goals: Frequency: Be able to perform aerobic exercise two to three times per week in program working toward 2-5 days per week of home exercise.  Intensity: Work with a perceived exertion of 11 (fairly light) - 15 (hard) while following your exercise prescription.  We will make changes to your prescription with you as you progress through the program.   Duration: Be able to do 30 to 45 minutes of continuous aerobic exercise in addition to a 5 minute warm-up and a 5 minute cool-down  routine.   Nutrition Goals: Your personal nutrition goals will be established when you do your nutrition analysis with the dietician.  The following are general nutrition guidelines to follow: Cholesterol < 200mg /day Sodium < 1500mg /day Fiber: Men over 50 yrs - 30 grams per day  Personal Goals: Personal Goals and Risk Factors at Admission - 01/28/19 1219      Core Components/Risk Factors/Patient Goals on Admission    Weight Management  Yes;Weight Loss    Intervention  Weight Management: Develop a combined nutrition and exercise program designed to reach desired caloric intake, while maintaining appropriate intake of nutrient and fiber, sodium and fats, and appropriate energy expenditure required for the weight goal.;Weight Management: Provide education and appropriate resources to help participant work  on and attain dietary goals.;Weight Management/Obesity: Establish reasonable short term and long term weight goals.;Obesity: Provide education and appropriate resources to help participant work on and attain dietary goals.    Admit Weight  196 lb 9.6 oz (89.2 kg)    Goal Weight: Short Term  194 lb (88 kg)    Goal Weight: Long Term  185 lb (83.9 kg)    Expected Outcomes  Short Term: Continue to assess and modify interventions until short term weight is achieved;Long Term: Adherence to nutrition and physical activity/exercise program aimed toward attainment of established weight goal;Weight Loss: Understanding of general recommendations for a balanced deficit meal plan, which promotes 1-2 lb weight loss per week and includes a negative energy balance of [phone] kcal/d    Diabetes  Yes    Intervention  Provide education about signs/symptoms and action to take for hypo/hyperglycemia.;Provide education about proper nutrition, including hydration, and aerobic/resistive exercise prescription along with prescribed medications to achieve blood glucose in normal ranges: Fasting glucose 65-99 mg/dL     Expected Outcomes  Short Term: Participant verbalizes understanding of the signs/symptoms and immediate care of hyper/hypoglycemia, proper foot care and importance of medication, aerobic/resistive exercise and nutrition plan for blood glucose control.;Long Term: Attainment of HbA1C < 7%.    Hypertension  Yes    Intervention  Provide education on lifestyle modifcations including regular physical activity/exercise, weight management, moderate sodium restriction and increased consumption of fresh fruit, vegetables, and low fat dairy, alcohol moderation, and smoking cessation.;Monitor prescription use compliance.    Expected Outcomes  Short Term: Continued assessment and intervention until BP is < 140/70mm HG in hypertensive participants. < 130/53mm HG in hypertensive participants with diabetes, heart failure or chronic kidney disease.;Long Term: Maintenance of blood pressure at goal levels.    Lipids  Yes    Expected Outcomes  Short Term: Participant states understanding of desired cholesterol values and is compliant with medications prescribed. Participant is following exercise prescription and nutrition guidelines.;Long Term: Cholesterol controlled with medications as prescribed, with individualized exercise RX and with personalized nutrition plan. Value goals: LDL < 70mg , HDL > 40 mg.       Tobacco Use Initial Evaluation: Social History   Tobacco Use  Smoking Status Former Smoker  . Packs/day: 2.00  . Years: 43.00  . Pack years: 86.00  . Types: Cigarettes  . Quit date: 04/11/2004  . Years since quitting: 14.8  Smokeless Tobacco Never Used    Exercise Goals and Review: Exercise Goals    Row Name 01/31/19 1042             Exercise Goals   Increase Physical Activity  Yes       Intervention  Provide advice, education, support and counseling about physical activity/exercise needs.;Develop an individualized exercise prescription for aerobic and resistive training based on initial evaluation  findings, risk stratification, comorbidities and participant's personal goals.       Expected Outcomes  Short Term: Attend rehab on a regular basis to increase amount of physical activity.;Long Term: Add in home exercise to make exercise part of routine and to increase amount of physical activity.;Long Term: Exercising regularly at least 3-5 days a week.       Increase Strength and Stamina  Yes       Intervention  Provide advice, education, support and counseling about physical activity/exercise needs.       Expected Outcomes  Short Term: Increase workloads from initial exercise prescription for resistance, speed, and METs.;Short Term: Perform resistance training  exercises routinely during rehab and add in resistance training at home;Long Term: Improve cardiorespiratory fitness, muscular endurance and strength as measured by increased METs and functional capacity (6MWT)       Able to understand and use rate of perceived exertion (RPE) scale  Yes       Intervention  Provide education and explanation on how to use RPE scale       Expected Outcomes  Short Term: Able to use RPE daily in rehab to express subjective intensity level;Long Term:  Able to use RPE to guide intensity level when exercising independently       Able to understand and use Dyspnea scale  Yes       Intervention  Provide education and explanation on how to use Dyspnea scale       Expected Outcomes  Short Term: Able to use Dyspnea scale daily in rehab to express subjective sense of shortness of breath during exertion;Long Term: Able to use Dyspnea scale to guide intensity level when exercising independently       Knowledge and understanding of Target Heart Rate Range (THRR)  Yes       Intervention  Provide education and explanation of THRR including how the numbers were predicted and where they are located for reference       Expected Outcomes  Short Term: Able to state/look up THRR;Short Term: Able to use daily as guideline for intensity  in rehab;Long Term: Able to use THRR to govern intensity when exercising independently       Able to check pulse independently  Yes       Intervention  Provide education and demonstration on how to check pulse in carotid and radial arteries.;Review the importance of being able to check your own pulse for safety during independent exercise       Expected Outcomes  Short Term: Able to explain why pulse checking is important during independent exercise;Long Term: Able to check pulse independently and accurately       Understanding of Exercise Prescription  Yes       Intervention  Provide education, explanation, and written materials on patient's individual exercise prescription       Expected Outcomes  Short Term: Able to explain program exercise prescription;Long Term: Able to explain home exercise prescription to exercise independently          Copy of goals given to participant.

## 2019-01-31 NOTE — Progress Notes (Signed)
Cardiac Individual Treatment Plan  Patient Details  Name: WESSLEY EMERT MRN: [id number] Date of Birth: 02/23/1951 Referring Provider:     Cardiac Rehab from 01/31/2019 in [facility] Cardiac and Pulmonary Rehab  Referring Provider  [facility]      Initial Encounter Date:    Cardiac Rehab from 01/31/2019 in [facility] Cardiac and Pulmonary Rehab  Date  01/31/19      Visit Diagnosis: S/P CABG x 1  Patient's Home Medications on Admission:  Current Outpatient Medications:  .  albuterol (PROVENTIL HFA;VENTOLIN HFA) 108 (90 BASE) MCG/ACT inhaler, Inhale 2 puffs into the lungs every 6 (six) hours as needed for wheezing or shortness of breath., Disp: , Rfl:  .  albuterol (PROVENTIL) (2.5 MG/3ML) 0.083% nebulizer solution, Inhale 2.5 mg into the lungs every 6 (six) hours as needed for wheezing or shortness of breath., Disp: , Rfl:  .  aspirin EC 81 MG tablet, Take 81 mg by mouth every evening. , Disp: , Rfl:  .  azelastine (ASTELIN) 0.1 % nasal spray, Place 1 spray into both nostrils as needed. Use in each nostril as directed , Disp: , Rfl:  .  chlorpheniramine-HYDROcodone (TUSSIONEX) 10-8 MG/5ML SUER, Take 5 mLs by mouth every 12 (twelve) hours as needed for cough. (Patient not taking: Reported on 12/27/2018), Disp: 140 mL, Rfl: 0 .  cholecalciferol (VITAMIN D3) 25 MCG (1000 UT) tablet, Take 1,000 Units by mouth daily., Disp: , Rfl:  .  DENTA 5000 PLUS 1.1 % CREA dental cream, Take 1 application by mouth at bedtime. Brush teeth for at least 1 min, Disp: , Rfl: 5 .  fexofenadine (ALLEGRA) 180 MG tablet, Take 180 mg by mouth daily., Disp: , Rfl:  .  fluticasone (FLONASE) 50 MCG/ACT nasal spray, Place 2 sprays into both nostrils daily. , Disp: , Rfl:  .  furosemide (LASIX) 40 MG tablet, Take 1 tablet (40 mg total) by mouth daily., Disp: 30 tablet, Rfl: 3 .  losartan (COZAAR) 25 MG tablet, Take 25 mg by mouth daily., Disp: , Rfl:  .  Melatonin 3 MG CAPS, Take 3 mg by mouth at bedtime as needed (sleep).,  Disp: , Rfl:  .  metFORMIN (GLUCOPHAGE) 500 MG tablet, Take 500 mg by mouth daily., Disp: , Rfl:  .  metoprolol tartrate (LOPRESSOR) 25 MG tablet, Take 1 tablet (25 mg total) by mouth 2 (two) times daily., Disp: 60 tablet, Rfl: 3 .  montelukast (SINGULAIR) 10 MG tablet, Take 10 mg by mouth every evening., Disp: , Rfl:  .  potassium chloride 20 MEQ TBCR, Take 20 mEq by mouth daily., Disp: 30 tablet, Rfl: 3 .  predniSONE (DELTASONE) 5 MG tablet, Take 5 mg by mouth every evening., Disp: , Rfl:  .  Roflumilast (DALIRESP) 250 MCG TABS, Take 250 mcg by mouth every evening., Disp: , Rfl:  .  rosuvastatin (CRESTOR) 40 MG tablet, Take 40 mg by mouth daily., Disp: , Rfl:  .  sildenafil (REVATIO) 20 MG tablet, Take 20-100 mg by mouth daily as needed for erectile dysfunction., Disp: , Rfl:  .  SYMBICORT 160-4.5 MCG/ACT inhaler, Inhale 2 puffs into the lungs 2 (two) times a day., Disp: , Rfl:  .  Tiotropium Bromide Monohydrate (SPIRIVA RESPIMAT) 2.5 MCG/ACT AERS, Inhale 2 puffs into the lungs daily. , Disp: , Rfl:  .  valACYclovir (VALTREX) 500 MG tablet, Take 500 mg by mouth every evening., Disp: , Rfl:   Past Medical History: Past Medical History:  Diagnosis Date  . AAA (abdominal aortic  aneurysm) ([place])   . Abdominal aortic aneurysm (AAA) ([place])   . Allergic rhinitis   . Anginal pain ([place])   . Centrilobular emphysema ([place])   . Cervical spine degeneration   . CHF (congestive heart failure) ([place])   . COPD (chronic obstructive pulmonary disease) ([place])   . Coronary artery disease   . Diabetes mellitus without complication ([place])   . Diverticulitis   . DJD (degenerative joint disease)   . Early cataract   . Eczema   . GERD (gastroesophageal reflux disease)   . H/O hiatal hernia    AGE 68 S  NO MEDS  . Hemorrhoids   . Herpes simplex infection   . Hypercholesterolemia   . Hyperglycemia   . Hyperlipidemia   . Hypertension   . Macular degeneration    right eye  . Obesity   . Shortness of breath     W/ EXERTION   . Wears glasses   . Wears partial dentures     Tobacco Use: Social History   Tobacco Use  Smoking Status Former Smoker  . Packs/day: 2.00  . Years: 43.00  . Pack years: 86.00  . Types: Cigarettes  . Quit date: 04/11/2004  . Years since quitting: 14.8  Smokeless Tobacco Never Used    Labs: Recent Chemical engineer    Labs for ITP Cardiac and Pulmonary Rehab Latest Ref Rng & Units 10/04/2018 10/04/2018 10/04/2018 10/04/2018 10/05/2018   Hemoglobin A1c 4.8 - 5.6 % - - - - -   PHART 7.350 - 7.450 7.358 7.326(L) 7.337(L) 7.340(L) 7.376   PCO2ART 32.0 - 48.0 mmHg 45.0 45.9 43.5 42.7 42.2   HCO3 20.0 - 28.0 mmol/L 25.8 24.1 23.4 23.1 24.2   TCO2 22 - 32 mmol/L _0 -   ACIDBASEDEF 0.0 - 2.0 mmol/L - 2.0 3.0(H) 3.0(H) 0.3   O2SAT % 99.0 98.0 98.0 97.0 97.9       Exercise Target Goals: Exercise Program Goal: Individual exercise prescription set using results from initial 6 min walk test and THRR while considering  patient's activity barriers and safety.   Exercise Prescription Goal: Initial exercise prescription builds to 30-45 minutes a day of aerobic activity, 2-3 days per week.  Home exercise guidelines will be given to patient during program as part of exercise prescription that the participant will acknowledge.  Activity Barriers & Risk Stratification: Activity Barriers & Cardiac Risk Stratification - 01/28/19 1213      Activity Barriers & Cardiac Risk Stratification   Activity Barriers  Joint Problems   Joints will hurt sometinmes with exercise   Cardiac Risk Stratification  Moderate       6 Minute Walk:   Oxygen Initial Assessment: Oxygen Initial Assessment - 01/28/19 1215      Home Oxygen   Home Oxygen Device  None    Sleep Oxygen Prescription  None    Home Exercise Oxygen Prescription  None    Home at Rest Exercise Oxygen Prescription  None       Oxygen Re-Evaluation:   Oxygen Discharge (Final Oxygen Re-Evaluation):   Initial  Exercise Prescription: Initial Exercise Prescription - 01/31/19 1000      Date of Initial Exercise RX and Referring Provider   Date  01/31/19    Referring Provider  [facility]      Treadmill   MPH  2.5    Grade  1    Minutes  15    METs  3.25      Recumbant Bike  Level  2    RPM  60    Watts  38    Minutes  15    METs  3.3      NuStep   Level  3    SPM  80    Minutes  15    METs  3.3      Arm Ergometer   Level  1    Watts  53    RPM  30    Minutes  15    METs  3.3      T5 Nustep   Level  2    SPM  80    Minutes  15    METs  3.3      Biostep-RELP   Level  3    SPM  50    Minutes  15    METs  3      Prescription Details   Frequency (times per week)  3    Duration  Progress to 30 minutes of continuous aerobic without signs/symptoms of physical distress      Intensity   THRR 40-80% of Max Heartrate  98-132    Ratings of Perceived Exertion  11-13    Perceived Dyspnea  0-4      Resistance Training   Training Prescription  Yes    Weight  4 lb    Reps  10-15       Perform Capillary Blood Glucose checks as needed.  Exercise Prescription Changes: Exercise Prescription Changes    Row Name 01/31/19 1000             Response to Exercise   Blood Pressure (Admit)  132/64       Blood Pressure (Exercise)  176/88       Blood Pressure (Exit)  144/70       Heart Rate (Admit)  63 bpm       Heart Rate (Exercise)  89 bpm       Heart Rate (Exit)  74 bpm       Oxygen Saturation (Admit)  96 %       Oxygen Saturation (Exercise)  91 %       Oxygen Saturation (Exit)  95 %       Rating of Perceived Exertion (Exercise)  15       Perceived Dyspnea (Exercise)  3       Symptoms  none       Comments  hard to exercise with mask          Exercise Comments:   Exercise Goals and Review: Exercise Goals    Row Name 01/31/19 1042             Exercise Goals   Increase Physical Activity  Yes       Intervention  Provide advice, education, support and counseling  about physical activity/exercise needs.;Develop an individualized exercise prescription for aerobic and resistive training based on initial evaluation findings, risk stratification, comorbidities and participant's personal goals.       Expected Outcomes  Short Term: Attend rehab on a regular basis to increase amount of physical activity.;Long Term: Add in home exercise to make exercise part of routine and to increase amount of physical activity.;Long Term: Exercising regularly at least 3-5 days a week.       Increase Strength and Stamina  Yes       Intervention  Provide advice, education, support and counseling about physical activity/exercise needs.  Expected Outcomes  Short Term: Increase workloads from initial exercise prescription for resistance, speed, and METs.;Short Term: Perform resistance training exercises routinely during rehab and add in resistance training at home;Long Term: Improve cardiorespiratory fitness, muscular endurance and strength as measured by increased METs and functional capacity (6MWT)       Able to understand and use rate of perceived exertion (RPE) scale  Yes       Intervention  Provide education and explanation on how to use RPE scale       Expected Outcomes  Short Term: Able to use RPE daily in rehab to express subjective intensity level;Long Term:  Able to use RPE to guide intensity level when exercising independently       Able to understand and use Dyspnea scale  Yes       Intervention  Provide education and explanation on how to use Dyspnea scale       Expected Outcomes  Short Term: Able to use Dyspnea scale daily in rehab to express subjective sense of shortness of breath during exertion;Long Term: Able to use Dyspnea scale to guide intensity level when exercising independently       Knowledge and understanding of Target Heart Rate Range (THRR)  Yes       Intervention  Provide education and explanation of THRR including how the numbers were predicted and where  they are located for reference       Expected Outcomes  Short Term: Able to state/look up THRR;Short Term: Able to use daily as guideline for intensity in rehab;Long Term: Able to use THRR to govern intensity when exercising independently       Able to check pulse independently  Yes       Intervention  Provide education and demonstration on how to check pulse in carotid and radial arteries.;Review the importance of being able to check your own pulse for safety during independent exercise       Expected Outcomes  Short Term: Able to explain why pulse checking is important during independent exercise;Long Term: Able to check pulse independently and accurately       Understanding of Exercise Prescription  Yes       Intervention  Provide education, explanation, and written materials on patient's individual exercise prescription       Expected Outcomes  Short Term: Able to explain program exercise prescription;Long Term: Able to explain home exercise prescription to exercise independently          Exercise Goals Re-Evaluation :   Discharge Exercise Prescription (Final Exercise Prescription Changes): Exercise Prescription Changes - 01/31/19 1000      Response to Exercise   Blood Pressure (Admit)  132/64    Blood Pressure (Exercise)  176/88    Blood Pressure (Exit)  144/70    Heart Rate (Admit)  63 bpm    Heart Rate (Exercise)  89 bpm    Heart Rate (Exit)  74 bpm    Oxygen Saturation (Admit)  96 %    Oxygen Saturation (Exercise)  91 %    Oxygen Saturation (Exit)  95 %    Rating of Perceived Exertion (Exercise)  15    Perceived Dyspnea (Exercise)  3    Symptoms  none    Comments  hard to exercise with mask       Nutrition:  Target Goals: Understanding of nutrition guidelines, daily intake of sodium '1500mg'$ , cholesterol '200mg'$ , calories 30% from fat and 7% or less from saturated fats, daily to have 5  or more servings of fruits and vegetables.  Biometrics:    Nutrition Therapy Plan and  Nutrition Goals:   Nutrition Assessments: Nutrition Assessments - 01/29/19 1035      MEDFICTS Scores   Pre Score  12       Nutrition Goals Re-Evaluation:   Nutrition Goals Discharge (Final Nutrition Goals Re-Evaluation):   Psychosocial: Target Goals: Acknowledge presence or absence of significant depression and/or stress, maximize coping skills, provide positive support system. Participant is able to verbalize types and ability to use techniques and skills needed for reducing stress and depression.   Initial Review & Psychosocial Screening: Initial Psych Review & Screening - 01/28/19 1216      Initial Review   Current issues with  None Identified      Family Dynamics   Good Support System?  Yes   Wife of 56 years,  son lives in [place], church friends     Barriers   Psychosocial barriers to participate in program  There are no identifiable barriers or psychosocial needs.;The patient should benefit from training in stress management and relaxation.      Screening Interventions   Interventions  Encouraged to exercise    Expected Outcomes  Short Term goal: Utilizing psychosocial counselor, staff and physician to assist with identification of specific Stressors or current issues interfering with healing process. Setting desired goal for each stressor or current issue identified.;Long Term Goal: Stressors or current issues are controlled or eliminated.;Short Term goal: Identification and review with participant of any Quality of Life or Depression concerns found by scoring the questionnaire.;Long Term goal: The participant improves quality of Life and PHQ9 Scores as seen by post scores and/or verbalization of changes       Quality of Life Scores:  Quality of Life - 01/29/19 1034      Quality of Life   Select  Quality of Life      Quality of Life Scores   Health/Function Pre  23.26 %    Socioeconomic Pre  24.56 %    Psych/Spiritual Pre  28.78 %    Family Pre  30 %     GLOBAL Pre  25.62 %      Scores of 19 and below usually indicate a poorer quality of life in these areas.  A difference of  2-3 points is a clinically meaningful difference.  A difference of 2-3 points in the total score of the Quality of Life Index has been associated with significant improvement in overall quality of life, self-image, physical symptoms, and general health in studies assessing change in quality of life.  PHQ-9: Recent Review Flowsheet Data    Depression screen [facility] 2/9 01/31/2019 07/31/2017 06/19/2017 06/12/2017 05/15/2017   Decreased Interest 0 0 0 0 0   Down, Depressed, Hopeless 0 0 0 0 0   PHQ - 2 Score 0 0 0 0 0   Altered sleeping '1 1 1 '$ 0 -   Tired, decreased energy 0 0 1 0 -   Change in appetite 0 0 - 0 -   Feeling bad or failure about yourself  0 0 - 0 -   Trouble concentrating 0 0 - 0 -   Moving slowly or fidgety/restless 0 0 - 0 -   Suicidal thoughts 0 0 - 0 -   PHQ-9 Score '1 1 2 '$ 0 -   Difficult doing work/chores Not difficult at all Not difficult at all Not difficult at all Not difficult at all -  Interpretation of Total Score  Total Score Depression Severity:  1-4 = Minimal depression, 5-9 = Mild depression, 10-14 = Moderate depression, 15-19 = Moderately severe depression, 20-27 = Severe depression   Psychosocial Evaluation and Intervention: Psychosocial Evaluation - 01/28/19 1226      Psychosocial Evaluation & Interventions   Interventions  Encouraged to exercise with the program and follow exercise prescription    Comments  Millan has no barriers to participating in the program. He ahs attended the Pulmonary Rehab program and is ready to start Cardiac Rehab after CABG x 1.  He lives with his wife and they have been married 36 years. She has been his number one support person. His son lives in [place]; they talk on phone and do not see him too often because of the Covid crisis. He is ready to start the program after several set backs after his surgery.     Expected Outcomes  Mamadou will benefit from consistent exercise to achieve his stated goals.  The educational and psychoeducational components of this program will be helpful in understanding and managing his health in a more positive way.  Staff will follow..    Continue Psychosocial Services   Follow up required by staff       Psychosocial Re-Evaluation:   Psychosocial Discharge (Final Psychosocial Re-Evaluation):   Vocational Rehabilitation: Provide vocational rehab assistance to qualifying candidates.   Vocational Rehab Evaluation & Intervention: Vocational Rehab - 01/28/19 1226      Initial Vocational Rehab Evaluation & Intervention   Assessment shows need for Vocational Rehabilitation  No       Education: Education Goals: Education classes will be provided on a variety of topics geared toward better understanding of heart health and risk factor modification. Participant will state understanding/return demonstration of topics presented as noted by education test scores.  Learning Barriers/Preferences: Learning Barriers/Preferences - 01/28/19 1225      Learning Barriers/Preferences   Learning Barriers  None    Learning Preferences  None       Education Topics:  AED/CPR: - Group verbal and written instruction with the use of models to demonstrate the basic use of the AED with the basic ABC's of resuscitation.   Pulmonary Rehab from 08/09/2017 in [facility] Cardiac and Pulmonary Rehab  Date  06/09/17  Educator  [employer]  Instruction Review Code  1- Verbalizes Understanding      General Nutrition Guidelines/Fats and Fiber: -Group instruction provided by verbal, written material, models and posters to present the general guidelines for heart healthy nutrition. Gives an explanation and review of dietary fats and fiber.   Pulmonary Rehab from 08/09/2017 in [facility] Cardiac and Pulmonary Rehab  Date  07/03/17  Educator  CR  Instruction Review Code  5- Refused Teaching      Controlling  Sodium/Reading Food Labels: -Group verbal and written material supporting the discussion of sodium use in heart healthy nutrition. Review and explanation with models, verbal and written materials for utilization of the food label.   Pulmonary Rehab from 08/09/2017 in [facility] Cardiac and Pulmonary Rehab  Date  07/10/17  Educator  CR  Instruction Review Code  1- Verbalizes Understanding      Exercise Physiology & General Exercise Guidelines: - Group verbal and written instruction with models to review the exercise physiology of the cardiovascular system and associated critical values. Provides general exercise guidelines with specific guidelines to those with heart or lung disease.    Pulmonary Rehab from 08/09/2017 in [facility] Cardiac and Pulmonary Rehab  Date  07/19/17  Educator  jh  Instruction Review Code  1- Verbalizes Understanding      Aerobic Exercise & Resistance Training: - Gives group verbal and written instruction on the various components of exercise. Focuses on aerobic and resistive training programs and the benefits of this training and how to safely progress through these programs..   Pulmonary Rehab from 07/15/2016 in [facility] Cardiac and Pulmonary Rehab  Date  05/11/16  Educator  [facility]  Instruction Review Code (retired)  2- Statistician, Balance, Mind/Body Relaxation: Provides group verbal/written instruction on the benefits of flexibility and balance training, including mind/body exercise modes such as yoga, pilates and tai chi.  Demonstration and skill practice provided.   Pulmonary Rehab from 08/09/2017 in [facility] Cardiac and Pulmonary Rehab  Date  05/24/17  Educator  AS  Instruction Review Code  1- Verbalizes Understanding      Stress and Anxiety: - Provides group verbal and written instruction about the health risks of elevated stress and causes of high stress.  Discuss the correlation between heart/lung disease and anxiety and treatment options. Review  healthy ways to manage with stress and anxiety.   Pulmonary Rehab from 08/09/2017 in [facility] Cardiac and Pulmonary Rehab  Date  06/14/17  Educator  [facility]  Instruction Review Code  1- Verbalizes Understanding      Depression: - Provides group verbal and written instruction on the correlation between heart/lung disease and depressed mood, treatment options, and the stigmas associated with seeking treatment.   Pulmonary Rehab from 07/15/2016 in [facility] Cardiac and Pulmonary Rehab  Date  06/15/16  Educator  [facility]  Instruction Review Code (retired)  2- meets Designer, fashion/clothing & Physiology of the Heart: - Group verbal and written instruction and models provide basic cardiac anatomy and physiology, with the coronary electrical and arterial systems. Review of Valvular disease and Heart Failure   Pulmonary Rehab from 08/09/2017 in [facility] Cardiac and Pulmonary Rehab  Date  08/09/17  Educator  [facility]  Instruction Review Code  5- Refused Teaching [already had]      Cardiac Procedures: - Group verbal and written instruction to review commonly prescribed medications for heart disease. Reviews the medication, class of the drug, and side effects. Includes the steps to properly store meds and maintain the prescription regimen. (beta blockers and nitrates)   Cardiac Medications I: - Group verbal and written instruction to review commonly prescribed medications for heart disease. Reviews the medication, class of the drug, and side effects. Includes the steps to properly store meds and maintain the prescription regimen.   Pulmonary Rehab from 08/09/2017 in [facility] Cardiac and Pulmonary Rehab  Date  05/26/17  Educator  [facility]  Instruction Review Code  5- Refused Teaching      Cardiac Medications II: -Group verbal and written instruction to review commonly prescribed medications for heart disease. Reviews the medication, class of the drug, and side effects. (all other drug classes)   Pulmonary Rehab from 08/09/2017 in  [facility] Cardiac and Pulmonary Rehab  Date  07/21/17  Educator  [facility]  Instruction Review Code  1- Verbalizes Understanding       Go Sex-Intimacy & Heart Disease, Get SMART - Goal Setting: - Group verbal and written instruction through game format to discuss heart disease and the return to sexual intimacy. Provides group verbal and written material to discuss and apply goal setting through the application of the S.M.A.R.T. Method.   Other Matters of the  Heart: - Provides group verbal, written materials and models to describe Stable Angina and Peripheral Artery. Includes description of the disease process and treatment options available to the cardiac patient.   Pulmonary Rehab from 07/15/2016 in [facility] Cardiac and Pulmonary Rehab  Date  05/13/16  Educator  [employer] your Numbers]  Instruction Review Code (retired)  2- meets goals/outcomes      Exercise & Equipment Safety: - Individual verbal instruction and demonstration of equipment use and safety with use of the equipment.   Cardiac Rehab from 01/31/2019 in [facility] Cardiac and Pulmonary Rehab  Date  01/31/19  Educator  AS  Instruction Review Code  1- Verbalizes Understanding      Infection Prevention: - Provides verbal and written material to individual with discussion of infection control including proper hand washing and proper equipment cleaning during exercise session.   Cardiac Rehab from 01/31/2019 in [facility] Cardiac and Pulmonary Rehab  Date  01/31/19  Educator  AS  Instruction Review Code  1- Verbalizes Understanding      Falls Prevention: - Provides verbal and written material to individual with discussion of falls prevention and safety.   Cardiac Rehab from 01/31/2019 in [facility] Cardiac and Pulmonary Rehab  Date  01/31/19  Educator  AS  Instruction Review Code  1- Verbalizes Understanding      Diabetes: - Individual verbal and written instruction to review signs/symptoms of diabetes, desired ranges of glucose level  fasting, after meals and with exercise. Acknowledge that pre and post exercise glucose checks will be done for 3 sessions at entry of program.   Know Your Numbers and Risk Factors: -Group verbal and written instruction about important numbers in your health.  Discussion of what are risk factors and how they play a role in the disease process.  Review of Cholesterol, Blood Pressure, Diabetes, and BMI and the role they play in your overall health.   Pulmonary Rehab from 08/09/2017 in [facility]  Date  07/21/17  Educator  [facility]  Instruction Review Code  1- Verbalizes Understanding      Sleep Hygiene: -Provides group verbal and written instruction about how sleep can affect your health.  Define sleep hygiene, discuss sleep cycles and impact of sleep habits. Review good sleep hygiene tips.    Pulmonary Rehab from 08/09/2017 in [facility] Cardiac and Pulmonary Rehab  Date  07/26/17  Educator  [facility]  Instruction Review Code  1- Verbalizes Understanding      Other: -Provides group and verbal instruction on various topics (see comments)   Knowledge Questionnaire Score: Knowledge Questionnaire Score - 01/29/19 1035      Knowledge Questionnaire Score   Pre Score  25/26  Heart Rate question incorrect       Core Components/Risk Factors/Patient Goals at Admission: Personal Goals and Risk Factors at Admission - 01/28/19 1219      Core Components/Risk Factors/Patient Goals on Admission    Weight Management  Yes;Weight Loss    Intervention  Weight Management: Develop a combined nutrition and exercise program designed to reach desired caloric intake, while maintaining appropriate intake of nutrient and fiber, sodium and fats, and appropriate energy expenditure required for the weight goal.;Weight Management: Provide education and appropriate resources to help participant work on and attain dietary goals.;Weight Management/Obesity: Establish reasonable short term and long term weight  goals.;Obesity: Provide education and appropriate resources to help participant work on and attain dietary goals.    Admit Weight  196 lb 9.6 oz (89.2 kg)  Goal Weight: Short Term  194 lb (88 kg)    Goal Weight: Long Term  185 lb (83.9 kg)    Expected Outcomes  Short Term: Continue to assess and modify interventions until short term weight is achieved;Long Term: Adherence to nutrition and physical activity/exercise program aimed toward attainment of established weight goal;Weight Loss: Understanding of general recommendations for a balanced deficit meal plan, which promotes 1-2 lb weight loss per week and includes a negative energy balance of [phone] kcal/d    Diabetes  Yes    Intervention  Provide education about signs/symptoms and action to take for hypo/hyperglycemia.;Provide education about proper nutrition, including hydration, and aerobic/resistive exercise prescription along with prescribed medications to achieve blood glucose in normal ranges: Fasting glucose 65-99 mg/dL    Expected Outcomes  Short Term: Participant verbalizes understanding of the signs/symptoms and immediate care of hyper/hypoglycemia, proper foot care and importance of medication, aerobic/resistive exercise and nutrition plan for blood glucose control.;Long Term: Attainment of HbA1C < 7%.    Hypertension  Yes    Intervention  Provide education on lifestyle modifcations including regular physical activity/exercise, weight management, moderate sodium restriction and increased consumption of fresh fruit, vegetables, and low fat dairy, alcohol moderation, and smoking cessation.;Monitor prescription use compliance.    Expected Outcomes  Short Term: Continued assessment and intervention until BP is < 140/40m HG in hypertensive participants. < 130/850mHG in hypertensive participants with diabetes, heart failure or chronic kidney disease.;Long Term: Maintenance of blood pressure at goal levels.    Lipids  Yes    Expected Outcomes   Short Term: Participant states understanding of desired cholesterol values and is compliant with medications prescribed. Participant is following exercise prescription and nutrition guidelines.;Long Term: Cholesterol controlled with medications as prescribed, with individualized exercise RX and with personalized nutrition plan. Value goals: LDL < '70mg'$ , HDL > 40 mg.       Core Components/Risk Factors/Patient Goals Review:    Core Components/Risk Factors/Patient Goals at Discharge (Final Review):    ITP Comments: ITP Comments    Row Name 01/28/19 1205 01/31/19 1043         ITP Comments  Virtual Orientation completed today   Has EP Eval and gym orientation scheduled    Documentation of diagnosis can be found [facility] 12/31/2018  6 MWT completed ITP created and sent to Dr MiSabra Heck       Comments: initial ITP

## 2019-02-04 ENCOUNTER — Other Ambulatory Visit: Payer: Self-pay

## 2019-02-04 ENCOUNTER — Encounter: Payer: Medicare Other | Admitting: *Deleted

## 2019-02-04 DIAGNOSIS — Z951 Presence of aortocoronary bypass graft: Secondary | ICD-10-CM

## 2019-02-04 LAB — GLUCOSE, CAPILLARY
Glucose-Capillary: 82 mg/dL (ref 70–99)
Glucose-Capillary: 87 mg/dL (ref 70–99)
Glucose-Capillary: 98 mg/dL (ref 70–99)
Glucose-Capillary: 119 mg/dL — ABNORMAL HIGH (ref 70–99)

## 2019-02-04 NOTE — Progress Notes (Signed)
Daily Session Note  Patient Details  Name: Melvin Brewer MRN: [id number] Date of Birth: [birth year] Referring Provider:     Cardiac Rehab from 01/31/2019 in [facility] Cardiac and Pulmonary Rehab  Referring Provider  [facility]      Encounter Date: 02/04/2019  Check In: Session Check In - 02/04/19 1424      Check-In   Supervising physician immediately available to respond to emergencies  See telemetry face sheet for immediately available ER MD    Location  [facility]-Cardiac & Pulmonary Rehab    Staff Present  Renita Papa, RN Moises Blood, BS, ACSM CEP, Exercise Physiologist;Joseph Tessie Fass RCP,RRT,BSRT    Virtual Visit  No    Medication changes reported      No    Fall or balance concerns reported     No    Warm-up and Cool-down  Performed on first and last piece of equipment    Resistance Training Performed  Yes    VAD Patient?  No    PAD/SET Patient?  No      Pain Assessment   Currently in Pain?  No/denies          Social History   Tobacco Use  Smoking Status Former Smoker  . Packs/day: 2.00  . Years: 43.00  . Pack years: 86.00  . Types: Cigarettes  . Quit date: 04/11/2004  . Years since quitting: 14.8  Smokeless Tobacco Never Used    Goals Met:  Independence with exercise equipment Exercise tolerated well No report of cardiac concerns or symptoms Strength training completed today  Goals Unmet:  Not Applicable  Comments: First full day of exercise!  Patient was oriented to gym and equipment including functions, settings, policies, and procedures.  Patient's individual exercise prescription and treatment plan were reviewed.  All starting workloads were established based on the results of the 6 minute walk test done at initial orientation visit.  The plan for exercise progression was also introduced and progression will be customized based on patient's performance and goals.     Dr. Emily Filbert is Medical Director for [place] and  [facility].

## 2019-02-06 ENCOUNTER — Encounter: Payer: Medicare Other | Admitting: *Deleted

## 2019-02-06 ENCOUNTER — Other Ambulatory Visit: Payer: Self-pay

## 2019-02-06 DIAGNOSIS — Z951 Presence of aortocoronary bypass graft: Secondary | ICD-10-CM | POA: Diagnosis not present

## 2019-02-06 LAB — GLUCOSE, CAPILLARY
Glucose-Capillary: 105 mg/dL — ABNORMAL HIGH (ref 70–99)
Glucose-Capillary: 116 mg/dL — ABNORMAL HIGH (ref 70–99)

## 2019-02-06 NOTE — Progress Notes (Signed)
Daily Session Note  Patient Details  Name: Melvin Brewer MRN: [id number] Date of Birth: 08/05/1950 Referring Provider:     Cardiac Rehab from 01/31/2019 in [facility] Card[facility] Pulmonary Rehab  Referring Provider  [facility]      Encounter Date: 02/06/2019  Check In: Session Check In - 02/06/19 1427      Check-In   Supervising physician immediately available to respond to emergencies  See telemetry face sheet for immediately available ER MD    Location  [facility]-Cardiac & Pulmonary Rehab    Staff Present  Renita Papa, RN Vickki Hearing, BA, ACSM CEP, Exercise Physiologist;Joseph Tessie Fass RCP,RRT,BSRT    Virtual Visit  No    Medication changes reported      No    Fall or balance concerns reported     No    Warm-up and Cool-down  Performed on first and last piece of equipment    Resistance Training Performed  Yes    VAD Patient?  No    PAD/SET Patient?  No      Pain Assessment   Currently in Pain?  No/denies          Social History   Tobacco Use  Smoking Status Former Smoker  . Packs/day: 2.00  . Years: 43.00  . Pack years: 86.00  . Types: Cigarettes  . Quit date: 04/11/2004  . Years since quitting: 14.8  Smokeless Tobacco Never Used    Goals Met:  Independence with exercise equipment Exercise tolerated well No report of cardiac concerns or symptoms Strength training completed today  Goals Unmet:  Not Applicable  Comments: Pt able to follow exercise prescription today without complaint.  Will continue to monitor for progression.    Dr. Emily Filbert is Medical Director for [facility].

## 2019-02-07 ENCOUNTER — Encounter: Payer: Medicare Other | Admitting: *Deleted

## 2019-02-07 DIAGNOSIS — Z951 Presence of aortocoronary bypass graft: Secondary | ICD-10-CM | POA: Diagnosis not present

## 2019-02-07 LAB — GLUCOSE, CAPILLARY
Glucose-Capillary: 115 mg/dL — ABNORMAL HIGH (ref 70–99)
Glucose-Capillary: 124 mg/dL — ABNORMAL HIGH (ref 70–99)

## 2019-02-07 NOTE — Progress Notes (Signed)
Daily Session Note  Patient Details  Name: Melvin Brewer MRN: [id number] Date of Birth: 06/18/1950 Referring Provider:     Cardiac Rehab from 01/31/2019 in [facility] Cardiac and Pulmonary Rehab  Referring Provider  [facility]      Encounter Date: 02/07/2019  Check In: Session Check In - 02/07/19 1431      Check-In   Supervising physician immediately available to respond to emergencies  See telemetry face sheet for immediately available ER MD    Location  [facility]-Cardiac & Pulmonary Rehab    Staff Present  Renita Papa, RN BSN;Jessica Luan Pulling, MA, RCEP, CCRP, CCET;Joseph [place] RCP,RRT,BSRT    Virtual Visit  No    Medication changes reported      No    Fall or balance concerns reported     No    Warm-up and Cool-down  Performed on first and last piece of equipment    Resistance Training Performed  Yes    VAD Patient?  No    PAD/SET Patient?  No      Pain Assessment   Currently in Pain?  No/denies          Social History   Tobacco Use  Smoking Status Former Smoker  . Packs/day: 2.00  . Years: 43.00  . Pack years: 86.00  . Types: Cigarettes  . Quit date: 04/11/2004  . Years since quitting: 14.8  Smokeless Tobacco Never Used    Goals Met:  Independence with exercise equipment Exercise tolerated well No report of cardiac concerns or symptoms Strength training completed today  Goals Unmet:  Not Applicable  Comments: Pt able to follow exercise prescription today without complaint.  Will continue to monitor for progression.    Dr. Emily Filbert is Medical Director for [place] and [facility].

## 2019-02-11 ENCOUNTER — Encounter: Payer: Medicare Other | Attending: Internal Medicine | Admitting: *Deleted

## 2019-02-11 ENCOUNTER — Other Ambulatory Visit: Payer: Self-pay

## 2019-02-11 DIAGNOSIS — Z951 Presence of aortocoronary bypass graft: Secondary | ICD-10-CM | POA: Diagnosis not present

## 2019-02-11 NOTE — Progress Notes (Signed)
Cardiac Individual Treatment Plan  Patient Details  Name: Melvin Brewer MRN: [id number] Date of Birth: 02/23/1951 Referring Provider:     Cardiac Rehab from 01/31/2019 in [facility]iac and Pulmonary Rehab  Referring Provider  [facility]      Initial Encounter Date:    Cardiac Rehab from 01/31/2019 in [facility] Cardiac and Pulmonary Rehab  Date  01/31/19      Visit Diagnosis: S/P CABG x 1  Patient's Home Medications on Admission:  Current Outpatient Medications:  .  albuterol (PROVENTIL HFA;VENTOLIN HFA) 108 (90 BASE) MCG/ACT inhaler, Inhale 2 puffs into the lungs every 6 (six) hours as needed for wheezing or shortness of breath., Disp: , Rfl:  .  albuterol (PROVENTIL) (2.5 MG/3ML) 0.083% nebulizer solution, Inhale 2.5 mg into the lungs every 6 (six) hours as needed for wheezing or shortness of breath., Disp: , Rfl:  .  aspirin EC 81 MG tablet, Take 81 mg by mouth every evening. , Disp: , Rfl:  .  azelastine (ASTELIN) 0.1 % nasal spray, Place 1 spray into both nostrils as needed. Use in each nostril as directed , Disp: , Rfl:  .  chlorpheniramine-HYDROcodone (TUSSIONEX) 10-8 MG/5ML SUER, Take 5 mLs by mouth every 12 (twelve) hours as needed for cough. (Patient not taking: Reported on 12/27/2018), Disp: 140 mL, Rfl: 0 .  cholecalciferol (VITAMIN D3) 25 MCG (1000 UT) tablet, Take 1,000 Units by mouth daily., Disp: , Rfl:  .  DENTA 5000 PLUS 1.1 % CREA dental cream, Take 1 application by mouth at bedtime. Brush teeth for at least 1 min, Disp: , Rfl: 5 .  fexofenadine (ALLEGRA) 180 MG tablet, Take 180 mg by mouth daily., Disp: , Rfl:  .  fluticasone (FLONASE) 50 MCG/ACT nasal spray, Place 2 sprays into both nostrils daily. , Disp: , Rfl:  .  furosemide (LASIX) 40 MG tablet, Take 1 tablet (40 mg total) by mouth daily., Disp: 30 tablet, Rfl: 3 .  losartan (COZAAR) 25 MG tablet, Take 25 mg by mouth daily., Disp: , Rfl:  .  Melatonin 3 MG CAPS, Take 3 mg by mouth at bedtime as needed (sleep).,  Disp: , Rfl:  .  metFORMIN (GLUCOPHAGE) 500 MG tablet, Take 500 mg by mouth daily., Disp: , Rfl:  .  metoprolol tartrate (LOPRESSOR) 25 MG tablet, Take 1 tablet (25 mg total) by mouth 2 (two) times daily., Disp: 60 tablet, Rfl: 3 .  montelukast (SINGULAIR) 10 MG tablet, Take 10 mg by mouth every evening., Disp: , Rfl:  .  potassium chloride 20 MEQ TBCR, Take 20 mEq by mouth daily., Disp: 30 tablet, Rfl: 3 .  predniSONE (DELTASONE) 5 MG tablet, Take 5 mg by mouth every evening., Disp: , Rfl:  .  Roflumilast (DALIRESP) 250 MCG TABS, Take 250 mcg by mouth every evening., Disp: , Rfl:  .  rosuvastatin (CRESTOR) 40 MG tablet, Take 40 mg by mouth daily., Disp: , Rfl:  .  sildenafil (REVATIO) 20 MG tablet, Take 20-100 mg by mouth daily as needed for erectile dysfunction., Disp: , Rfl:  .  SYMBICORT 160-4.5 MCG/ACT inhaler, Inhale 2 puffs into the lungs 2 (two) times a day., Disp: , Rfl:  .  Tiotropium Bromide Monohydrate (SPIRIVA RESPIMAT) 2.5 MCG/ACT AERS, Inhale 2 puffs into the lungs daily. , Disp: , Rfl:  .  valACYclovir (VALTREX) 500 MG tablet, Take 500 mg by mouth every evening., Disp: , Rfl:   Past Medical History: Past Medical History:  Diagnosis Date  . AAA (abdominal aortic  aneurysm) ([place])   . Abdominal aortic aneurysm (AAA) ([place])   . Allergic rhinitis   . Anginal pain ([place])   . Centrilobular emphysema ([place])   . Cervical spine degeneration   . CHF (congestive heart failure) ([place])   . COPD (chronic obstructive pulmonary disease) ([place])   . Coronary artery disease   . Diabetes mellitus without complication ([place])   . Diverticulitis   . DJD (degenerative joint disease)   . Early cataract   . Eczema   . GERD (gastroesophageal reflux disease)   . H/O hiatal hernia    AGE 68 S  NO MEDS  . Hemorrhoids   . Herpes simplex infection   . Hypercholesterolemia   . Hyperglycemia   . Hyperlipidemia   . Hypertension   . Macular degeneration    right eye  . Obesity   . Shortness of breath     W/ EXERTION   . Wears glasses   . Wears partial dentures     Tobacco Use: Social History   Tobacco Use  Smoking Status Former Smoker  . Packs/day: 2.00  . Years: 43.00  . Pack years: 86.00  . Types: Cigarettes  . Quit date: 04/11/2004  . Years since quitting: 14.8  Smokeless Tobacco Never Used    Labs: Recent Chemical engineer    Labs for ITP Cardiac and Pulmonary Rehab Latest Ref Rng & Units 10/04/2018 10/04/2018 10/04/2018 10/04/2018 10/05/2018   Hemoglobin A1c 4.8 - 5.6 % - - - - -   PHART 7.350 - 7.450 7.358 7.326(L) 7.337(L) 7.340(L) 7.376   PCO2ART 32.0 - 48.0 mmHg 45.0 45.9 43.5 42.7 42.2   HCO3 20.0 - 28.0 mmol/L 25.8 24.1 23.4 23.1 24.2   TCO2 22 - 32 mmol/L _0 -   ACIDBASEDEF 0.0 - 2.0 mmol/L - 2.0 3.0(H) 3.0(H) 0.3   O2SAT % 99.0 98.0 98.0 97.0 97.9       Exercise Target Goals: Exercise Program Goal: Individual exercise prescription set using results from initial 6 min walk test and THRR while considering  patient's activity barriers and safety.   Exercise Prescription Goal: Initial exercise prescription builds to 30-45 minutes a day of aerobic activity, 2-3 days per week.  Home exercise guidelines will be given to patient during program as part of exercise prescription that the participant will acknowledge.  Activity Barriers & Risk Stratification: Activity Barriers & Cardiac Risk Stratification - 01/28/19 1213      Activity Barriers & Cardiac Risk Stratification   Activity Barriers  Joint Problems   Joints will hurt sometinmes with exercise   Cardiac Risk Stratification  Moderate       6 Minute Walk:   Oxygen Initial Assessment: Oxygen Initial Assessment - 01/28/19 1215      Home Oxygen   Home Oxygen Device  None    Sleep Oxygen Prescription  None    Home Exercise Oxygen Prescription  None    Home at Rest Exercise Oxygen Prescription  None       Oxygen Re-Evaluation:   Oxygen Discharge (Final Oxygen Re-Evaluation):   Initial  Exercise Prescription: Initial Exercise Prescription - 01/31/19 1000      Date of Initial Exercise RX and Referring Provider   Date  01/31/19    Referring Provider  [facility]      Treadmill   MPH  2.5    Grade  1    Minutes  15    METs  3.25      Recumbant Bike  Level  2    RPM  60    Watts  38    Minutes  15    METs  3.3      NuStep   Level  3    SPM  80    Minutes  15    METs  3.3      Arm Ergometer   Level  1    Watts  53    RPM  30    Minutes  15    METs  3.3      T5 Nustep   Level  2    SPM  80    Minutes  15    METs  3.3      Biostep-RELP   Level  3    SPM  50    Minutes  15    METs  3      Prescription Details   Frequency (times per week)  3    Duration  Progress to 30 minutes of continuous aerobic without signs/symptoms of physical distress      Intensity   THRR 40-80% of Max Heartrate  98-132    Ratings of Perceived Exertion  11-13    Perceived Dyspnea  0-4      Resistance Training   Training Prescription  Yes    Weight  4 lb    Reps  10-15       Perform Capillary Blood Glucose checks as needed.  Exercise Prescription Changes: Exercise Prescription Changes    Row Name 01/31/19 1000             Response to Exercise   Blood Pressure (Admit)  132/64       Blood Pressure (Exercise)  176/88       Blood Pressure (Exit)  144/70       Heart Rate (Admit)  63 bpm       Heart Rate (Exercise)  89 bpm       Heart Rate (Exit)  74 bpm       Oxygen Saturation (Admit)  96 %       Oxygen Saturation (Exercise)  91 %       Oxygen Saturation (Exit)  95 %       Rating of Perceived Exertion (Exercise)  15       Perceived Dyspnea (Exercise)  3       Symptoms  none       Comments  hard to exercise with mask          Exercise Comments:   Exercise Goals and Review: Exercise Goals    Row Name 01/31/19 1042             Exercise Goals   Increase Physical Activity  Yes       Intervention  Provide advice, education, support and counseling  about physical activity/exercise needs.;Develop an individualized exercise prescription for aerobic and resistive training based on initial evaluation findings, risk stratification, comorbidities and participant's personal goals.       Expected Outcomes  Short Term: Attend rehab on a regular basis to increase amount of physical activity.;Long Term: Add in home exercise to make exercise part of routine and to increase amount of physical activity.;Long Term: Exercising regularly at least 3-5 days a week.       Increase Strength and Stamina  Yes       Intervention  Provide advice, education, support and counseling about physical activity/exercise needs.  Expected Outcomes  Short Term: Increase workloads from initial exercise prescription for resistance, speed, and METs.;Short Term: Perform resistance training exercises routinely during rehab and add in resistance training at home;Long Term: Improve cardiorespiratory fitness, muscular endurance and strength as measured by increased METs and functional capacity (6MWT)       Able to understand and use rate of perceived exertion (RPE) scale  Yes       Intervention  Provide education and explanation on how to use RPE scale       Expected Outcomes  Short Term: Able to use RPE daily in rehab to express subjective intensity level;Long Term:  Able to use RPE to guide intensity level when exercising independently       Able to understand and use Dyspnea scale  Yes       Intervention  Provide education and explanation on how to use Dyspnea scale       Expected Outcomes  Short Term: Able to use Dyspnea scale daily in rehab to express subjective sense of shortness of breath during exertion;Long Term: Able to use Dyspnea scale to guide intensity level when exercising independently       Knowledge and understanding of Target Heart Rate Range (THRR)  Yes       Intervention  Provide education and explanation of THRR including how the numbers were predicted and where  they are located for reference       Expected Outcomes  Short Term: Able to state/look up THRR;Short Term: Able to use daily as guideline for intensity in rehab;Long Term: Able to use THRR to govern intensity when exercising independently       Able to check pulse independently  Yes       Intervention  Provide education and demonstration on how to check pulse in carotid and radial arteries.;Review the importance of being able to check your own pulse for safety during independent exercise       Expected Outcomes  Short Term: Able to explain why pulse checking is important during independent exercise;Long Term: Able to check pulse independently and accurately       Understanding of Exercise Prescription  Yes       Intervention  Provide education, explanation, and written materials on patient's individual exercise prescription       Expected Outcomes  Short Term: Able to explain program exercise prescription;Long Term: Able to explain home exercise prescription to exercise independently          Exercise Goals Re-Evaluation : Exercise Goals Re-Evaluation    Row Name 02/04/19 1425             Exercise Goal Re-Evaluation   Exercise Goals Review  Increase Physical Activity;Able to understand and use rate of perceived exertion (RPE) scale;Knowledge and understanding of Target Heart Rate Range (THRR);Understanding of Exercise Prescription;Increase Strength and Stamina;Able to check pulse independently       Comments  Reviewed RPE scale, THR and program prescription with pt today.  Pt voiced understanding and was given a copy of goals to take home.       Expected Outcomes  Short: Use RPE daily to regulate intensity. Long: Follow program prescription in THR.          Discharge Exercise Prescription (Final Exercise Prescription Changes): Exercise Prescription Changes - 01/31/19 1000      Response to Exercise   Blood Pressure (Admit)  132/64    Blood Pressure (Exercise)  176/88    Blood Pressure  (Exit)  144/70  Heart Rate (Admit)  63 bpm    Heart Rate (Exercise)  89 bpm    Heart Rate (Exit)  74 bpm    Oxygen Saturation (Admit)  96 %    Oxygen Saturation (Exercise)  91 %    Oxygen Saturation (Exit)  95 %    Rating of Perceived Exertion (Exercise)  15    Perceived Dyspnea (Exercise)  3    Symptoms  none    Comments  hard to exercise with mask       Nutrition:  Target Goals: Understanding of nutrition guidelines, daily intake of sodium <1586m, cholesterol <2033m calories 30% from fat and 7% or less from saturated fats, daily to have 5 or more servings of fruits and vegetables.  Biometrics:    Nutrition Therapy Plan and Nutrition Goals:   Nutrition Assessments: Nutrition Assessments - 01/29/19 1035      MEDFICTS Scores   Pre Score  12       Nutrition Goals Re-Evaluation:   Nutrition Goals Discharge (Final Nutrition Goals Re-Evaluation):   Psychosocial: Target Goals: Acknowledge presence or absence of significant depression and/or stress, maximize coping skills, provide positive support system. Participant is able to verbalize types and ability to use techniques and skills needed for reducing stress and depression.   Initial Review & Psychosocial Screening: Initial Psych Review & Screening - 01/28/19 1216      Initial Review   Current issues with  None Identified      Family Dynamics   Good Support System?  Yes   Wife of [id number],  son lives in [place] friends     Barriers   Psychosocial barriers to participate in program  There are no identifiable barriers or psychosocial needs.;The patient should benefit from training in stress management and relaxation.      Screening Interventions   Interventions  Encouraged to exercise    Expected Outcomes  Short Term goal: Utilizing psychosocial counselor, staff and physician to assist with identification of specific Stressors or current issues interfering with healing process. Setting desired goal for  each stressor or current issue identified.;Long Term Goal: Stressors or current issues are controlled or eliminated.;Short Term goal: Identification and review with participant of any Quality of Life or Depression concerns found by scoring the questionnaire.;Long Term goal: The participant improves quality of Life and PHQ9 Scores as seen by post scores and/or verbalization of changes       Quality of Life Scores:  Quality of Life - 01/29/19 1034      Quality of Life   Select  Quality of Life      Quality of Life Scores   Health/Function Pre  23.26 %    Socioeconomic Pre  24.56 %    Psych/Spiritual Pre  28.78 %    Family Pre  30 %    GLOBAL Pre  25.62 %      Scores of 19 and below usually indicate a poorer quality of life in these areas.  A difference of  2-3 points is a clinically meaningful difference.  A difference of 2-3 points in the total score of the Quality of Life Index has been associated with significant improvement in overall quality of life, self-image, physical symptoms, and general health in studies assessing change in quality of life.  PHQ-9: Recent Review Flowsheet Data    [facility] 01/31/2019 07/31/2017 06/19/2017 06/12/2017 05/15/2017   Decreased Interest 0 0 0 0 0   Down, Depressed, Hopeless 0 0 0  0 0   PHQ - 2 Score 0 0 0 0 0   Altered sleeping _0 0 -   Tired, decreased energy 0 0 1 0 -   Change in appetite 0 0 - 0 -   Feeling bad or failure about yourself  0 0 - 0 -   Trouble concentrating 0 0 - 0 -   Moving slowly or fidgety/restless 0 0 - 0 -   Suicidal thoughts 0 0 - 0 -   PHQ-9 Score _1 0 -   Difficult doing work/chores Not difficult at all Not difficult at all Not difficult at all Not difficult at all -     Interpretation of Total Score  Total Score Depression Severity:  1-4 = Minimal depression, 5-9 = Mild depression, 10-14 = Moderate depression, 15-19 = Moderately severe depression, 20-27 = Severe depression   Psychosocial  Evaluation and Intervention: Psychosocial Evaluation - 01/28/19 1226      Psychosocial Evaluation & Interventions   Interventions  Encouraged to exercise with the program and follow exercise prescription    Comments  [place] has no barriers to participating in the program. He ahs attended the Pulmonary Rehab program and is ready to start Cardiac Rehab after CABG x 1.  He lives with his wife and they have been married 32 years. She has been his number one support person. His son lives in [place]; they talk on phone and do not see him too often because of the Covid crisis. He is ready to start the program after several set backs after his surgery.    Expected Outcomes  Teondre will benefit from consistent exercise to achieve his stated goals.  The educational and psychoeducational components of this program will be helpful in understanding and managing his health in a more positive way.  Staff will follow..    Continue Psychosocial Services   Follow up required by staff       Psychosocial Re-Evaluation:   Psychosocial Discharge (Final Psychosocial Re-Evaluation):   Vocational Rehabilitation: Provide vocational rehab assistance to qualifying candidates.   Vocational Rehab Evaluation & Intervention: Vocational Rehab - 01/28/19 1226      Initial Vocational Rehab Evaluation & Intervention   Assessment shows need for Vocational Rehabilitation  No       Education: Education Goals: Education classes will be provided on a variety of topics geared toward better understanding of heart health and risk factor modification. Participant will state understanding/return demonstration of topics presented as noted by education test scores.  Learning Barriers/Preferences: Learning Barriers/Preferences - 01/28/19 1225      Learning Barriers/Preferences   Learning Barriers  None    Learning Preferences  None       Education Topics:  AED/CPR: - Group verbal and written instruction with the use of  models to demonstrate the basic use of the AED with the basic ABC's of resuscitation.   Pulmonary Rehab from 08/09/2017 in [facility] Cardiac and Pulmonary Rehab  Date  06/09/17  Educator  [facility]  Instruction Review Code  1- Verbalizes Understanding      General Nutrition Guidelines/Fats and Fiber: -Group instruction provided by verbal, written material, models and posters to present the general guidelines for heart healthy nutrition. Gives an explanation and review of dietary fats and fiber.   Pulmonary Rehab from 08/09/2017 in [facility] Cardiac and Pulmonary Rehab  Date  07/03/17  Educator  CR  Instruction Review Code  5- Refused Teaching      Controlling  Sodium/Reading Food Labels: -Group verbal and written material supporting the discussion of sodium use in heart healthy nutrition. Review and explanation with models, verbal and written materials for utilization of the food label.   Pulmonary Rehab from 08/09/2017 in [facility] Cardiac and Pulmonary Rehab  Date  07/10/17  Educator  CR  Instruction Review Code  1- Verbalizes Understanding      Exercise Physiology & General Exercise Guidelines: - Group verbal and written instruction with models to review the exercise physiology of the cardiovascular system and associated critical values. Provides general exercise guidelines with specific guidelines to those with heart or lung disease.    Pulmonary Rehab from 08/09/2017 in [facility] Cardiac and Pulmonary Rehab  Date  07/19/17  Educator  jh  Instruction Review Code  1- Verbalizes Understanding      Aerobic Exercise & Resistance Training: - Gives group verbal and written instruction on the various components of exercise. Focuses on aerobic and resistive training programs and the benefits of this training and how to safely progress through these programs..   Pulmonary Rehab from 07/15/2016 in [facility] Cardiac and Pulmonary Rehab  Date  05/11/16  Educator  [facility]  Instruction Review Code (retired)  2- Engineer, materials, Balance, Mind/Body Relaxation: Provides group verbal/written instruction on the benefits of flexibility and balance training, including mind/body exercise modes such as yoga, pilates and tai chi.  Demonstration and skill practice provided.   Pulmonary Rehab from 08/09/2017 in [facility] Cardiac and Pulmonary Rehab  Date  05/24/17  Educator  AS  Instruction Review Code  1- Verbalizes Understanding      Stress and Anxiety: - Provides group verbal and written instruction about the health risks of elevated stress and causes of high stress.  Discuss the correlation between heart/lung disease and anxiety and treatment options. Review healthy ways to manage with stress and anxiety.   Pulmonary Rehab from 08/09/2017 in [facility] Cardiac and Pulmonary Rehab  Date  06/14/17  Educator  [facility]  Instruction Review Code  1- Verbalizes Understanding      Depression: - Provides group verbal and written instruction on the correlation between heart/lung disease and depressed mood, treatment options, and the stigmas associated with seeking treatment.   Pulmonary Rehab from 07/15/2016 in [facility] Cardiac and Pulmonary Rehab  Date  06/15/16  Educator  [facility]  Instruction Review Code (retired)  2- meets Designer, fashion/clothing & Physiology of the Heart: - Group verbal and written instruction and models provide basic cardiac anatomy and physiology, with the coronary electrical and arterial systems. Review of Valvular disease and Heart Failure   Pulmonary Rehab from 08/09/2017 in [facility] Cardiac and Pulmonary Rehab  Date  08/09/17  Educator  [facility]  Instruction Review Code  5- Refused Teaching [already had]      Cardiac Procedures: - Group verbal and written instruction to review commonly prescribed medications for heart disease. Reviews the medication, class of the drug, and side effects. Includes the steps to properly store meds and maintain the prescription regimen. (beta blockers and nitrates)   Cardiac  Medications I: - Group verbal and written instruction to review commonly prescribed medications for heart disease. Reviews the medication, class of the drug, and side effects. Includes the steps to properly store meds and maintain the prescription regimen.   Pulmonary Rehab from 08/09/2017 in [facility] Cardiac and Pulmonary Rehab  Date  05/26/17  Educator  [facility]  Instruction Review Code  5- Refused Teaching  Cardiac Medications II: -Group verbal and written instruction to review commonly prescribed medications for heart disease. Reviews the medication, class of the drug, and side effects. (all other drug classes)   Pulmonary Rehab from 08/09/2017 in [facility] Cardiac and Pulmonary Rehab  Date  07/21/17  Educator  [facility]  Instruction Review Code  1- Verbalizes Understanding       Go Sex-Intimacy & Heart Disease, Get SMART - Goal Setting: - Group verbal and written instruction through game format to discuss heart disease and the return to sexual intimacy. Provides group verbal and written material to discuss and apply goal setting through the application of the S.M.A.R.T. Method.   Other Matters of the Heart: - Provides group verbal, written materials and models to describe Stable Angina and Peripheral Artery. Includes description of the disease process and treatment options available to the cardiac patient.   Pulmonary Rehab from 07/15/2016 in [facility] Cardiac and Pulmonary Rehab  Date  05/13/16  Educator  [employer]]  Instruction Review Code (retired)  2- meets goals/outcomes      Exercise & Equipment Safety: - Individual verbal instruction and demonstration of equipment use and safety with use of the equipment.   Cardiac Rehab from 01/31/2019 in [facility] Cardiac and Pulmonary Rehab  Date  01/31/19  Educator  AS  Instruction Review Code  1- Verbalizes Understanding      Infection Prevention: - Provides verbal and written material to individual with discussion of infection control  including proper hand washing and proper equipment cleaning during exercise session.   Cardiac Rehab from 01/31/2019 in [facility] Cardiac and Pulmonary Rehab  Date  01/31/19  Educator  AS  Instruction Review Code  1- Verbalizes Understanding      Falls Prevention: - Provides verbal and written material to individual with discussion of falls prevention and safety.   Cardiac Rehab from 01/31/2019 in [facility] Cardiac and Pulmonary Rehab  Date  01/31/19  Educator  AS  Instruction Review Code  1- Verbalizes Understanding      Diabetes: - Individual verbal and written instruction to review signs/symptoms of diabetes, desired ranges of glucose level fasting, after meals and with exercise. Acknowledge that pre and post exercise glucose checks will be done for 3 sessions at entry of program.   Know Your Numbers and Risk Factors: -Group verbal and written instruction about important numbers in your health.  Discussion of what are risk factors and how they play a role in the disease process.  Review of Cholesterol, Blood Pressure, Diabetes, and BMI and the role they play in your overall health.   Pulmonary Rehab from 08/09/2017 in [facility] Cardiac and Pulmonary Rehab  Date  07/21/17  Educator  [facility]  Instruction Review Code  1- Verbalizes Understanding      Sleep Hygiene: -Provides group verbal and written instruction about how sleep can affect your health.  Define sleep hygiene, discuss sleep cycles and impact of sleep habits. Review good sleep hygiene tips.    Pulmonary Rehab from 08/09/2017 in [facility] Cardiac and Pulmonary Rehab  Date  07/26/17  Educator  [facility]  Instruction Review Code  1- Verbalizes Understanding      Other: -Provides group and verbal instruction on various topics (see comments)   Knowledge Questionnaire Score: Knowledge Questionnaire Score - 01/29/19 1035      Knowledge Questionnaire Score   Pre Score  25/26  Heart Rate question incorrect       Core Components/Risk Factors/Patient  Goals at Admission: Personal Goals and  Risk Factors at Admission - 01/28/19 1219      Core Components/Risk Factors/Patient Goals on Admission    Weight Management  Yes;Weight Loss    Intervention  Weight Management: Develop a combined nutrition and exercise program designed to reach desired caloric intake, while maintaining appropriate intake of nutrient and fiber, sodium and fats, and appropriate energy expenditure required for the weight goal.;Weight Management: Provide education and appropriate resources to help participant work on and attain dietary goals.;Weight Management/Obesity: Establish reasonable short term and long term weight goals.;Obesity: Provide education and appropriate resources to help participant work on and attain dietary goals.    Admit Weight  196 lb 9.6 oz (89.2 kg)    Goal Weight: Short Term  194 lb (88 kg)    Goal Weight: Long Term  185 lb (83.9 kg)    Expected Outcomes  Short Term: Continue to assess and modify interventions until short term weight is achieved;Long Term: Adherence to nutrition and physical activity/exercise program aimed toward attainment of established weight goal;Weight Loss: Understanding of general recommendations for a balanced deficit meal plan, which promotes 1-2 lb weight loss per week and includes a negative energy balance of [phone] kcal/d    Diabetes  Yes    Intervention  Provide education about signs/symptoms and action to take for hypo/hyperglycemia.;Provide education about proper nutrition, including hydration, and aerobic/resistive exercise prescription along with prescribed medications to achieve blood glucose in normal ranges: Fasting glucose 65-99 mg/dL    Expected Outcomes  Short Term: Participant verbalizes understanding of the signs/symptoms and immediate care of hyper/hypoglycemia, proper foot care and importance of medication, aerobic/resistive exercise and nutrition plan for blood glucose control.;Long Term: Attainment of HbA1C < 7%.     Hypertension  Yes    Intervention  Provide education on lifestyle modifcations including regular physical activity/exercise, weight management, moderate sodium restriction and increased consumption of fresh fruit, vegetables, and low fat dairy, alcohol moderation, and smoking cessation.;Monitor prescription use compliance.    Expected Outcomes  Short Term: Continued assessment and intervention until BP is < 140/57m HG in hypertensive participants. < 130/810mHG in hypertensive participants with diabetes, heart failure or chronic kidney disease.;Long Term: Maintenance of blood pressure at goal levels.    Lipids  Yes    Expected Outcomes  Short Term: Participant states understanding of desired cholesterol values and is compliant with medications prescribed. Participant is following exercise prescription and nutrition guidelines.;Long Term: Cholesterol controlled with medications as prescribed, with individualized exercise RX and with personalized nutrition plan. Value goals: LDL < 7075mHDL > 40 mg.       Core Components/Risk Factors/Patient Goals Review:    Core Components/Risk Factors/Patient Goals at Discharge (Final Review):    ITP Comments: ITP Comments    Row Name 01/28/19 1205 01/31/19 1043 02/04/19 1425 02/11/19 1544     ITP Comments  Virtual Orientation completed today   Has EP Eval and gym orientation scheduled    Documentation of diagnosis can be found [facility] 12/31/2018  6 MWT completed ITP created and sent to Dr MilSabra Heckirst full day of exercise!  Patient was oriented to gym and equipment including functions, settings, policies, and procedures.  Patient's individual exercise prescription and treatment plan were reviewed.  All starting workloads were established based on the results of the 6 minute walk test done at initial orientation visit.  The plan for exercise progression was also introduced and progression will be customized based on patient's performance and goals.  GerLemonraduated today from  rehab with 5 sessions completed.  He felt he could not exercise with a mask on and decided to graduate early from rehab. Details of the patient's exercise prescription and what He needs to do in order to continue the prescription and progress were discussed with patient.  Patient was given a copy of prescription and goals.  Patient verbalized understanding.  Aram plans to continue to exercise by walk at home.       Comments: Discharge ITP

## 2019-02-11 NOTE — Progress Notes (Signed)
Daily Session Note  Patient Details  Name: Melvin Brewer MRN: [id number] Date of Birth: [birth year] Referring Provider:     Cardiac Rehab from 01/31/2019 in [facility] Cardiac and Pulmonary Rehab  Referring Provider  [facility]      Encounter Date: 02/11/2019  Check In: Session Check In - 02/11/19 1433      Check-In   Supervising physician immediately available to respond to emergencies  See telemetry face sheet for immediately available ER MD    Location  [facility]-Cardiac & Pulmonary Rehab    Staff Present  Melvin Papa, RN Melvin Brewer, BS, ACSM CEP, Exercise Physiologist;Melvin Brewer RCP,RRT,BSRT    Virtual Visit  No    Medication changes reported      No    Fall or balance concerns reported     No    Warm-up and Cool-down  Performed on first and last piece of equipment    Resistance Training Performed  Yes    VAD Patient?  No    PAD/SET Patient?  No      Pain Assessment   Currently in Pain?  No/denies          Social History   Tobacco Use  Smoking Status Former Smoker  . Packs/day: 2.00  . Years: 43.00  . Pack years: 86.00  . Types: Cigarettes  . Quit date: 04/11/2004  . Years since quitting: 14.8  Smokeless Tobacco Never Used    Goals Met:  Independence with exercise equipment Exercise tolerated well No report of cardiac concerns or symptoms Strength training completed today  Goals Unmet:  Not Applicable  Comments:  Melvin Brewer graduated today from  rehab with 5 sessions completed.  Details of the patient's exercise prescription and what He needs to do in order to continue the prescription and progress were discussed with patient.  Patient was given a copy of prescription and goals.  Patient verbalized understanding.  Melvin Brewer plans to continue to exercise by walk at home.     Dr. Emily Brewer is Medical Director for [facility].

## 2019-02-11 NOTE — Progress Notes (Signed)
Discharge Progress Report  Patient Details  Name: Melvin Brewer MRN: [id number] Date of Birth: [birth year] Referring Provider:     Cardiac Rehab from 01/31/2019 in [facility] Cardiac and Pulmonary Rehab  Referring Provider  [facility]       Number of Visits: 5  Reason for Discharge:  Early Exit:  Personal.   Smoking History:  Social History   Tobacco Use  Smoking Status Former Smoker  . Packs/day: 2.00  . Years: 43.00  . Pack years: 86.00  . Types: Cigarettes  . Quit date: 04/11/2004  . Years since quitting: 14.8  Smokeless Tobacco Never Used    Diagnosis:  S/P CABG x 1  ADL UCSD:   Initial Exercise Prescription: Initial Exercise Prescription - 01/31/19 1000      Date of Initial Exercise RX and Referring Provider   Date  01/31/19    Referring Provider  [facility]      Treadmill   MPH  2.5    Grade  1    Minutes  15    METs  3.25      Recumbant Bike   Level  2    RPM  60    Watts  38    Minutes  15    METs  3.3      NuStep   Level  3    SPM  80    Minutes  15    METs  3.3      Arm Ergometer   Level  1    Watts  53    RPM  30    Minutes  15    METs  3.3      T5 Nustep   Level  2    SPM  80    Minutes  15    METs  3.3      Biostep-RELP   Level  3    SPM  50    Minutes  15    METs  3      Prescription Details   Frequency (times per week)  3    Duration  Progress to 30 minutes of continuous aerobic without signs/symptoms of physical distress      Intensity   THRR 40-80% of Max Heartrate  98-132    Ratings of Perceived Exertion  11-13    Perceived Dyspnea  0-4      Resistance Training   Training Prescription  Yes    Weight  4 lb    Reps  10-15       Discharge Exercise Prescription (Final Exercise Prescription Changes): Exercise Prescription Changes - 01/31/19 1000      Response to Exercise   Blood Pressure (Admit)  132/64    Blood Pressure (Exercise)  176/88    Blood Pressure (Exit)  144/70    Heart Rate (Admit)  63 bpm    Heart Rate (Exercise)  89 bpm    Heart Rate (Exit)  74 bpm    Oxygen Saturation (Admit)  96 %    Oxygen Saturation (Exercise)  91 %    Oxygen Saturation (Exit)  95 %    Rating of Perceived Exertion (Exercise)  15    Perceived Dyspnea (Exercise)  3    Symptoms  none    Comments  hard to exercise with mask       Functional Capacity:   Psychological, QOL, Others - Outcomes: PHQ 2/9: Depression screen [facility] 2/9 01/31/2019 07/31/2017 06/19/2017 06/12/2017 05/15/2017  Decreased Interest 0  0 0 0 0  Down, Depressed, Hopeless 0 0 0 0 0  PHQ - 2 Score 0 0 0 0 0  Altered sleeping 1 1 1  0 -  Tired, decreased energy 0 0 1 0 -  Change in appetite 0 0 - 0 -  Feeling bad or failure about yourself  0 0 - 0 -  Trouble concentrating 0 0 - 0 -  Moving slowly or fidgety/restless 0 0 - 0 -  Suicidal thoughts 0 0 - 0 -  PHQ-9 Score 1 1 2  0 -  Difficult doing work/chores Not difficult at all Not difficult at all Not difficult at all Not difficult at all -    Quality of Life: Quality of Life - 01/29/19 1034      Quality of Life   Select  Quality of Life      Quality of Life Scores   Health/Function Pre  23.26 %    Socioeconomic Pre  24.56 %    Psych/Spiritual Pre  28.78 %    Family Pre  30 %    GLOBAL Pre  25.62 %       Personal Goals: Goals established at orientation with interventions provided to work toward goal. Personal Goals and Risk Factors at Admission - 01/28/19 1219      Core Components/Risk Factors/Patient Goals on Admission    Weight Management  Yes;Weight Loss    Intervention  Weight Management: Develop a combined nutrition and exercise program designed to reach desired caloric intake, while maintaining appropriate intake of nutrient and fiber, sodium and fats, and appropriate energy expenditure required for the weight goal.;Weight Management: Provide education and appropriate resources to help participant work on and attain dietary goals.;Weight Management/Obesity: Establish  reasonable short term and long term weight goals.;Obesity: Provide education and appropriate resources to help participant work on and attain dietary goals.    Admit Weight  196 lb 9.6 oz (89.2 kg)    Goal Weight: Short Term  194 lb (88 kg)    Goal Weight: Long Term  185 lb (83.9 kg)    Expected Outcomes  Short Term: Continue to assess and modify interventions until short term weight is achieved;Long Term: Adherence to nutrition and physical activity/exercise program aimed toward attainment of established weight goal;Weight Loss: Understanding of general recommendations for a balanced deficit meal plan, which promotes 1-2 lb weight loss per week and includes a negative energy balance of [phone] kcal/d    Diabetes  Yes    Intervention  Provide education about signs/symptoms and action to take for hypo/hyperglycemia.;Provide education about proper nutrition, including hydration, and aerobic/resistive exercise prescription along with prescribed medications to achieve blood glucose in normal ranges: Fasting glucose 65-99 mg/dL    Expected Outcomes  Short Term: Participant verbalizes understanding of the signs/symptoms and immediate care of hyper/hypoglycemia, proper foot care and importance of medication, aerobic/resistive exercise and nutrition plan for blood glucose control.;Long Term: Attainment of HbA1C < 7%.    Hypertension  Yes    Intervention  Provide education on lifestyle modifcations including regular physical activity/exercise, weight management, moderate sodium restriction and increased consumption of fresh fruit, vegetables, and low fat dairy, alcohol moderation, and smoking cessation.;Monitor prescription use compliance.    Expected Outcomes  Short Term: Continued assessment and intervention until BP is < 140/17mm HG in hypertensive participants. < 130/89mm HG in hypertensive participants with diabetes, heart failure or chronic kidney disease.;Long Term: Maintenance of blood pressure at goal  levels.    Lipids  Yes  Expected Outcomes  Short Term: Participant states understanding of desired cholesterol values and is compliant with medications prescribed. Participant is following exercise prescription and nutrition guidelines.;Long Term: Cholesterol controlled with medications as prescribed, with individualized exercise RX and with personalized nutrition plan. Value goals: LDL < 70mg , HDL > 40 mg.        Personal Goals Discharge:   Exercise Goals and Review: Exercise Goals    Row Name 01/31/19 1042             Exercise Goals   Increase Physical Activity  Yes       Intervention  Provide advice, education, support and counseling about physical activity/exercise needs.;Develop an individualized exercise prescription for aerobic and resistive training based on initial evaluation findings, risk stratification, comorbidities and participant's personal goals.       Expected Outcomes  Short Term: Attend rehab on a regular basis to increase amount of physical activity.;Long Term: Add in home exercise to make exercise part of routine and to increase amount of physical activity.;Long Term: Exercising regularly at least 3-5 days a week.       Increase Strength and Stamina  Yes       Intervention  Provide advice, education, support and counseling about physical activity/exercise needs.       Expected Outcomes  Short Term: Increase workloads from initial exercise prescription for resistance, speed, and METs.;Short Term: Perform resistance training exercises routinely during rehab and add in resistance training at home;Long Term: Improve cardiorespiratory fitness, muscular endurance and strength as measured by increased METs and functional capacity (6MWT)       Able to understand and use rate of perceived exertion (RPE) scale  Yes       Intervention  Provide education and explanation on how to use RPE scale       Expected Outcomes  Short Term: Able to use RPE daily in rehab to express  subjective intensity level;Long Term:  Able to use RPE to guide intensity level when exercising independently       Able to understand and use Dyspnea scale  Yes       Intervention  Provide education and explanation on how to use Dyspnea scale       Expected Outcomes  Short Term: Able to use Dyspnea scale daily in rehab to express subjective sense of shortness of breath during exertion;Long Term: Able to use Dyspnea scale to guide intensity level when exercising independently       Knowledge and understanding of Target Heart Rate Range (THRR)  Yes       Intervention  Provide education and explanation of THRR including how the numbers were predicted and where they are located for reference       Expected Outcomes  Short Term: Able to state/look up THRR;Short Term: Able to use daily as guideline for intensity in rehab;Long Term: Able to use THRR to govern intensity when exercising independently       Able to check pulse independently  Yes       Intervention  Provide education and demonstration on how to check pulse in carotid and radial arteries.;Review the importance of being able to check your own pulse for safety during independent exercise       Expected Outcomes  Short Term: Able to explain why pulse checking is important during independent exercise;Long Term: Able to check pulse independently and accurately       Understanding of Exercise Prescription  Yes       Intervention  Provide education, explanation, and written materials on patient's individual exercise prescription       Expected Outcomes  Short Term: Able to explain program exercise prescription;Long Term: Able to explain home exercise prescription to exercise independently          Exercise Goals Re-Evaluation: Exercise Goals Re-Evaluation    Row Name 02/04/19 1425             Exercise Goal Re-Evaluation   Exercise Goals Review  Increase Physical Activity;Able to understand and use rate of perceived exertion (RPE)  scale;Knowledge and understanding of Target Heart Rate Range (THRR);Understanding of Exercise Prescription;Increase Strength and Stamina;Able to check pulse independently       Comments  Reviewed RPE scale, THR and program prescription with pt today.  Pt voiced understanding and was given a copy of goals to take home.       Expected Outcomes  Short: Use RPE daily to regulate intensity. Long: Follow program prescription in THR.          Nutrition & Weight - Outcomes:    Nutrition:   Nutrition Discharge: Nutrition Assessments - 01/29/19 1035      MEDFICTS Scores   Pre Score  12       Education Questionnaire Score: Knowledge Questionnaire Score - 01/29/19 1035      Knowledge Questionnaire Score   Pre Score  25/26  Heart Rate question incorrect       Goals reviewed with patient; copy given to patient.

## 2019-02-17 ENCOUNTER — Other Ambulatory Visit: Payer: Self-pay | Admitting: Physician Assistant

## 2019-02-28 ENCOUNTER — Other Ambulatory Visit: Payer: Self-pay | Admitting: Physician Assistant

## 2019-03-04 ENCOUNTER — Other Ambulatory Visit: Payer: Self-pay | Admitting: Physician Assistant

## 2019-04-01 ENCOUNTER — Other Ambulatory Visit: Payer: Self-pay | Admitting: Physician Assistant

## 2019-04-09 ENCOUNTER — Other Ambulatory Visit: Payer: Self-pay | Admitting: Surgery

## 2019-04-09 DIAGNOSIS — I714 Abdominal aortic aneurysm, without rupture, unspecified: Secondary | ICD-10-CM

## 2019-04-24 ENCOUNTER — Ambulatory Visit
Admission: RE | Admit: 2019-04-24 | Discharge: 2019-04-24 | Disposition: A | Discharge status: Home / Self Care | Payer: Medicare Other | Source: Ambulatory Visit | Attending: Surgery | Admitting: Surgery

## 2019-04-24 DIAGNOSIS — I714 Abdominal aortic aneurysm, without rupture, unspecified: Secondary | ICD-10-CM

## 2019-04-24 MED ORDER — IOPAMIDOL (ISOVUE-370) INJECTION 76%
75.0000 mL | Freq: Once | INTRAVENOUS | Status: AC | PRN
  Administered 2019-04-24: 75 mL via INTRAVENOUS

## 2019-04-26 ENCOUNTER — Telehealth (HOSPITAL_COMMUNITY): Payer: Self-pay

## 2019-04-26 NOTE — Telephone Encounter (Signed)

## 2019-04-29 ENCOUNTER — Encounter: Payer: Self-pay | Admitting: Surgery

## 2019-04-29 ENCOUNTER — Other Ambulatory Visit: Payer: Self-pay | Admitting: *Deleted

## 2019-04-29 ENCOUNTER — Encounter: Payer: Self-pay | Admitting: *Deleted

## 2019-04-29 ENCOUNTER — Other Ambulatory Visit: Payer: Self-pay

## 2019-04-29 ENCOUNTER — Ambulatory Visit (INDEPENDENT_AMBULATORY_CARE_PROVIDER_SITE_OTHER): Payer: Medicare Other | Admitting: Surgery

## 2019-04-29 VITALS — BP 130/76 | HR 58 | Temp 97.9°F | Resp 20 | Ht 69.0 in | Wt 206.6 lb

## 2019-04-29 DIAGNOSIS — I714 Abdominal aortic aneurysm, without rupture, unspecified: Secondary | ICD-10-CM

## 2019-04-29 NOTE — H&P (View-Only) (Signed)
[facility] [place]  Patient name: Melvin Brewer MRN: [id number] DOB: 1951/01/31 Sex: male   REASON FOR VISIT:    Follow up  HISOTRY OF PRESENT ILLNESS:    Melvin Brewer is a 69 y.o. male who is back today for follow-up of his abdominal aortic aneurysm.  This was first discovered approximately 5 years ago at [facility].  He is a symptomatic.  Specifically, he denies any back or abdominal pain  Patient suffers from coronary artery disease.  He has undergone CABG in the past.  He recently had a bronchial valve in place which was discontinued about a month ago.  He suffers from COPD.  He is a diabetic.  He is a former smoker.  He is medically managed for hypertension and takes a statin for hypercholesterolemia.   PAST MEDICAL HISTORY:   Past Medical History:  Diagnosis Date  . AAA (abdominal aortic aneurysm) ([place])   . Abdominal aortic aneurysm (AAA) ([place])   . Allergic rhinitis   . Anginal pain ([place])   . Centrilobular emphysema ([place])   . Cervical spine degeneration   . CHF (congestive heart failure) ([place])   . COPD (chronic obstructive pulmonary disease) ([place])   . Coronary artery disease   . Diabetes mellitus without complication ([place])   . Diverticulitis   . DJD (degenerative joint disease)   . Early cataract   . Eczema   . GERD (gastroesophageal reflux disease)   . H/O hiatal hernia    AGE 19 S  NO MEDS  . Hemorrhoids   . Herpes simplex infection   . Hypercholesterolemia   . Hyperglycemia   . Hyperlipidemia   . Hypertension   . Macular degeneration    right eye  . Obesity   . Shortness of breath    W/ EXERTION   . Wears glasses   . Wears partial dentures      FAMILY HISTORY:   History reviewed. No pertinent family history.  SOCIAL HISTORY:   Social History   Tobacco Use  . Smoking status: Former Smoker    Packs/day: 2.00    Years: 43.00    Pack years: 86.00    Types: Cigarettes    Quit  date: 04/11/2004    Years since quitting: 15.0  . Smokeless tobacco: Never Used  Substance Use Topics  . Alcohol use: Yes    Alcohol/week: 3.0 - 4.0 standard drinks    Types: 3 - 4 Standard drinks or equivalent per week    Comment: occasional     ALLERGIES:   Allergies  Allergen Reactions  . Atorvastatin Other (See Comments)    Leg cramps  . Sulfa Antibiotics Rash    Sun Rash      CURRENT MEDICATIONS:   Current Outpatient Medications  Medication Sig Dispense Refill  . albuterol (PROVENTIL HFA;VENTOLIN HFA) 108 (90 BASE) MCG/ACT inhaler Inhale 2 puffs into the lungs every 6 (six) hours as needed for wheezing or shortness of breath.    Marland Kitchen albuterol (PROVENTIL) (2.5 MG/3ML) 0.083% nebulizer solution Inhale 2.5 mg into the lungs every 6 (six) hours as needed for wheezing or shortness of breath.    Marland Kitchen aspirin EC 81 MG tablet Take 81 mg by mouth every evening.     Marland Kitchen azelastine (ASTELIN) 0.1 % nasal spray Place 1 spray into both nostrils as needed. Use in each nostril as directed     . cholecalciferol (VITAMIN D3) 25 MCG (1000 UT) tablet Take 1,000 Units by mouth  daily.    . DENTA 5000 PLUS 1.1 % CREA dental cream Take 1 application by mouth at bedtime. Brush teeth for at least 1 min  5  . fluticasone (FLONASE) 50 MCG/ACT nasal spray Place 2 sprays into both nostrils daily.     . furosemide (LASIX) 40 MG tablet TAKE 1 TABLET BY MOUTH EVERY DAY 90 tablet 1  . losartan (COZAAR) 25 MG tablet Take 25 mg by mouth daily.    . Melatonin 3 MG CAPS Take 3 mg by mouth at bedtime as needed (sleep).    . metFORMIN (GLUCOPHAGE) 500 MG tablet Take 500 mg by mouth daily.    . metoprolol tartrate (LOPRESSOR) 25 MG tablet Take 1 tablet (25 mg total) by mouth 2 (two) times daily. 60 tablet 3  . montelukast (SINGULAIR) 10 MG tablet Take 10 mg by mouth every evening.    . potassium chloride 20 MEQ TBCR Take 20 mEq by mouth daily. 30 tablet 3  . Roflumilast (DALIRESP) 250 MCG TABS Take 250 mcg by mouth every  evening.    . rosuvastatin (CRESTOR) 40 MG tablet Take 40 mg by mouth daily.    . sildenafil (REVATIO) 20 MG tablet Take 20-100 mg by mouth daily as needed for erectile dysfunction.    . SYMBICORT 160-4.5 MCG/ACT inhaler Inhale 2 puffs into the lungs 2 (two) times a day.    . Tiotropium Bromide Monohydrate (SPIRIVA RESPIMAT) 2.5 MCG/ACT AERS Inhale 2 puffs into the lungs daily.     . valACYclovir (VALTREX) 500 MG tablet Take 500 mg by mouth every evening.    . chlorpheniramine-HYDROcodone (TUSSIONEX) 10-8 MG/5ML SUER Take 5 mLs by mouth every 12 (twelve) hours as needed for cough. (Patient not taking: Reported on 12/27/2018) 140 mL 0   No current facility-administered medications for this visit.    REVIEW OF SYSTEMS:   [X]  denotes positive finding, [ ]  denotes negative finding Cardiac  Comments:  Chest pain or chest pressure:    Shortness of breath upon exertion:    Short of breath when lying flat:    Irregular heart rhythm:        Vascular    Pain in calf, thigh, or hip brought on by ambulation:    Pain in feet at night that wakes you up from your sleep:     Blood clot in your veins:    Leg swelling:         Pulmonary    Oxygen at home:    Productive cough:     Wheezing:         Neurologic    Sudden weakness in arms or legs:     Sudden numbness in arms or legs:     Sudden onset of difficulty speaking or slurred speech:    Temporary loss of vision in one eye:     Problems with dizziness:         Gastrointestinal    Blood in stool:     Vomited blood:         Genitourinary    Burning when urinating:     Blood in urine:        Psychiatric    Major depression:         Hematologic    Bleeding problems:    Problems with blood clotting too easily:        Skin    Rashes or ulcers:        Constitutional    Fever or chills:  PHYSICAL EXAM:   Vitals:   04/29/19 1116  BP: 130/76  Pulse: (!) 58  Resp: 20  Temp: 97.9 F (36.6 C)  SpO2: 98%  Weight: 206 lb  9.6 oz (93.7 kg)  Height: 5\' 9"  (1.753 m)    GENERAL: The patient is a well-nourished male, in no acute distress. The vital signs are documented above. CARDIAC: There is a regular rate and rhythm.  PULMONARY: Non-labored respirations ABDOMEN: Soft and non-tender with normal pitched bowel sounds.  MUSCULOSKELETAL: There are no major deformities or cyanosis. NEUROLOGIC: No focal weakness or paresthesias are detected. SKIN: There are no ulcers or rashes noted. PSYCHIATRIC: The patient has a normal affect.  STUDIES:   I have reviewed his CTA with the following findings: 1. Infrarenal abdominal aortic aneurysm measuring 5.1 cm in diameter with associated moderate amount of mural thrombus within the dominant component of the aneurysm. If intervention is not pursued, recommend follow-up CTA of the abdomen and pelvis in 3-6 months. This recommendation follows ACR consensus guidelines: White Paper of the ACR Incidental Findings Committee II on Vascular Findings. J Am Coll Radiol 2013; 10:789-794. 2. The infrarenal aortic aneurysm extends to asymmetrically involves the right common iliac artery which measures 2.5 cm in maximal diameter. 3. 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 4. Short-segment non flow limiting dissection involving the proximal aspect of the left internal iliac artery which appears mildly ectatic measuring 1.5 cm in diameter. 5. 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 6. Short-segment approximately 70% luminal narrowing involving the origin of the right superficial femoral artery with tandem areas of at least 50% luminal narrowing involving the proximal aspect of the left superficial femoral artery. Correlation for symptoms of PAD is advised. 7. Aortic aneurysm NOS (ICD10-I71.9). Aortic  Atherosclerosis (ICD10-I70.0).  NON-VASCULAR  1. Scattered colonic diverticulosis without evidence of superimposed acute diverticulitis. 2. Diffuse thickening the urinary bladder wall, potentially accentuated due to underdistention though could be seen in the setting of a cystitis or bladder outlet obstruction. Clinical correlation is advised. 3. Potential solitary punctate (2 mm) nonobstructing right-sided renal stone.   MEDICAL ISSUES:   AAA: We discussed the risks and benefits of proceeding with endovascular aneurysm repair.  At this time he would like to get this fixed.  Maximum aortic diameter is 5.1 cm.  We discussed the risks and benefits the operation including but not limited to the risk of bleeding, intestinal ischemia, renal insufficiency, cardiopulmonary complications, stroke, death, access site complications and distal embolization.  All his questions were answered.  He will be scheduled in the immediate future.    Leia Alf, MD, FACS [facility] [phone] Pager [phone]

## 2019-04-29 NOTE — Progress Notes (Signed)
[facility] and [facility]in Specialist of [place]  Patient name: Melvin Brewer MRN: [id number] DOB: November 14, 1950 Sex: male   REASON FOR VISIT:    Follow up  HISOTRY OF PRESENT ILLNESS:    Melvin Brewer is a 69 y.o. male who is back today for follow-up of his abdominal aortic aneurysm.  This was first discovered approximately 5 years ago at [facility].  He is a symptomatic.  Specifically, he denies any back or abdominal pain  Patient suffers from coronary artery disease.  He has undergone CABG in the past.  He recently had a bronchial valve in place which was discontinued about a month ago.  He suffers from COPD.  He is a diabetic.  He is a former smoker.  He is medically managed for hypertension and takes a statin for hypercholesterolemia.   PAST MEDICAL HISTORY:   Past Medical History:  Diagnosis Date  . AAA (abdominal aortic aneurysm) ([place])   . Abdominal aortic aneurysm (AAA) ([place])   . Allergic rhinitis   . Anginal pain ([place])   . Centrilobular emphysema ([place])   . Cervical spine degeneration   . CHF (congestive heart failure) ([place])   . COPD (chronic obstructive pulmonary disease) ([place])   . Coronary artery disease   . Diabetes mellitus without complication ([place])   . Diverticulitis   . DJD (degenerative joint disease)   . Early cataract   . Eczema   . GERD (gastroesophageal reflux disease)   . H/O hiatal hernia    AGE 75 S  NO MEDS  . Hemorrhoids   . Herpes simplex infection   . Hypercholesterolemia   . Hyperglycemia   . Hyperlipidemia   . Hypertension   . Macular degeneration    right eye  . Obesity   . Shortness of breath    W/ EXERTION   . Wears glasses   . Wears partial dentures      FAMILY HISTORY:   History reviewed. No pertinent family history.  SOCIAL HISTORY:   Social History   Tobacco Use  . Smoking status: Former Smoker    Packs/day: 2.00    Years: 43.00    Pack years: 86.00    Types: Cigarettes    Quit  date: 04/11/2004    Years since quitting: 15.0  . Smokeless tobacco: Never Used  Substance Use Topics  . Alcohol use: Yes    Alcohol/week: 3.0 - 4.0 standard drinks    Types: 3 - 4 Standard drinks or equivalent per week    Comment: occasional     ALLERGIES:   Allergies  Allergen Reactions  . Atorvastatin Other (See Comments)    Leg cramps  . Sulfa Antibiotics Rash    Sun Rash      CURRENT MEDICATIONS:   Current Outpatient Medications  Medication Sig Dispense Refill  . albuterol (PROVENTIL HFA;VENTOLIN HFA) 108 (90 BASE) MCG/ACT inhaler Inhale 2 puffs into the lungs every 6 (six) hours as needed for wheezing or shortness of breath.    Marland Kitchen albuterol (PROVENTIL) (2.5 MG/3ML) 0.083% nebulizer solution Inhale 2.5 mg into the lungs every 6 (six) hours as needed for wheezing or shortness of breath.    Marland Kitchen aspirin EC 81 MG tablet Take 81 mg by mouth every evening.     Marland Kitchen azelastine (ASTELIN) 0.1 % nasal spray Place 1 spray into both nostrils as needed. Use in each nostril as directed     . cholecalciferol (VITAMIN D3) 25 MCG (1000 UT) tablet Take 1,000 Units by mouth  daily.    . DENTA 5000 PLUS 1.1 % CREA dental cream Take 1 application by mouth at bedtime. Brush teeth for at least 1 min  5  . fluticasone (FLONASE) 50 MCG/ACT nasal spray Place 2 sprays into both nostrils daily.     . furosemide (LASIX) 40 MG tablet TAKE 1 TABLET BY MOUTH EVERY DAY 90 tablet 1  . losartan (COZAAR) 25 MG tablet Take 25 mg by mouth daily.    . Melatonin 3 MG CAPS Take 3 mg by mouth at bedtime as needed (sleep).    . metFORMIN (GLUCOPHAGE) 500 MG tablet Take 500 mg by mouth daily.    . metoprolol tartrate (LOPRESSOR) 25 MG tablet Take 1 tablet (25 mg total) by mouth 2 (two) times daily. 60 tablet 3  . montelukast (SINGULAIR) 10 MG tablet Take 10 mg by mouth every evening.    . potassium chloride 20 MEQ TBCR Take 20 mEq by mouth daily. 30 tablet 3  . Roflumilast (DALIRESP) 250 MCG TABS Take 250 mcg by mouth every  evening.    . rosuvastatin (CRESTOR) 40 MG tablet Take 40 mg by mouth daily.    . sildenafil (REVATIO) 20 MG tablet Take 20-100 mg by mouth daily as needed for erectile dysfunction.    . SYMBICORT 160-4.5 MCG/ACT inhaler Inhale 2 puffs into the lungs 2 (two) times a day.    . Tiotropium Bromide Monohydrate (SPIRIVA RESPIMAT) 2.5 MCG/ACT AERS Inhale 2 puffs into the lungs daily.     . valACYclovir (VALTREX) 500 MG tablet Take 500 mg by mouth every evening.    . chlorpheniramine-HYDROcodone (TUSSIONEX) 10-8 MG/5ML SUER Take 5 mLs by mouth every 12 (twelve) hours as needed for cough. (Patient not taking: Reported on 12/27/2018) 140 mL 0   No current facility-administered medications for this visit.    REVIEW OF SYSTEMS:   [X]  denotes positive finding, [ ]  denotes negative finding Cardiac  Comments:  Chest pain or chest pressure:    Shortness of breath upon exertion:    Short of breath when lying flat:    Irregular heart rhythm:        Vascular    Pain in calf, thigh, or hip brought on by ambulation:    Pain in feet at night that wakes you up from your sleep:     Blood clot in your veins:    Leg swelling:         Pulmonary    Oxygen at home:    Productive cough:     Wheezing:         Neurologic    Sudden weakness in arms or legs:     Sudden numbness in arms or legs:     Sudden onset of difficulty speaking or slurred speech:    Temporary loss of vision in one eye:     Problems with dizziness:         Gastrointestinal    Blood in stool:     Vomited blood:         Genitourinary    Burning when urinating:     Blood in urine:        Psychiatric    Major depression:         Hematologic    Bleeding problems:    Problems with blood clotting too easily:        Skin    Rashes or ulcers:        Constitutional    Fever or chills:  PHYSICAL EXAM:   Vitals:   04/29/19 1116  BP: 130/76  Pulse: (!) 58  Resp: 20  Temp: 97.9 F (36.6 C)  SpO2: 98%  Weight: 206 lb  9.6 oz (93.7 kg)  Height: 5\' 9"  (1.753 m)    GENERAL: The patient is a well-nourished male, in no acute distress. The vital signs are documented above. CARDIAC: There is a regular rate and rhythm.  PULMONARY: Non-labored respirations ABDOMEN: Soft and non-tender with normal pitched bowel sounds.  MUSCULOSKELETAL: There are no major deformities or cyanosis. NEUROLOGIC: No focal weakness or paresthesias are detected. SKIN: There are no ulcers or rashes noted. PSYCHIATRIC: The patient has a normal affect.  STUDIES:   I have reviewed his CTA with the following findings: 1. Infrarenal abdominal aortic aneurysm measuring 5.1 cm in diameter with associated moderate amount of mural thrombus within the dominant component of the aneurysm. If intervention is not pursued, recommend follow-up CTA of the abdomen and pelvis in 3-6 months. This recommendation follows ACR consensus guidelines: White Paper of the ACR Incidental Findings Committee II on Vascular Findings. J Am Coll Radiol 2013; 10:789-794. 2. The infrarenal aortic aneurysm extends to asymmetrically involves the right common iliac artery which measures 2.5 cm in maximal diameter. 3. 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 4. Short-segment non flow limiting dissection involving the proximal aspect of the left internal iliac artery which appears mildly ectatic measuring 1.5 cm in diameter. 5. 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 6. Short-segment approximately 70% luminal narrowing involving the origin of the right superficial femoral artery with tandem areas of at least 50% luminal narrowing involving the proximal aspect of the left superficial femoral artery. Correlation for symptoms of PAD is advised. 7. Aortic aneurysm NOS (ICD10-I71.9). Aortic  Atherosclerosis (ICD10-I70.0).  NON-VASCULAR  1. Scattered colonic diverticulosis without evidence of superimposed acute diverticulitis. 2. Diffuse thickening the urinary bladder wall, potentially accentuated due to underdistention though could be seen in the setting of a cystitis or bladder outlet obstruction. Clinical correlation is advised. 3. Potential solitary punctate (2 mm) nonobstructing right-sided renal stone.   MEDICAL ISSUES:   AAA: We discussed the risks and benefits of proceeding with endovascular aneurysm repair.  At this time he would like to get this fixed.  Maximum aortic diameter is 5.1 cm.  We discussed the risks and benefits the operation including but not limited to the risk of bleeding, intestinal ischemia, renal insufficiency, cardiopulmonary complications, stroke, death, access site complications and distal embolization.  All his questions were answered.  He will be scheduled in the immediate future.    Leia Alf, MD, FACS [facility] [phone] Pager [phone]

## 2019-04-30 ENCOUNTER — Other Ambulatory Visit: Payer: Self-pay | Admitting: *Deleted

## 2019-04-30 ENCOUNTER — Other Ambulatory Visit
Admission: RE | Admit: 2019-04-30 | Discharge: 2019-04-30 | Disposition: A | Discharge status: Home / Self Care | Payer: Medicare Other | Source: Ambulatory Visit | Attending: Surgery | Admitting: Surgery

## 2019-04-30 DIAGNOSIS — Z20822 Contact with and (suspected) exposure to covid-19: Secondary | ICD-10-CM | POA: Insufficient documentation

## 2019-04-30 DIAGNOSIS — Z01812 Encounter for preprocedural laboratory examination: Secondary | ICD-10-CM | POA: Insufficient documentation

## 2019-04-30 LAB — SARS CORONAVIRUS 2 (TAT 6-24 HRS): SARS Coronavirus 2: NEGATIVE

## 2019-04-30 NOTE — Progress Notes (Signed)
[id number] [name], [place] [address] Phone: [phone] Fax: [phone]  [facility] [place] [facility] [address] [place] [address] Phone: [phone] Fax: [phone]       Your procedure is scheduled on Friday, January 22nd.  Report to [facility] Main Entrance "A" at 5:30 A.M., and check in at the Admitting office.  Call this number if you have problems the morning of surgery:  [phone]  Call [phone] if you have any questions prior to your surgery date Monday-Friday 8am-4pm    Remember:  Do not eat or drink after midnight the night before your surgery    Take these medicines the morning of surgery with A SIP OF WATER   Albuterol inhaler - if needed (bring with you on the day of surgery)  Albuterol nebulizer  Nasal spray - if needed  Allegra - if needed  Metoprolol   Rosuvastatin (Crestor)  Symbicort inhaler - if needed (bring with you on the day of surgery)  Tiotropium Bromide Monohydrate (Spiriva) - (bring with you on the day of surgery)  7 days prior to surgery STOP taking any Aspirin (unless otherwise instructed by your surgeon), Aleve, Naproxen, Ibuprofen, Motrin, Advil, Goody's, BC's, all herbal medications, fish oil, and all vitamins.   WHAT DO I DO ABOUT MY DIABETES MEDICATION?   Marland Kitchen Do not take oral diabetes medicines (pills) the morning of surgery. - Metformin    HOW TO MANAGE YOUR DIABETES BEFORE AND AFTER SURGERY  Why is it important to control my blood sugar before and after surgery? . Improving blood sugar levels before and after surgery helps healing and can limit problems. . A way of improving blood sugar control is eating a healthy diet by: o  Eating less sugar and carbohydrates o  Increasing activity/exercise o  Talking with your doctor about reaching your blood sugar goals . High blood sugars  (greater than 180 mg/dL) can raise your risk of infections and slow your recovery, so you will need to focus on controlling your diabetes during the weeks before surgery. . Make sure that the doctor who takes care of your diabetes knows about your planned surgery including the date and location.  How do I manage my blood sugar before surgery? . Check your blood sugar at least 4 times a day, starting 2 days before surgery, to make sure that the level is not too high or low. . Check your blood sugar the morning of your surgery when you wake up and every 2 hours until you get to the Short Stay unit. o If your blood sugar is less than 70 mg/dL, you will need to treat for low blood sugar: - Do not take insulin. - Treat a low blood sugar (less than 70 mg/dL) with  cup of clear juice (cranberry or apple), 4 glucose tablets, OR glucose gel. - Recheck blood sugar in 15 minutes after treatment (to make sure it is greater than 70 mg/dL). If your blood sugar is not greater than 70 mg/dL on recheck, call [phone] for further instructions. . Report your blood sugar to the short stay nurse when you get to Short Stay.  . If you are admitted to the hospital after surgery: o Your blood sugar will be checked by the staff and you will probably be given insulin after surgery (instead of oral diabetes medicines) to make  sure you have good blood sugar levels. o The goal for blood sugar control after surgery is 80-180 mg/dL.    The Morning of Surgery  Do not wear jewelry.  Do not wear lotions, powders, colognes, or deodorant  Men may shave face and neck.  Do not bring valuables to the hospital.  [facility] is not responsible for any belongings or valuables.  If you are a smoker, DO NOT Smoke 24 hours prior to surgery  If you wear a CPAP at night please bring your mask the morning of surgery   Remember that you must have someone to transport you home after your surgery, and remain with you for 24 hours if  you are discharged the same day.   Please bring cases for contacts, glasses, hearing aids, dentures or bridgework because it cannot be worn into surgery.    Leave your suitcase in the car.  After surgery it may be brought to your room.  For patients admitted to the hospital, discharge time will be determined by your treatment team.  Patients discharged the day of surgery will not be allowed to drive home.    Special instructions:   - Preparing For Surgery  Before surgery, you can play an important role. Because skin is not sterile, your skin needs to be as free of germs as possible. You can reduce the number of germs on your skin by washing with CHG (chlorahexidine gluconate) Soap before surgery.  CHG is an antiseptic cleaner which kills germs and bonds with the skin to continue killing germs even after washing.    Oral Hygiene is also important to reduce your risk of infection.  Remember - BRUSH YOUR TEETH THE MORNING OF SURGERY WITH YOUR REGULAR TOOTHPASTE  Please do not use if you have an allergy to CHG or antibacterial soaps. If your skin becomes reddened/irritated stop using the CHG.  Do not shave (including legs and underarms) for at least 48 hours prior to first CHG shower. It is OK to shave your face.  Please follow these instructions carefully.   1. Shower the NIGHT BEFORE SURGERY and the MORNING OF SURGERY with CHG Soap.   2. If you chose to wash your hair, wash your hair first as usual with your normal shampoo.  3. After you shampoo, rinse your hair and body thoroughly to remove the shampoo.  4. Use CHG as you would any other liquid soap. You can apply CHG directly to the skin and wash gently with a scrungie or a clean washcloth.   5. Apply the CHG Soap to your body ONLY FROM THE NECK DOWN.  Do not use on open wounds or open sores. Avoid contact with your eyes, ears, mouth and genitals (private parts). Wash Face and genitals (private parts)  with your normal  soap.   6. Wash thoroughly, paying special attention to the area where your surgery will be performed.  7. Thoroughly rinse your body with warm water from the neck down.  8. DO NOT shower/wash with your normal soap after using and rinsing off the CHG Soap.  9. Pat yourself dry with a CLEAN TOWEL.  10. Wear CLEAN PAJAMAS to bed the night before surgery, wear comfortable clothes the morning of surgery  11. Place CLEAN SHEETS on your bed the night of your first shower and DO NOT SLEEP WITH PETS.    Day of Surgery:  Please shower the morning of surgery with the CHG soap Do not apply any deodorants/lotions. Please  wear clean clothes to the hospital/surgery center.   Remember to brush your teeth WITH YOUR REGULAR TOOTHPASTE.   Please read over the following fact sheets that you were given.

## 2019-05-01 ENCOUNTER — Encounter (HOSPITAL_COMMUNITY): Payer: Self-pay

## 2019-05-01 ENCOUNTER — Other Ambulatory Visit: Payer: Self-pay

## 2019-05-01 ENCOUNTER — Encounter (HOSPITAL_COMMUNITY)
Admission: RE | Admit: 2019-05-01 | Discharge: 2019-05-01 | Disposition: A | Discharge status: Home / Self Care | Payer: Medicare Other | Source: Ambulatory Visit | Attending: Surgery | Admitting: Surgery

## 2019-05-01 DIAGNOSIS — Z951 Presence of aortocoronary bypass graft: Secondary | ICD-10-CM | POA: Insufficient documentation

## 2019-05-01 DIAGNOSIS — J449 Chronic obstructive pulmonary disease, unspecified: Secondary | ICD-10-CM | POA: Insufficient documentation

## 2019-05-01 DIAGNOSIS — Z79899 Other long term (current) drug therapy: Secondary | ICD-10-CM | POA: Insufficient documentation

## 2019-05-01 DIAGNOSIS — Z01818 Encounter for other preprocedural examination: Secondary | ICD-10-CM | POA: Insufficient documentation

## 2019-05-01 DIAGNOSIS — Z7951 Long term (current) use of inhaled steroids: Secondary | ICD-10-CM | POA: Insufficient documentation

## 2019-05-01 LAB — COMPREHENSIVE METABOLIC PANEL
ALT: 25 U/L (ref 0–44)
AST: 20 U/L (ref 15–41)
Albumin: 3.6 g/dL (ref 3.5–5.0)
Alkaline Phosphatase: 50 U/L (ref 38–126)
Anion gap: 9 (ref 5–15)
BUN: 14 mg/dL (ref 8–23)
CO2: 23 mmol/L (ref 22–32)
Calcium: 9.3 mg/dL (ref 8.9–10.3)
Chloride: 109 mmol/L (ref 98–111)
Creatinine, Ser: 1.09 mg/dL (ref 0.61–1.24)
GFR calc Af Amer: >60 mL/min (ref >60)
GFR calc non Af Amer: >60 mL/min (ref >60)
Glucose, Bld: 111 mg/dL — ABNORMAL HIGH (ref 70–99)
Potassium: 4.1 mmol/L (ref 3.5–5.1)
Sodium: 141 mmol/L (ref 135–145)
Total Bilirubin: 0.7 mg/dL (ref 0.3–1.2)
Total Protein: 6.2 g/dL — ABNORMAL LOW (ref 6.5–8.1)

## 2019-05-01 LAB — URINALYSIS, ROUTINE W REFLEX MICROSCOPIC
Bilirubin Urine: NEGATIVE
Glucose, UA: NEGATIVE mg/dL
Hgb urine dipstick: NEGATIVE
Ketones, ur: NEGATIVE mg/dL
Leukocytes,Ua: NEGATIVE
Nitrite: NEGATIVE
Protein, ur: NEGATIVE mg/dL
Specific Gravity, Urine: 1.02 (ref 1.005–1.030)
pH: 5 (ref 5.0–8.0)

## 2019-05-01 LAB — BLOOD GAS, ARTERIAL
Acid-Base Excess: 0.7 mmol/L (ref 0.0–2.0)
Bicarbonate: 24.8 mmol/L (ref 20.0–28.0)
Drawn by: 421801
FIO2: 21
O2 Saturation: 97.8 %
Patient temperature: 37
pCO2 arterial: 40.2 mmHg (ref 32.0–48.0)
pH, Arterial: 7.408 (ref 7.350–7.450)
pO2, Arterial: 103 mmHg (ref 83.0–108.0)

## 2019-05-01 LAB — HEMOGLOBIN A1C
Hgb A1c MFr Bld: 6.1 % — ABNORMAL HIGH (ref 4.8–5.6)
Mean Plasma Glucose: 128.37 mg/dL

## 2019-05-01 LAB — SURGICAL PCR SCREEN
MRSA, PCR: NEGATIVE
Staphylococcus aureus: NEGATIVE

## 2019-05-01 LAB — PROTIME-INR
INR: 1 (ref 0.8–1.2)
Prothrombin Time: 13.2 seconds (ref 11.4–15.2)

## 2019-05-01 LAB — CBC
HCT: 43.5 % (ref 39.0–52.0)
Hemoglobin: 13.9 g/dL (ref 13.0–17.0)
MCH: 31.4 pg (ref 26.0–34.0)
MCHC: 32 g/dL (ref 30.0–36.0)
MCV: 98.2 fL (ref 80.0–100.0)
Platelets: 204 10*3/uL (ref 150–400)
RBC: 4.43 MIL/uL (ref 4.22–5.81)
RDW: 14.4 % (ref 11.5–15.5)
WBC: 8.9 10*3/uL (ref 4.0–10.5)
nRBC: 0 % (ref 0.0–0.2)

## 2019-05-01 LAB — GLUCOSE, CAPILLARY: Glucose-Capillary: 102 mg/dL — ABNORMAL HIGH (ref 70–99)

## 2019-05-01 LAB — APTT: aPTT: 27 seconds (ref 24–36)

## 2019-05-01 NOTE — Progress Notes (Signed)
PCP - Dr. Hortencia Pilar Cardiologist - Dr. Clayborn Bigness Pulmonologist - Dr. Raul Del  PPM/ICD - n/a Device Orders -  Rep Notified -   Chest x-ray - 12-31-18 EKG - 10-05-18 Stress Test - patient denies ECHO - 10-04-18 Cardiac Cath - 09-19-18  Sleep Study - patient denies CPAP -   Fasting Blood Sugar - 120s Checks Blood Sugar ____1_ time a day  Blood Thinner Instructions: Aspirin Instructions: per patient, Becky instructed to continue ASA  ERAS Protcol - n/a PRE-SURGERY Ensure or G2-   COVID TEST- 04/29/18, negative   Anesthesia review: yes, cardiac history; COPD  Patient denies shortness of breath, fever, cough and chest pain at PAT appointment   All instructions explained to the patient, with a verbal understanding of the material. Patient agrees to go over the instructions while at home for a better understanding. Patient also instructed to self quarantine after being tested for COVID-19. The opportunity to ask questions was provided.

## 2019-05-01 NOTE — Progress Notes (Signed)
[id number] [address], [place] [address] Phone: [phone] Fax: [phone]  [facility] [place] [place] [facility] [address] [place] [address] Phone: [phone] Fax: [phone]       Your procedure is scheduled on Friday, January 22nd.  Report to [facility] Main Entrance "A" at 5:30 A.M., and check in at the Admitting office.  Call this number if you have problems the morning of surgery:  [phone]  Call [phone] if you have any questions prior to your surgery date Monday-Friday 8am-4pm    Remember:  Do not eat or drink after midnight the night before your surgery    Take these medicines the morning of surgery with A SIP OF WATER   Albuterol (Proventil/ Ventolin) inhaler - if needed (bring with you on the day of surgery)  Albuterol (Proventil) nebulizer - if needed  Azelastine (Astelin) Nasal spray - if needed  Fexofenadine (Allegra)  Fluticasone (Flonase)  Metoprolol tartrate (Lopressor)  Rosuvastatin (Crestor)  Symbicort inhaler (bring with you on the day of surgery)  Tiotropium Bromide Monohydrate (Spiriva) - (bring with you on the day of surgery)  7 days prior to surgery STOP taking any Aspirin (unless otherwise instructed by your surgeon), Aleve, Naproxen, Ibuprofen, Motrin, Advil, Goody's, BC's, all herbal medications, fish oil, and all vitamins.  Follow your surgeon's instructions on when to stop Aspirin.  If no instructions were given by your surgeon then you will need to call the office to get those instructions.    Please bring all inhalers with you the day of surgery.    WHAT DO I DO ABOUT MY DIABETES MEDICATION?  Marland Kitchen Do not take oral diabetes medicines (pills) the morning of surgery. - Do NOT take Metformin (Glucophage) the morning of surgery    HOW TO MANAGE YOUR DIABETES BEFORE AND AFTER SURGERY  Why is it important  to control my blood sugar before and after surgery? . Improving blood sugar levels before and after surgery helps healing and can limit problems. . A way of improving blood sugar control is eating a healthy diet by: o  Eating less sugar and carbohydrates o  Increasing activity/exercise o  Talking with your doctor about reaching your blood sugar goals . High blood sugars (greater than 180 mg/dL) can raise your risk of infections and slow your recovery, so you will need to focus on controlling your diabetes during the weeks before surgery. . Make sure that the doctor who takes care of your diabetes knows about your planned surgery including the date and location.  How do I manage my blood sugar before surgery? . Check your blood sugar at least 4 times a day, starting 2 days before surgery, to make sure that the level is not too high or low. . Check your blood sugar the morning of your surgery when you wake up and every 2 hours until you get to the Short Stay unit. o If your blood sugar is less than 70 mg/dL, you will need to treat for low blood sugar: - Do not take insulin. - Treat a low blood sugar (less than 70 mg/dL) with  cup of clear juice (cranberry or apple), 4 glucose tablets, OR glucose gel. - Recheck blood sugar in 15 minutes after treatment (to make sure it is greater than 70 mg/dL). If your blood sugar is not greater than 70 mg/dL on  recheck, call [phone] for further instructions. . Report your blood sugar to the short stay nurse when you get to Short Stay.  . If you are admitted to the hospital after surgery: o Your blood sugar will be checked by the staff and you will probably be given insulin after surgery (instead of oral diabetes medicines) to make sure you have good blood sugar levels. o The goal for blood sugar control after surgery is 80-180 mg/dL.    The Morning of Surgery  Do not wear jewelry.  Do not wear lotions, powders, colognes, or deodorant  Men may shave  face and neck.  Do not bring valuables to the hospital.  [facility] is not responsible for any belongings or valuables.  If you are a smoker, DO NOT Smoke 24 hours prior to surgery  If you wear a CPAP at night please bring your mask the morning of surgery   Remember that you must have someone to transport you home after your surgery, and remain with you for 24 hours if you are discharged the same day.  Please bring cases for contacts, glasses, hearing aids, dentures or bridgework because it cannot be worn into surgery.    Leave your suitcase in the car.  After surgery it may be brought to your room.  For patients admitted to the hospital, discharge time will be determined by your treatment team.  Patients discharged the day of surgery will not be allowed to drive home.    Special instructions:   [place]- Preparing For Surgery  Before surgery, you can play an important role. Because skin is not sterile, your skin needs to be as free of germs as possible. You can reduce the number of germs on your skin by washing with CHG (chlorahexidine gluconate) Soap before surgery.  CHG is an antiseptic cleaner which kills germs and bonds with the skin to continue killing germs even after washing.    Oral Hygiene is also important to reduce your risk of infection.  Remember - BRUSH YOUR TEETH THE MORNING OF SURGERY WITH YOUR REGULAR TOOTHPASTE  Please do not use if you have an allergy to CHG or antibacterial soaps. If your skin becomes reddened/irritated stop using the CHG.  Do not shave (including legs and underarms) for at least 48 hours prior to first CHG shower. It is OK to shave your face.  Please follow these instructions carefully.   1. Shower the NIGHT BEFORE SURGERY and the MORNING OF SURGERY with CHG Soap.   2. If you chose to wash your hair, wash your hair first as usual with your normal shampoo.  3. After you shampoo, rinse your hair and body thoroughly to remove the  shampoo.  4. Use CHG as you would any other liquid soap. You can apply CHG directly to the skin and wash gently with a scrungie or a clean washcloth.   5. Apply the CHG Soap to your body ONLY FROM THE NECK DOWN.  Do not use on open wounds or open sores. Avoid contact with your eyes, ears, mouth and genitals (private parts). Wash Face and genitals (private parts)  with your normal soap.   6. Wash thoroughly, paying special attention to the area where your surgery will be performed.  7. Thoroughly rinse your body with warm water from the neck down.  8. DO NOT shower/wash with your normal soap after using and rinsing off the CHG Soap.  9. Pat yourself dry with a CLEAN TOWEL.  10. Wear  CLEAN PAJAMAS to bed the night before surgery  11. Place CLEAN SHEETS on your bed the night of your first shower and DO NOT SLEEP WITH PETS.    Day of Surgery:  Please shower the morning of surgery with the CHG soap Do not apply any deodorants/lotions. Please wear clean comfortable clothes to the hospital/surgery center.   Remember to brush your teeth WITH YOUR REGULAR TOOTHPASTE.   Please read over the following fact sheets that you were given.

## 2019-05-02 LAB — TYPE AND SCREEN
ABO/RH(D): A POS
Antibody Screen: NEGATIVE

## 2019-05-02 NOTE — Anesthesia Preprocedure Evaluation (Addendum)
Anesthesia Evaluation  Patient identified by MRN, date of birth, ID band Patient awake    Reviewed: Allergy & Precautions, NPO status , Patient's Chart, lab work & pertinent test results, reviewed documented beta blocker date and time   History of Anesthesia Complications (+) PONVNegative for: history of anesthetic complications  Airway Mallampati: II  TM Distance: >3 FB Neck ROM: Full    Dental  (+) Dental Advisory Given, Missing   Pulmonary COPD, former smoker,    Pulmonary exam normal        Cardiovascular hypertension, Pt. on medications and Pt. on home beta blockers + CAD, + CABG and + Peripheral Vascular Disease  Normal cardiovascular exam     Neuro/Psych negative neurological ROS  negative psych ROS   GI/Hepatic Neg liver ROS, hiatal hernia, GERD  ,  Endo/Other  diabetes  Renal/GU negative Renal ROS     Musculoskeletal   Abdominal   Peds  Hematology negative hematology ROS (+)   Anesthesia Other Findings   Reproductive/Obstetrics                          Anesthesia Physical Anesthesia Plan  ASA: IV  Anesthesia Plan: General   Post-op Pain Management:    Induction: Intravenous  PONV Risk Score and Plan: 2 and Ondansetron, Dexamethasone and Treatment may vary due to age or medical condition  Airway Management Planned: Oral ETT  Additional Equipment: Arterial line  Intra-op Plan:   Post-operative Plan: Possible Post-op intubation/ventilation  Informed Consent: I have reviewed the patients History and Physical, chart, labs and discussed the procedure including the risks, benefits and alternatives for the proposed anesthesia with the patient or authorized representative who has indicated his/her understanding and acceptance.     Dental advisory given  Plan Discussed with: CRNA and Anesthesiologist  Anesthesia Plan Comments: (Pt underwent redo CABG x 1 SVG to LAD  10/04/18.  He has significant COPD and had an air leak from the left lung with subcutaneous emphysema.  After conservative measures with chest tube did not help he underwent intrabronchial valve placement in the left upper lobe and lingula with resolution of the air leak and subcutaneous emphysema. Subsequently improved and underwent uncomplicated removal of endobronchial valves 12/31/18.   Last seen by cardiologist Dr. Clayborn Bigness at [facility] 02/14/19, per note SOB was improved since CABG but continued to have mod-severe COPD.  Pulmonologist Dr. Raul Del at [facility] follows for severe COPD. Last seen 02/20/19, per note he was wheezing more. He was advised to continuesymbicort 160/4.5 two puffs q 12 hours, spiriva, albuterol,singulair 10 mg q day, Daliresp 250 mg q day.  Preop labs reviewed, unremarkable. DMII well controlled, A1c 6.1.  CT Ango abd/pelvis 04/24/19: 1. Infrarenal abdominal aortic aneurysm measuring 5.1 cm in diameter with associated moderate amount of mural thrombus within the dominant component of the aneurysm. If intervention is not pursued, recommend follow-up CTA of the abdomen and pelvis in 3-6 months. This recommendation follows ACR consensus guidelines: White Paper of the ACR Incidental Findings Committee II on Vascular Findings. J Am Coll Radiol 2013; 10:789-794. 2. The infrarenal aortic aneurysm extends to asymmetrically involves the right common iliac artery which measures 2.5 cm in maximal diameter. 3. 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 4. Short-segment non flow limiting dissection involving the proximal aspect of the left internal iliac artery which appears mildly ectatic measuring 1.5 cm in diameter. 5. 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 6. Short-segment approximately 70% luminal narrowing involving  the origin of the right superficial femoral artery with tandem areas of at least 50% luminal narrowing involving the proximal aspect of the left superficial femoral artery. Correlation for symptoms of PAD is advised. 7. Aortic aneurysm NOS (ICD10-I71.9). Aortic Atherosclerosis (ICD10-I70.0).  PORTABLE CHEST 1 VIEW 12/31/18: COMPARISON:  Chest x-ray 12/31/2018. FINDINGS: Lung volumes are normal. No consolidative airspace disease. No pleural effusions. No pneumothorax. No pulmonary nodule or mass noted. Previously noted endobronchial valves projecting over the left perihilar region are no longer identified. Pulmonary vasculature and the cardiomediastinal silhouette are within normal limits. Aortic atherosclerosis. Status post median sternotomy for CABG. Orthopedic fixation hardware in the lower cervical spine incidentally noted.  IMPRESSION: 1. Status post removal of left-sided endobronchial valves, with no acute complicating features. 2. Aortic atherosclerosis.  TEE 10/04/18: - Left Ventricle: The left ventricle is unchanged from pre-bypass. has normal systolic function, with an ejection fraction of 55%. - Aorta: The aorta appears unchanged from pre-bypass. - Left Atrial Appendage: The left atrial appendage appears unchanged from pre-bypass. - Aortic Valve: The aortic valve appears unchanged from pre-bypass. - Mitral Valve: The mitral valve appears unchanged from pre-bypass. - Tricuspid Valve: The tricuspid valve appears unchanged from pre-bypass.  Carotid US 09/18/18: Right Carotid: Velocities in the right ICA are consistent with a 1-39% stenosis. Left Carotid: Velocities in the left ICA are consistent with a 1-39% stenosis. Vertebrals: Right vertebral artery demonstrates antegrade flow. Atypical left vertebral waveforms.  PFTs 09/18/18: FVC-%Pred-Pre Latest Units: % 48 FEV1-%Pred-Pre Latest Units: % 29 Pre FEV1/FVC ratio Latest Units: % 45 FEV1FVC-%Pred-Pre Latest Units:  % 60 TLC % pred             Latest Units: % 79 DLCO unc % pred Latest Units: % 56 )     Anesthesia Quick Evaluation

## 2019-05-02 NOTE — Progress Notes (Signed)
Anesthesia Chart Review:  Pt underwent redo CABG x 1 SVG to LAD 10/04/18.  He has significant COPD and had an air leak from the left lung with subcutaneous emphysema.  After conservative measures with chest tube did not help he underwent intrabronchial valve placement in the left upper lobe and lingula with resolution of the air leak and subcutaneous emphysema. Subsequently improved and underwent uncomplicated removal of endobronchial valves 12/31/18.   Last seen by cardiologist Dr. Clayborn Bigness at [facility] 02/14/19, per note SOB was improved since CABG but continued to have mod-severe COPD.  Pulmonologist Dr. Raul Del at [facility] follows for severe COPD. Last seen 02/20/19, per note he was wheezing more. He was advised to continuesymbicort 160/4.5 two puffs q 12 hours, spiriva, albuterol,singulair 10 mg q day, Daliresp 250 mg q day.  Preop labs reviewed, unremarkable. DMII well controlled, A1c 6.1.  CT Ango abd/pelvis 04/24/19: 1. Infrarenal abdominal aortic aneurysm measuring 5.1 cm in diameter with associated moderate amount of mural thrombus within the dominant component of the aneurysm. If intervention is not pursued, recommend follow-up CTA of the abdomen and pelvis in 3-6 months. This recommendation follows ACR consensus guidelines: White Paper of the ACR Incidental Findings Committee II on Vascular Findings. J Am Coll Radiol 2013; 10:789-794. 2. The infrarenal aortic aneurysm extends to asymmetrically involves the right common iliac artery which measures 2.5 cm in maximal diameter. 3. 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 4. Short-segment non flow limiting dissection involving the proximal aspect of the left internal iliac artery which appears mildly ectatic measuring 1.5 cm in diameter. 5. 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 6. Short-segment approximately 70% luminal narrowing involving the origin of the right superficial femoral artery with tandem areas of at least 50% luminal narrowing involving the proximal aspect of the left superficial femoral artery. Correlation for symptoms of PAD is advised. 7. Aortic aneurysm NOS (ICD10-I71.9). Aortic Atherosclerosis (ICD10-I70.0).  PORTABLE CHEST 1 VIEW 12/31/18: COMPARISON:  Chest x-ray 12/31/2018. FINDINGS: Lung volumes are normal. No consolidative airspace disease. No pleural effusions. No pneumothorax. No pulmonary nodule or mass noted. Previously noted endobronchial valves projecting over the left perihilar region are no longer identified. Pulmonary vasculature and the cardiomediastinal silhouette are within normal limits. Aortic atherosclerosis. Status post median sternotomy for CABG. Orthopedic fixation hardware in the lower cervical spine incidentally noted.  IMPRESSION: 1. Status post removal of left-sided endobronchial valves, with no acute complicating features. 2. Aortic atherosclerosis.  TEE 10/04/18: - Left Ventricle: The left ventricle is unchanged from pre-bypass. has normal systolic function, with an ejection fraction of 55%. - Aorta: The aorta appears unchanged from pre-bypass. - Left Atrial Appendage: The left atrial appendage appears unchanged from pre-bypass. - Aortic Valve: The aortic valve appears unchanged from pre-bypass. - Mitral Valve: The mitral valve appears unchanged from pre-bypass. - Tricuspid Valve: The tricuspid valve appears unchanged from pre-bypass.  Carotid US 09/18/18: Right Carotid: Velocities in the right ICA are consistent with a 1-39% stenosis. Left Carotid: Velocities in the left ICA are consistent with a 1-39% stenosis. Vertebrals: Right vertebral artery demonstrates antegrade flow. Atypical left vertebral waveforms.  PFTs 09/18/18:  Ref. Range 09/18/2018 12:38  FVC-Pre Latest Units: L 2.09  FVC-%Pred-Pre  Latest Units: % 48  FEV1-Pre Latest Units: L 0.93  FEV1-%Pred-Pre Latest Units: % 29  Pre FEV1/FVC ratio Latest Units: % 45  FEV1FVC-%Pred-Pre Latest Units: %  60  TLC Latest Units: L 5.43  TLC % pred Latest Units: % 79  DLCO unc Latest Units: ml/min/mmHg 14.34  DLCO unc % pred Latest Units: % [address]    Melvin Brewer [facility] Short Stay Center/Anesthesiology Phone [phone] 05/02/2019 9:56 AM

## 2019-05-03 ENCOUNTER — Inpatient Hospital Stay (HOSPITAL_COMMUNITY): Payer: Medicare Other | Admitting: Anesthesiology

## 2019-05-03 ENCOUNTER — Other Ambulatory Visit: Payer: Self-pay

## 2019-05-03 ENCOUNTER — Encounter (HOSPITAL_COMMUNITY): Payer: Self-pay | Admitting: Surgery

## 2019-05-03 ENCOUNTER — Inpatient Hospital Stay (HOSPITAL_COMMUNITY)
Admission: RE | Admit: 2019-05-03 | Discharge: 2019-05-04 | DRG: 269 | Disposition: A | Discharge status: Home / Self Care | Payer: Medicare Other | Attending: Surgery | Admitting: Surgery

## 2019-05-03 ENCOUNTER — Encounter (HOSPITAL_COMMUNITY)
Admission: RE | Disposition: A | Discharge status: Home / Self Care | Payer: Self-pay | Source: Home / Self Care | Attending: Surgery

## 2019-05-03 ENCOUNTER — Inpatient Hospital Stay (HOSPITAL_COMMUNITY): Payer: Medicare Other

## 2019-05-03 ENCOUNTER — Inpatient Hospital Stay (HOSPITAL_COMMUNITY): Payer: Medicare Other | Admitting: Physician Assistant

## 2019-05-03 DIAGNOSIS — Z7982 Long term (current) use of aspirin: Secondary | ICD-10-CM

## 2019-05-03 DIAGNOSIS — Z87891 Personal history of nicotine dependence: Secondary | ICD-10-CM

## 2019-05-03 DIAGNOSIS — E1151 Type 2 diabetes mellitus with diabetic peripheral angiopathy without gangrene: Secondary | ICD-10-CM | POA: Diagnosis present

## 2019-05-03 DIAGNOSIS — J309 Allergic rhinitis, unspecified: Secondary | ICD-10-CM | POA: Diagnosis present

## 2019-05-03 DIAGNOSIS — H3530 Unspecified macular degeneration: Secondary | ICD-10-CM | POA: Diagnosis present

## 2019-05-03 DIAGNOSIS — Z888 Allergy status to other drugs, medicaments and biological substances status: Secondary | ICD-10-CM

## 2019-05-03 DIAGNOSIS — Z951 Presence of aortocoronary bypass graft: Secondary | ICD-10-CM

## 2019-05-03 DIAGNOSIS — Z20822 Contact with and (suspected) exposure to covid-19: Secondary | ICD-10-CM | POA: Diagnosis present

## 2019-05-03 DIAGNOSIS — Z7984 Long term (current) use of oral hypoglycemic drugs: Secondary | ICD-10-CM

## 2019-05-03 DIAGNOSIS — I10 Essential (primary) hypertension: Secondary | ICD-10-CM | POA: Diagnosis present

## 2019-05-03 DIAGNOSIS — K219 Gastro-esophageal reflux disease without esophagitis: Secondary | ICD-10-CM | POA: Diagnosis present

## 2019-05-03 DIAGNOSIS — E785 Hyperlipidemia, unspecified: Secondary | ICD-10-CM | POA: Diagnosis present

## 2019-05-03 DIAGNOSIS — I714 Abdominal aortic aneurysm, without rupture, unspecified: Secondary | ICD-10-CM | POA: Diagnosis present

## 2019-05-03 DIAGNOSIS — Z7289 Other problems related to lifestyle: Secondary | ICD-10-CM | POA: Diagnosis not present

## 2019-05-03 DIAGNOSIS — J449 Chronic obstructive pulmonary disease, unspecified: Secondary | ICD-10-CM | POA: Diagnosis present

## 2019-05-03 DIAGNOSIS — B009 Herpesviral infection, unspecified: Secondary | ICD-10-CM | POA: Diagnosis present

## 2019-05-03 DIAGNOSIS — Z9889 Other specified postprocedural states: Secondary | ICD-10-CM

## 2019-05-03 DIAGNOSIS — Z7951 Long term (current) use of inhaled steroids: Secondary | ICD-10-CM | POA: Diagnosis not present

## 2019-05-03 DIAGNOSIS — Z882 Allergy status to sulfonamides status: Secondary | ICD-10-CM | POA: Diagnosis not present

## 2019-05-03 DIAGNOSIS — I2510 Atherosclerotic heart disease of native coronary artery without angina pectoris: Secondary | ICD-10-CM | POA: Diagnosis present

## 2019-05-03 DIAGNOSIS — K449 Diaphragmatic hernia without obstruction or gangrene: Secondary | ICD-10-CM | POA: Diagnosis present

## 2019-05-03 DIAGNOSIS — Z8679 Personal history of other diseases of the circulatory system: Secondary | ICD-10-CM

## 2019-05-03 DIAGNOSIS — E669 Obesity, unspecified: Secondary | ICD-10-CM | POA: Diagnosis present

## 2019-05-03 DIAGNOSIS — Z79899 Other long term (current) drug therapy: Secondary | ICD-10-CM

## 2019-05-03 DIAGNOSIS — Z6829 Body mass index (BMI) 29.0-29.9, adult: Secondary | ICD-10-CM | POA: Diagnosis not present

## 2019-05-03 HISTORY — PX: ULTRASOUND GUIDANCE FOR VASCULAR ACCESS: SHX6516

## 2019-05-03 HISTORY — PX: ABDOMINAL AORTIC ENDOVASCULAR STENT GRAFT: SHX5707

## 2019-05-03 LAB — CBC
HCT: 38.4 % — ABNORMAL LOW (ref 39.0–52.0)
Hemoglobin: 12.2 g/dL — ABNORMAL LOW (ref 13.0–17.0)
MCH: 31.5 pg (ref 26.0–34.0)
MCHC: 31.8 g/dL (ref 30.0–36.0)
MCV: 99.2 fL (ref 80.0–100.0)
Platelets: 158 10*3/uL (ref 150–400)
RBC: 3.87 MIL/uL — ABNORMAL LOW (ref 4.22–5.81)
RDW: 14.3 % (ref 11.5–15.5)
WBC: 6.3 10*3/uL (ref 4.0–10.5)
nRBC: 0 % (ref 0.0–0.2)

## 2019-05-03 LAB — BASIC METABOLIC PANEL
Anion gap: 7 (ref 5–15)
BUN: 14 mg/dL (ref 8–23)
CO2: 24 mmol/L (ref 22–32)
Calcium: 8.4 mg/dL — ABNORMAL LOW (ref 8.9–10.3)
Chloride: 108 mmol/L (ref 98–111)
Creatinine, Ser: 1.11 mg/dL (ref 0.61–1.24)
GFR calc Af Amer: >60 mL/min (ref >60)
GFR calc non Af Amer: >60 mL/min (ref >60)
Glucose, Bld: 126 mg/dL — ABNORMAL HIGH (ref 70–99)
Potassium: 4.4 mmol/L (ref 3.5–5.1)
Sodium: 139 mmol/L (ref 135–145)

## 2019-05-03 LAB — GLUCOSE, CAPILLARY: Glucose-Capillary: 110 mg/dL — ABNORMAL HIGH (ref 70–99)

## 2019-05-03 LAB — POCT ACTIVATED CLOTTING TIME: Activated Clotting Time: 208 seconds

## 2019-05-03 LAB — PROTIME-INR
INR: 1.2 (ref 0.8–1.2)
Prothrombin Time: 15.1 seconds (ref 11.4–15.2)

## 2019-05-03 LAB — MAGNESIUM: Magnesium: 1.8 mg/dL (ref 1.7–2.4)

## 2019-05-03 LAB — APTT: aPTT: 44 seconds — ABNORMAL HIGH (ref 24–36)

## 2019-05-03 SURGERY — INSERTION, ENDOVASCULAR STENT GRAFT, AORTA, ABDOMINAL
Anesthesia: General | Site: Groin

## 2019-05-03 MED ORDER — SODIUM CHLORIDE 0.9 % IV SOLN
INTRAVENOUS | Status: DC | PRN
  Administered 2019-05-03: 500 mL

## 2019-05-03 MED ORDER — LABETALOL HCL 5 MG/ML IV SOLN
INTRAVENOUS | Status: AC
  Filled 2019-05-03: qty 4

## 2019-05-03 MED ORDER — ALBUTEROL SULFATE HFA 108 (90 BASE) MCG/ACT IN AERS
INHALATION_SPRAY | RESPIRATORY_TRACT | Status: DC | PRN
  Administered 2019-05-03: 5 via RESPIRATORY_TRACT

## 2019-05-03 MED ORDER — LACTATED RINGERS IV SOLN: INTRAVENOUS | Status: DC | PRN

## 2019-05-03 MED ORDER — VALACYCLOVIR HCL 500 MG PO TABS
500.0000 mg | ORAL_TABLET | Freq: Every evening | ORAL | Status: DC
  Administered 2019-05-03: 500 mg via ORAL
  Filled 2019-05-03: qty 1

## 2019-05-03 MED ORDER — PROTAMINE SULFATE 10 MG/ML IV SOLN
INTRAVENOUS | Status: AC
  Filled 2019-05-03: qty 5

## 2019-05-03 MED ORDER — SODIUM CHLORIDE 0.9 % IV SOLN: 500.0000 mL | Freq: Once | INTRAVENOUS | Status: DC | PRN

## 2019-05-03 MED ORDER — PHENYLEPHRINE 40 MCG/ML (10ML) SYRINGE FOR IV PUSH (FOR BLOOD PRESSURE SUPPORT)
PREFILLED_SYRINGE | INTRAVENOUS | Status: AC
  Filled 2019-05-03: qty 10

## 2019-05-03 MED ORDER — PHENOL 1.4 % MT LIQD: 1.0000 | OROMUCOSAL | Status: DC | PRN

## 2019-05-03 MED ORDER — ONDANSETRON HCL 4 MG/2ML IJ SOLN
INTRAMUSCULAR | Status: AC
  Filled 2019-05-03: qty 2

## 2019-05-03 MED ORDER — ASPIRIN EC 81 MG PO TBEC
81.0000 mg | DELAYED_RELEASE_TABLET | Freq: Every evening | ORAL | Status: DC
  Administered 2019-05-03: 81 mg via ORAL
  Filled 2019-05-03: qty 1

## 2019-05-03 MED ORDER — ACETAMINOPHEN 325 MG PO TABS: 325.0000 mg | ORAL_TABLET | ORAL | Status: DC | PRN

## 2019-05-03 MED ORDER — ALUM & MAG HYDROXIDE-SIMETH 200-200-20 MG/5ML PO SUSP: 15.0000 mL | ORAL | Status: DC | PRN

## 2019-05-03 MED ORDER — TIOTROPIUM BROMIDE MONOHYDRATE 2.5 MCG/ACT IN AERS: 2.0000 | INHALATION_SPRAY | Freq: Every day | RESPIRATORY_TRACT | Status: DC

## 2019-05-03 MED ORDER — MIDAZOLAM HCL 5 MG/5ML IJ SOLN
INTRAMUSCULAR | Status: DC | PRN
  Administered 2019-05-03: 2 mg via INTRAVENOUS

## 2019-05-03 MED ORDER — CHLORHEXIDINE GLUCONATE CLOTH 2 % EX PADS: 6.0000 | MEDICATED_PAD | Freq: Every day | CUTANEOUS | Status: DC

## 2019-05-03 MED ORDER — METOPROLOL TARTRATE 25 MG PO TABS
25.0000 mg | ORAL_TABLET | Freq: Two times a day (BID) | ORAL | Status: DC
  Administered 2019-05-03 – 2019-05-04 (×2): 25 mg via ORAL
  Filled 2019-05-03 (×2): qty 1

## 2019-05-03 MED ORDER — PANTOPRAZOLE SODIUM 40 MG PO TBEC
40.0000 mg | DELAYED_RELEASE_TABLET | Freq: Every day | ORAL | Status: DC
  Administered 2019-05-03 – 2019-05-04 (×2): 40 mg via ORAL
  Filled 2019-05-03 (×2): qty 1

## 2019-05-03 MED ORDER — LIDOCAINE 2% (20 MG/ML) 5 ML SYRINGE
INTRAMUSCULAR | Status: DC | PRN
  Administered 2019-05-03: 100 mg via INTRAVENOUS

## 2019-05-03 MED ORDER — PROTAMINE SULFATE 10 MG/ML IV SOLN
INTRAVENOUS | Status: DC | PRN
  Administered 2019-05-03: 10 mg via INTRAVENOUS
  Administered 2019-05-03: 20 mg via INTRAVENOUS
  Administered 2019-05-03 (×2): 10 mg via INTRAVENOUS

## 2019-05-03 MED ORDER — FENTANYL CITRATE (PF) 250 MCG/5ML IJ SOLN
INTRAMUSCULAR | Status: AC
  Filled 2019-05-03: qty 5

## 2019-05-03 MED ORDER — POTASSIUM CHLORIDE CRYS ER 20 MEQ PO TBCR: 20.0000 meq | EXTENDED_RELEASE_TABLET | Freq: Every day | ORAL | Status: DC | PRN

## 2019-05-03 MED ORDER — FENTANYL CITRATE (PF) 100 MCG/2ML IJ SOLN
INTRAMUSCULAR | Status: DC | PRN
  Administered 2019-05-03: 150 ug via INTRAVENOUS

## 2019-05-03 MED ORDER — HEPARIN SODIUM (PORCINE) 1000 UNIT/ML IJ SOLN
INTRAMUSCULAR | Status: DC | PRN
  Administered 2019-05-03: 9000 [IU] via INTRAVENOUS
  Administered 2019-05-03: 2000 [IU] via INTRAVENOUS

## 2019-05-03 MED ORDER — UMECLIDINIUM BROMIDE 62.5 MCG/INH IN AEPB
1.0000 | INHALATION_SPRAY | Freq: Every day | RESPIRATORY_TRACT | Status: DC
  Filled 2019-05-03: qty 7

## 2019-05-03 MED ORDER — ALBUTEROL SULFATE (2.5 MG/3ML) 0.083% IN NEBU: 2.5000 mg | INHALATION_SOLUTION | Freq: Four times a day (QID) | RESPIRATORY_TRACT | Status: DC | PRN

## 2019-05-03 MED ORDER — EPHEDRINE 5 MG/ML INJ
INTRAVENOUS | Status: AC
  Filled 2019-05-03: qty 10

## 2019-05-03 MED ORDER — CEFAZOLIN SODIUM-DEXTROSE 2-4 GM/100ML-% IV SOLN
2.0000 g | INTRAVENOUS | Status: AC
  Administered 2019-05-03: 2 g via INTRAVENOUS

## 2019-05-03 MED ORDER — ACETAMINOPHEN 325 MG RE SUPP: 325.0000 mg | RECTAL | Status: DC | PRN

## 2019-05-03 MED ORDER — SUGAMMADEX SODIUM 200 MG/2ML IV SOLN
INTRAVENOUS | Status: DC | PRN
  Administered 2019-05-03: 200 mg via INTRAVENOUS

## 2019-05-03 MED ORDER — CHLORHEXIDINE GLUCONATE 4 % EX LIQD: 60.0000 mL | Freq: Once | CUTANEOUS | Status: DC

## 2019-05-03 MED ORDER — ROSUVASTATIN CALCIUM 20 MG PO TABS
40.0000 mg | ORAL_TABLET | Freq: Every day | ORAL | Status: DC
  Administered 2019-05-04: 40 mg via ORAL
  Filled 2019-05-03: qty 2

## 2019-05-03 MED ORDER — MAGNESIUM SULFATE 2 GM/50ML IV SOLN: 2.0000 g | Freq: Every day | INTRAVENOUS | Status: DC | PRN

## 2019-05-03 MED ORDER — SODIUM CHLORIDE 0.9 % IV SOLN
INTRAVENOUS | Status: AC
  Filled 2019-05-03: qty 1.2

## 2019-05-03 MED ORDER — HEPARIN SODIUM (PORCINE) 1000 UNIT/ML IJ SOLN
INTRAMUSCULAR | Status: AC
  Filled 2019-05-03: qty 1

## 2019-05-03 MED ORDER — ROCURONIUM BROMIDE 10 MG/ML (PF) SYRINGE
PREFILLED_SYRINGE | INTRAVENOUS | Status: AC
  Filled 2019-05-03: qty 10

## 2019-05-03 MED ORDER — PHENYLEPHRINE HCL-NACL 10-0.9 MG/250ML-% IV SOLN
INTRAVENOUS | Status: DC | PRN
  Administered 2019-05-03: 50 ug/min via INTRAVENOUS

## 2019-05-03 MED ORDER — SODIUM CHLORIDE 0.9 % IV SOLN: INTRAVENOUS | Status: DC

## 2019-05-03 MED ORDER — ONDANSETRON HCL 4 MG/2ML IJ SOLN: 4.0000 mg | Freq: Four times a day (QID) | INTRAMUSCULAR | Status: DC | PRN

## 2019-05-03 MED ORDER — MORPHINE SULFATE (PF) 2 MG/ML IV SOLN: 2.0000 mg | INTRAVENOUS | Status: DC | PRN

## 2019-05-03 MED ORDER — PROPOFOL 10 MG/ML IV BOLUS
INTRAVENOUS | Status: DC | PRN
  Administered 2019-05-03: 180 mg via INTRAVENOUS

## 2019-05-03 MED ORDER — LABETALOL HCL 5 MG/ML IV SOLN
INTRAVENOUS | Status: DC | PRN
  Administered 2019-05-03 (×2): 5 mg via INTRAVENOUS

## 2019-05-03 MED ORDER — ROCURONIUM BROMIDE 10 MG/ML (PF) SYRINGE
PREFILLED_SYRINGE | INTRAVENOUS | Status: DC | PRN
  Administered 2019-05-03: 50 mg via INTRAVENOUS

## 2019-05-03 MED ORDER — PROMETHAZINE HCL 25 MG/ML IJ SOLN: 6.2500 mg | INTRAMUSCULAR | Status: DC | PRN

## 2019-05-03 MED ORDER — HYDRALAZINE HCL 20 MG/ML IJ SOLN: 5.0000 mg | INTRAMUSCULAR | Status: DC | PRN

## 2019-05-03 MED ORDER — ACETAMINOPHEN 500 MG PO TABS
1000.0000 mg | ORAL_TABLET | Freq: Once | ORAL | Status: AC
  Administered 2019-05-03: 1000 mg via ORAL
  Filled 2019-05-03: qty 2

## 2019-05-03 MED ORDER — GUAIFENESIN-DM 100-10 MG/5ML PO SYRP: 15.0000 mL | ORAL_SOLUTION | ORAL | Status: DC | PRN

## 2019-05-03 MED ORDER — LABETALOL HCL 5 MG/ML IV SOLN: 10.0000 mg | INTRAVENOUS | Status: DC | PRN

## 2019-05-03 MED ORDER — LOSARTAN POTASSIUM 25 MG PO TABS
25.0000 mg | ORAL_TABLET | Freq: Every day | ORAL | Status: DC
  Administered 2019-05-03 – 2019-05-04 (×2): 25 mg via ORAL
  Filled 2019-05-03 (×2): qty 1

## 2019-05-03 MED ORDER — SODIUM CHLORIDE 0.9 % IV SOLN: INTRAVENOUS | Status: AC

## 2019-05-03 MED ORDER — MONTELUKAST SODIUM 10 MG PO TABS
10.0000 mg | ORAL_TABLET | Freq: Every evening | ORAL | Status: DC
  Administered 2019-05-03: 10 mg via ORAL
  Filled 2019-05-03: qty 1

## 2019-05-03 MED ORDER — PHENYLEPHRINE 40 MCG/ML (10ML) SYRINGE FOR IV PUSH (FOR BLOOD PRESSURE SUPPORT)
PREFILLED_SYRINGE | INTRAVENOUS | Status: DC | PRN
  Administered 2019-05-03 (×2): 80 ug via INTRAVENOUS

## 2019-05-03 MED ORDER — FENTANYL CITRATE (PF) 100 MCG/2ML IJ SOLN: 25.0000 ug | INTRAMUSCULAR | Status: DC | PRN

## 2019-05-03 MED ORDER — LIDOCAINE 2% (20 MG/ML) 5 ML SYRINGE
INTRAMUSCULAR | Status: AC
  Filled 2019-05-03: qty 5

## 2019-05-03 MED ORDER — METOPROLOL TARTRATE 5 MG/5ML IV SOLN: 2.0000 mg | INTRAVENOUS | Status: DC | PRN

## 2019-05-03 MED ORDER — SUCCINYLCHOLINE CHLORIDE 200 MG/10ML IV SOSY
PREFILLED_SYRINGE | INTRAVENOUS | Status: AC
  Filled 2019-05-03: qty 10

## 2019-05-03 MED ORDER — DEXAMETHASONE SODIUM PHOSPHATE 10 MG/ML IJ SOLN
INTRAMUSCULAR | Status: AC
  Filled 2019-05-03: qty 1

## 2019-05-03 MED ORDER — OXYCODONE-ACETAMINOPHEN 5-325 MG PO TABS
1.0000 | ORAL_TABLET | ORAL | Status: DC | PRN
  Administered 2019-05-03: 1 via ORAL
  Filled 2019-05-03: qty 2
  Filled 2019-05-03: qty 1

## 2019-05-03 MED ORDER — PHENYLEPHRINE HCL-NACL 10-0.9 MG/250ML-% IV SOLN: INTRAVENOUS | Status: DC | PRN

## 2019-05-03 MED ORDER — DEXAMETHASONE SODIUM PHOSPHATE 4 MG/ML IJ SOLN
INTRAMUSCULAR | Status: DC | PRN
  Administered 2019-05-03: 4 mg via INTRAVENOUS

## 2019-05-03 MED ORDER — DOCUSATE SODIUM 100 MG PO CAPS
100.0000 mg | ORAL_CAPSULE | Freq: Every day | ORAL | Status: DC
  Filled 2019-05-03: qty 1

## 2019-05-03 MED ORDER — 0.9 % SODIUM CHLORIDE (POUR BTL) OPTIME
TOPICAL | Status: DC | PRN
  Administered 2019-05-03: 1000 mL

## 2019-05-03 MED ORDER — SUCCINYLCHOLINE CHLORIDE 200 MG/10ML IV SOSY
PREFILLED_SYRINGE | INTRAVENOUS | Status: DC | PRN
  Administered 2019-05-03: 180 mg via INTRAVENOUS

## 2019-05-03 MED ORDER — ONDANSETRON HCL 4 MG/2ML IJ SOLN
INTRAMUSCULAR | Status: DC | PRN
  Administered 2019-05-03: 4 mg via INTRAVENOUS

## 2019-05-03 MED ORDER — ALBUTEROL SULFATE HFA 108 (90 BASE) MCG/ACT IN AERS
INHALATION_SPRAY | RESPIRATORY_TRACT | Status: AC
  Filled 2019-05-03: qty 6.7

## 2019-05-03 MED ORDER — CEFAZOLIN SODIUM-DEXTROSE 2-4 GM/100ML-% IV SOLN
INTRAVENOUS | Status: AC
  Filled 2019-05-03: qty 100

## 2019-05-03 MED ORDER — ROFLUMILAST 500 MCG PO TABS
250.0000 ug | ORAL_TABLET | Freq: Every evening | ORAL | Status: DC
  Administered 2019-05-03: 250 ug via ORAL
  Filled 2019-05-03 (×2): qty 1

## 2019-05-03 MED ORDER — EPHEDRINE SULFATE-NACL 50-0.9 MG/10ML-% IV SOSY
PREFILLED_SYRINGE | INTRAVENOUS | Status: DC | PRN
  Administered 2019-05-03: 5 mg via INTRAVENOUS

## 2019-05-03 MED ORDER — CEFAZOLIN SODIUM-DEXTROSE 2-4 GM/100ML-% IV SOLN
2.0000 g | Freq: Three times a day (TID) | INTRAVENOUS | Status: AC
  Administered 2019-05-03 – 2019-05-04 (×2): 2 g via INTRAVENOUS
  Filled 2019-05-03 (×2): qty 100

## 2019-05-03 MED ORDER — IODIXANOL 320 MG/ML IV SOLN
INTRAVENOUS | Status: DC | PRN
  Administered 2019-05-03: 63.4 mL via INTRA_ARTERIAL

## 2019-05-03 MED ORDER — ALBUTEROL SULFATE HFA 108 (90 BASE) MCG/ACT IN AERS: 2.0000 | INHALATION_SPRAY | Freq: Four times a day (QID) | RESPIRATORY_TRACT | Status: DC | PRN

## 2019-05-03 MED ORDER — MIDAZOLAM HCL 2 MG/2ML IJ SOLN
INTRAMUSCULAR | Status: AC
  Filled 2019-05-03: qty 2

## 2019-05-03 MED ORDER — PROPOFOL 10 MG/ML IV BOLUS
INTRAVENOUS | Status: AC
  Filled 2019-05-03: qty 20

## 2019-05-03 MED ORDER — CELECOXIB 200 MG PO CAPS
400.0000 mg | ORAL_CAPSULE | Freq: Once | ORAL | Status: AC
  Administered 2019-05-03: 400 mg via ORAL
  Filled 2019-05-03: qty 2

## 2019-05-03 SURGICAL SUPPLY — 60 items
ADH SKN CLS APL DERMABOND .7 (GAUZE/BANDAGES/DRESSINGS) ×4
BLADE CLIPPER SURG (BLADE) ×3 IMPLANT
CANISTER SUCT 3000ML PPV (MISCELLANEOUS) ×3 IMPLANT
CATH BEACON 5.038 65CM KMP-01 (CATHETERS) ×3 IMPLANT
CATH OMNI FLUSH .035X70CM (CATHETERS) ×3 IMPLANT
COVER WAND RF STERILE (DRAPES) ×2 IMPLANT
DERMABOND ADVANCED (GAUZE/BANDAGES/DRESSINGS) ×2
DERMABOND ADVANCED .7 DNX12 (GAUZE/BANDAGES/DRESSINGS) ×2 IMPLANT
DEVICE CLOSURE PERCLS PRGLD 6F (VASCULAR PRODUCTS) IMPLANT
DEVICE TORQUE H2O (MISCELLANEOUS) IMPLANT
DRAPE ZERO GRAVITY STERILE (DRAPES) ×3 IMPLANT
DRSG TEGADERM 2-3/8X2-3/4 SM (GAUZE/BANDAGES/DRESSINGS) ×6 IMPLANT
DRYSEAL FLEXSHEATH 12FR 33CM (SHEATH) ×1
DRYSEAL FLEXSHEATH 18FR 33CM (SHEATH) ×1
ELECT CAUTERY BLADE 6.4 (BLADE) ×2 IMPLANT
ELECT REM PT RETURN 9FT ADLT (ELECTROSURGICAL) ×6
ELECTRODE REM PT RTRN 9FT ADLT (ELECTROSURGICAL) ×4 IMPLANT
EXCLUDER TNK LEG 28MX12X18 (Endovascular Graft) IMPLANT
EXCLUDER TRUNK LEG 28MX12X18 (Endovascular Graft) ×3 IMPLANT
EXTENDER ENDOPROSTHESIS 10X7 (Endovascular Graft) ×1 IMPLANT
GAUZE SPONGE 2X2 8PLY STRL LF (GAUZE/BANDAGES/DRESSINGS) ×4 IMPLANT
GLOVE BIOGEL PI IND STRL 7.5 (GLOVE) ×2 IMPLANT
GLOVE BIOGEL PI INDICATOR 7.5 (GLOVE) ×1
GLOVE SURG SS PI 7.5 STRL IVOR (GLOVE) ×3 IMPLANT
GOWN STRL REUS W/ TWL LRG LVL3 (GOWN DISPOSABLE) ×4 IMPLANT
GOWN STRL REUS W/ TWL XL LVL3 (GOWN DISPOSABLE) ×4 IMPLANT
GOWN STRL REUS W/TWL LRG LVL3 (GOWN DISPOSABLE) ×6
GOWN STRL REUS W/TWL XL LVL3 (GOWN DISPOSABLE) ×6
GRAFT BALLN CATH 65CM (STENTS) ×2 IMPLANT
GUIDEWIRE ANGLED .035X150CM (WIRE) IMPLANT
KIT BASIN OR (CUSTOM PROCEDURE TRAY) ×3 IMPLANT
KIT TURNOVER KIT B (KITS) ×3 IMPLANT
LEG CONTRALATERAL 16X14.5X14 (Vascular Products) ×1 IMPLANT
NS IRRIG 1000ML POUR BTL (IV SOLUTION) ×3 IMPLANT
PACK ENDOVASCULAR (PACKS) ×3 IMPLANT
PAD ARMBOARD 7.5X6 YLW CONV (MISCELLANEOUS) ×6 IMPLANT
PENCIL BUTTON HOLSTER BLD 10FT (ELECTRODE) ×3 IMPLANT
PERCLOSE PROGLIDE 6F (VASCULAR PRODUCTS) ×15
SET MICROPUNCTURE 5F STIFF (MISCELLANEOUS) ×4 IMPLANT
SHEATH BRITE TIP 8FR 23CM (SHEATH) ×3 IMPLANT
SHEATH DRYSEAL FLEX 12FR 33CM (SHEATH) IMPLANT
SHEATH DRYSEAL FLEX 18FR 33CM (SHEATH) IMPLANT
SHEATH PINNACLE 8F 10CM (SHEATH) ×3 IMPLANT
SHIELD RADPAD (MISCELLANEOUS) ×2 IMPLANT
SPONGE GAUZE 2X2 STER 10/PKG (GAUZE/BANDAGES/DRESSINGS) ×2
STENT GRAFT BALLN CATH 65CM (STENTS) ×1
STOPCOCK MORSE 400PSI 3WAY (MISCELLANEOUS) ×3 IMPLANT
SUT PROLENE 5 0 C 1 24 (SUTURE) IMPLANT
SUT VIC AB 2-0 CT1 27 (SUTURE)
SUT VIC AB 2-0 CT1 TAPERPNT 27 (SUTURE) IMPLANT
SUT VIC AB 3-0 SH 27 (SUTURE)
SUT VIC AB 3-0 SH 27X BRD (SUTURE) IMPLANT
SUT VICRYL 4-0 PS2 18IN ABS (SUTURE) IMPLANT
SYR 20ML LL LF (SYRINGE) ×3 IMPLANT
TOWEL GREEN STERILE (TOWEL DISPOSABLE) ×3 IMPLANT
TRAY FOLEY MTR SLVR 16FR STAT (SET/KITS/TRAYS/PACK) ×3 IMPLANT
TUBING HIGH PRESSURE 120CM (CONNECTOR) ×3 IMPLANT
WIRE AMPLATZ SS-J .035X180CM (WIRE) ×6 IMPLANT
WIRE BENTSON .035X145CM (WIRE) ×6 IMPLANT
WIRE TORQFLEX AUST .018X40CM (WIRE) ×1 IMPLANT

## 2019-05-03 NOTE — Op Note (Signed)
Patient name: LAVARR DURNIN MRN: [id number] DOB: 23-Aug-1950 Sex: male  05/03/2019 Pre-operative Diagnosis: AAA Post-operative diagnosis:  Same Surgeon:  Annamarie Major Assistants:  Arlee Muslim Procedure:   #1:  Endovascular repair of abdominal aortic aneurysm   #2:  U/s guided bilateral femoral artery access   #3:  Abdominal aortogram   #4:  Catheter in aorta x 2 Anesthesia:  General Blood Loss:  minimal Specimens:  None  Findings:  Complete exclusion,  Occluded right internal iliac artery  Devices Used:  Main body was primary right GORE 28x12x18,  Ipsilateral right 10x7, contralateral left 14x14  Indications:  The patient has a 5.1 cm AAA by CT scan.  He comes in today for repair  Procedure:  The patient was identified in the holding area and taken to [facility] [address]  The patient was then placed supine on the table. general anesthesia was administered.  The patient was prepped and draped in the usual sterile fashion.  A time out was called and antibiotics were administered.  Ultrasound was used to evaluate bilateral common femoral arteries which were calcified but patent.  A #11 blade was used to make a skin nick bilaterally.  Bilateral common femoral arteries were cannulated under ultrasound guidance with a micropuncture needle.  An 018 wire was advanced without resistance and a micropuncture sheath was placed.  Next, a Bentson wire was inserted and the subcutaneous tract was dilated with an 8 [place] dilator.  Pro-glide devices were deployed at the 11:00 and 1 o'clock position for preclosure, and 8 [place] sheaths were placed bilaterally.  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  Next, a Amplatz superstiff wire was inserted up the right side and the 8 French sheath was exchanged out for an 18 [place] sheath.  The patient was then fully heparinized.  A pigtail catheter was advanced up the left side and  positioned at the level of L2.  The main body device was prepared on the back table.  This was a Gore 28 x 12 x 18 device.  It was then inserted into the aorta and an abdominal aortogram was performed with 15 degrees of cranial.  This located the renal arteries.  The device was then deployed landing at the level of the lowest right renal artery.  An additional aortogram was performed confirming that the device was in good position.  Next, the contralateral gate was cannulated with a Berenstein 2 catheter and a Bentson wire.  The Berenstein 2 catheter was removed and a pigtail catheter was inserted which was able to be freely rotated within the main body, confirming successful cannulation.  Next, the image detector was rotated to a right anterior oblique position and a retrograde injection was performed through the left groin locating the left hypogastric artery.  An Amplatz superstiff wire was then inserted and the Omni Flush catheter was removed.  The left limb was then prepared on the back table.  This was a Gore 14 x 14 device.  It was then inserted through a 12 [place] sheath.  It was deployed landing just proximal to the left hypogastric artery.  Next, the remaining portion of the ipsilateral limb was deployed.  The image detector was rotated to a left anterior oblique position and a retrograde injection was performed through the sheath locating the level of distal deployment.  Through the sheath in the right groin, a Gore 10 x 7 device was  inserted with appropriate overlap and then deployed.  Next, a MOB balloon was used to mold the proximal and distal attachment sites as well as device overlap.  A completion arteriogram was then performed which showed successful exclusion of the aneurysm with no evidence of endoleak.  Bentson wires were then inserted bilaterally and the sheaths were removed.  The Pro-glide devices were used to close the arteriotomy site.  50 mg of protamine was then administered.  Manual  pressure was held for hemostasis.  Cautery was used on the subcutaneous tissue and Dermabond was applied.  There were no immediate complications.  The patient was successfully extubated taken recovery in stable condition.   Disposition: To PACU stable.   Theotis Burrow, M.D., [facility] [place] Office: [phone] Pager:  [phone]

## 2019-05-03 NOTE — Discharge Instructions (Signed)
   [facility] [place]   Discharge Instructions  Endovascular Aortic Aneurysm Repair  Please refer to the following instructions for your post-procedure care. Your surgeon or Physician Assistant will discuss any changes with you.  Activity  You are encouraged to walk as much as you can. You can slowly return to normal activities but must avoid strenuous activity and heavy lifting until your doctor tells you it's OK. Avoid activities such as vacuuming or swinging a gold club. It is normal to feel tired for several weeks after your surgery. Do not drive until your doctor gives the OK and you are no longer taking prescription pain medications. It is also normal to have difficulty with sleep habits, eating, and bowel movements after surgery. These will go away with time.  Bathing/Showering  You may shower after you go home. If you have an incision, do not soak in a bathtub, hot tub, or swim until the incision heals completely.  Incision Care  Shower every day. Clean your incision with mild soap and water. Pat the area dry with a clean towel. You do not need a bandage unless otherwise instructed. Do not apply any ointments or creams to your incision. If you clothing is irritating, you may cover your incision with a dry gauze pad.  Diet  Resume your normal diet. There are no special food restrictions following this procedure. A low fat/low cholesterol diet is recommended for all patients with vascular disease. In order to heal from your surgery, it is CRITICAL to get adequate nutrition. Your body requires vitamins, minerals, and protein. Vegetables are the best source of vitamins and minerals. Vegetables also provide the perfect balance of protein. Processed food has little nutritional value, so try to avoid this.  Medications  Resume taking all of your medications unless your doctor or nurse practitioner tells you not to. If your incision is causing pain, you may take  over-the-counter pain relievers such as acetaminophen (Tylenol). If you were prescribed a stronger pain medication, please be aware these medications can cause nausea and constipation. Prevent nausea by taking the medication with a snack or meal. Avoid constipation by drinking plenty of fluids and eating foods with a high amount of fiber, such as fruits, vegetables, and grains. Do not take Tylenol if you are taking prescription pain medications.   Follow up  Our office will schedule a follow-up appointment with a C.T. scan 3-4 weeks after your surgery.  Please call us immediately for any of the following conditions  Severe or worsening pain in your legs or feet or in your abdomen back or chest. Increased pain, redness, drainage (pus) from your incision sit. Increased abdominal pain, bloating, nausea, vomiting or persistent diarrhea. Fever of 101 degrees or higher. Swelling in your leg (s),  Reduce your risk of vascular disease  Stop smoking. If you would like help call [facility] at 1-800-QUIT-NOW (1-800-784-8669) or [place] at [phone]. Manage your cholesterol Maintain a desired weight Control your diabetes Keep your blood pressure down  If you have questions, please call the office at [phone].   

## 2019-05-03 NOTE — Progress Notes (Signed)
Pt arrived to 4e from PACU. Pt and pt's wife oriented to room and staff. Bilateral groin sites level 0. Palpable pedal pulses bilaterally. Pt instructed on bedrest for 4 hours. Call light within reach. Vitals obtained and telemetry box applied. CCMD notified x2 verifiers.

## 2019-05-03 NOTE — Anesthesia Postprocedure Evaluation (Signed)
Anesthesia Post Note  Patient: Melvin Brewer  Procedure(s) Performed: ABDOMINAL AORTIC ENDOVASCULAR STENT GRAFT (N/A Abdomen) Ultrasound Guidance For Vascular Access, bilateral femoral arteries (Bilateral Groin)     Patient location during evaluation: PACU Anesthesia Type: General Level of consciousness: sedated Pain management: pain level controlled Vital Signs Assessment: post-procedure vital signs reviewed and stable Respiratory status: spontaneous breathing and respiratory function stable Cardiovascular status: stable Postop Assessment: no apparent nausea or vomiting Anesthetic complications: no    Last Vitals:  Vitals:   05/03/19 1042 05/03/19 1100  BP: (!) 125/99 140/68  Pulse:  (!) 57  Resp:  16  Temp:  36.7 C  SpO2: 100% 98%    Last Pain:  Vitals:   05/03/19 1100  TempSrc: Oral  PainSc:                  [place]

## 2019-05-03 NOTE — Interval H&P Note (Signed)
History and Physical Interval Note:  05/03/2019 7:29 AM  Melvin Brewer  has presented today for surgery, with the diagnosis of abdominal aortic aneurysm.  The various methods of treatment have been discussed with the patient and family. After consideration of risks, benefits and other options for treatment, the patient has consented to  Procedure(s): ABDOMINAL AORTIC ENDOVASCULAR STENT GRAFT (N/A) as a surgical intervention.  The patient's history has been reviewed, patient examined, no change in status, stable for surgery.  I have reviewed the patient's chart and labs.  Questions were answered to the patient's satisfaction.     Annamarie Major

## 2019-05-03 NOTE — Transfer of Care (Signed)
Immediate Anesthesia Transfer of Care Note  Patient: Melvin Brewer  Procedure(s) Performed: ABDOMINAL AORTIC ENDOVASCULAR STENT GRAFT (N/A Abdomen) Ultrasound Guidance For Vascular Access, bilateral femoral arteries (Bilateral Groin)  Patient Location: PACU  Anesthesia Type:General  Level of Consciousness: awake, oriented and patient cooperative  Airway & Oxygen Therapy: Patient Spontanous Breathing and Patient connected to face mask oxygen  Post-op Assessment: Report given to RN and Post -op Vital signs reviewed and stable  Post vital signs: Reviewed  Last Vitals:  Vitals Value Taken Time  BP 134/74 05/03/19 0946  Temp    Pulse 57 05/03/19 0949  Resp 15 05/03/19 0949  SpO2 96 % 05/03/19 0949  Vitals shown include unvalidated device data.  Last Pain:  Vitals:   05/03/19 0629  TempSrc: Oral  PainSc:       Patients Stated Pain Goal: 4 ([id number])  Complications: No apparent anesthesia complications

## 2019-05-03 NOTE — Anesthesia Procedure Notes (Addendum)
Procedure Name: Intubation Date/Time: 05/03/2019 7:46 AM Performed by: Jenne Campus, CRNA Pre-anesthesia Checklist: Patient identified, Emergency Drugs available, Suction available and Patient being monitored Patient Re-evaluated:Patient Re-evaluated prior to induction Oxygen Delivery Method: Circle System Utilized Preoxygenation: Pre-oxygenation with 100% oxygen Induction Type: IV induction and Cricoid Pressure applied Laryngoscope Size: Miller and 3 Grade View: Grade I Tube type: Oral Tube size: 7.5 mm Number of attempts: 1 Airway Equipment and Method: Stylet Placement Confirmation: ETT inserted through vocal cords under direct vision,  positive ETCO2 and breath sounds checked- equal and bilateral Secured at: 23 cm Tube secured with: Tape Dental Injury: Teeth and Oropharynx as per pre-operative assessment

## 2019-05-03 NOTE — Anesthesia Procedure Notes (Signed)
Arterial Line Insertion Start/End1/22/2021 7:00 AM Performed by: Jenne Campus, CRNA, CRNA  Patient location: Pre-op. Preanesthetic checklist: patient identified, IV checked, risks and benefits discussed, surgical consent, monitors and equipment checked, pre-op evaluation, timeout performed and anesthesia consent Lidocaine 1% used for infiltration Right, radial was placed Catheter size: 20 G Hand hygiene performed  and maximum sterile barriers used   Attempts: 1 Procedure performed without using ultrasound guided technique. Following insertion, dressing applied and Biopatch. Post procedure assessment: normal  Patient tolerated the procedure well with no immediate complications.

## 2019-05-04 LAB — CBC
HCT: 36.4 % — ABNORMAL LOW (ref 39.0–52.0)
Hemoglobin: 11.9 g/dL — ABNORMAL LOW (ref 13.0–17.0)
MCH: 32.1 pg (ref 26.0–34.0)
MCHC: 32.7 g/dL (ref 30.0–36.0)
MCV: 98.1 fL (ref 80.0–100.0)
Platelets: 173 10*3/uL (ref 150–400)
RBC: 3.71 MIL/uL — ABNORMAL LOW (ref 4.22–5.81)
RDW: 14.3 % (ref 11.5–15.5)
WBC: 11.8 10*3/uL — ABNORMAL HIGH (ref 4.0–10.5)
nRBC: 0 % (ref 0.0–0.2)

## 2019-05-04 LAB — BASIC METABOLIC PANEL
Anion gap: 7 (ref 5–15)
BUN: 13 mg/dL (ref 8–23)
CO2: 23 mmol/L (ref 22–32)
Calcium: 8.5 mg/dL — ABNORMAL LOW (ref 8.9–10.3)
Chloride: 109 mmol/L (ref 98–111)
Creatinine, Ser: 1.13 mg/dL (ref 0.61–1.24)
GFR calc Af Amer: >60 mL/min (ref >60)
GFR calc non Af Amer: >60 mL/min (ref >60)
Glucose, Bld: 114 mg/dL — ABNORMAL HIGH (ref 70–99)
Potassium: 4.1 mmol/L (ref 3.5–5.1)
Sodium: 139 mmol/L (ref 135–145)

## 2019-05-04 MED ORDER — HYDROCODONE-ACETAMINOPHEN 5-325 MG PO TABS: 1.0000 | ORAL_TABLET | ORAL | 0 refills | Status: DC | PRN

## 2019-05-04 NOTE — Progress Notes (Addendum)
  Progress Note    05/04/2019 7:30 AM 1 Day Post-Op  Subjective: Without complaints this morning.  Foley catheter just removed and he has yet voided.  He denies back pain, chest pain, lower extremity pain or weakness.  No nausea or vomiting.  Tolerating diet  S/p EVAR, (bilateral FA access)  Vitals:   05/04/19 0000 05/04/19 0400  BP: (!) 124/58 129/74  Pulse: 64 (!) 58  Resp: 15 14  Temp: 97.6 F (36.4 C)   SpO2: 93% 96%    Physical Exam: Cardiac: Heart rate and rhythm are regular Lungs: Clear to auscultation bilaterally Extremities: Lower extremities well perfused with active range of motion.  Palpable dorsalis pedis pulses.  Both groins are soft without evidence of hematoma.  Mild ecchymosis on the right. Abdomen: Soft, nondistended, active bowel sounds  CBC    Component Value Date/Time   WBC 11.8 (H) 05/04/2019 0505   RBC 3.71 (L) 05/04/2019 0505   HGB 11.9 (L) 05/04/2019 0505   HCT 36.4 (L) 05/04/2019 0505   PLT 173 05/04/2019 0505   MCV 98.1 05/04/2019 0505   MCH 32.1 05/04/2019 0505   MCHC 32.7 05/04/2019 0505   RDW 14.3 05/04/2019 0505    BMET    Component Value Date/Time   NA 139 05/04/2019 0505   K 4.1 05/04/2019 0505   CL 109 05/04/2019 0505   CO2 23 05/04/2019 0505   GLUCOSE 114 (H) 05/04/2019 0505   BUN 13 05/04/2019 0505   CREATININE 1.13 05/04/2019 0505   CALCIUM 8.5 (L) 05/04/2019 0505   GFRNONAA >60 05/04/2019 0505   GFRAA >60 05/04/2019 0505     Intake/Output Summary (Last 24 hours) at 05/04/2019 0730 Last data filed at 05/03/2019 [id number] Gross per 24 hour  Intake 1400 ml  Output 200 ml  Net 1200 ml    HOSPITAL MEDICATIONS Scheduled Meds: . aspirin EC  81 mg Oral QPM  . Chlorhexidine Gluconate Cloth  6 each Topical Daily  . docusate sodium  100 mg Oral Daily  . losartan  25 mg Oral Daily  . metoprolol tartrate  25 mg Oral BID  . montelukast  10 mg Oral QPM  . pantoprazole  40 mg Oral Daily  . roflumilast  250 mcg Oral QPM  .  rosuvastatin  40 mg Oral Daily  . umeclidinium bromide  1 puff Inhalation Daily  . valACYclovir  500 mg Oral QPM   Continuous Infusions: . sodium chloride    . sodium chloride Stopped (05/03/19 1843)  . magnesium sulfate bolus IVPB     PRN Meds:.sodium chloride, acetaminophen **OR** acetaminophen, albuterol, alum & mag hydroxide-simeth, guaiFENesin-dextromethorphan, hydrALAZINE, labetalol, magnesium sulfate bolus IVPB, metoprolol tartrate, morphine injection, ondansetron, oxyCODONE-acetaminophen, phenol, potassium chloride  Assessment:  69 y.o. male is s/p: Endovascular repair of abdominal aortic aneurysm.  Stable hemoglobin and no symptoms of ischemia. Stable postop EVAR.  1 Day Post-Op  Plan: Plan discharge once he is voiding spontaneously  -DVT prophylaxis: SCDs  Risa Grill, PA-C [facility] [phone] 05/04/2019  7:30 AM   Agree with above.  Pain controlled. No hematoma  D/c home  Ruta Hinds, MD [facility] Office: [phone]

## 2019-05-04 NOTE — Discharge Summary (Signed)
EV[facility] Discharge Summary   Melvin Brewer 1951-01-04 69 y.o. male  MRN: [id number]  Admission Date: 05/03/2019  Discharge Date: 05/04/2019  Physician: Serafina Mitchell, MD  Admission Diagnosis: Abdominal aortic aneurysm (AAA) [facility]) [I71.4] S/P AAA repair [id number], Z86.79]  Discharge Day services:     Physical Exam: Vitals:   05/04/19 0400 05/04/19 0850  BP: 129/74 124/71  Pulse: (!) 58 79  Resp: 14 18  Temp:  98.1 F (36.7 C)  SpO2: 96% 95%   General: Well-developed, well-nourished in no apparent distress Cardiac: Rate and rhythm are regular Lungs: Clear to auscultation bilaterally Extremities: No extremity pain, back pain, weakness Abdomen: Soft, nondistended, active bowel sounds Neurologic: Intact, alert and oriented x4  Hospital Course:  The patient was admitted to the hospital and taken to the operating room on 05/03/2019 and underwent:  #1:  Endovascular repair of abdominal aortic aneurysm                         #2:  U/s guided bilateral femoral artery access                         #3:  Abdominal aortogram                         #4:  Catheter in aorta x 2    The pt tolerated the procedure well and was transported to the PACU in excellent condition.   He remained hemodynamically stable.  On post procedure day 1, he had no evidence of ischemia, moving all extremities well, tolerating diet and was able to void spontaneously.  Femoral artery access points were without hematoma.  Hemoglobin stable.  The remainder of the hospital course consisted of increasing mobilization and increasing intake of solids without difficulty.  CBC    Component Value Date/Time   WBC 11.8 (H) 05/04/2019 0505   RBC 3.71 (L) 05/04/2019 0505   HGB 11.9 (L) 05/04/2019 0505   HCT 36.4 (L) 05/04/2019 0505   PLT 173 05/04/2019 0505   MCV 98.1 05/04/2019 0505   MCH 32.1 05/04/2019 0505   MCHC 32.7 05/04/2019 0505   RDW 14.3 05/04/2019 0505    BMET    Component Value  Date/Time   NA 139 05/04/2019 0505   K 4.1 05/04/2019 0505   CL 109 05/04/2019 0505   CO2 23 05/04/2019 0505   GLUCOSE 114 (H) 05/04/2019 0505   BUN 13 05/04/2019 0505   CREATININE 1.13 05/04/2019 0505   CALCIUM 8.5 (L) 05/04/2019 0505   GFRNONAA >60 05/04/2019 0505   GFRAA >60 05/04/2019 0505         Discharge Diagnosis:  Abdominal aortic aneurysm (AAA) ([place]) [I71.4] S/P AAA repair [id number], Z86.79]  Secondary Diagnosis: Patient Active Problem List   Diagnosis Date Noted  . Abdominal aortic aneurysm (AAA) ([place]) 05/03/2019  . S/P AAA repair 05/03/2019  . 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 Cervical herniated disc 06/20/2013   Past Medical History:  Diagnosis Date  . AAA (abdominal aortic aneurysm) ([place])   . Abdominal aortic aneurysm (AAA) ([place])   . Allergic rhinitis   . Anginal pain ([place])   . Centrilobular emphysema ([place])   . Cervical spine degeneration   . CHF (congestive heart failure) ([place])   . COPD (chronic obstructive pulmonary disease) ([place])   . Coronary artery disease   . Diabetes mellitus  without complication ([place])   . Diverticulitis   . DJD (degenerative joint disease)   . Early cataract   . Eczema   . GERD (gastroesophageal reflux disease)   . H/O hiatal hernia    AGE 29 S  NO MEDS  . Hemorrhoids   . Herpes simplex infection   . Hypercholesterolemia   . Hyperglycemia   . Hyperlipidemia   . Hypertension   . Macular degeneration    right eye  . Obesity   . Shortness of breath    W/ EXERTION   . Wears glasses   . Wears partial dentures      Allergies as of 05/04/2019      Reactions   Atorvastatin Other (See Comments)   Leg cramps   Sulfa Antibiotics Rash   Sun Rash       Medication List    TAKE these medications   albuterol 108 (90 Base) MCG/ACT inhaler Commonly known as: VENTOLIN HFA Inhale 2 puffs into the lungs every 6 (six) hours as needed for wheezing or shortness of breath.   albuterol (2.5 MG/3ML) 0.083%  nebulizer solution Commonly known as: PROVENTIL Inhale 2.5 mg into the lungs every 6 (six) hours as needed for wheezing or shortness of breath.   aspirin EC 81 MG tablet Take 81 mg by mouth every evening.   azelastine 0.1 % nasal spray Commonly known as: ASTELIN Place 1 spray into both nostrils daily as needed for allergies. Use in each nostril as directed   Daliresp 250 MCG Tabs Generic drug: Roflumilast Take 250 mcg by mouth every evening.   Denta 5000 Plus 1.1 % Crea dental cream Generic drug: sodium fluoride Take 1 application by mouth 2 (two) times daily. Brush teeth for at least 1 min   fexofenadine 180 MG tablet Commonly known as: ALLEGRA Take 180 mg by mouth daily.   fluticasone 50 MCG/ACT nasal spray Commonly known as: FLONASE Place 2 sprays into both nostrils daily.   furosemide 40 MG tablet Commonly known as: LASIX TAKE 1 TABLET BY MOUTH EVERY DAY What changed: when to take this   HYDROcodone-acetaminophen 5-325 MG tablet Commonly known as: NORCO/VICODIN Take 1 tablet by mouth every 4 (four) hours as needed for moderate pain.   losartan 25 MG tablet Commonly known as: COZAAR Take 25 mg by mouth daily.   Melatonin 3 MG Caps Take 3 mg by mouth at bedtime as needed (sleep).   metFORMIN 500 MG tablet Commonly known as: GLUCOPHAGE Take 500 mg by mouth daily.   metoprolol tartrate 25 MG tablet Commonly known as: LOPRESSOR Take 1 tablet (25 mg total) by mouth 2 (two) times daily.   montelukast 10 MG tablet Commonly known as: SINGULAIR Take 10 mg by mouth every evening.   Potassium Chloride ER 20 MEQ Tbcr Take 20 mEq by mouth daily. What changed:   when to take this  additional instructions   rosuvastatin 40 MG tablet Commonly known as: CRESTOR Take 40 mg by mouth daily.   sildenafil 20 MG tablet Commonly known as: REVATIO Take 20-100 mg by mouth daily as needed for erectile dysfunction.   Spiriva Respimat 2.5 MCG/ACT Aers Generic drug:  Tiotropium Bromide Monohydrate Inhale 2 puffs into the lungs daily.   Symbicort 160-4.5 MCG/ACT inhaler Generic drug: budesonide-formoterol Inhale 2 puffs into the lungs 2 (two) times a day.   valACYclovir 500 MG tablet Commonly known as: VALTREX Take 500 mg by mouth every evening.   Vitamin D3 50 MCG (2000 UT) Tabs  Take 2,000 Units by mouth daily.       Discharge Instructions:   [facility]  Discharge Instructions Endovascular Aortic Aneurysm Repair  Please refer to the following instructions for your post-procedure care. Your surgeon or Physician Assistant will discuss any changes with you.  Activity  You are encouraged to walk as much as you can. You can slowly return to normal activities but must avoid strenuous activity and heavy lifting until your doctor tells you it's OK. Avoid activities such as vacuuming or swinging a gold club. It is normal to feel tired for several weeks after your surgery. Do not drive until your doctor gives the OK and you are no longer taking prescription pain medications. It is also normal to have difficulty with sleep habits, eating, and bowel movements after surgery. These will go away with time.  Bathing/Showering  You may shower after you go home. If you have an incision, do not soak in a bathtub, hot tub, or swim until the incision heals completely.  Incision Care  Shower every day. Clean your incision with mild soap and water. Pat the area dry with a clean towel. You do not need a bandage unless otherwise instructed. Do not apply any ointments or creams to your incision. If you clothing is irritating, you may cover your incision with a dry gauze pad.  Diet  Resume your normal diet. There are no special food restrictions following this procedure. A low fat/low cholesterol diet is recommended for all patients with vascular disease. In order to heal from your surgery, it is CRITICAL to get adequate nutrition.  Your body requires vitamins, minerals, and protein. Vegetables are the best source of vitamins and minerals. Vegetables also provide the perfect balance of protein. Processed food has little nutritional value, so try to avoid this.  Medications  Resume taking all of your medications unless your doctor or Physician Assistnat tells you not to. If your incision is causing pain, you may take over-the-counter pain relievers such as acetaminophen (Tylenol). If you were prescribed a stronger pain medication, please be aware these medications can cause nausea and constipation. Prevent nausea by taking the medication with a snack or meal. Avoid constipation by drinking plenty of fluids and eating foods with a high amount of fiber, such as fruits, vegetables, and grains. Do not take Tylenol if you are taking prescription pain medications.   Follow up  [place] office will schedule a follow-up appointment with a C.T. scan 3-4 weeks after your surgery.  Please call us immediately for any of the following conditions  . Severe or worsening pain in your legs or feet or in your abdomen back or chest. . Increased pain, redness, drainage (pus) from your incision sit. . Increased abdominal pain, bloating, nausea, vomiting or persistent diarrhea. . Fever of 101 degrees or higher. . Swelling in your leg (s), .  Reduce your risk of vascular disease  .Stop smoking. If you would like help call [facility] at 1-800-QUIT-NOW (620)133-6451) or [place] at 3258878915. .Manage your cholesterol .Maintain a desired weight .Control your diabetes .Keep your blood pressure down  If you have questions, please call the office at [phone].    Prescriptions given: Hydrocodone 5/325#6 No Refill  Disposition: Home with wife  Patient's condition: is Excellent  Follow up: 1. Dr. Trula Slade in 4 weeks with CTA protocol   Barbie Banner, PA-C [facility] [phone] 05/04/2019  10:14 AM   -  For VQI Registry use -  Post-op:  Time to Extubation: [x]  In OR, [ ]  < 12 hrs, [ ]  12-24 hrs, [ ]  >=24 hrs Vasopressors Req. Post-op: No MI: No., [ ]  Troponin only, [ ]  EKG or Clinical New Arrhythmia: No CHF: No ICU Stay: none day in Transfusion: No     If yes, 0 units given  Complications: Resp failure: No., [ ]  Pneumonia, [ ]  Ventilator Chg in renal function: No., [ ]  Inc. Cr > 0.5, [ ]  Temp. Dialysis,  [ ]  Permanent dialysis Leg ischemia: No., no Surgery needed, [ ]  Yes, Surgery needed,  [ ]  Amputation Bowel ischemia: No., [ ]  Medical Rx, [ ]  Surgical Rx Wound complication: No., [ ]  Superficial separation/infection, [ ]  Return to OR Return to OR: No  Return to OR for bleeding: No Stroke: No., [ ]  Minor, [ ]  Major  Discharge medications: Statin use:  Yes  ASA use:  Yes  Plavix use:  No  Beta blocker use:  Yes  ARB use:  Yes ACEI use:  No CCB use:  No

## 2019-05-04 NOTE — Progress Notes (Signed)
Discharge instructions given to patient. IV removed, clean and intact. Medications and wound care reviewed. All questions answered. Pt escorted home by wife.  Scotty Pinder R Johannah Rozas, RN  

## 2019-05-06 ENCOUNTER — Encounter: Payer: Self-pay | Admitting: *Deleted

## 2019-05-07 ENCOUNTER — Other Ambulatory Visit: Payer: Self-pay

## 2019-05-07 DIAGNOSIS — I998 Other disorder of circulatory system: Secondary | ICD-10-CM

## 2019-05-09 ENCOUNTER — Telehealth: Payer: Self-pay | Admitting: *Deleted

## 2019-05-09 ENCOUNTER — Telehealth (HOSPITAL_COMMUNITY): Payer: Self-pay

## 2019-05-09 NOTE — Telephone Encounter (Signed)
Patient called c/o groin pain s/p EVAR 1 week ago. Denies fever, heart, redness or swelling at this area. He claims it is 8 out of 10 on pain scale and worse when getting up to stand. He is getting Covid vaccine today and will agree to appt with PA for wound check 05/10/2019.

## 2019-05-09 NOTE — Telephone Encounter (Signed)

## 2019-05-10 ENCOUNTER — Other Ambulatory Visit: Payer: Self-pay

## 2019-05-10 ENCOUNTER — Ambulatory Visit (INDEPENDENT_AMBULATORY_CARE_PROVIDER_SITE_OTHER): Payer: Self-pay | Admitting: Physician Assistant

## 2019-05-10 VITALS — BP 141/72 | HR 56 | Temp 98.0°F | Resp 16 | Ht 69.0 in | Wt 199.0 lb

## 2019-05-10 DIAGNOSIS — I998 Other disorder of circulatory system: Secondary | ICD-10-CM

## 2019-05-10 DIAGNOSIS — Z95828 Presence of other vascular implants and grafts: Secondary | ICD-10-CM

## 2019-05-10 NOTE — Progress Notes (Signed)
POST OPERATIVE OFFICE NOTE    CC:  F/u for surgery  HPI:  This is a 69 y.o. male who is s/p EVAR by Dr. Trula Slade on 05/03/2019.  He comes in today with complaints of increased pain in bilateral groins.  He states that he does not have a chair that is high enough to sit in and it hurts to sit and then stand.  He states that his surgery was a week ago and did fine for a couple of days.  He states that on Sunday, his wife went to the grocery store and he helped her carry in some groceries.  Later that day, he started having some pain.  He states that he started having more bruising as well around Tuesday.  He states that he has pain in both groins but the right is a little bit worse than the left.  He does not have pain in his legs or feet.    Allergies  Allergen Reactions  . Atorvastatin Other (See Comments)    Leg cramps  . Sulfa Antibiotics Rash    Sun Rash     Current Outpatient Medications  Medication Sig Dispense Refill  . albuterol (PROVENTIL HFA;VENTOLIN HFA) 108 (90 BASE) MCG/ACT inhaler Inhale 2 puffs into the lungs every 6 (six) hours as needed for wheezing or shortness of breath.    Marland Kitchen albuterol (PROVENTIL) (2.5 MG/3ML) 0.083% nebulizer solution Inhale 2.5 mg into the lungs every 6 (six) hours as needed for wheezing or shortness of breath.    Marland Kitchen aspirin EC 81 MG tablet Take 81 mg by mouth every evening.     Marland Kitchen azelastine (ASTELIN) 0.1 % nasal spray Place 1 spray into both nostrils daily as needed for allergies. Use in each nostril as directed     . Cholecalciferol (VITAMIN D3) 50 MCG (2000 UT) TABS Take 2,000 Units by mouth daily.    . DENTA 5000 PLUS 1.1 % CREA dental cream Take 1 application by mouth 2 (two) times daily. Brush teeth for at least 1 min  5  . fexofenadine (ALLEGRA) 180 MG tablet Take 180 mg by mouth daily.    . fluticasone (FLONASE) 50 MCG/ACT nasal spray Place 2 sprays into both nostrils daily.     . furosemide (LASIX) 40 MG tablet TAKE 1 TABLET BY MOUTH EVERY DAY  (Patient taking differently: Take 40 mg by mouth 4 (four) times a week. ) 90 tablet 1  . HYDROcodone-acetaminophen (NORCO/VICODIN) 5-325 MG tablet Take 1 tablet by mouth every 4 (four) hours as needed for moderate pain. 6 tablet 0  . losartan (COZAAR) 25 MG tablet Take 25 mg by mouth daily.    . Melatonin 3 MG CAPS Take 3 mg by mouth at bedtime as needed (sleep).    . metFORMIN (GLUCOPHAGE) 500 MG tablet Take 500 mg by mouth daily.    . metoprolol tartrate (LOPRESSOR) 25 MG tablet Take 1 tablet (25 mg total) by mouth 2 (two) times daily. 60 tablet 3  . montelukast (SINGULAIR) 10 MG tablet Take 10 mg by mouth every evening.    . potassium chloride 20 MEQ TBCR Take 20 mEq by mouth daily. (Patient taking differently: Take 20 mEq by mouth 4 (four) times a week. With lasix) 30 tablet 3  . Roflumilast (DALIRESP) 250 MCG TABS Take 250 mcg by mouth every evening.    . rosuvastatin (CRESTOR) 40 MG tablet Take 40 mg by mouth daily.    . sildenafil (REVATIO) 20 MG tablet Take 20-100 mg  by mouth daily as needed for erectile dysfunction.    . SYMBICORT 160-4.5 MCG/ACT inhaler Inhale 2 puffs into the lungs 2 (two) times a day.    . Tiotropium Bromide Monohydrate (SPIRIVA RESPIMAT) 2.5 MCG/ACT AERS Inhale 2 puffs into the lungs daily.     . valACYclovir (VALTREX) 500 MG tablet Take 500 mg by mouth every evening.     No current facility-administered medications for this visit.     ROS:  See HPI  Physical Exam:  Today's Vitals   05/10/19 0903  BP: (!) 141/72  Pulse: (!) 56  Resp: 16  Temp: 98 F (36.7 C)  SpO2: 98%  Weight: 199 lb (90.3 kg)  Height: 5\' 9"  (1.753 m)  PainSc: 8    Body mass index is 29.39 kg/m.   Incision:  Bilateral groins are soft and stick sites look good.  Ecchymosis over bilateral groins and pubis area.  Extremities:  Palpable left 2+ DP and right 1+ DP/PT; bilateral groins are tender to palpation.  Unable to appreciate pulsatile mass.   Assessment/Plan:  This is a 69  y.o. male who is s/p: EVAR on 05/03/2019 by Dr. Trula Slade who presents today with increased pain in bilateral groins.    -pt seen and evaluated with Dr. Donzetta Matters.  Pt very tender over both groins.  Unable to appreciate any pulsatile mass.  Pt most likely would not tolerate u/s.  Dr. Donzetta Matters advised pt to move CT scan to next week and see Dr. Trula Slade on 05/20/2019.  The pt's last creatinine was 1.13.   -the pt will call back sooner if his pain worsens.  Advised pt to take it easy and not do any heavy lifting or anything strenuous.     Leontine Locket, PA-C [facility] [phone]  Clinic MD:  Donzetta Matters

## 2019-05-14 ENCOUNTER — Telehealth: Payer: Self-pay

## 2019-05-14 ENCOUNTER — Ambulatory Visit (INDEPENDENT_AMBULATORY_CARE_PROVIDER_SITE_OTHER): Payer: Self-pay | Admitting: Physician Assistant

## 2019-05-14 ENCOUNTER — Other Ambulatory Visit: Payer: Self-pay

## 2019-05-14 VITALS — BP 159/80 | HR 67 | Temp 98.3°F | Resp 18 | Ht 69.0 in | Wt 203.2 lb

## 2019-05-14 DIAGNOSIS — Z95828 Presence of other vascular implants and grafts: Secondary | ICD-10-CM

## 2019-05-14 MED ORDER — CEPHALEXIN 500 MG PO CAPS: 500.0000 mg | ORAL_CAPSULE | Freq: Two times a day (BID) | ORAL | 0 refills | Status: DC

## 2019-05-14 NOTE — Telephone Encounter (Signed)
Pt called and said that he has a lot of redness around his incision area and it is puffy as well. He said that he has also noticed drainage that isnt clear but doesn't really think that it is pus. He said that he would really like to be seen in order to have some peace of mind.   Appt made.   York Cerise, CMA

## 2019-05-14 NOTE — Progress Notes (Signed)
POST OPERATIVE OFFICE NOTE    CC:  F/u for surgery  HPI:  This is a 69 y.o. male who is s/p EVAR by Dr. Trula Slade on 05/03/2019.  He was seen on 05/10/2019 and at that time, he came in with complaints of increased pain in bilateral groins.  He states that he does not have a chair that is high enough to sit in and it hurts to sit and then stand.  He states that his surgery was a week ago and did fine for a couple of days.  He states that on Sunday, his wife went to the grocery store and he helped her carry in some groceries.  Later that day, he started having some pain.  He states that he started having more bruising as well around Tuesday.  He states that he has pain in both groins but the right is a little bit worse than the left.  He does not have pain in his legs or feet.  He was also examined by Dr. Donzetta Matters.  He was very tender to palpation in both groins and it was felt that he would not tolerate a duplex and therefore, he was set up for a CT scan this week and see Dr. Trula Slade next Monday.    He came back in today with c/o some mild drainage from the left groin that he describes as a clearish brown color.  He states there is a little redness over the left groin as well.   He states that after I saw him on Friday, he took it easy over the weekend and his soreness is much improved.  He denies having any fever or chills.    Allergies  Allergen Reactions  . Atorvastatin Other (See Comments)    Leg cramps  . Sulfa Antibiotics Rash    Sun Rash     Current Outpatient Medications  Medication Sig Dispense Refill  . albuterol (PROVENTIL HFA;VENTOLIN HFA) 108 (90 BASE) MCG/ACT inhaler Inhale 2 puffs into the lungs every 6 (six) hours as needed for wheezing or shortness of breath.    Marland Kitchen albuterol (PROVENTIL) (2.5 MG/3ML) 0.083% nebulizer solution Inhale 2.5 mg into the lungs every 6 (six) hours as needed for wheezing or shortness of breath.    Marland Kitchen aspirin EC 81 MG tablet Take 81 mg by mouth every evening.      Marland Kitchen azelastine (ASTELIN) 0.1 % nasal spray Place 1 spray into both nostrils daily as needed for allergies. Use in each nostril as directed     . Cholecalciferol (VITAMIN D3) 50 MCG (2000 UT) TABS Take 2,000 Units by mouth daily.    . DENTA 5000 PLUS 1.1 % CREA dental cream Take 1 application by mouth 2 (two) times daily. Brush teeth for at least 1 min  5  . fexofenadine (ALLEGRA) 180 MG tablet Take 180 mg by mouth daily.    . fluticasone (FLONASE) 50 MCG/ACT nasal spray Place 2 sprays into both nostrils daily.     . furosemide (LASIX) 40 MG tablet TAKE 1 TABLET BY MOUTH EVERY DAY (Patient taking differently: Take 40 mg by mouth 4 (four) times a week. ) 90 tablet 1  . HYDROcodone-acetaminophen (NORCO/VICODIN) 5-325 MG tablet Take 1 tablet by mouth every 4 (four) hours as needed for moderate pain. 6 tablet 0  . losartan (COZAAR) 25 MG tablet Take 25 mg by mouth daily.    . Melatonin 3 MG CAPS Take 3 mg by mouth at bedtime as needed (sleep).    Marland Kitchen  metFORMIN (GLUCOPHAGE) 500 MG tablet Take 500 mg by mouth daily.    . metoprolol tartrate (LOPRESSOR) 25 MG tablet Take 1 tablet (25 mg total) by mouth 2 (two) times daily. 60 tablet 3  . montelukast (SINGULAIR) 10 MG tablet Take 10 mg by mouth every evening.    . potassium chloride 20 MEQ TBCR Take 20 mEq by mouth daily. (Patient taking differently: Take 20 mEq by mouth 4 (four) times a week. With lasix) 30 tablet 3  . Roflumilast (DALIRESP) 250 MCG TABS Take 250 mcg by mouth every evening.    . rosuvastatin (CRESTOR) 40 MG tablet Take 40 mg by mouth daily.    . sildenafil (REVATIO) 20 MG tablet Take 20-100 mg by mouth daily as needed for erectile dysfunction.    . SYMBICORT 160-4.5 MCG/ACT inhaler Inhale 2 puffs into the lungs 2 (two) times a day.    . Tiotropium Bromide Monohydrate (SPIRIVA RESPIMAT) 2.5 MCG/ACT AERS Inhale 2 puffs into the lungs daily.     . valACYclovir (VALTREX) 500 MG tablet Take 500 mg by mouth every evening.     No current  facility-administered medications for this visit.     ROS:  See HPI  Physical Exam:  Today's Vitals   05/14/19 1414  BP: (!) 159/80  Pulse: 67  Resp: 18  Temp: 98.3 F (36.8 C)  TempSrc: Temporal  SpO2: 96%  Weight: 203 lb 3.2 oz (92.2 kg)  Height: 5\' 9"  (1.753 m)  PainSc: 2    Body mass index is 30.01 kg/m.   Incision:  Right groin is clean and in tact; left groin is clean with a mild serous drainage.  There is some mild erythema medial to the incision but not over the incision.  Extremities:  2+ palpable bilateral femoral and DP pulses.  His ecchymosis has improved.  Tenderness over bilateral groins has significantly improved as well.     Assessment/Plan:  This is a 69 y.o. male who is s/p: EVAR on 05/03/2019 by Dr. Trula Slade who presented last week with increased pain in both groins and comes back in today for drainage and erythema over the left groin.   -given the pain in his groins, he was set up for CT scan this week and f/u with Dr. Trula Slade next Monday.   He will keep the appt for the CT scan and his appt with Dr. Trula Slade on Monday. -he does have some mild serous drainage from the left groin when expressed.  He has not had any fevers.  He does have mild erythema medial to the incision.  Given the drainage and erythema and he has the stent graft in place, will give him Keflex 500mg  bid x 7 days.   -he will call sooner if he has any issues prior to his next visit.    Leontine Locket, PA-C [facility] [phone]  Clinic MD:  Early

## 2019-05-17 ENCOUNTER — Ambulatory Visit
Admission: RE | Admit: 2019-05-17 | Discharge: 2019-05-17 | Disposition: A | Discharge status: Home / Self Care | Payer: Medicare Other | Source: Ambulatory Visit | Attending: Surgery | Admitting: Surgery

## 2019-05-17 ENCOUNTER — Telehealth (HOSPITAL_COMMUNITY): Payer: Self-pay

## 2019-05-17 DIAGNOSIS — I998 Other disorder of circulatory system: Secondary | ICD-10-CM

## 2019-05-17 MED ORDER — IOPAMIDOL (ISOVUE-370) INJECTION 76%
75.0000 mL | Freq: Once | INTRAVENOUS | Status: AC | PRN
  Administered 2019-05-17: 75 mL via INTRAVENOUS

## 2019-05-17 NOTE — Telephone Encounter (Signed)

## 2019-05-20 ENCOUNTER — Ambulatory Visit (INDEPENDENT_AMBULATORY_CARE_PROVIDER_SITE_OTHER): Payer: Self-pay | Admitting: Surgery

## 2019-05-20 ENCOUNTER — Encounter: Payer: Self-pay | Admitting: Surgery

## 2019-05-20 ENCOUNTER — Other Ambulatory Visit: Payer: Self-pay

## 2019-05-20 VITALS — BP 138/79 | HR 57 | Temp 97.5°F | Resp 20 | Ht 69.0 in | Wt 202.0 lb

## 2019-05-20 DIAGNOSIS — I714 Abdominal aortic aneurysm, without rupture, unspecified: Secondary | ICD-10-CM

## 2019-05-20 NOTE — Progress Notes (Signed)
Patient name: Melvin Brewer MRN: [id number] DOB: June 12, 1950 Sex: male  REASON FOR VISIT:     post op  HISTORY OF PRESENT ILLNESS:   Melvin Brewer is a 69 y.o. male who I have been following for abdominal aortic aneurysm.  At his last visit it measured 5.1 cm and so we elected to proceed with repair.  On 05/03/2019 he underwent endovascular repair without incident.  He was seen in the office several times for bilateral groin pain after his operation.  His groin pain has now resolved.  He does have some drainage from the left side incision   Patient suffers from coronary artery disease. 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 He suffers from COPD. He is a diabetic. He is a former smoker. He is medically managed for hypertension and takes a statin for hypercholesterolemia.   CURRENT MEDICATIONS:    Current Outpatient Medications  Medication Sig Dispense Refill  . albuterol (PROVENTIL HFA;VENTOLIN HFA) 108 (90 BASE) MCG/ACT inhaler Inhale 2 puffs into the lungs every 6 (six) hours as needed for wheezing or shortness of breath.    Marland Kitchen albuterol (PROVENTIL) (2.5 MG/3ML) 0.083% nebulizer solution Inhale 2.5 mg into the lungs every 6 (six) hours as needed for wheezing or shortness of breath.    Marland Kitchen aspirin EC 81 MG tablet Take 81 mg by mouth every evening.     Marland Kitchen azelastine (ASTELIN) 0.1 % nasal spray Place 1 spray into both nostrils daily as needed for allergies. Use in each nostril as directed     . cephALEXin (KEFLEX) 500 MG capsule Take 1 capsule (500 mg total) by mouth 2 (two) times daily. 14 capsule 0  . Cholecalciferol (VITAMIN D3) 50 MCG (2000 UT) TABS Take 2,000 Units by mouth daily.    . DENTA 5000 PLUS 1.1 % CREA dental cream Take 1 application by mouth 2 (two) times daily. Brush teeth for at least 1 min  5  . fexofenadine (ALLEGRA) 180 MG tablet Take 180 mg by  mouth daily.    . fluticasone (FLONASE) 50 MCG/ACT nasal spray Place 2 sprays into both nostrils daily.     . furosemide (LASIX) 40 MG tablet TAKE 1 TABLET BY MOUTH EVERY DAY (Patient taking differently: Take 40 mg by mouth 4 (four) times a week. ) 90 tablet 1  . HYDROcodone-acetaminophen (NORCO/VICODIN) 5-325 MG tablet Take 1 tablet by mouth every 4 (four) hours as needed for moderate pain. (Patient not taking: Reported on 05/14/2019) 6 tablet 0  . losartan (COZAAR) 25 MG tablet Take 25 mg by mouth daily.    . Melatonin 3 MG CAPS Take 3 mg by mouth at bedtime as needed (sleep).    . metFORMIN (GLUCOPHAGE) 500 MG tablet Take 500 mg by mouth daily.    . metoprolol tartrate (LOPRESSOR) 25 MG tablet Take 1 tablet (25 mg total) by mouth 2 (two) times daily. 60 tablet 3  . montelukast (SINGULAIR) 10 MG tablet Take 10 mg by mouth every evening.    . potassium chloride 20 MEQ TBCR Take 20 mEq by mouth daily. (Patient taking differently: Take 20 mEq by mouth 4 (four) times a week. With lasix) 30 tablet 3  . Roflumilast (DALIRESP) 250 MCG TABS Take 250 mcg by mouth every evening.    . rosuvastatin (CRESTOR) 40 MG tablet Take 40 mg by mouth daily.    . sildenafil (REVATIO) 20 MG tablet  Take 20-100 mg by mouth daily as needed for erectile dysfunction.    . SYMBICORT 160-4.5 MCG/ACT inhaler Inhale 2 puffs into the lungs 2 (two) times a day.    . Tiotropium Bromide Monohydrate (SPIRIVA RESPIMAT) 2.5 MCG/ACT AERS Inhale 2 puffs into the lungs daily.     . valACYclovir (VALTREX) 500 MG tablet Take 500 mg by mouth every evening.     No current facility-administered medications for this visit.    REVIEW OF SYSTEMS:   [X]  denotes positive finding, [ ]  denotes negative finding Cardiac  Comments:  Chest pain or chest pressure:    Shortness of breath upon exertion:    Short of breath when lying flat:    Irregular heart rhythm:    Constitutional    Fever or chills:      PHYSICAL EXAM:   There were no  vitals filed for this visit.  GENERAL: The patient is a well-nourished male, in no acute distress. The vital signs are documented above. CARDIOVASCULAR: There is a regular rate and rhythm. PULMONARY: Non-labored respirations I probed the left groin incision.  I then used silver nitrate to cauterize it.  STUDIES:   CTA: VASCULAR  1. Post uncomplicated endovascular repair of infrarenal abdominal aortic aneurysm without evidence of endoleak. 2. The now excluded native abdominal aortic aneurysm sac measures 5.1 cm in maximal diameter, unchanged compared to the preoperative examination performed 04/24/2019. 3. Additional ancillary vascular findings as detailed above, grossly unchanged compared to the 04/24/2019 preoperative examination.  NON-VASCULAR  1. Minimal amount of postoperative ill-defined stranding within the bilateral groins without peripherally enhancing definable/drainable fluid collection. 2. Additional non-acute ancillary findings are unchanged compared to the 04/23/2018 preoperative examination.   MEDICAL ISSUES:   Status post endovascular pair for a 5.1 cm infrarenal abdominal aortic aneurysm.  Postoperative CT scan shows the stent graft to be in good position with no evidence of endoleak.  He will follow-up in 6 months with abdominal ultrasound  Left groin incision: This was probed and then cauterized with silver nitrate.  I suspect this will heal up within the next week.  He will contact me if it does not.  Leia Alf, MD, FACS [facility] [phone] Pager [phone]

## 2019-05-21 ENCOUNTER — Other Ambulatory Visit: Payer: Self-pay | Admitting: *Deleted

## 2019-05-21 DIAGNOSIS — Z95828 Presence of other vascular implants and grafts: Secondary | ICD-10-CM

## 2019-05-21 DIAGNOSIS — E1129 Type 2 diabetes mellitus with other diabetic kidney complication: Secondary | ICD-10-CM | POA: Insufficient documentation

## 2019-05-21 DIAGNOSIS — I10 Essential (primary) hypertension: Secondary | ICD-10-CM | POA: Insufficient documentation

## 2019-05-21 DIAGNOSIS — R809 Proteinuria, unspecified: Secondary | ICD-10-CM | POA: Insufficient documentation

## 2019-05-21 DIAGNOSIS — E875 Hyperkalemia: Secondary | ICD-10-CM | POA: Insufficient documentation

## 2019-05-23 ENCOUNTER — Encounter: Payer: Self-pay | Admitting: Surgery

## 2019-06-03 ENCOUNTER — Encounter: Payer: Federal, State, Local not specified - PPO | Admitting: Surgery

## 2019-07-19 NOTE — Progress Notes (Signed)
Patient ID: Melvin Brewer, male    DOB: 1951-03-19, 69 y.o.   MRN: [id number]  HPI  Melvin Brewer is a 69 y/o male with a history of CAD, DM, hyperlipidemia, HTN, COPD, GERD, former tobacco use and chronic heart failure.   Echo report from 08/29/2018 reviewed and showed an EF of 50% along with mild Melvin/TR & mild LVH.   Catheterization done 09/05/2018 showed:  Ost 1st Mrg lesion is 100% stenosed.  1st LPL lesion is 100% stenosed.  Dist LM to Ost LAD lesion is 100% stenosed.  Origin to Prox Graft lesion is 100% stenosed.  Dist Graft lesion is 50% stenosed.   Conclusion Right heart cath Mean wedge of 20 PA 45/21 mean of 30 Cardiac output of 7.2 Fick  Multivessel coronary disease including coronary bypass Preserved left ventricular function borderline ejection fraction around 50% Native vessels with flush occlusion of the LAD CTO but grafted by LIMA Circumflex large left dominant minor irregularities occlusion of OM1 and OM 2 but grafted RCA nondominant Grafts LIMA to LAD atretic and occluded SVG jump graft from OM1 to OM 2  ,50% mid lesion but patent Significant collaterals right to left Significant collaterals left to left Almost complete revascularization of the LAD through collaterals  Admitted 05/02/19 due to endovascular repair of abdominal aortic aneurysm. Discharged the following day.    He presents today for his follow-up visit with a chief complaint of minimal shortness of breat upon moderate exertion. He describes this as chronic in nature having been present for several years. He has associated fatigue, occasional cough, difficulty sleeping and fluctuating weight along with this. He denies any dizziness, abdominal distention, palpitations, pedal edema, chest pain or wheezing.   Was unable to finish pulmonary rehab because he had great difficulty exercising with having to wear his mask. He does walk ~ 3 miles daily at home. Has been fully COVID vaccinated.    Past  Medical History:  Diagnosis Date  . AAA (abdominal aortic aneurysm) ([place])   . Abdominal aortic aneurysm (AAA) ([place])   . Allergic rhinitis   . Anginal pain ([place])   . Centrilobular emphysema ([place])   . Cervical spine degeneration   . CHF (congestive heart failure) ([employer])   . COPD (chronic obstructive pulmonary disease) ([place])   . Coronary artery disease   . Diabetes mellitus without complication ([place])   . Diverticulitis   . DJD (degenerative joint disease)   . Early cataract   . Eczema   . GERD (gastroesophageal reflux disease)   . H/O hiatal hernia    AGE 21 S  NO MEDS  . Hemorrhoids   . Herpes simplex infection   . Hypercholesterolemia   . Hyperglycemia   . Hyperlipidemia   . Hypertension   . Macular degeneration    right eye  . Obesity   . Shortness of breath    W/ EXERTION   . Wears glasses   . Wears partial dentures    Past Surgical History:  Procedure Laterality Date  . ABDOMINAL AORTIC ENDOVASCULAR STENT GRAFT N/A 05/03/2019   Procedure: ABDOMINAL AORTIC ENDOVASCULAR STENT GRAFT;  Surgeon: Serafina Mitchell, MD;  Location: [facility];  Service: Vascular;  Laterality: N/A;  . ANTERIOR CERVICAL DECOMP/DISCECTOMY FUSION N/A 06/20/2013   Procedure: CERVCIAL FIVE-SIX,CERVICAL SIX-SEVEN ANTERIOR CERVICAL DECOMPRESSION WITH FUSION INTERBODY PROSTHESIS PLATING AND PEEK CAGE;  Surgeon: Ophelia Charter, MD;  Location: [facility];  Service: Neurosurgery;  Laterality: N/A;  anterior  . CARDIAC CATHETERIZATION  [place]  2007   . CHEST TUBE INSERTION Left 10/15/2018   Procedure: INSERTION OF LEFT CHEST TUBE;  Surgeon: Ivin Poot, MD;  Location: [facility];  Service: Thoracic;  Laterality: Left;  . COLONOSCOPY WITH PROPOFOL N/A 12/26/2017   Procedure: COLONOSCOPY WITH PROPOFOL;  Surgeon: Lollie Sails, MD;  Location: [facility];  Service: Endoscopy;  Laterality: N/A;  . CORONARY ARTERY BYPASS GRAFT     2007  _0   . CORONARY ARTERY BYPASS GRAFT N/A 10/04/2018   Procedure:  REDO CORONARY ARTERY BYPASS GRAFTING (CABG) x 1, SVG TO LAD, USING LEFT GREATER SAPHENOUS VEIN HARVESTED ENDOSCOPICALLY;  Surgeon: Ivin Poot, MD;  Location: [facility];  Service: Open Heart Surgery;  Laterality: N/A;  . EYE SURGERY     RT EYE LASER (RET DETACHMENT)  . RIGHT/LEFT HEART CATH AND CORONARY/GRAFT ANGIOGRAPHY Left 09/05/2018   Procedure: RIGHT/LEFT HEART CATH AND CORONARY/GRAFT ANGIOGRAPHY;  Surgeon: Yolonda Kida, MD;  Location: [facility];  Service: Cardiovascular;  Laterality: Left;  . TEE WITHOUT CARDIOVERSION N/A 10/04/2018   Procedure: TRANSESOPHAGEAL ECHOCARDIOGRAM (TEE);  Surgeon: Prescott Gum, Collier Salina, MD;  Location: [facility];  Service: Open Heart Surgery;  Laterality: N/A;  . TONSILLECTOMY    . ULTRASOUND GUIDANCE FOR VASCULAR ACCESS Bilateral 05/03/2019   Procedure: Ultrasound Guidance For Vascular Access, bilateral femoral arteries;  Surgeon: Serafina Mitchell, MD;  Location: [facility];  Service: Vascular;  Laterality: Bilateral;  . VIDEO BRONCHOSCOPY WITH INSERTION OF INTERBRONCHIAL VALVE (IBV) N/A 10/26/2018   Procedure: VIDEO BRONCHOSCOPY WITH INSERTION OF INTERBRONCHIAL VALVES (IBV);  Surgeon: Prescott Gum, Collier Salina, MD;  Location: [facility];  Service: Thoracic;  Laterality: N/A;  . VIDEO BRONCHOSCOPY WITH INSERTION OF INTERBRONCHIAL VALVE (IBV) N/A 12/31/2018   Procedure: VIDEO BRONCHOSCOPY WITH REMOVAL OF INTERBRONCHIAL VALVE (IBV);  Surgeon: Prescott Gum, Collier Salina, MD;  Location: [facility];  Service: Thoracic;  Laterality: N/A;   No family history on file. Social History   Tobacco Use  . Smoking status: Former Smoker    Packs/day: 2.00    Years: 43.00    Pack years: 86.00    Types: Cigarettes    Quit date: 04/11/2004    Years since quitting: 15.2  . Smokeless tobacco: Never Used  Substance Use Topics  . Alcohol use: Yes    Alcohol/week: 7.0 standard drinks    Types: 7 Standard drinks or equivalent per week   Allergies  Allergen Reactions  . Atorvastatin Other (See Comments)     Leg cramps  . Sulfa Antibiotics Rash    Sun Rash    Prior to Admission medications   Medication Sig Start Date End Date Taking? Authorizing Provider  albuterol (PROVENTIL HFA;VENTOLIN HFA) 108 (90 BASE) MCG/ACT inhaler Inhale 2 puffs into the lungs every 6 (six) hours as needed for wheezing or shortness of breath.   Yes [provider]  albuterol (PROVENTIL) (2.5 MG/3ML) 0.083% nebulizer solution Inhale 2.5 mg into the lungs every 6 (six) hours as needed for wheezing or shortness of breath. 04/27/18  Yes [provider]  aspirin EC 81 MG tablet Take 81 mg by mouth every evening.    Yes [provider]  azelastine (ASTELIN) 0.1 % nasal spray Place 1 spray into both nostrils daily as needed for allergies. Use in each nostril as directed    Yes [provider]  Cholecalciferol (VITAMIN D3) 50 MCG (2000 UT) TABS Take 2,000 Units by mouth daily.   Yes [provider]  DENTA 5000 PLUS 1.1 %  CREA dental cream Take 1 application by mouth 2 (two) times daily. Brush teeth for at least 1 min 05/09/17  Yes [provider]  fexofenadine (ALLEGRA) 180 MG tablet Take 180 mg by mouth daily.   Yes [provider]  fluticasone (FLONASE) 50 MCG/ACT nasal spray Place 2 sprays into both nostrils daily.    Yes [provider]  furosemide (LASIX) 20 MG tablet Take 20 mg by mouth as needed.   Yes [provider]  losartan (COZAAR) 25 MG tablet Take 25 mg by mouth daily. 07/08/18  Yes [provider]  Melatonin 3 MG CAPS Take 3 mg by mouth at bedtime as needed (sleep).   Yes [provider]  metFORMIN (GLUCOPHAGE) 500 MG tablet Take 500 mg by mouth daily.   Yes [provider]  metoprolol tartrate (LOPRESSOR) 25 MG tablet Take 1 tablet (25 mg total) by mouth 2 (two) times daily. 11/01/18  Yes Barrett, Erin R, PA-C  montelukast (SINGULAIR) 10 MG tablet Take 10 mg by mouth every evening. 07/02/18  Yes [provider]  potassium chloride 20 MEQ TBCR Take 20 mEq by mouth daily. Patient taking differently: Take 20 mEq by mouth as needed. With lasix 11/01/18  Yes Barrett, Erin R, PA-C  Roflumilast (DALIRESP) 250 MCG TABS Take 250 mcg by mouth every evening.   Yes [provider]  rosuvastatin (CRESTOR) 40 MG tablet Take 40 mg by mouth daily.   Yes [provider]  sildenafil (REVATIO) 20 MG tablet Take 20-100 mg by mouth daily as needed for erectile dysfunction. 12/07/17  Yes [provider]  SYMBICORT 160-4.5 MCG/ACT inhaler Inhale 2 puffs into the lungs 2 (two) times a day. 05/07/18  Yes [provider]  Tiotropium Bromide Monohydrate (SPIRIVA RESPIMAT) 2.5 MCG/ACT AERS Inhale 2 puffs into the lungs daily.  03/06/17  Yes [provider]  valACYclovir (VALTREX) 500 MG tablet Take 500 mg by mouth every evening. 06/13/18  Yes [provider]     Review of Systems  Constitutional: Positive for fatigue. Negative for appetite change.  HENT: Negative for congestion, rhinorrhea and sore throat.   Eyes: Negative.   Respiratory: Positive for cough (occasional) and shortness of breath (with moderate exertion). Negative for chest tightness and wheezing.   Cardiovascular: Negative for chest pain, palpitations and leg swelling.  Gastrointestinal: Negative for abdominal distention and abdominal pain.  Endocrine: Negative.   Genitourinary: Negative.   Musculoskeletal: Negative for arthralgias and back pain.  Skin: Negative.   Allergic/Immunologic: Negative.   Neurological: Negative for dizziness and light-headedness.  Hematological: Negative for adenopathy. Bruises/bleeds easily.  Psychiatric/Behavioral: Positive for sleep disturbance (sleeping on 1 pillow). Negative for dysphoric mood. The patient is not nervous/anxious.    Vitals:   07/22/19 0826  BP: (!) 153/79  Pulse: (!) 54  Resp: 16  SpO2: 97%  Weight: 207 lb 6 oz (94.1 kg)  Height: '5\' 9"'$   (1.753 m)   Wt Readings from Last 3 Encounters:  07/22/19 207 lb 6 oz (94.1 kg)  05/20/19 202 lb (91.6 kg)  05/14/19 203 lb 3.2 oz (92.2 kg)   Lab Results  Component Value Date   CREATININE 1.13 05/04/2019   CREATININE 1.11 05/03/2019   CREATININE 1.09 05/01/2019     Physical Exam Vitals and nursing note reviewed.  Constitutional:      Appearance: He is well-developed.  HENT:     Head: Normocephalic and atraumatic.  Neck:     Vascular: No JVD.  Cardiovascular:  Rate and Rhythm: Normal rate and regular rhythm.  Pulmonary:     Effort: Pulmonary effort is normal. No accessory muscle usage.     Breath sounds: No wheezing or rales.  Abdominal:     Palpations: Abdomen is soft.     Tenderness: There is no abdominal tenderness.  Musculoskeletal:     Cervical back: Normal range of motion.     Right lower leg: No tenderness. No edema.     Left lower leg: No tenderness. No edema.  Skin:    General: Skin is warm and dry.  Neurological:     General: No focal deficit present.     Mental Status: He is alert and oriented to person, place, and time.  Psychiatric:        Mood and Affect: Mood normal.        Behavior: Behavior normal.    Assessment & Plan:  1: Chronic heart failure with preserved ejection fraction- - NYHA class II - euvolemic today - weighing daily and he was instructed to call for an overnight weight gain of >2 pounds or a weekly weight gain of >5 pounds - weight stable from last visit 6 months ago - not adding salt and has been reading food labels; reviewed the importance of closely following a 209m sodium diet - says that he doesn't take his furosemide on days that he has a lot to do and estimates that he takes it approximately every few days - saw cardiology (Clayborn Bigness 02/14/2019 & returns May 2021 - does drink 1 scotch a few times / week - BNP 08/23/2018 was 196.0  2: HTN- - BP mildly elevated today but he was able to walk to our office from the  [place] - saw PCP ([facility]) 06/10/19 - BMP 05/04/19 reviewed and showed sodium 139, potassium 4.1, creatinine 1.13 and GFR >60  3: DM- - A1c 05/01/19 was 6.1% - glucose this morning at home was 98 - saw nephrology ([facility]) 05/21/19  4: COPD- - quit smoking ~ 2007 - saw pulmonology (Raul Del 06/19/19   Patient did not bring his medications nor a list. Each medication was verbally reviewed with the patient and he was encouraged to bring the bottles to every visit to confirm accuracy of list.  Due to HF stability, will not make a return appointment for patient at this time. Advised patient to call [facility] in the future at any time if he'd like to make another appointment. Patient was comfortable with this plan.

## 2019-07-22 ENCOUNTER — Encounter: Payer: Self-pay | Admitting: Family

## 2019-07-22 ENCOUNTER — Ambulatory Visit: Payer: Medicare Other | Attending: Family | Admitting: Family

## 2019-07-22 ENCOUNTER — Other Ambulatory Visit: Payer: Self-pay

## 2019-07-22 VITALS — BP 153/79 | HR 54 | Resp 16 | Ht 69.0 in | Wt 207.4 lb

## 2019-07-22 DIAGNOSIS — Z981 Arthrodesis status: Secondary | ICD-10-CM | POA: Diagnosis not present

## 2019-07-22 DIAGNOSIS — H3530 Unspecified macular degeneration: Secondary | ICD-10-CM | POA: Insufficient documentation

## 2019-07-22 DIAGNOSIS — Z888 Allergy status to other drugs, medicaments and biological substances status: Secondary | ICD-10-CM | POA: Insufficient documentation

## 2019-07-22 DIAGNOSIS — I509 Heart failure, unspecified: Secondary | ICD-10-CM | POA: Diagnosis present

## 2019-07-22 DIAGNOSIS — I2510 Atherosclerotic heart disease of native coronary artery without angina pectoris: Secondary | ICD-10-CM | POA: Diagnosis not present

## 2019-07-22 DIAGNOSIS — Z7951 Long term (current) use of inhaled steroids: Secondary | ICD-10-CM | POA: Insufficient documentation

## 2019-07-22 DIAGNOSIS — K219 Gastro-esophageal reflux disease without esophagitis: Secondary | ICD-10-CM | POA: Insufficient documentation

## 2019-07-22 DIAGNOSIS — Z951 Presence of aortocoronary bypass graft: Secondary | ICD-10-CM | POA: Diagnosis not present

## 2019-07-22 DIAGNOSIS — J432 Centrilobular emphysema: Secondary | ICD-10-CM | POA: Insufficient documentation

## 2019-07-22 DIAGNOSIS — I5032 Chronic diastolic (congestive) heart failure: Secondary | ICD-10-CM | POA: Diagnosis not present

## 2019-07-22 DIAGNOSIS — Z7984 Long term (current) use of oral hypoglycemic drugs: Secondary | ICD-10-CM | POA: Insufficient documentation

## 2019-07-22 DIAGNOSIS — E119 Type 2 diabetes mellitus without complications: Secondary | ICD-10-CM | POA: Insufficient documentation

## 2019-07-22 DIAGNOSIS — I110 Hypertensive heart disease with heart failure: Secondary | ICD-10-CM | POA: Diagnosis not present

## 2019-07-22 DIAGNOSIS — Z882 Allergy status to sulfonamides status: Secondary | ICD-10-CM | POA: Diagnosis not present

## 2019-07-22 DIAGNOSIS — Z87891 Personal history of nicotine dependence: Secondary | ICD-10-CM | POA: Insufficient documentation

## 2019-07-22 DIAGNOSIS — E669 Obesity, unspecified: Secondary | ICD-10-CM | POA: Diagnosis not present

## 2019-07-22 DIAGNOSIS — Z79899 Other long term (current) drug therapy: Secondary | ICD-10-CM | POA: Diagnosis not present

## 2019-07-22 DIAGNOSIS — J449 Chronic obstructive pulmonary disease, unspecified: Secondary | ICD-10-CM

## 2019-07-22 DIAGNOSIS — Z7982 Long term (current) use of aspirin: Secondary | ICD-10-CM | POA: Insufficient documentation

## 2019-07-22 DIAGNOSIS — E785 Hyperlipidemia, unspecified: Secondary | ICD-10-CM | POA: Insufficient documentation

## 2019-07-22 DIAGNOSIS — Z6830 Body mass index (BMI) 30.0-30.9, adult: Secondary | ICD-10-CM | POA: Insufficient documentation

## 2019-07-22 DIAGNOSIS — M1990 Unspecified osteoarthritis, unspecified site: Secondary | ICD-10-CM | POA: Insufficient documentation

## 2019-07-22 DIAGNOSIS — I10 Essential (primary) hypertension: Secondary | ICD-10-CM

## 2019-07-22 NOTE — Patient Instructions (Addendum)
Continue weighing daily and call for an overnight weight gain of > 2 pounds or a weekly weight gain of >5 pounds.   Call us in the future if you'd like to schedule another appointment 

## 2019-09-13 IMAGING — DX PORTABLE CHEST - 1 VIEW
1 series · 1 of 1 positions shown · non-contrast
Comparison: One-view chest x-ray 10/14/2018

CLINICAL DATA: CABG 10/04/2018

EXAM:
PORTABLE CHEST 1 VIEW

[chest ap]
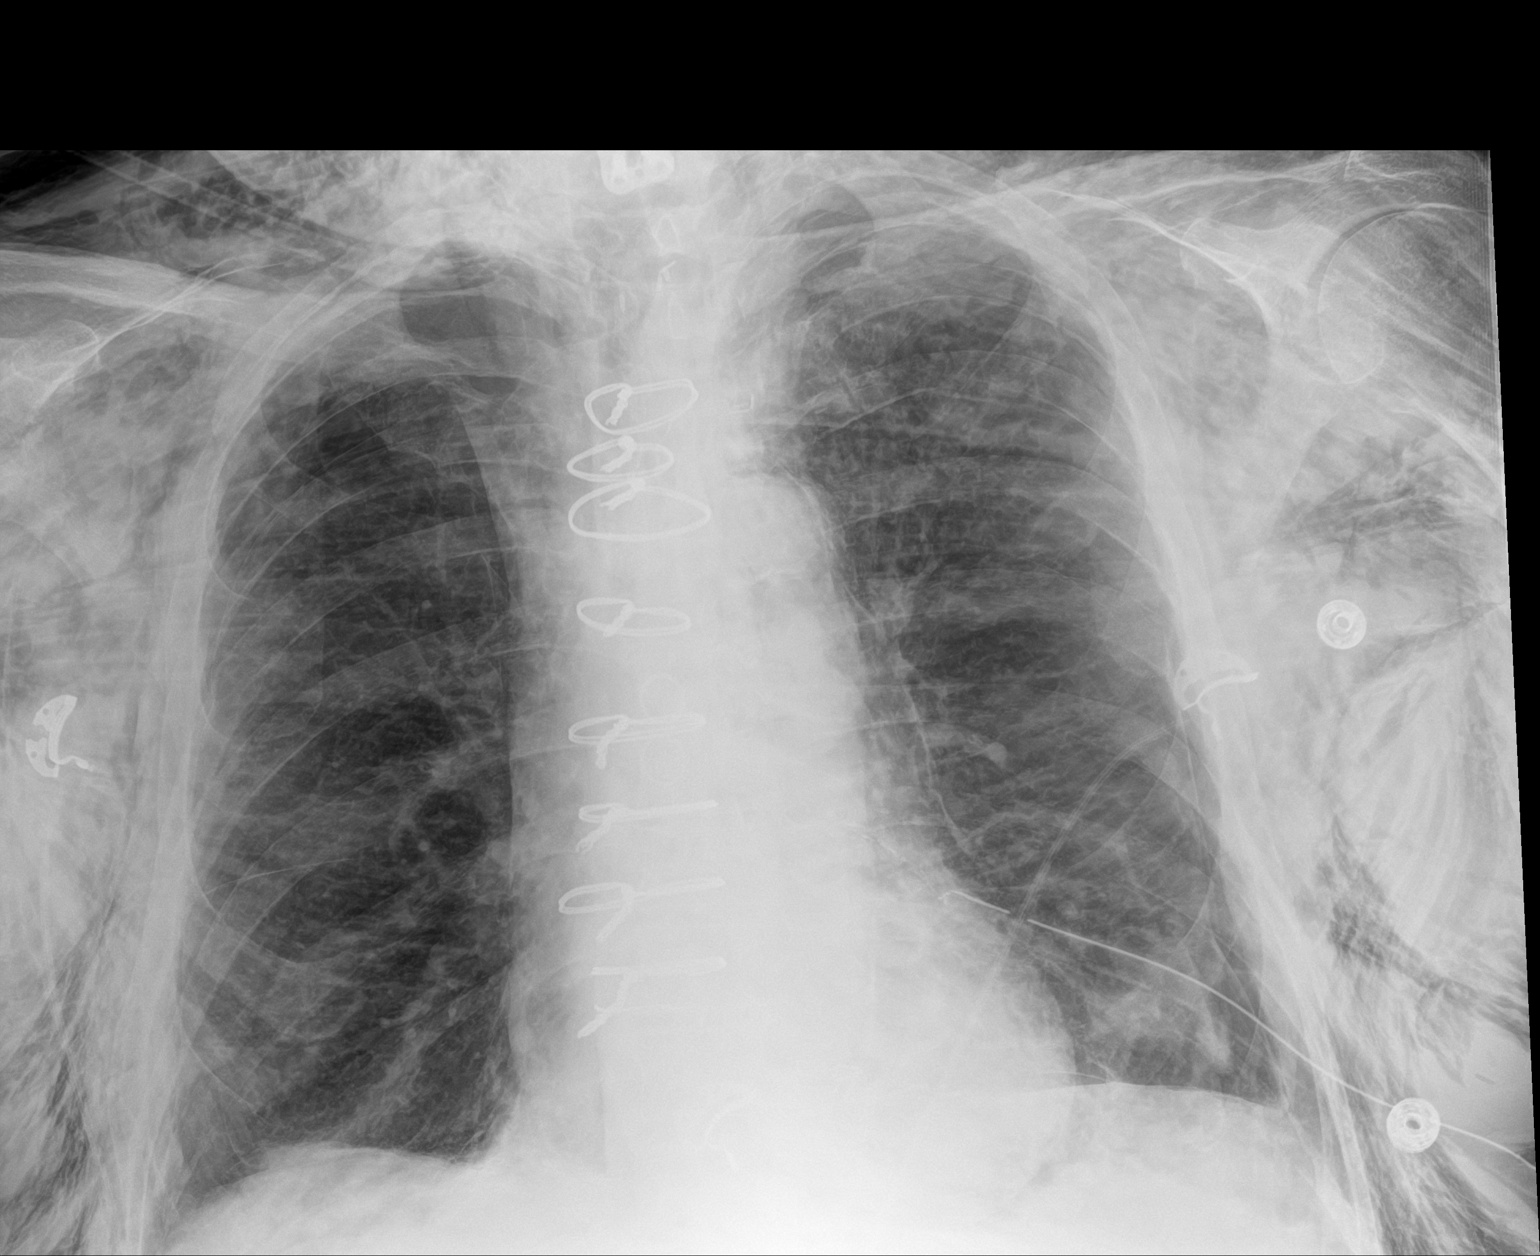

[1 of 1 positions shown; findings below may reference images not displayed]

FINDINGS: Left pneumothorax is increasing in size, now seen over the lower
lobes. Left-sided chest tube is stable.

Heart size is normal. Atherosclerotic calcifications are again noted
at the aorta.

Extensive subcutaneous emphysema is again seen.
IMPRESSION: 1. Left pneumothorax visualized over the lower lobes.
2. Left-sided chest tube stable in position.
3. Stable diffuse subcutaneous emphysema.

## 2019-09-14 IMAGING — DX PORTABLE CHEST - 1 VIEW
1 series · 1 of 1 positions shown · non-contrast
Comparison: 10/15/2018.

CLINICAL DATA: Chest tube.

EXAM:
PORTABLE CHEST 1 VIEW

[chest ap]
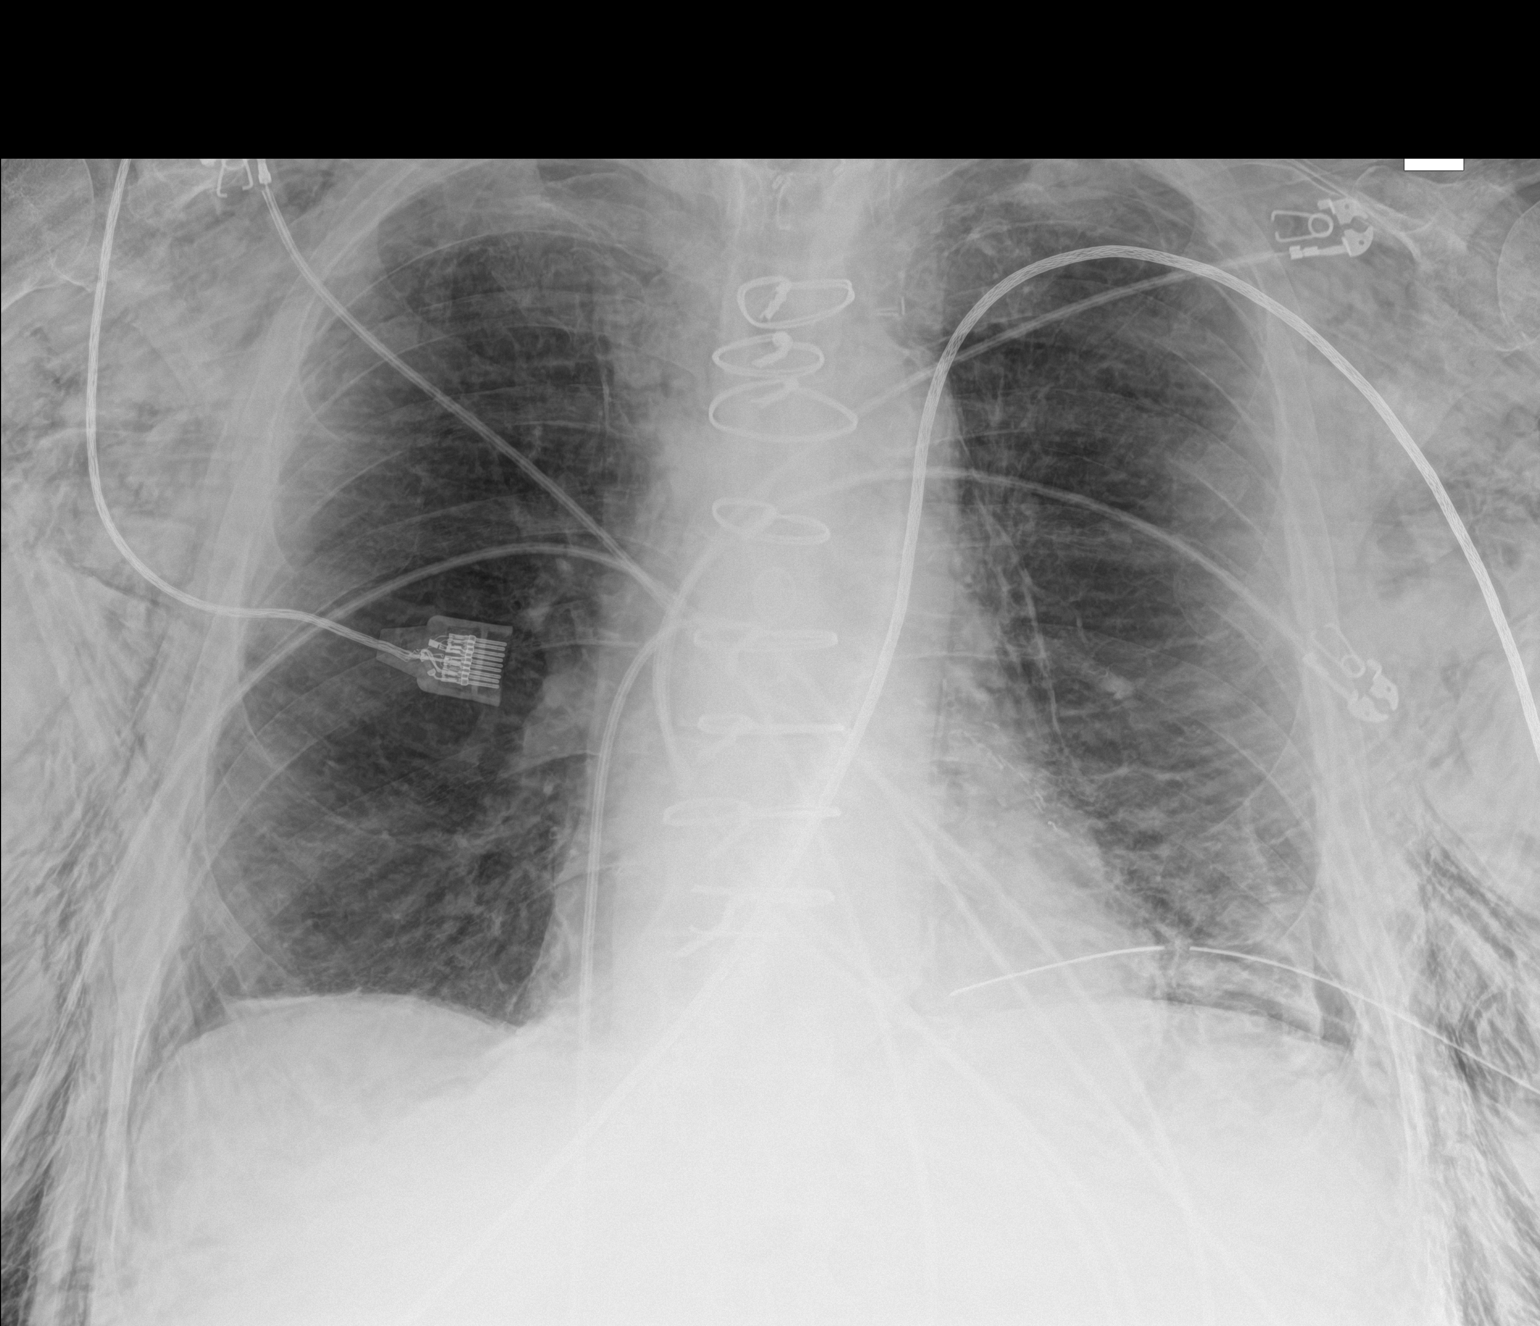

[1 of 1 positions shown; findings below may reference images not displayed]

FINDINGS: Left chest tube in stable position. Small left pneumothorax is again
noted on today's exam. Pneumomediastinum. Bibasilar atelectasis.
Prior CABG. Heart size stable. Prior cervicothoracic spine fusion.
Diffuse severe bilateral chest wall and neck subcutaneous emphysema
is again noted.
IMPRESSION: 1. Left chest tube in stable position. Tiny left base pneumothorax
again noted on today's exam. Pneumomediastinum unchanged. Diffuse
chest wall and neck subcutaneous emphysema unchanged.

2.  Bibasilar atelectasis.

3.  Prior CABG.  Heart size stable.

## 2019-09-20 IMAGING — CT CT PERC PLEURAL DRAIN W/INDWELL CATH W/IMG GUIDE
1 series · 1 of 1 positions shown · non-contrast
Comparison: none

CLINICAL DATA: Persistent progressive subcutaneous emphysema after
median sternotomy despite no evidence of air leak from surgical left
chest tube.

[Series 1: topogram 0.6 t20f · coronal · 1.00mm/px · 1 of 1 slices shown]
[im 1/1]
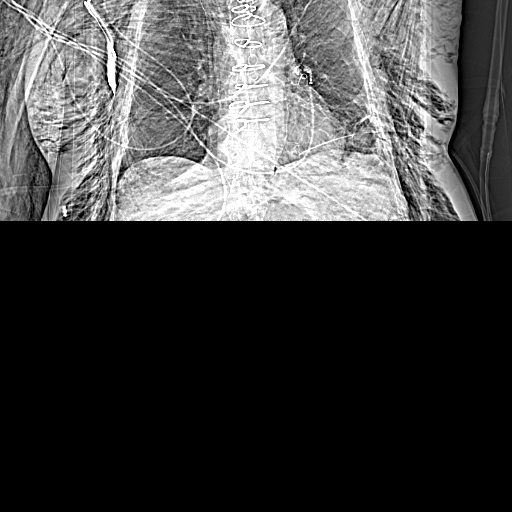

[1 of 1 positions shown; findings below may reference images not displayed]

EXAM:
CT GUIDED LEFT CHEST TUBE PLACEMENT

ANESTHESIA/SEDATION:
Intravenous Fentanyl 597mcg and Versed 1.5mg were administered as
conscious sedation during continuous monitoring of the patient's
level of consciousness and physiological / cardiorespiratory status
by the radiology RN, with a total moderate sedation time of of 18
minutes.

PROCEDURE:
The procedure, risks, benefits, and alternatives were explained to
the patient. Questions regarding the procedure were encouraged and
answered. The patient understands and consents to the procedure.

Select axial scans through the thorax were obtained. An appropriate
skin entry site to access the residual left lateral pneumothorax was
identified and marked.

The operative field was prepped with chlorhexidinein a sterile
fashion, and a sterile drape was applied covering the operative
field. A sterile gown and sterile gloves were used for the
procedure. Local anesthesia was provided with 1% Lidocaine.

Under CT fluoroscopic guidance, a 18 gauge x 10 cm trocar needle was
advanced into the residual left lateral pneumothorax at the lung
base. An Amplatz guidewire advanced easily, its position confirmed
on CT. Tract dilated to facilitate placement of a 14 French pigtail
drain catheter, placed laterally in the left hemithorax. Catheter
position was confirmed on CT. Catheter secured externally with 0
Prolene suture and StatLock and placed to Pleur-evac drainage. The
patient tolerated the procedure well.

COMPLICATIONS:
None immediate
FINDINGS: Small anterior and lateral left residual pneumothorax was
identified. 14 French pigtail drain catheter placed as detailed
above.
IMPRESSION: 1. Technically successful CT-guided left chest tube placement.

## 2019-09-20 IMAGING — CT CT CHEST WITHOUT CONTRAST
2 of 3 series · 15 of 36 positions shown, 18 images · non-contrast
Comparison: CT 09/12/2018 and chest x-ray today.

CLINICAL DATA: Known pneumothorax for follow-up.  CABG 10/04/2018.

EXAM:
CT CHEST WITHOUT CONTRAST
TECHNIQUE: Multidetector CT imaging of the chest was performed following the
standard protocol without IV contrast.

[Series 4: thorax 2.0 · axial · 0.82mm/px · z∈[+1324,+1600]mm · 12 of 163 slices shown, 15 images]
[im 13/163  mediastinal]
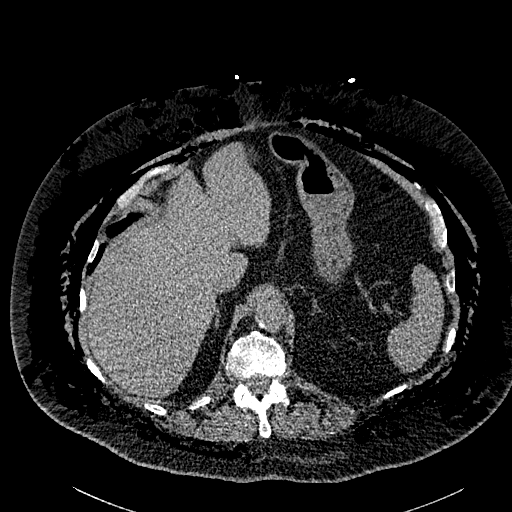
[im 13/163  lung]
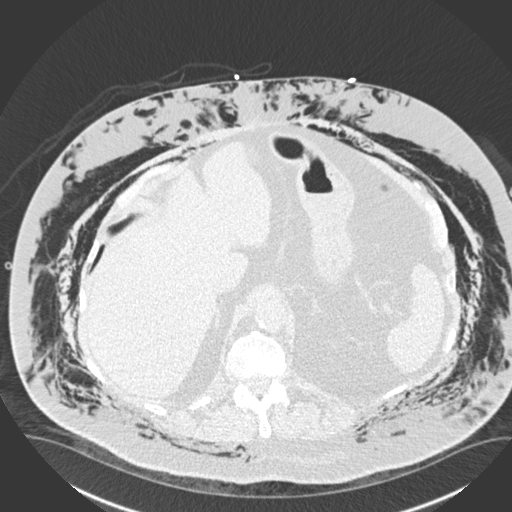
[im 25/163  lung]
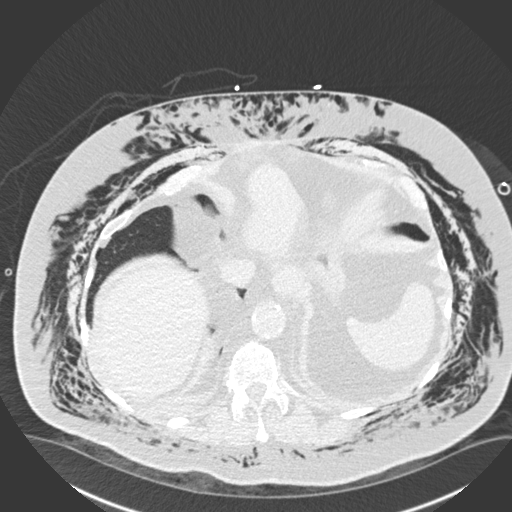
[im 37/163  lung]
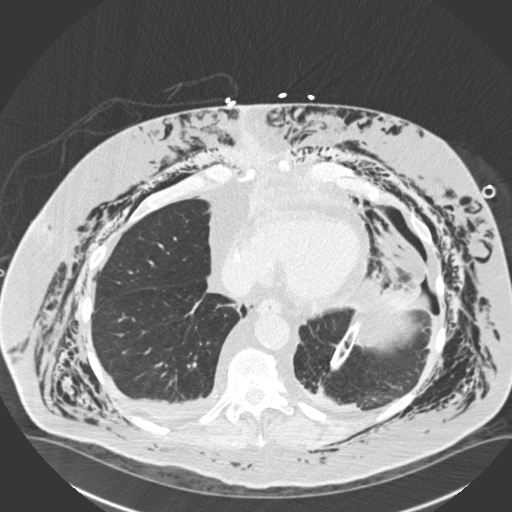
[im 49/163  lung]
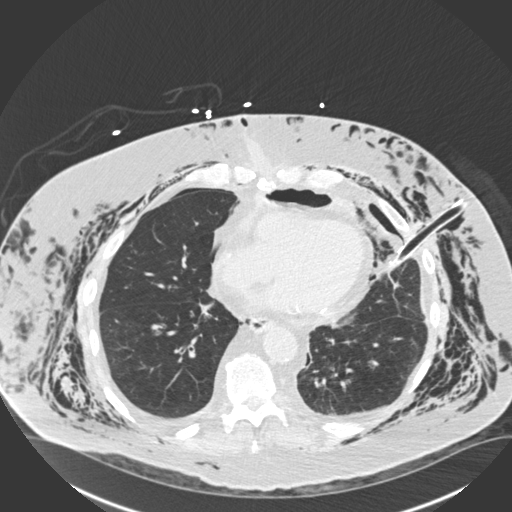
[im 61/163  mediastinal]
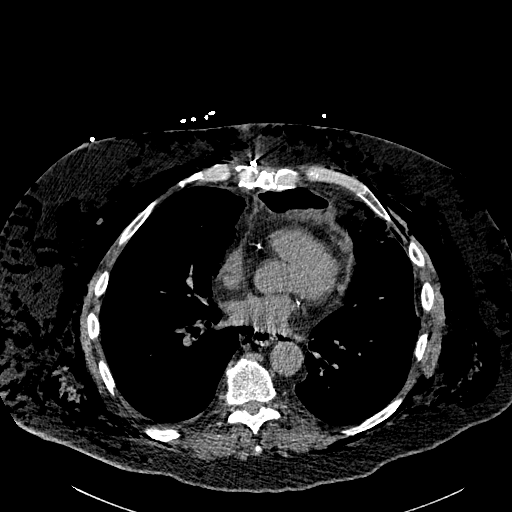
[im 61/163  lung]
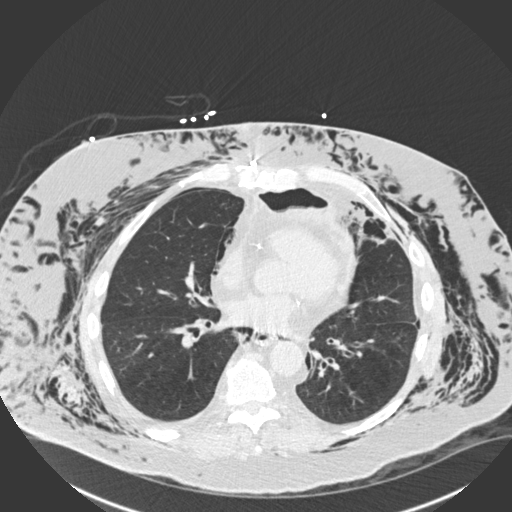
[im 73/163  lung]
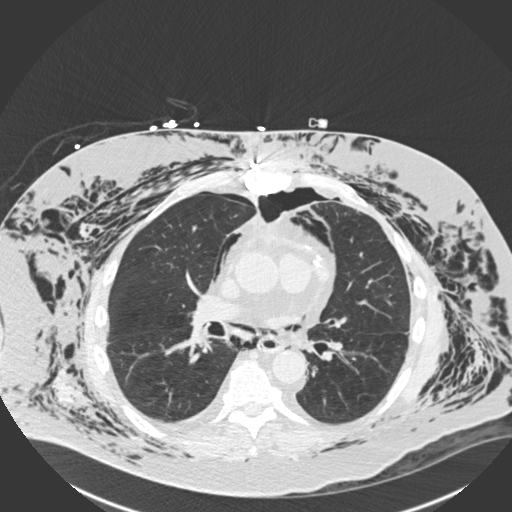
[im 91/163  lung]
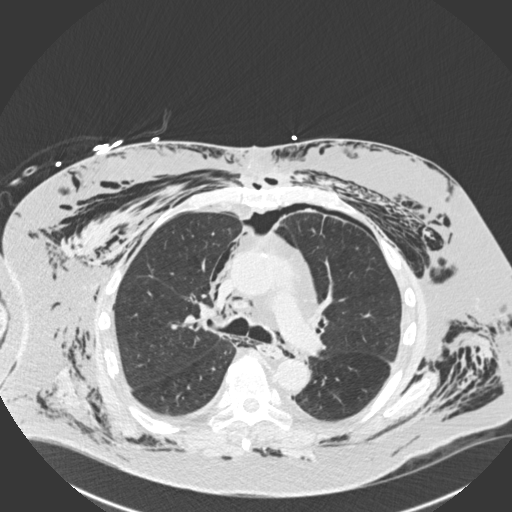
[im 103/163  lung]
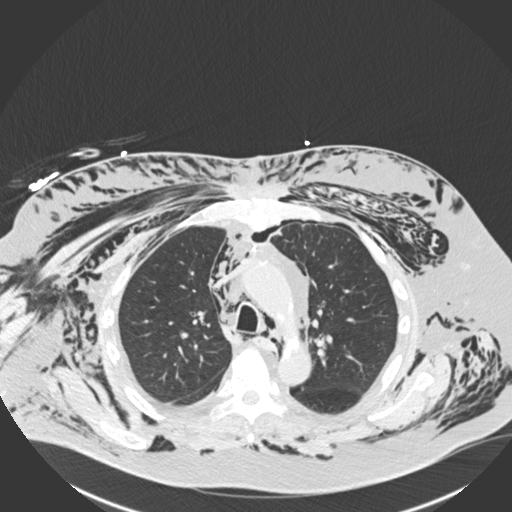
[im 115/163  mediastinal]
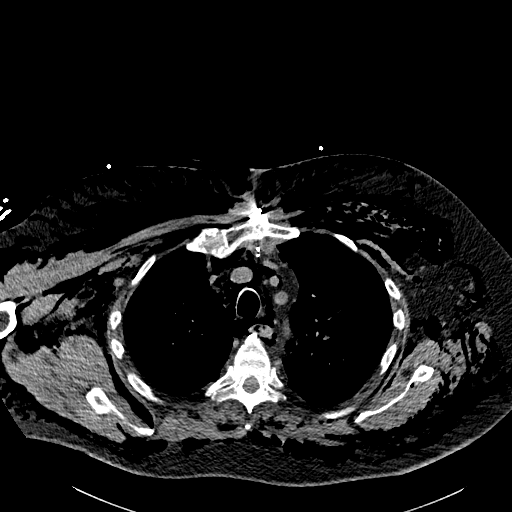
[im 115/163  lung]
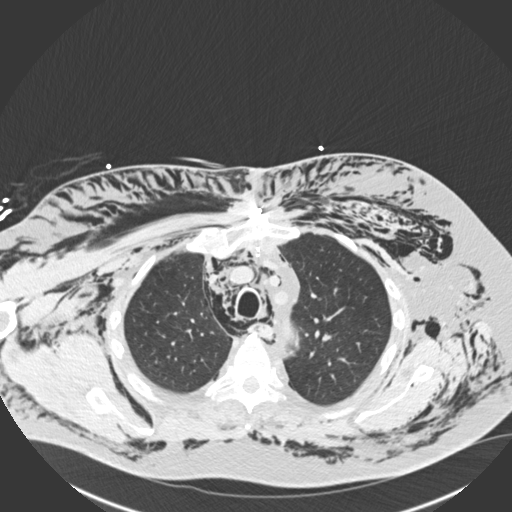
[im 127/163  lung]
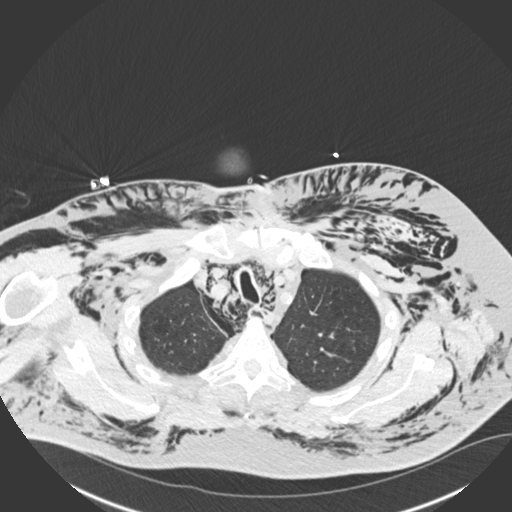
[im 139/163  lung]
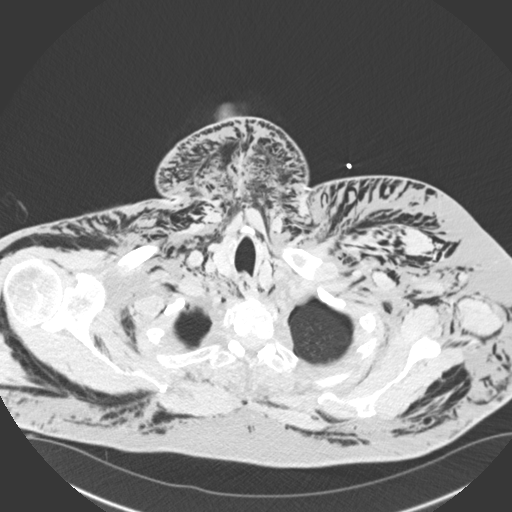
[im 151/163  lung]
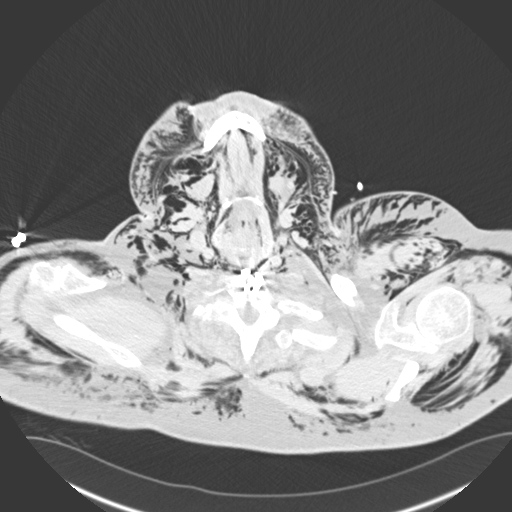

[Series 6: coronal · coronal · 0.63mm/px · 3 of 101 slices shown]
[im 21/101  lung]
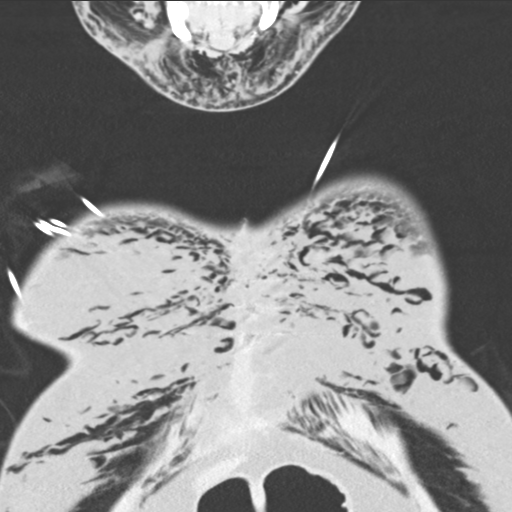
[im 41/101  lung]
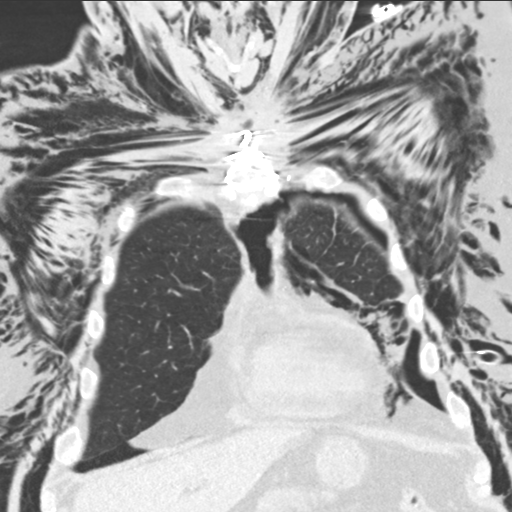
[im 61/101  lung]
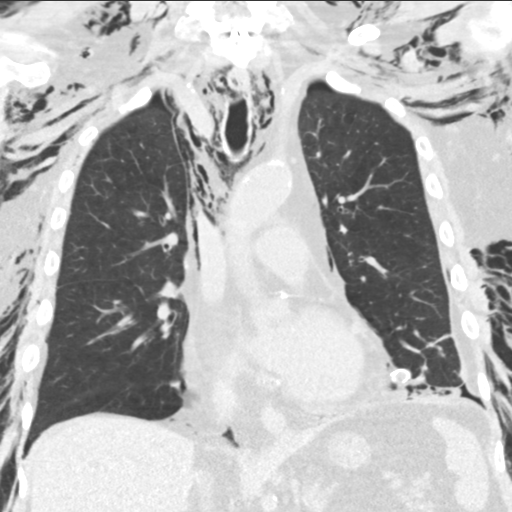

[15 of 36 positions shown; findings below may reference images not displayed]

FINDINGS: Cardiovascular: Heart is normal size. Evidence of previous CABG.
Thoracic aorta is normal in caliber. Mild calcified plaque involving
the thoracic aorta.

Mediastinum/Nodes: No pneumomediastinum. No mediastinal or hilar
adenopathy.

Lungs/Pleura: Known small bilateral pneumothoraces are present.
Left-sided chest tube is present with tip over the posterior left
lung base adjacent the diaphragm. Posterior bibasilar atelectasis
present. There is atelectasis over the lingula adjacent the
left-sided chest tube. No significant effusion.

Upper Abdomen: Calcified plaque over the abdominal aorta. Small
focus of air posterior to the distal stomach on the most inferior
images as this is incompletely evaluated.

Musculoskeletal: Extensive subcutaneous emphysema throughout the
thorax unchanged from recent chest radiograph. Mild amount of fluid
in air superficial to the median sternotomy. Subcutaneous fluid
throughout the thorax. Degenerative change of the spine. Schmorl's
node over the superior endplate of a lower thoracic vertebral body.
IMPRESSION: 1. Known small bilateral pneumothoraces with left-sided chest tube
in place as described. Mild posterior dependent atelectasis and
lingular atelectasis. Known pneumomediastinum and stable extensive
subcutaneous emphysema and fluid over the thorax.

2. Recent CABG. Mild fluid and air immediately superficial to the
sternotomy site likely postsurgical changes and less likely
infection.

3.  Aortic Atherosclerosis ([id number]).

## 2019-09-21 IMAGING — DX PORTABLE CHEST - 1 VIEW
1 series · 1 of 1 positions shown · non-contrast
Comparison: Radiograph October 22, 2018.

CLINICAL DATA: Shortness of breath.

EXAM:
PORTABLE CHEST 1 VIEW

[chest ap]
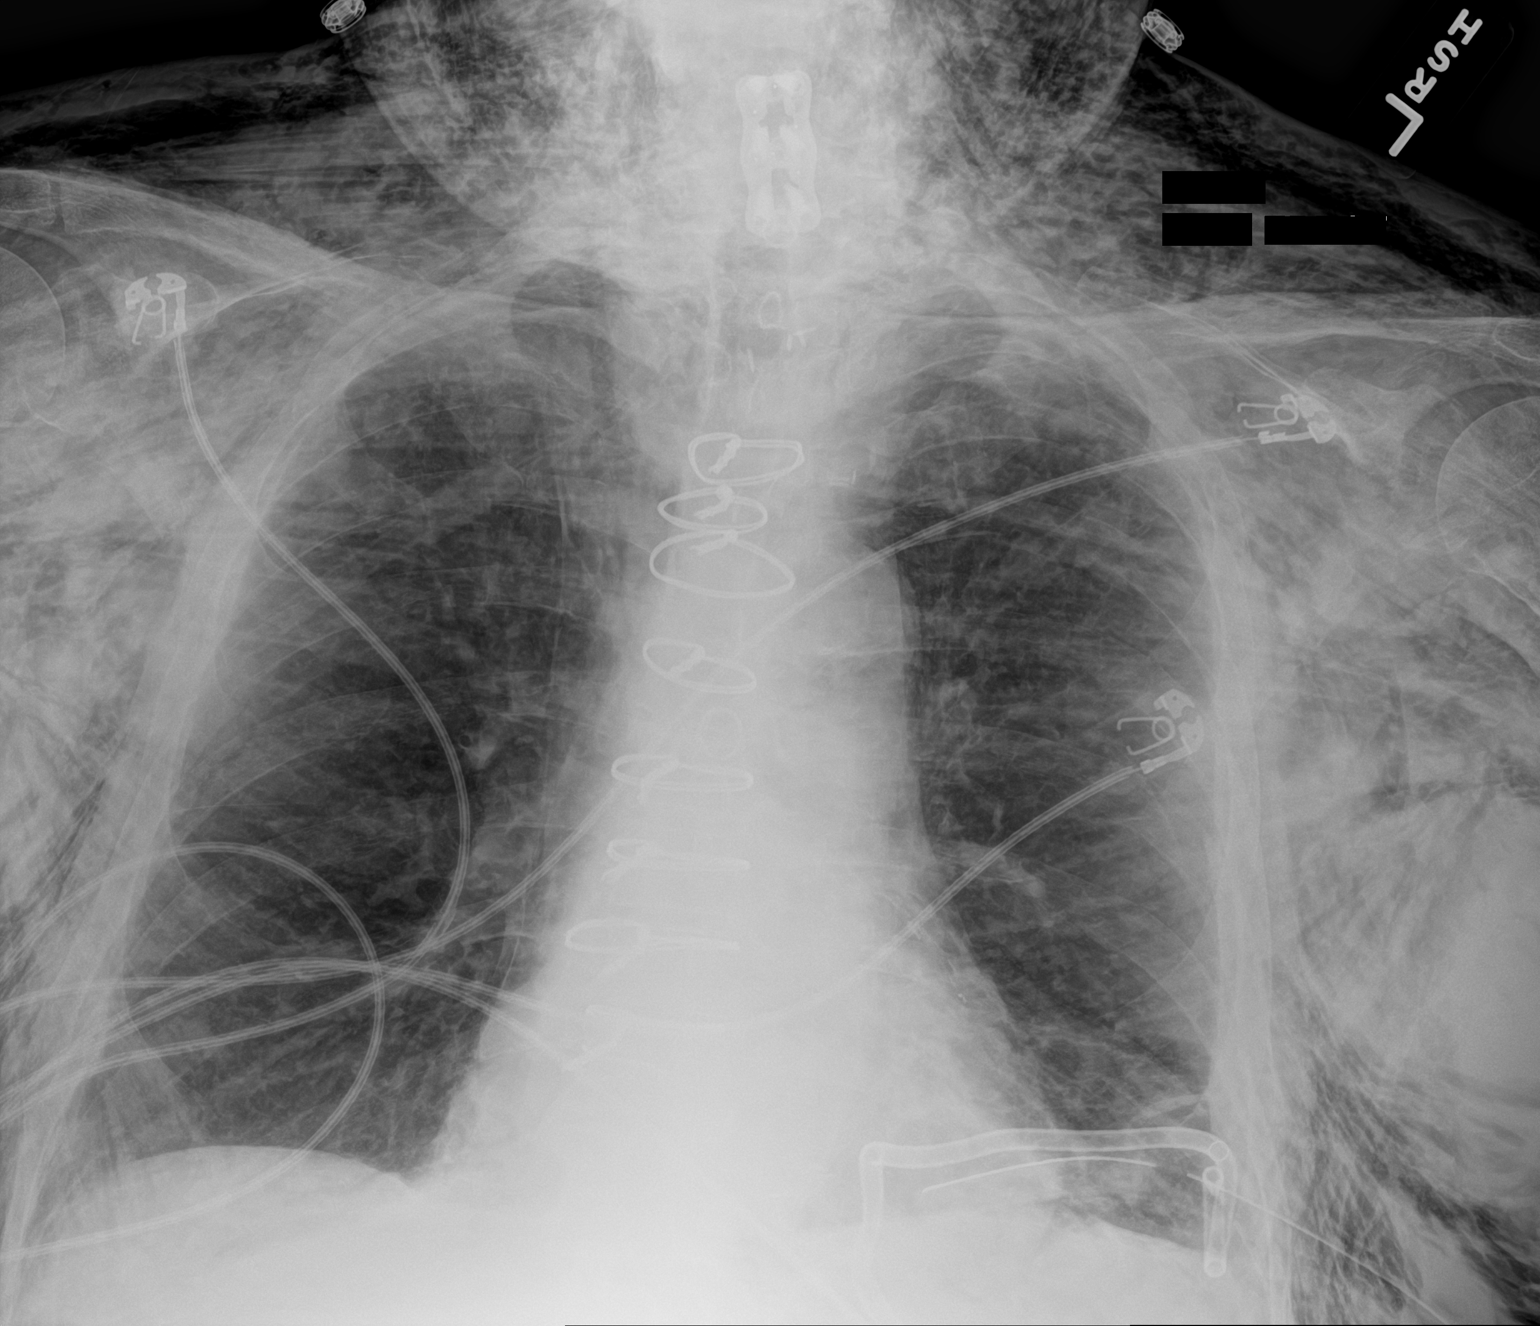

[1 of 1 positions shown; findings below may reference images not displayed]

FINDINGS: Stable cardiomediastinal silhouette. Stable extensive subcutaneous
emphysema is noted bilaterally. Stable position of previously placed
left basilar chest tube. New pigtail catheter is seen in left lung
base. Left basilar pneumothorax noted on prior exam is no longer
visualized. Bony thorax is unremarkable.
IMPRESSION: Interval placement of new pigtail catheter into left lung base. Left
basilar pneumothorax noted on prior exam is not well visualized. No
definite pneumothorax is seen currently, although this may be
obscured due to extensive overlying subcutaneous emphysema.

## 2019-12-09 ENCOUNTER — Ambulatory Visit (INDEPENDENT_AMBULATORY_CARE_PROVIDER_SITE_OTHER): Payer: Medicare Other | Admitting: Surgery

## 2019-12-09 ENCOUNTER — Encounter: Payer: Self-pay | Admitting: Surgery

## 2019-12-09 ENCOUNTER — Other Ambulatory Visit: Payer: Self-pay

## 2019-12-09 ENCOUNTER — Ambulatory Visit (HOSPITAL_COMMUNITY)
Admission: RE | Admit: 2019-12-09 | Discharge: 2019-12-09 | Disposition: A | Discharge status: Home / Self Care | Payer: Medicare Other | Source: Ambulatory Visit | Attending: Surgery | Admitting: Surgery

## 2019-12-09 VITALS — BP 162/89 | HR 53 | Temp 98.2°F | Resp 20 | Ht 69.0 in | Wt 194.0 lb

## 2019-12-09 DIAGNOSIS — I714 Abdominal aortic aneurysm, without rupture, unspecified: Secondary | ICD-10-CM

## 2019-12-09 DIAGNOSIS — Z95828 Presence of other vascular implants and grafts: Secondary | ICD-10-CM | POA: Diagnosis present

## 2019-12-09 NOTE — Progress Notes (Signed)
[facility] [place]  Patient name: Melvin Brewer MRN: [id number] DOB: 1950/06/30 Sex: male   REASON FOR VISIT:    Follow up  HISOTRY OF PRESENT ILLNESS:    Melvin Brewer is a 69 y.o. male who on 05/03/2019  underwent endovascular repair of a 5.1 cm infrarenal abdominal aortic aneurysm, without incident.  He was seen in the office several times for bilateral groin pain after his operation.  His groin pain has now resolved.  His post op CTA showed good position of the stent with no endoleak.   Patient suffers from coronary artery disease. He has undergone CABG in the past.. He suffers from COPD. He is a diabetic. He is a former smoker. He is medically managed for hypertension and takes a statin for hypercholesterolemia.  He does get occasional leg weakness with Crestor.  PAST MEDICAL HISTORY:   Past Medical History:  Diagnosis Date  . AAA (abdominal aortic aneurysm) ([place])   . Abdominal aortic aneurysm (AAA) ([place])   . Allergic rhinitis   . Anginal pain ([place])   . Centrilobular emphysema ([place])   . Cervical spine degeneration   . CHF (congestive heart failure) ([place])   . COPD (chronic obstructive pulmonary disease) ([place])   . Coronary artery disease   . Diabetes mellitus without complication ([place])   . Diverticulitis   . DJD (degenerative joint disease)   . Early cataract   . Eczema   . GERD (gastroesophageal reflux disease)   . H/O hiatal hernia    AGE 38 S  NO MEDS  . Hemorrhoids   . Herpes simplex infection   . Hypercholesterolemia   . Hyperglycemia   . Hyperlipidemia   . Hypertension   . Macular degeneration    right eye  . Obesity   . Shortness of breath    W/ EXERTION   . Wears glasses   . Wears partial dentures      FAMILY HISTORY:   History reviewed. No pertinent family history.  SOCIAL HISTORY:   Social History   Tobacco Use  . Smoking status: Former Smoker    Packs/day: 2.00     Years: 43.00    Pack years: 86.00    Types: Cigarettes    Quit date: 04/11/2004    Years since quitting: 15.6  . Smokeless tobacco: Never Used  Substance Use Topics  . Alcohol use: Yes    Alcohol/week: 7.0 standard drinks    Types: 7 Standard drinks or equivalent per week     ALLERGIES:   Allergies  Allergen Reactions  . Atorvastatin Other (See Comments)    Leg cramps  . Sulfa Antibiotics Rash    Sun Rash      CURRENT MEDICATIONS:   Current Outpatient Medications  Medication Sig Dispense Refill  . albuterol (PROVENTIL HFA;VENTOLIN HFA) 108 (90 BASE) MCG/ACT inhaler Inhale 2 puffs into the lungs every 6 (six) hours as needed for wheezing or shortness of breath.    Marland Kitchen albuterol (PROVENTIL) (2.5 MG/3ML) 0.083% nebulizer solution Inhale 2.5 mg into the lungs every 6 (six) hours as needed for wheezing or shortness of breath.    Marland Kitchen aspirin EC 81 MG tablet Take 81 mg by mouth every evening.     Marland Kitchen azelastine (ASTELIN) 0.1 % nasal spray Place 1 spray into both nostrils daily as needed for allergies. Use in each nostril as directed     . Cholecalciferol (VITAMIN D3) 50 MCG (2000 UT) TABS Take 2,000 Units by mouth  daily.    . DENTA 5000 PLUS 1.1 % CREA dental cream Take 1 application by mouth 2 (two) times daily. Brush teeth for at least 1 min  5  . fexofenadine (ALLEGRA) 180 MG tablet Take 180 mg by mouth daily.    . fluticasone (FLONASE) 50 MCG/ACT nasal spray Place 2 sprays into both nostrils daily.     . furosemide (LASIX) 20 MG tablet Take 20 mg by mouth as needed.    Marland Kitchen losartan (COZAAR) 25 MG tablet Take 25 mg by mouth daily.    . metFORMIN (GLUCOPHAGE) 500 MG tablet Take 500 mg by mouth daily.    . metoprolol tartrate (LOPRESSOR) 25 MG tablet Take 1 tablet (25 mg total) by mouth 2 (two) times daily. 60 tablet 3  . montelukast (SINGULAIR) 10 MG tablet Take 10 mg by mouth every evening.    . potassium chloride 20 MEQ TBCR Take 20 mEq by mouth daily. (Patient taking differently: Take 20  mEq by mouth as needed. With lasix) 30 tablet 3  . Roflumilast (DALIRESP) 250 MCG TABS Take 250 mcg by mouth every evening.    . rosuvastatin (CRESTOR) 40 MG tablet Take 40 mg by mouth daily.    . sildenafil (REVATIO) 20 MG tablet Take 20-100 mg by mouth daily as needed for erectile dysfunction.    . SYMBICORT 160-4.5 MCG/ACT inhaler Inhale 2 puffs into the lungs 2 (two) times a day.    . Tiotropium Bromide Monohydrate (SPIRIVA RESPIMAT) 2.5 MCG/ACT AERS Inhale 2 puffs into the lungs daily.     . valACYclovir (VALTREX) 500 MG tablet Take 500 mg by mouth every evening.     No current facility-administered medications for this visit.    REVIEW OF SYSTEMS:   [X]  denotes positive finding, [ ]  denotes negative finding Cardiac  Comments:  Chest pain or chest pressure:    Shortness of breath upon exertion:    Short of breath when lying flat:    Irregular heart rhythm:        Vascular    Pain in calf, thigh, or hip brought on by ambulation:    Pain in feet at night that wakes you up from your sleep:     Blood clot in your veins:    Leg swelling:         Pulmonary    Oxygen at home:    Productive cough:     Wheezing:         Neurologic    Sudden weakness in arms or legs:     Sudden numbness in arms or legs:     Sudden onset of difficulty speaking or slurred speech:    Temporary loss of vision in one eye:     Problems with dizziness:         Gastrointestinal    Blood in stool:     Vomited blood:         Genitourinary    Burning when urinating:     Blood in urine:        Psychiatric    Major depression:         Hematologic    Bleeding problems:    Problems with blood clotting too easily:        Skin    Rashes or ulcers:        Constitutional    Fever or chills:      PHYSICAL EXAM:   Vitals:   12/09/19 0827  BP: (!) 162/89  Pulse: (!) 53  Resp: 20  Temp: 98.2 F (36.8 C)  SpO2: 96%  Weight: 194 lb (88 kg)  Height: 5\' 9"  (1.753 m)    GENERAL: The patient  is a well-nourished male, in no acute distress. The vital signs are documented above. CARDIAC: There is a regular rate and rhythm.  VASCULAR: Palpable pedal pulses PULMONARY: Non-labored respirations ABDOMEN: Soft and non-tender  MUSCULOSKELETAL: There are no major deformities or cyanosis. NEUROLOGIC: No focal weakness or paresthesias are detected. SKIN: There are no ulcers or rashes noted. PSYCHIATRIC: The patient has a normal affect.  STUDIES:   I have reviewed his duplex from today which shows no endoleak and a maximal diameter of 4.95 cm  MEDICAL ISSUES:   AAA: Maximum diameter has decreased from 5.1 down to 4.95 cm on ultrasound today.  He has no evidence of endoleak.  He will follow-up in 1 year with surveillance ultrasound   Carotid: Preoperative Dopplers showed 1-39% stenosis bilaterally.  No need for follow-up    Leia Alf, MD, FACS [facility] [phone] Pager [phone]

## 2020-12-16 ENCOUNTER — Other Ambulatory Visit: Payer: Self-pay | Admitting: *Deleted

## 2020-12-16 DIAGNOSIS — I714 Abdominal aortic aneurysm, without rupture, unspecified: Secondary | ICD-10-CM

## 2020-12-21 ENCOUNTER — Other Ambulatory Visit: Payer: Self-pay

## 2020-12-21 ENCOUNTER — Other Ambulatory Visit: Payer: Self-pay | Admitting: Surgery

## 2020-12-21 ENCOUNTER — Ambulatory Visit (INDEPENDENT_AMBULATORY_CARE_PROVIDER_SITE_OTHER): Payer: Medicare Other | Admitting: Surgery

## 2020-12-21 ENCOUNTER — Ambulatory Visit (HOSPITAL_COMMUNITY)
Admission: RE | Admit: 2020-12-21 | Discharge: 2020-12-21 | Disposition: A | Discharge status: Home / Self Care | Payer: Medicare Other | Source: Ambulatory Visit | Attending: Surgery | Admitting: Surgery

## 2020-12-21 ENCOUNTER — Encounter: Payer: Self-pay | Admitting: Surgery

## 2020-12-21 VITALS — BP 128/79 | HR 60 | Temp 98.2°F | Resp 20 | Ht 69.0 in | Wt 197.0 lb

## 2020-12-21 DIAGNOSIS — I714 Abdominal aortic aneurysm, without rupture, unspecified: Secondary | ICD-10-CM

## 2020-12-21 NOTE — Progress Notes (Signed)
[facility] and [facility]in Specialist of [place]  Patient name: Melvin Brewer MRN: [id number] DOB: March 08, 1951 Sex: male   REASON FOR VISIT:    Follow up  HISOTRY OF PRESENT ILLNESS:    Melvin Brewer is a 70 y.o. male who on 05/03/2019  underwent endovascular repair of a 5.1 cm infrarenal abdominal aortic aneurysm, without incident.  He was seen in the office several times for bilateral groin pain after his operation.  His groin pain has now resolved.  His post op CTA showed good position of the stent with no endoleak.  He denies any abdominal pain or back pain.  He has no complaints today.     Patient suffers from coronary artery disease.  He has undergone CABG in the past.. He suffers from COPD.  He is a diabetic.  He is a former smoker.  He is medically managed for hypertension and takes a statin for hypercholesterolemia.  He does get occasional leg weakness with Crestor.  PAST MEDICAL HISTORY:   Past Medical History:  Diagnosis Date   AAA (abdominal aortic aneurysm) ([facility])    Abdominal aortic aneurysm (AAA) ([facility])    Allergic rhinitis    Anginal pain ([facility])    Centrilobular emphysema ([facility])    Cervical spine degeneration    CHF (congestive heart failure) ([facility])    COPD (chronic obstructive pulmonary disease) ([facility])    Coronary artery disease    Diabetes mellitus without complication ([facility])    Diverticulitis    DJD (degenerative joint disease)    Early cataract    Eczema    GERD (gastroesophageal reflux disease)    H/O hiatal hernia    AGE 61 S  NO MEDS   Hemorrhoids    Herpes simplex infection    Hypercholesterolemia    Hyperglycemia    Hyperlipidemia    Hypertension    Macular degeneration    right eye   Obesity    Shortness of breath    W/ EXERTION    Wears glasses    Wears partial dentures      FAMILY HISTORY:   History reviewed. No pertinent family history.  SOCIAL HISTORY:   Social History   Tobacco Use   Smoking  status: Former    Packs/day: 2.00    Years: 43.00    Pack years: 86.00    Types: Cigarettes    Quit date: 04/11/2004    Years since quitting: 16.7   Smokeless tobacco: Never  Substance Use Topics   Alcohol use: Yes    Alcohol/week: 7.0 standard drinks    Types: 7 Standard drinks or equivalent per week     ALLERGIES:   Allergies  Allergen Reactions   Atorvastatin Other (See Comments)    Leg cramps   Sulfa Antibiotics Rash    Sun Rash      CURRENT MEDICATIONS:   Current Outpatient Medications  Medication Sig Dispense Refill   albuterol (PROVENTIL HFA;VENTOLIN HFA) 108 (90 BASE) MCG/ACT inhaler Inhale 2 puffs into the lungs every 6 (six) hours as needed for wheezing or shortness of breath.     albuterol (PROVENTIL) (2.5 MG/3ML) 0.083% nebulizer solution Inhale 2.5 mg into the lungs every 6 (six) hours as needed for wheezing or shortness of breath.     aspirin EC 81 MG tablet Take 81 mg by mouth every evening.      azelastine (ASTELIN) 0.1 % nasal spray Place 1 spray into both nostrils daily as needed for allergies. Use in each nostril as  directed      BREO ELLIPTA 100-25 MCG/INH AEPB Inhale 1 puff into the lungs daily.     Cholecalciferol (VITAMIN D3) 50 MCG (2000 UT) TABS Take 2,000 Units by mouth daily.     DENTA 5000 PLUS 1.1 % CREA dental cream Take 1 application by mouth 2 (two) times daily. Brush teeth for at least 1 min  5   fexofenadine (ALLEGRA) 180 MG tablet Take 180 mg by mouth daily.     fluticasone (FLONASE) 50 MCG/ACT nasal spray Place 2 sprays into both nostrils daily.      furosemide (LASIX) 20 MG tablet Take 20 mg by mouth as needed.     losartan (COZAAR) 25 MG tablet Take 25 mg by mouth daily.     metFORMIN (GLUCOPHAGE) 500 MG tablet Take 500 mg by mouth daily.     metoprolol tartrate (LOPRESSOR) 25 MG tablet Take 1 tablet (25 mg total) by mouth 2 (two) times daily. 60 tablet 3   montelukast (SINGULAIR) 10 MG tablet Take 10 mg by mouth every evening.      potassium chloride 20 MEQ TBCR Take 20 mEq by mouth daily. (Patient taking differently: Take 20 mEq by mouth as needed. With lasix) 30 tablet 3   Roflumilast (DALIRESP) 250 MCG TABS Take 250 mcg by mouth every evening.     rosuvastatin (CRESTOR) 40 MG tablet Take 40 mg by mouth daily.     Tiotropium Bromide Monohydrate 2.5 MCG/ACT AERS Inhale 2 puffs into the lungs daily.      valACYclovir (VALTREX) 500 MG tablet Take 500 mg by mouth every evening.     No current facility-administered medications for this visit.    REVIEW OF SYSTEMS:   '[X]'$  denotes positive finding, '[ ]'$  denotes negative finding Cardiac  Comments:  Chest pain or chest pressure:    Shortness of breath upon exertion:    Short of breath when lying flat:    Irregular heart rhythm:        Vascular    Pain in calf, thigh, or hip brought on by ambulation:    Pain in feet at night that wakes you up from your sleep:     Blood clot in your veins:    Leg swelling:         Pulmonary    Oxygen at home:    Productive cough:     Wheezing:         Neurologic    Sudden weakness in arms or legs:     Sudden numbness in arms or legs:     Sudden onset of difficulty speaking or slurred speech:    Temporary loss of vision in one eye:     Problems with dizziness:         Gastrointestinal    Blood in stool:     Vomited blood:         Genitourinary    Burning when urinating:     Blood in urine:        Psychiatric    Major depression:         Hematologic    Bleeding problems:    Problems with blood clotting too easily:        Skin    Rashes or ulcers:        Constitutional    Fever or chills:      PHYSICAL EXAM:   Vitals:   12/21/20 0928  BP: 128/79  Pulse: 60  Resp: 20  Temp:  98.2 F (36.8 C)  SpO2: 96%  Weight: 89.4 kg  Height: '5\' 9"'$  (1.753 m)    GENERAL: The patient is a well-nourished male, in no acute distress. The vital signs are documented above. CARDIAC: There is a regular rate and rhythm.   VASCULAR: Palpable pedal pulses PULMONARY: Non-labored respirations ABDOMEN: Soft and non-tender  MUSCULOSKELETAL: There are no major deformities or cyanosis. NEUROLOGIC: No focal weakness or paresthesias are detected. SKIN: There are no ulcers or rashes noted. PSYCHIATRIC: The patient has a normal affect.  STUDIES:   I have reviewed his duplex with the following findings: Abdominal Aorta: Patent endovascular aneurysm repair with no evidence of  endoleak. The largest aortic diameter remains essentially unchanged  compared to prior exam. Previous diameter measurement was 4.95 cm obtained  on 12/08/2020.   MEDICAL ISSUES:   AAA: Maximum aortic diameter is unchanged at 5 cm.  There is no evidence of endoleak.  He will follow-up in 1 year with surveillance imaging.    Leia Alf, MD, FACS [facility] [phone] Pager [phone]

## 2021-01-18 ENCOUNTER — Encounter: Payer: Self-pay | Admitting: Ophthalmology

## 2021-01-21 NOTE — Discharge Instructions (Signed)

## 2021-01-26 ENCOUNTER — Other Ambulatory Visit: Payer: Self-pay

## 2021-01-26 ENCOUNTER — Encounter: Payer: Self-pay | Admitting: Ophthalmology

## 2021-01-26 ENCOUNTER — Ambulatory Visit: Payer: Medicare Other | Admitting: Anesthesiology

## 2021-01-26 ENCOUNTER — Encounter
Admission: RE | Disposition: A | Discharge status: Home / Self Care | Payer: Self-pay | Source: Home / Self Care | Attending: Ophthalmology

## 2021-01-26 ENCOUNTER — Ambulatory Visit
Admission: RE | Admit: 2021-01-26 | Discharge: 2021-01-26 | Disposition: A | Discharge status: Home / Self Care | Payer: Medicare Other | Attending: Ophthalmology | Admitting: Ophthalmology

## 2021-01-26 DIAGNOSIS — E1136 Type 2 diabetes mellitus with diabetic cataract: Secondary | ICD-10-CM | POA: Insufficient documentation

## 2021-01-26 DIAGNOSIS — Z7984 Long term (current) use of oral hypoglycemic drugs: Secondary | ICD-10-CM | POA: Insufficient documentation

## 2021-01-26 DIAGNOSIS — Z7982 Long term (current) use of aspirin: Secondary | ICD-10-CM | POA: Diagnosis not present

## 2021-01-26 DIAGNOSIS — Z79899 Other long term (current) drug therapy: Secondary | ICD-10-CM | POA: Insufficient documentation

## 2021-01-26 DIAGNOSIS — H2511 Age-related nuclear cataract, right eye: Secondary | ICD-10-CM | POA: Diagnosis not present

## 2021-01-26 DIAGNOSIS — Z87891 Personal history of nicotine dependence: Secondary | ICD-10-CM | POA: Insufficient documentation

## 2021-01-26 HISTORY — PX: CATARACT EXTRACTION W/PHACO: SHX586

## 2021-01-26 LAB — GLUCOSE, CAPILLARY
Glucose-Capillary: 93 mg/dL (ref 70–99)
Glucose-Capillary: 92 mg/dL (ref 70–99)

## 2021-01-26 SURGERY — PHACOEMULSIFICATION, CATARACT, WITH IOL INSERTION
Anesthesia: Monitor Anesthesia Care | Site: Eye | Laterality: Right

## 2021-01-26 MED ORDER — TETRACAINE HCL 0.5 % OP SOLN
1.0000 [drp] | OPHTHALMIC | Status: DC | PRN
  Administered 2021-01-26 (×3): 1 [drp] via OPHTHALMIC

## 2021-01-26 MED ORDER — SIGHTPATH DOSE#1 BSS IO SOLN
INTRAOCULAR | Status: DC | PRN
  Administered 2021-01-26: 15 mL via INTRAOCULAR

## 2021-01-26 MED ORDER — SIGHTPATH DOSE#1 BSS IO SOLN
INTRAOCULAR | Status: DC | PRN
  Administered 2021-01-26: 87 mL via OPHTHALMIC

## 2021-01-26 MED ORDER — ARMC OPHTHALMIC DILATING DROPS
1.0000 "application " | OPHTHALMIC | Status: DC | PRN
  Administered 2021-01-26 (×3): 1 via OPHTHALMIC

## 2021-01-26 MED ORDER — MIDAZOLAM HCL 2 MG/2ML IJ SOLN
INTRAMUSCULAR | Status: DC | PRN
  Administered 2021-01-26: 2 mg via INTRAVENOUS

## 2021-01-26 MED ORDER — SIGHTPATH DOSE#1 BSS IO SOLN
INTRAOCULAR | Status: DC | PRN
  Administered 2021-01-26: 2 mL

## 2021-01-26 MED ORDER — FENTANYL CITRATE (PF) 100 MCG/2ML IJ SOLN
INTRAMUSCULAR | Status: DC | PRN
  Administered 2021-01-26 (×2): 25 ug via INTRAVENOUS
  Administered 2021-01-26: 50 ug via INTRAVENOUS

## 2021-01-26 MED ORDER — BRIMONIDINE TARTRATE-TIMOLOL 0.2-0.5 % OP SOLN
OPHTHALMIC | Status: DC | PRN
  Administered 2021-01-26: 1 [drp] via OPHTHALMIC

## 2021-01-26 MED ORDER — MOXIFLOXACIN HCL 0.5 % OP SOLN
OPHTHALMIC | Status: DC | PRN
  Administered 2021-01-26: 0.2 mL via OPHTHALMIC

## 2021-01-26 MED ORDER — SIGHTPATH DOSE#1 NA CHONDROIT SULF-NA HYALURON 40-17 MG/ML IO SOLN
INTRAOCULAR | Status: DC | PRN
  Administered 2021-01-26: 1 mL via INTRAOCULAR

## 2021-01-26 SURGICAL SUPPLY — 19 items
CANNULA ANT/CHMB 27G (MISCELLANEOUS) IMPLANT
CANNULA ANT/CHMB 27GA (MISCELLANEOUS) IMPLANT
GLOVE SURG ENC TEXT LTX SZ8 (GLOVE) ×2 IMPLANT
GLOVE SURG TRIUMPH 8.0 PF LTX (GLOVE) ×2 IMPLANT
GOWN STRL REUS W/ TWL LRG LVL3 (GOWN DISPOSABLE) ×2 IMPLANT
GOWN STRL REUS W/TWL LRG LVL3 (GOWN DISPOSABLE) ×4
LENS IOL TECNIS EYHANCE 20.5 (Intraocular Lens) ×1 IMPLANT
MARKER SKIN DUAL TIP RULER LAB (MISCELLANEOUS) IMPLANT
NDL FILTER BLUNT 18X1 1/2 (NEEDLE) ×1 IMPLANT
NEEDLE FILTER BLUNT 18X 1/2SAF (NEEDLE) ×1
NEEDLE FILTER BLUNT 18X1 1/2 (NEEDLE) ×1 IMPLANT
PACK EYE AFTER SURG (MISCELLANEOUS) IMPLANT
RING MALYGIN (MISCELLANEOUS) IMPLANT
SUT ETHILON 10-0 CS-B-6CS-B-6 (SUTURE)
SUTURE EHLN 10-0 CS-B-6CS-B-6 (SUTURE) IMPLANT
SYR 3ML LL SCALE MARK (SYRINGE) ×2 IMPLANT
SYR TB 1ML LUER SLIP (SYRINGE) ×2 IMPLANT
WATER STERILE IRR 250ML POUR (IV SOLUTION) ×2 IMPLANT
WIPE NON LINTING 3.25X3.25 (MISCELLANEOUS) ×1 IMPLANT

## 2021-01-26 NOTE — Transfer of Care (Signed)
Immediate Anesthesia Transfer of Care Note  Patient: Melvin Brewer  Procedure(s) Performed: CATARACT EXTRACTION PHACO AND INTRAOCULAR LENS PLACEMENT (IOC) RIGHT DIABETIC 12.81 01:24.4 (Right: Eye)  Patient Location: PACU  Anesthesia Type: MAC  Level of Consciousness: awake, alert  and patient cooperative  Airway and Oxygen Therapy: Patient Spontanous Breathing and Patient connected to supplemental oxygen  Post-op Assessment: Post-op Vital signs reviewed, Patient's Cardiovascular Status Stable, Respiratory Function Stable, Patent Airway and No signs of Nausea or vomiting  Post-op Vital Signs: Reviewed and stable  Complications: No notable events documented.

## 2021-01-26 NOTE — Anesthesia Postprocedure Evaluation (Signed)
Anesthesia Post Note  Patient: Melvin Brewer  Procedure(s) Performed: CATARACT EXTRACTION PHACO AND INTRAOCULAR LENS PLACEMENT (IOC) RIGHT DIABETIC 12.81 01:24.4 (Right: Eye)     Patient location during evaluation: PACU Anesthesia Type: MAC Level of consciousness: awake and alert Pain management: pain level controlled Vital Signs Assessment: post-procedure vital signs reviewed and stable Respiratory status: spontaneous breathing, nonlabored ventilation and respiratory function stable Cardiovascular status: stable and blood pressure returned to baseline Postop Assessment: no apparent nausea or vomiting Anesthetic complications: no   No notable events documented.  Wanda Plump Pamella Samons

## 2021-01-26 NOTE — Anesthesia Preprocedure Evaluation (Signed)
Anesthesia Evaluation  Patient identified by MRN, date of birth, ID band Patient awake    Airway Mallampati: II  TM Distance: >3 FB     Dental   Pulmonary shortness of breath, COPD, former smoker,    breath sounds clear to auscultation       Cardiovascular hypertension, + angina + CAD and +CHF   Rhythm:Regular     Neuro/Psych    GI/Hepatic GERD  ,  Endo/Other  diabetes  Renal/GU      Musculoskeletal  (+) Arthritis ,   Abdominal   Peds  Hematology   Anesthesia Other Findings   Reproductive/Obstetrics                             Anesthesia Physical Anesthesia Plan  ASA: 3  Anesthesia Plan: MAC   Post-op Pain Management:    Induction: Intravenous  PONV Risk Score and Plan:   Airway Management Planned: Nasal Cannula  Additional Equipment:   Intra-op Plan:   Post-operative Plan:   Informed Consent: I have reviewed the patients History and Physical, chart, labs and discussed the procedure including the risks, benefits and alternatives for the proposed anesthesia with the patient or authorized representative who has indicated his/her understanding and acceptance.       Plan Discussed with: CRNA, Anesthesiologist and Surgeon  Anesthesia Plan Comments:         Anesthesia Quick Evaluation

## 2021-01-26 NOTE — Op Note (Signed)
PREOPERATIVE DIAGNOSIS:  Nuclear sclerotic cataract of the right eye.   POSTOPERATIVE DIAGNOSIS:  Cataract   OPERATIVE PROCEDURE:ORPROCALL@   SURGEON:  Birder Robson, MD.   ANESTHESIA:  Anesthesiologist: Carlos American, MD CRNA: Jeannene Patella, CRNA  1.      Managed anesthesia care. 2.      0.32ml of Shugarcaine was instilled in the eye following the paracentesis.   COMPLICATIONS:  None.   TECHNIQUE:   Stop and chop   DESCRIPTION OF PROCEDURE:  The patient was examined and consented in the preoperative holding area where the aforementioned topical anesthesia was applied to the right eye and then brought back to the Operating Room where the right eye was prepped and draped in the usual sterile ophthalmic fashion and a lid speculum was placed. A paracentesis was created with the side port blade and the anterior chamber was filled with viscoelastic. A near clear corneal incision was performed with the steel keratome. A continuous curvilinear capsulorrhexis was performed with a cystotome followed by the capsulorrhexis forceps. Hydrodissection and hydrodelineation were carried out with BSS on a blunt cannula. The lens was removed in a stop and chop  technique and the remaining cortical material was removed with the irrigation-aspiration handpiece. The capsular bag was inflated with viscoelastic and the Technis [id number]  lens was placed in the capsular bag without complication. The remaining viscoelastic was removed from the eye with the irrigation-aspiration handpiece. The wounds were hydrated. The anterior chamber was flushed with BSS and the eye was inflated to physiologic pressure. 0.75ml of Vigamox was placed in the anterior chamber. The wounds were found to be water tight. The eye was dressed with Combigan. The patient was given protective glasses to wear throughout the day and a shield with which to sleep tonight. The patient was also given drops with which to begin a drop regimen today  and will follow-up with me in one day. Implant Name Type Inv. Item Serial No. Manufacturer Lot No. LRB No. Used Action  LENS IOL TECNIS EYHANCE 20.5 - [id number] Intraocular Lens LENS IOL TECNIS EYHANCE 20.5  JOHNSON  [id number] Right 1 Implanted   Procedure(s) with comments: CATARACT EXTRACTION PHACO AND INTRAOCULAR LENS PLACEMENT (IOC) RIGHT DIABETIC 12.81 01:24.4 (Right) - Diabetic  Electronically signed: Birder Robson 01/26/2021 8:16 AM

## 2021-01-26 NOTE — Anesthesia Procedure Notes (Signed)
Procedure Name: MAC Date/Time: 01/26/2021 8:01 AM Performed by: Jeannene Patella, CRNA Pre-anesthesia Checklist: Patient identified, Emergency Drugs available, Suction available, Timeout performed and Patient being monitored Patient Re-evaluated:Patient Re-evaluated prior to induction Oxygen Delivery Method: Nasal cannula Placement Confirmation: positive ETCO2

## 2021-01-26 NOTE — H&P (Signed)
[facility]   Primary Care Physician:  Leonel Ramsay, MD Ophthalmologist: Dr. George Ina  Pre-Procedure History & Physical: HPI:  Melvin Brewer is a 70 y.o. male here for cataract surgery.   Past Medical History:  Diagnosis Date   AAA (abdominal aortic aneurysm)    Abdominal aortic aneurysm (AAA)    Allergic rhinitis    Anginal pain (HCC)    Centrilobular emphysema (HCC)    Cervical spine degeneration    CHF (congestive heart failure) (HCC)    COPD (chronic obstructive pulmonary disease) ([place])    Coronary artery disease    Diabetes mellitus without complication (HCC)    Diverticulitis    DJD (degenerative joint disease)    Early cataract    Eczema    GERD (gastroesophageal reflux disease)    H/O hiatal hernia    AGE 63 S  NO MEDS   Hemorrhoids    Herpes simplex infection    Hypercholesterolemia    Hyperglycemia    Hyperlipidemia    Hypertension    Macular degeneration    right eye   Obesity    Shortness of breath    W/ EXERTION    Wears glasses    Wears partial dentures     Past Surgical History:  Procedure Laterality Date   ABDOMINAL AORTIC ENDOVASCULAR STENT GRAFT N/A 05/03/2019   Procedure: ABDOMINAL AORTIC ENDOVASCULAR STENT GRAFT;  Surgeon: Serafina Mitchell, MD;  Location: [place];  Service: Vascular;  Laterality: N/A;   ANTERIOR CERVICAL DECOMP/DISCECTOMY FUSION N/A 06/20/2013   Procedure: CERVCIAL FIVE-SIX,CERVICAL SIX-SEVEN ANTERIOR CERVICAL DECOMPRESSION WITH FUSION INTERBODY PROSTHESIS PLATING AND PEEK CAGE;  Surgeon: Ophelia Charter, MD;  [facility]: [facility];  Service: Neurosurgery;  Laterality: N/A;  anterior   CARDIAC CATHETERIZATION       2007    CHEST TUBE INSERTION Left 10/15/2018   Procedure: INSERTION OF LEFT CHEST TUBE;  Surgeon: Ivin Poot, MD;  Location: [place];  Service: Thoracic;  Laterality: Left;   COLONOSCOPY WITH PROPOFOL N/A 12/26/2017   Procedure: COLONOSCOPY WITH PROPOFOL;  Surgeon: Lollie Sails, MD;   Location: [facility] ENDOSCOPY;  Service: Endoscopy;  Laterality: N/A;   CORONARY ARTERY BYPASS GRAFT     2007  $Rem'@CONE'LbYU$    CORONARY ARTERY BYPASS GRAFT N/A 10/04/2018   Procedure: REDO CORONARY ARTERY BYPASS GRAFTING (CABG) x 1, SVG TO LAD, USING LEFT GREATER SAPHENOUS VEIN HARVESTED ENDOSCOPICALLY;  Surgeon: Ivin Poot, MD;  Location: [place];  Service: Open Heart Surgery;  Laterality: N/A;   EYE SURGERY     RT EYE LASER (RET DETACHMENT)   RIGHT/LEFT HEART CATH AND CORONARY/GRAFT ANGIOGRAPHY Left 09/05/2018   Procedure: RIGHT/LEFT HEART CATH AND CORONARY/GRAFT ANGIOGRAPHY;  Surgeon: Yolonda Kida, MD;  Location: [place] CV LAB;  Service: Cardiovascular;  Laterality: Left;   TEE WITHOUT CARDIOVERSION N/A 10/04/2018   Procedure: TRANSESOPHAGEAL ECHOCARDIOGRAM (TEE);  Surgeon: Prescott Gum, Collier Salina, MD;  Location: [place];  Service: Open Heart Surgery;  Laterality: N/A;   TONSILLECTOMY     ULTRASOUND GUIDANCE FOR VASCULAR ACCESS Bilateral 05/03/2019   Procedure: Ultrasound Guidance For Vascular Access, bilateral femoral arteries;  Surgeon: Serafina Mitchell, MD;  [facility];  Service: Vascular;  Laterality: Bilateral;   VIDEO BRONCHOSCOPY WITH INSERTION OF INTERBRONCHIAL VALVE (IBV) N/A 10/26/2018   Procedure: VIDEO BRONCHOSCOPY WITH INSERTION OF INTERBRONCHIAL VALVES (IBV);  Surgeon: Prescott Gum, Collier Salina, MD;  Location: [place];  Service: Thoracic;  Laterality: N/A;   [place]  OF INTERBRONCHIAL VALVE (IBV) N/A 12/31/2018   Procedure: VIDEO BRONCHOSCOPY WITH REMOVAL OF INTERBRONCHIAL VALVE (IBV);  Surgeon: Prescott Gum, Collier Salina, MD;  Location: [place];  Service: Thoracic;  Laterality: N/A;    Prior to Admission medications   Medication Sig Start Date End Date Taking? Authorizing Provider  albuterol (PROVENTIL HFA;VENTOLIN HFA) 108 (90 BASE) MCG/ACT inhaler Inhale 2 puffs into the lungs every 6 (six) hours as needed for wheezing or shortness of breath.   Yes [provider]   albuterol (PROVENTIL) (2.5 MG/3ML) 0.083% nebulizer solution Inhale 2.5 mg into the lungs every 6 (six) hours as needed for wheezing or shortness of breath. 04/27/18  Yes [provider]  aspirin EC 81 MG tablet Take 81 mg by mouth every evening.    Yes [provider]  azelastine (ASTELIN) 0.1 % nasal spray Place 1 spray into both nostrils daily as needed for allergies. Use in each nostril as directed    Yes [provider]  budesonide-formoterol (SYMBICORT) 160-4.5 MCG/ACT inhaler Inhale 2 puffs into the lungs 2 (two) times daily.   Yes [provider]  Cholecalciferol (VITAMIN D3) 50 MCG (2000 UT) TABS Take 2,000 Units by mouth daily.   Yes [provider]  fexofenadine (ALLEGRA) 180 MG tablet Take 180 mg by mouth daily.   Yes [provider]  furosemide (LASIX) 20 MG tablet Take 20 mg by mouth as needed.   Yes [provider]  losartan (COZAAR) 25 MG tablet Take 50 mg by mouth daily. 07/08/18  Yes [provider]  metFORMIN (GLUCOPHAGE) 500 MG tablet Take 500 mg by mouth in the morning and at bedtime.   Yes [provider]  metoprolol tartrate (LOPRESSOR) 25 MG tablet Take 1 tablet (25 mg total) by mouth 2 (two) times daily. 11/01/18  Yes Barrett, Erin R, PA-C  montelukast (SINGULAIR) 10 MG tablet Take 10 mg by mouth every evening. 07/02/18  Yes [provider]  Roflumilast (DALIRESP) 250 MCG TABS Take 250 mcg by mouth every evening.   Yes [provider]  rosuvastatin (CRESTOR) 40 MG tablet Take 40 mg by mouth daily.   Yes [provider]  Tiotropium Bromide Monohydrate 2.5 MCG/ACT AERS Inhale 2 puffs into the lungs daily.  03/06/17  Yes [provider]  valACYclovir (VALTREX) 500 MG tablet Take 500 mg by mouth every evening. 06/13/18  Yes [provider]    Allergies as of 12/28/2020 - Review Complete 12/21/2020  Allergen Reaction Noted   Atorvastatin Other (See Comments)  12/25/2017   Sulfa antibiotics Rash 06/12/2013    History reviewed. No pertinent family history.  Social History   Socioeconomic History   Marital status: Married    Spouse name: Not on file   Number of children: Not on file   Years of education: Not on file   Highest education level: Not on file  Occupational History   Not on file  Tobacco Use   Smoking status: Former    Packs/day: 2.00    Years: 43.00    Pack years: 86.00    Types: Cigarettes    Quit date: 04/11/2004    Years since quitting: 16.8   Smokeless tobacco: Never  Vaping Use   Vaping Use: Never used  Substance and Sexual Activity   Alcohol use: Yes    Alcohol/week: 7.0 standard drinks    Types: 7 Standard drinks or equivalent per week   Drug use: No   Sexual activity: Not Currently  Other Topics  Concern   Not on file  Social History Narrative   Not on file   Social Determinants of Health   Financial Resource Strain: Not on file  Food Insecurity: Not on file  Transportation Needs: Not on file  Physical Activity: Not on file  Stress: Not on file  Social Connections: Not on file  Intimate Partner Violence: Not on file    Review of Systems: See HPI, otherwise negative ROS  Physical Exam: BP (!) 180/71   Pulse 61   Temp 97.7 F (36.5 C) (Temporal)   Resp 18   Ht 5' 9"  (1.753 m)   Wt 89.4 kg   SpO2 95%   BMI 29.09 kg/m  General:   Alert, cooperative in NAD Head:  Normocephalic and atraumatic. Respiratory:  Normal work of breathing. Cardiovascular:  RRR  Impression/Plan: Melvin Brewer is here for cataract surgery.  Risks, benefits, limitations, and alternatives regarding cataract surgery have been reviewed with the patient.  Questions have been answered.  All parties agreeable.   Birder Robson, MD  01/26/2021, 7:50 AM

## 2021-01-27 ENCOUNTER — Other Ambulatory Visit: Payer: Self-pay

## 2021-01-27 ENCOUNTER — Encounter: Payer: Self-pay | Admitting: Ophthalmology

## 2021-02-04 NOTE — Discharge Instructions (Signed)

## 2021-02-09 ENCOUNTER — Ambulatory Visit: Payer: Medicare Other | Admitting: Anesthesiology

## 2021-02-09 ENCOUNTER — Other Ambulatory Visit: Payer: Self-pay

## 2021-02-09 ENCOUNTER — Encounter
Admission: RE | Disposition: A | Discharge status: Home / Self Care | Payer: Self-pay | Source: Home / Self Care | Attending: Ophthalmology

## 2021-02-09 ENCOUNTER — Ambulatory Visit
Admission: RE | Admit: 2021-02-09 | Discharge: 2021-02-09 | Disposition: A | Discharge status: Home / Self Care | Payer: Medicare Other | Attending: Ophthalmology | Admitting: Ophthalmology

## 2021-02-09 ENCOUNTER — Encounter: Payer: Self-pay | Admitting: Ophthalmology

## 2021-02-09 DIAGNOSIS — E669 Obesity, unspecified: Secondary | ICD-10-CM | POA: Diagnosis not present

## 2021-02-09 DIAGNOSIS — H2512 Age-related nuclear cataract, left eye: Secondary | ICD-10-CM | POA: Diagnosis present

## 2021-02-09 DIAGNOSIS — Z87891 Personal history of nicotine dependence: Secondary | ICD-10-CM | POA: Diagnosis not present

## 2021-02-09 DIAGNOSIS — Z6829 Body mass index (BMI) 29.0-29.9, adult: Secondary | ICD-10-CM | POA: Insufficient documentation

## 2021-02-09 HISTORY — PX: CATARACT EXTRACTION W/PHACO: SHX586

## 2021-02-09 LAB — GLUCOSE, CAPILLARY
Glucose-Capillary: 84 mg/dL (ref 70–99)
Glucose-Capillary: 102 mg/dL — ABNORMAL HIGH (ref 70–99)

## 2021-02-09 SURGERY — PHACOEMULSIFICATION, CATARACT, WITH IOL INSERTION
Anesthesia: Monitor Anesthesia Care | Site: Eye | Laterality: Left

## 2021-02-09 MED ORDER — BRIMONIDINE TARTRATE-TIMOLOL 0.2-0.5 % OP SOLN
OPHTHALMIC | Status: DC | PRN
  Administered 2021-02-09: 1 [drp] via OPHTHALMIC

## 2021-02-09 MED ORDER — SIGHTPATH DOSE#1 NA CHONDROIT SULF-NA HYALURON 40-17 MG/ML IO SOLN
INTRAOCULAR | Status: DC | PRN
  Administered 2021-02-09: 1 mL via INTRAOCULAR

## 2021-02-09 MED ORDER — TETRACAINE HCL 0.5 % OP SOLN
1.0000 [drp] | OPHTHALMIC | Status: DC | PRN
  Administered 2021-02-09 (×3): 1 [drp] via OPHTHALMIC

## 2021-02-09 MED ORDER — MOXIFLOXACIN HCL 0.5 % OP SOLN
OPHTHALMIC | Status: DC | PRN
  Administered 2021-02-09: 0.2 mL via OPHTHALMIC

## 2021-02-09 MED ORDER — LACTATED RINGERS IV SOLN: INTRAVENOUS | Status: DC

## 2021-02-09 MED ORDER — SIGHTPATH DOSE#1 BSS IO SOLN
INTRAOCULAR | Status: DC | PRN
  Administered 2021-02-09: 54 mL via OPHTHALMIC

## 2021-02-09 MED ORDER — SIGHTPATH DOSE#1 BSS IO SOLN
INTRAOCULAR | Status: DC | PRN
  Administered 2021-02-09: 2 mL

## 2021-02-09 MED ORDER — OXYCODONE HCL 5 MG PO TABS: 5.0000 mg | ORAL_TABLET | Freq: Once | ORAL | Status: DC | PRN

## 2021-02-09 MED ORDER — MIDAZOLAM HCL 2 MG/2ML IJ SOLN
INTRAMUSCULAR | Status: DC | PRN
  Administered 2021-02-09: 2 mg via INTRAVENOUS

## 2021-02-09 MED ORDER — SIGHTPATH DOSE#1 BSS IO SOLN
INTRAOCULAR | Status: DC | PRN
  Administered 2021-02-09: 15 mL via INTRAOCULAR

## 2021-02-09 MED ORDER — FENTANYL CITRATE (PF) 100 MCG/2ML IJ SOLN
INTRAMUSCULAR | Status: DC | PRN
  Administered 2021-02-09: 100 ug via INTRAVENOUS

## 2021-02-09 MED ORDER — ARMC OPHTHALMIC DILATING DROPS
1.0000 "application " | OPHTHALMIC | Status: DC | PRN
  Administered 2021-02-09 (×3): 1 via OPHTHALMIC

## 2021-02-09 MED ORDER — OXYCODONE HCL 5 MG/5ML PO SOLN: 5.0000 mg | Freq: Once | ORAL | Status: DC | PRN

## 2021-02-09 SURGICAL SUPPLY — 15 items
CANNULA ANT/CHMB 27G (MISCELLANEOUS) IMPLANT
CANNULA ANT/CHMB 27GA (MISCELLANEOUS) IMPLANT
GLOVE SURG ENC TEXT LTX SZ8 (GLOVE) ×2 IMPLANT
GLOVE SURG TRIUMPH 8.0 PF LTX (GLOVE) ×2 IMPLANT
GOWN STRL REUS W/ TWL LRG LVL3 (GOWN DISPOSABLE) ×2 IMPLANT
GOWN STRL REUS W/TWL LRG LVL3 (GOWN DISPOSABLE) ×4
LENS IOL TECNIS EYHANCE 20.5 (Intraocular Lens) ×1 IMPLANT
NDL FILTER BLUNT 18X1 1/2 (NEEDLE) ×1 IMPLANT
NEEDLE FILTER BLUNT 18X 1/2SAF (NEEDLE) ×1
NEEDLE FILTER BLUNT 18X1 1/2 (NEEDLE) ×1 IMPLANT
PACK EYE AFTER SURG (MISCELLANEOUS) IMPLANT
SYR 3ML LL SCALE MARK (SYRINGE) ×2 IMPLANT
SYR TB 1ML LUER SLIP (SYRINGE) ×2 IMPLANT
WATER STERILE IRR 250ML POUR (IV SOLUTION) ×2 IMPLANT
WIPE NON LINTING 3.25X3.25 (MISCELLANEOUS) IMPLANT

## 2021-02-09 NOTE — H&P (Signed)
[facility]   Primary Care Physician:  Leonel Ramsay, MD Ophthalmologist: Dr. George Ina  Pre-Procedure History & Physical: HPI:  Melvin Brewer is a 70 y.o. male here for cataract surgery.   Past Medical History:  Diagnosis Date   AAA (abdominal aortic aneurysm)    Abdominal aortic aneurysm (AAA)    Allergic rhinitis    Anginal pain (HCC)    Centrilobular emphysema (HCC)    Cervical spine degeneration    CHF (congestive heart failure) (HCC)    COPD (chronic obstructive pulmonary disease) ([place])    Coronary artery disease    Diabetes mellitus without complication (HCC)    Diverticulitis    DJD (degenerative joint disease)    Early cataract    Eczema    GERD (gastroesophageal reflux disease)    H/O hiatal hernia    AGE 51 S  NO MEDS   Hemorrhoids    Herpes simplex infection    Hypercholesterolemia    Hyperglycemia    Hyperlipidemia    Hypertension    Macular degeneration    right eye   Obesity    Shortness of breath    W/ EXERTION    Wears glasses    Wears partial dentures     Past Surgical History:  Procedure Laterality Date   ABDOMINAL AORTIC ENDOVASCULAR STENT GRAFT N/A 05/03/2019   Procedure: ABDOMINAL AORTIC ENDOVASCULAR STENT GRAFT;  Surgeon: Serafina Mitchell, MD;  Location: [place];  Service: Vascular;  Laterality: N/A;   ANTERIOR CERVICAL DECOMP/DISCECTOMY FUSION N/A 06/20/2013   Procedure: CERVCIAL FIVE-SIX,CERVICAL SIX-SEVEN ANTERIOR CERVICAL DECOMPRESSION WITH FUSION INTERBODY PROSTHESIS PLATING AND PEEK CAGE;  Surgeon: Ophelia Charter, MD;  [facility]: [facility];  Service: Neurosurgery;  Laterality: N/A;  anterior   CARDIAC CATHETERIZATION     [place]  2007    CATARACT EXTRACTION W/PHACO Right 01/26/2021   Procedure: CATARACT EXTRACTION PHACO AND INTRAOCULAR LENS PLACEMENT (IOC) RIGHT DIABETIC 12.81 01:24.4;  Surgeon: Birder Robson, MD;  Location: [place];  Service: Ophthalmology;  Laterality: Right;  Diabetic   CHEST TUBE  INSERTION Left 10/15/2018   Procedure: INSERTION OF LEFT CHEST TUBE;  Surgeon: Ivin Poot, MD;  Location: [place];  Service: Thoracic;  Laterality: Left;   COLONOSCOPY WITH PROPOFOL N/A 12/26/2017   Procedure: COLONOSCOPY WITH PROPOFOL;  Surgeon: Lollie Sails, MD;  Location: [facility] ENDOSCOPY;  Service: Endoscopy;  Laterality: N/A;   CORONARY ARTERY BYPASS GRAFT     2007  $Rem'@CONE'WEcH$    CORONARY ARTERY BYPASS GRAFT N/A 10/04/2018   Procedure: REDO CORONARY ARTERY BYPASS GRAFTING (CABG) x 1, SVG TO LAD, USING LEFT GREATER SAPHENOUS VEIN HARVESTED ENDOSCOPICALLY;  Surgeon: Ivin Poot, MD;  Location: [place];  Service: Open Heart Surgery;  Laterality: N/A;   EYE SURGERY     RT EYE LASER (RET DETACHMENT)   RIGHT/LEFT HEART CATH AND CORONARY/GRAFT ANGIOGRAPHY Left 09/05/2018   Procedure: RIGHT/LEFT HEART CATH AND CORONARY/GRAFT ANGIOGRAPHY;  Surgeon: Yolonda Kida, MD;  Location: [place] CV LAB;  Service: Cardiovascular;  Laterality: Left;   TEE WITHOUT CARDIOVERSION N/A 10/04/2018   Procedure: TRANSESOPHAGEAL ECHOCARDIOGRAM (TEE);  Surgeon: Prescott Gum, Collier Salina, MD;  Location: [place];  Service: Open Heart Surgery;  Laterality: N/A;   TONSILLECTOMY     ULTRASOUND GUIDANCE FOR VASCULAR ACCESS Bilateral 05/03/2019   Procedure: Ultrasound Guidance For Vascular Access, bilateral femoral arteries;  Surgeon: Serafina Mitchell, MD;  Location: [facility] OR;  Service: Vascular;  Laterality: Bilateral;   VIDEO BRONCHOSCOPY WITH  INSERTION OF INTERBRONCHIAL VALVE (IBV) N/A 10/26/2018   Procedure: VIDEO BRONCHOSCOPY WITH INSERTION OF INTERBRONCHIAL VALVES (IBV);  Surgeon: Prescott Gum, Collier Salina, MD;  Location: [place];  Service: Thoracic;  Laterality: N/A;   VIDEO BRONCHOSCOPY WITH INSERTION OF INTERBRONCHIAL VALVE (IBV) N/A 12/31/2018   Procedure: VIDEO BRONCHOSCOPY WITH REMOVAL OF INTERBRONCHIAL VALVE (IBV);  Surgeon: Prescott Gum, Collier Salina, MD;  Location: [place];  Service: Thoracic;  Laterality: N/A;    Prior to Admission  medications   Medication Sig Start Date End Date Taking? Authorizing Provider  albuterol (PROVENTIL HFA;VENTOLIN HFA) 108 (90 BASE) MCG/ACT inhaler Inhale 2 puffs into the lungs every 6 (six) hours as needed for wheezing or shortness of breath.   Yes [provider]  albuterol (PROVENTIL) (2.5 MG/3ML) 0.083% nebulizer solution Inhale 2.5 mg into the lungs every 6 (six) hours as needed for wheezing or shortness of breath. 04/27/18  Yes [provider]  aspirin EC 81 MG tablet Take 81 mg by mouth every evening.    Yes [provider]  azelastine (ASTELIN) 0.1 % nasal spray Place 1 spray into both nostrils daily as needed for allergies. Use in each nostril as directed    Yes [provider]  budesonide-formoterol (SYMBICORT) 160-4.5 MCG/ACT inhaler Inhale 2 puffs into the lungs 2 (two) times daily.   Yes [provider]  Cholecalciferol (VITAMIN D3) 50 MCG (2000 UT) TABS Take 2,000 Units by mouth daily.   Yes [provider]  fexofenadine (ALLEGRA) 180 MG tablet Take 180 mg by mouth daily.   Yes [provider]  furosemide (LASIX) 20 MG tablet Take 20 mg by mouth as needed.   Yes [provider]  losartan (COZAAR) 25 MG tablet Take 50 mg by mouth daily. 07/08/18  Yes [provider]  metFORMIN (GLUCOPHAGE) 500 MG tablet Take 500 mg by mouth in the morning and at bedtime.   Yes [provider]  metoprolol tartrate (LOPRESSOR) 25 MG tablet Take 1 tablet (25 mg total) by mouth 2 (two) times daily. 11/01/18  Yes Barrett, Erin R, PA-C  montelukast (SINGULAIR) 10 MG tablet Take 10 mg by mouth every evening. 07/02/18  Yes [provider]  Roflumilast (DALIRESP) 250 MCG TABS Take 250 mcg by mouth every evening.   Yes [provider]  rosuvastatin (CRESTOR) 40 MG tablet Take 40 mg by mouth daily.   Yes [provider]  Tiotropium Bromide Monohydrate 2.5 MCG/ACT AERS Inhale 2 puffs into the lungs  daily.  03/06/17  Yes [provider]  valACYclovir (VALTREX) 500 MG tablet Take 500 mg by mouth every evening. 06/13/18  Yes [provider]    Allergies as of 12/28/2020 - Review Complete 12/21/2020  Allergen Reaction Noted   Atorvastatin Other (See Comments) 12/25/2017   Sulfa antibiotics Rash 06/12/2013    History reviewed. No pertinent family history.  Social History   Socioeconomic History   Marital status: Married    Spouse name: Not on file   Number of children: Not on file   Years of education: Not on file   Highest education level: Not on file  Occupational History   Not on file  Tobacco Use   Smoking status: Former    Packs/day: 2.00    Years: 43.00    Pack years: 86.00    Types: Cigarettes    Quit date: 04/11/2004    Years since quitting: 16.8   Smokeless tobacco: Never  Vaping Use   Vaping Use: Never used  Substance  and Sexual Activity   Alcohol use: Yes    Alcohol/week: 7.0 standard drinks    Types: 7 Standard drinks or equivalent per week   Drug use: No   Sexual activity: Not Currently  Other Topics Concern   Not on file  Social History Narrative   Not on file   Social Determinants of Health   Financial Resource Strain: Not on file  Food Insecurity: Not on file  Transportation Needs: Not on file  Physical Activity: Not on file  Stress: Not on file  Social Connections: Not on file  Intimate Partner Violence: Not on file    Review of Systems: See HPI, otherwise negative ROS  Physical Exam: BP (!) 171/71   Pulse (!) 56   Temp 97.6 F (36.4 C) (Temporal)   Wt 88.5 kg   SpO2 98%   BMI 28.80 kg/m  General:   Alert, cooperative in NAD Head:  Normocephalic and atraumatic. Respiratory:  Normal work of breathing. Cardiovascular:  RRR  Impression/Plan: Melvin Brewer is here for cataract surgery.  Risks, benefits, limitations, and alternatives regarding cataract surgery have been reviewed with the patient.  Questions  have been answered.  All parties agreeable.   Birder Robson, MD  02/09/2021, 7:53 AM

## 2021-02-09 NOTE — Op Note (Signed)
PREOPERATIVE DIAGNOSIS:  Nuclear sclerotic cataract of the left eye.   POSTOPERATIVE DIAGNOSIS:  Nuclear sclerotic cataract of the left eye.   OPERATIVE PROCEDURE:ORPROCALL@   SURGEON:  Birder Robson, MD.   ANESTHESIA:  Anesthesiologist: Fidel Levy, MD CRNA: Silvana Newness, CRNA  1.      Managed anesthesia care. 2.     0.41ml of Shugarcaine was instilled following the paracentesis   COMPLICATIONS:  None.   TECHNIQUE:   Stop and chop   DESCRIPTION OF PROCEDURE:  The patient was examined and consented in the preoperative holding area where the aforementioned topical anesthesia was applied to the left eye and then brought back to the Operating Room where the left eye was prepped and draped in the usual sterile ophthalmic fashion and a lid speculum was placed. A paracentesis was created with the side port blade and the anterior chamber was filled with viscoelastic. A near clear corneal incision was performed with the steel keratome. A continuous curvilinear capsulorrhexis was performed with a cystotome followed by the capsulorrhexis forceps. Hydrodissection and hydrodelineation were carried out with BSS on a blunt cannula. The lens was removed in a stop and chop  technique and the remaining cortical material was removed with the irrigation-aspiration handpiece. The capsular bag was inflated with viscoelastic and the Technis [id number] lens was placed in the capsular bag without complication. The remaining viscoelastic was removed from the eye with the irrigation-aspiration handpiece. The wounds were hydrated. The anterior chamber was flushed with BSS and the eye was inflated to physiologic pressure. 0.58ml Vigamox was placed in the anterior chamber. The wounds were found to be water tight. The eye was dressed with Combigan. The patient was given protective glasses to wear throughout the day and a shield with which to sleep tonight. The patient was also given drops with which to begin a drop regimen  today and will follow-up with me in one day. Implant Name Type Inv. Item Serial No. Manufacturer Lot No. LRB No. Used Action  LENS IOL TECNIS EYHANCE 20.5 - [id number] Intraocular Lens LENS IOL TECNIS EYHANCE 20.5 [id number] JOHNSON   Left 1 Implanted    Procedure(s) with comments: CATARACT EXTRACTION PHACO AND INTRAOCULAR LENS PLACEMENT (IOC) LEFT DIABETIC 6.79 00:41.5 (Left) - Diabetic  Electronically signed: Birder Robson 02/09/2021 8:14 AM

## 2021-02-09 NOTE — Anesthesia Preprocedure Evaluation (Signed)
Anesthesia Evaluation  Patient identified by MRN, date of birth, ID band Patient awake    Reviewed: NPO status   Airway Mallampati: II  TM Distance: >3 FB Neck ROM: full    Dental  (+) Upper Dentures, Lower Dentures, Missing   Pulmonary COPD (COPD - on symbicort, spriva and albuterol. ),  COPD inhaler, former smoker,    Pulmonary exam normal breath sounds clear to auscultation       Cardiovascular Exercise Tolerance: Good hypertension, + CAD, + CABG (x2 2007 and 2020;) and + Peripheral Vascular Disease (AAA - had an aortic stent by Dr Arita Miss.)  Normal cardiovascular exam Rhythm:Regular     Neuro/Psych negative neurological ROS  negative psych ROS   GI/Hepatic Neg liver ROS, GERD  Controlled,  Endo/Other  diabetesMorbid obesity (bmi 29)  Renal/GU CRF  negative genitourinary   Musculoskeletal  (+) Arthritis ,   Abdominal   Peds  Hematology negative hematology ROS (+)   Anesthesia Other Findings med note: 01/2021: [facility]zgerald;  had ECCE 2 weeks ago;  Reproductive/Obstetrics                             Anesthesia Physical Anesthesia Plan  ASA: 3  Anesthesia Plan: MAC   Post-op Pain Management:    Induction:   PONV Risk Score and Plan: 1 and Midazolam and TIVA  Airway Management Planned:   Additional Equipment:   Intra-op Plan:   Post-operative Plan:   Informed Consent: I have reviewed the patients History and Physical, chart, labs and discussed the procedure including the risks, benefits and alternatives for the proposed anesthesia with the patient or authorized representative who has indicated his/her understanding and acceptance.       Plan Discussed with: CRNA  Anesthesia Plan Comments:         Anesthesia Quick Evaluation

## 2021-02-09 NOTE — Transfer of Care (Signed)
Immediate Anesthesia Transfer of Care Note  Patient: Melvin Brewer  Procedure(s) Performed: CATARACT EXTRACTION PHACO AND INTRAOCULAR LENS PLACEMENT (IOC) LEFT DIABETIC 6.79 00:41.5 (Left: Eye)  Patient Location: PACU  Anesthesia Type: MAC  Level of Consciousness: awake, alert  and patient cooperative  Airway and Oxygen Therapy: Patient Spontanous Breathing and Patient connected to supplemental oxygen  Post-op Assessment: Post-op Vital signs reviewed, Patient's Cardiovascular Status Stable, Respiratory Function Stable, Patent Airway and No signs of Nausea or vomiting  Post-op Vital Signs: Reviewed and stable  Complications: No notable events documented.

## 2021-02-09 NOTE — Anesthesia Postprocedure Evaluation (Signed)
Anesthesia Post Note  Patient: Melvin Brewer  Procedure(s) Performed: CATARACT EXTRACTION PHACO AND INTRAOCULAR LENS PLACEMENT (IOC) LEFT DIABETIC 6.79 00:41.5 (Left: Eye)     Patient location during evaluation: PACU Anesthesia Type: MAC Level of consciousness: awake and alert Pain management: pain level controlled Vital Signs Assessment: post-procedure vital signs reviewed and stable Respiratory status: spontaneous breathing, nonlabored ventilation, respiratory function stable and patient connected to nasal cannula oxygen Cardiovascular status: stable and blood pressure returned to baseline Postop Assessment: no apparent nausea or vomiting Anesthetic complications: no   No notable events documented.  Fidel Levy

## 2021-02-10 ENCOUNTER — Encounter: Payer: Self-pay | Admitting: Ophthalmology

## 2021-02-24 ENCOUNTER — Emergency Department: Payer: Medicare Other

## 2021-02-24 ENCOUNTER — Emergency Department
Admission: EM | Admit: 2021-02-24 | Discharge: 2021-02-24 | Disposition: A | Discharge status: Home / Self Care | Payer: Medicare Other | Attending: Emergency Medicine | Admitting: Emergency Medicine

## 2021-02-24 ENCOUNTER — Other Ambulatory Visit: Payer: Self-pay

## 2021-02-24 DIAGNOSIS — Z7982 Long term (current) use of aspirin: Secondary | ICD-10-CM | POA: Diagnosis not present

## 2021-02-24 DIAGNOSIS — J449 Chronic obstructive pulmonary disease, unspecified: Secondary | ICD-10-CM | POA: Insufficient documentation

## 2021-02-24 DIAGNOSIS — Z87891 Personal history of nicotine dependence: Secondary | ICD-10-CM | POA: Insufficient documentation

## 2021-02-24 DIAGNOSIS — Z7984 Long term (current) use of oral hypoglycemic drugs: Secondary | ICD-10-CM | POA: Diagnosis not present

## 2021-02-24 DIAGNOSIS — I119 Hypertensive heart disease without heart failure: Secondary | ICD-10-CM | POA: Insufficient documentation

## 2021-02-24 DIAGNOSIS — N200 Calculus of kidney: Secondary | ICD-10-CM

## 2021-02-24 DIAGNOSIS — E119 Type 2 diabetes mellitus without complications: Secondary | ICD-10-CM | POA: Diagnosis not present

## 2021-02-24 DIAGNOSIS — I2510 Atherosclerotic heart disease of native coronary artery without angina pectoris: Secondary | ICD-10-CM | POA: Insufficient documentation

## 2021-02-24 DIAGNOSIS — R1030 Lower abdominal pain, unspecified: Secondary | ICD-10-CM | POA: Diagnosis present

## 2021-02-24 DIAGNOSIS — R10A0 Flank pain, unspecified side: Secondary | ICD-10-CM

## 2021-02-24 DIAGNOSIS — Z7951 Long term (current) use of inhaled steroids: Secondary | ICD-10-CM | POA: Insufficient documentation

## 2021-02-24 DIAGNOSIS — R109 Unspecified abdominal pain: Secondary | ICD-10-CM

## 2021-02-24 DIAGNOSIS — Z951 Presence of aortocoronary bypass graft: Secondary | ICD-10-CM | POA: Diagnosis not present

## 2021-02-24 LAB — URINALYSIS, ROUTINE W REFLEX MICROSCOPIC
Bacteria, UA: NONE SEEN
Bilirubin Urine: NEGATIVE
Glucose, UA: 150 mg/dL — AB
Ketones, ur: NEGATIVE mg/dL
Leukocytes,Ua: NEGATIVE
Nitrite: NEGATIVE
Protein, ur: 100 mg/dL — AB
RBC / HPF: >50 RBC/hpf — ABNORMAL HIGH (ref 0–5)
Specific Gravity, Urine: 1.023 (ref 1.005–1.030)
Squamous Epithelial / HPF: NONE SEEN (ref 0–5)
pH: 5 (ref 5.0–8.0)

## 2021-02-24 LAB — BASIC METABOLIC PANEL
Anion gap: 9 (ref 5–15)
BUN: 21 mg/dL (ref 8–23)
CO2: 27 mmol/L (ref 22–32)
Calcium: 9.1 mg/dL (ref 8.9–10.3)
Chloride: 106 mmol/L (ref 98–111)
Creatinine, Ser: 1.18 mg/dL (ref 0.61–1.24)
GFR, Estimated: >60 mL/min (ref >60)
Glucose, Bld: 118 mg/dL — ABNORMAL HIGH (ref 70–99)
Potassium: 4.3 mmol/L (ref 3.5–5.1)
Sodium: 142 mmol/L (ref 135–145)

## 2021-02-24 LAB — CBC
HCT: 44.4 % (ref 39.0–52.0)
Hemoglobin: 14.6 g/dL (ref 13.0–17.0)
MCH: 32.6 pg (ref 26.0–34.0)
MCHC: 32.9 g/dL (ref 30.0–36.0)
MCV: 99.1 fL (ref 80.0–100.0)
Platelets: 198 10*3/uL (ref 150–400)
RBC: 4.48 MIL/uL (ref 4.22–5.81)
RDW: 12.6 % (ref 11.5–15.5)
WBC: 8.4 10*3/uL (ref 4.0–10.5)
nRBC: 0 % (ref 0.0–0.2)

## 2021-02-24 MED ORDER — SODIUM CHLORIDE 0.9 % IV SOLN
2.0000 g | Freq: Once | INTRAVENOUS | Status: AC
  Administered 2021-02-24: 2 g via INTRAVENOUS
  Filled 2021-02-24: qty 20

## 2021-02-24 MED ORDER — ONDANSETRON HCL 4 MG/2ML IJ SOLN
4.0000 mg | Freq: Once | INTRAMUSCULAR | Status: AC
  Administered 2021-02-24: 4 mg via INTRAVENOUS
  Filled 2021-02-24: qty 2

## 2021-02-24 MED ORDER — SODIUM CHLORIDE 0.9 % IV BOLUS
500.0000 mL | Freq: Once | INTRAVENOUS | Status: AC
  Administered 2021-02-24: 500 mL via INTRAVENOUS

## 2021-02-24 MED ORDER — MORPHINE SULFATE (PF) 4 MG/ML IV SOLN
4.0000 mg | Freq: Once | INTRAVENOUS | Status: AC
  Administered 2021-02-24: 4 mg via INTRAVENOUS
  Filled 2021-02-24: qty 1

## 2021-02-24 MED ORDER — MORPHINE SULFATE (PF) 4 MG/ML IV SOLN
6.0000 mg | Freq: Once | INTRAVENOUS | Status: AC
  Administered 2021-02-24: 6 mg via INTRAVENOUS
  Filled 2021-02-24: qty 2

## 2021-02-24 MED ORDER — CEFDINIR 300 MG PO CAPS: 300.0000 mg | ORAL_CAPSULE | Freq: Two times a day (BID) | ORAL | 0 refills | Status: DC

## 2021-02-24 MED ORDER — ONDANSETRON 4 MG PO TBDP: 4.0000 mg | ORAL_TABLET | Freq: Three times a day (TID) | ORAL | 0 refills | Status: DC | PRN

## 2021-02-24 MED ORDER — KETOROLAC TROMETHAMINE 30 MG/ML IJ SOLN
15.0000 mg | Freq: Once | INTRAMUSCULAR | Status: AC
  Administered 2021-02-24: 15 mg via INTRAVENOUS
  Filled 2021-02-24: qty 1

## 2021-02-24 MED ORDER — OXYCODONE-ACETAMINOPHEN 5-325 MG PO TABS: 1.0000 | ORAL_TABLET | Freq: Four times a day (QID) | ORAL | 0 refills | Status: DC | PRN

## 2021-02-24 MED ORDER — TAMSULOSIN HCL 0.4 MG PO CAPS: 0.4000 mg | ORAL_CAPSULE | Freq: Every day | ORAL | 0 refills | Status: AC

## 2021-02-24 NOTE — ED Provider Notes (Signed)
[facility] Emergency Department Provider Note  ____________________________________________   Event Date/Time   First MD Initiated Contact with Patient 02/24/21 2019     (approximate)  I have reviewed the triage vital signs and the nursing notes.   HISTORY  Chief Complaint Flank Pain    HPI Melvin Brewer is a 70 y.o. male  with PMHx CHF, COPD, DM, HTN, HLD, CAD s/p CABG, AAA s/p repair, here with flank pain. 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 Over the past 12 hr or so, however, he has had a new and worsening, severe, aching pain on his R flank radiating down toward his R groin. He's had associated dark urine and some frequency. Mild nausea but no vomiting. No h/o kidney stones. Pain is different in location and distribution to his back pain. Pain radiates to testicle but denies any testicular swelling or asymmetry.        Past Medical History:  Diagnosis Date   AAA (abdominal aortic aneurysm)    Abdominal aortic aneurysm (AAA)    Allergic rhinitis    Anginal pain (HCC)    Centrilobular emphysema (HCC)    Cervical spine degeneration    CHF (congestive heart failure) (HCC)    COPD (chronic obstructive pulmonary disease) ([place])    Coronary artery disease    Diabetes mellitus without complication (HCC)    Diverticulitis    DJD (degenerative joint disease)    Early cataract    Eczema    GERD (gastroesophageal reflux disease)    H/O hiatal hernia    AGE 4 S  NO MEDS   Hemorrhoids    Herpes simplex infection    Hypercholesterolemia    Hyperglycemia    Hyperlipidemia    Hypertension    Macular degeneration    right eye   Obesity    Shortness of breath    W/ EXERTION    Wears glasses    Wears partial dentures     Patient Active Problem List   Diagnosis Date Noted   Abdominal aortic aneurysm (AAA) 05/03/2019    S/P AAA repair 05/03/2019   S/P Redo Sternotomy, CABG x 1, SVG to LAD 10/04/2018   Cervical herniated disc 06/20/2013    Past Surgical History:  Procedure Laterality Date   ABDOMINAL AORTIC ENDOVASCULAR STENT GRAFT N/A 05/03/2019   Procedure: ABDOMINAL AORTIC ENDOVASCULAR STENT GRAFT;  Surgeon: Serafina Mitchell, MD;  Location: [place];  Service: Vascular;  Laterality: N/A;   ANTERIOR CERVICAL DECOMP/DISCECTOMY FUSION N/A 06/20/2013   Procedure: CERVCIAL FIVE-SIX,CERVICAL SIX-SEVEN ANTERIOR CERVICAL DECOMPRESSION WITH FUSION INTERBODY PROSTHESIS PLATING AND PEEK CAGE;  Surgeon: Ophelia Charter, MD;  Location: [facility];  Service: Neurosurgery;  Laterality: N/A;  anterior   CARDIAC CATHETERIZATION     [place]  2007    CATARACT EXTRACTION W/PHACO Right 01/26/2021   Procedure: CATARACT EXTRACTION PHACO AND INTRAOCULAR LENS PLACEMENT (IOC) RIGHT DIABETIC 12.81 01:24.4;  Surgeon: Birder Robson, MD;  Location: [place];  Service: Ophthalmology;  Laterality: Right;  Diabetic   CATARACT EXTRACTION W/PHACO Left 02/09/2021   Procedure: CATARACT EXTRACTION PHACO AND INTRAOCULAR LENS PLACEMENT (IOC) LEFT DIABETIC 6.79 00:41.5;  Surgeon: Birder Robson, MD;  Location: [place];  Service: Ophthalmology;  Laterality: Left;  Diabetic   CHEST TUBE INSERTION Left 10/15/2018   Procedure: INSERTION OF LEFT CHEST TUBE;  Surgeon: Prescott Gum, Collier Salina, MD;  Location: [place];  Service: Thoracic;  Laterality: Left;   COLONOSCOPY WITH PROPOFOL N/A 12/26/2017   Procedure: COLONOSCOPY WITH PROPOFOL;  Surgeon: Lollie Sails, MD;  Location: [facility] ENDOSCOPY;  Service: Endoscopy;  Laterality: N/A;   CORONARY ARTERY BYPASS GRAFT     2007  _0    CORONARY ARTERY BYPASS GRAFT N/A 10/04/2018   Procedure: REDO CORONARY ARTERY BYPASS GRAFTING (CABG) x 1, SVG TO LAD, USING LEFT GREATER SAPHENOUS VEIN HARVESTED ENDOSCOPICALLY;  Surgeon: Ivin Poot, MD;  Location: [place];  Service: Open Heart  Surgery;  Laterality: N/A;   EYE SURGERY     RT EYE LASER (RET DETACHMENT)   RIGHT/LEFT HEART CATH AND CORONARY/GRAFT ANGIOGRAPHY Left 09/05/2018   Procedure: RIGHT/LEFT HEART CATH AND CORONARY/GRAFT ANGIOGRAPHY;  Surgeon: Yolonda Kida, MD;  Location: [place] [facility];  Service: Cardiovascular;  Laterality: Left;   TEE WITHOUT CARDIOVERSION N/A 10/04/2018   Procedure: TRANSESOPHAGEAL ECHOCARDIOGRAM (TEE);  Surgeon: Prescott Gum, Collier Salina, MD;  Location: [place];  Service: Open Heart Surgery;  Laterality: N/A;   TONSILLECTOMY     ULTRASOUND GUIDANCE FOR VASCULAR ACCESS Bilateral 05/03/2019   Procedure: Ultrasound Guidance For Vascular Access, bilateral femoral arteries;  Surgeon: Serafina Mitchell, MD;  Location: [facility];  Service: Vascular;  Laterality: Bilateral;   VIDEO BRONCHOSCOPY WITH INSERTION OF INTERBRONCHIAL VALVE (IBV) N/A 10/26/2018   Procedure: VIDEO BRONCHOSCOPY WITH INSERTION OF INTERBRONCHIAL VALVES (IBV);  Surgeon: Prescott Gum, Collier Salina, MD;  Location: [place];  Service: Thoracic;  Laterality: N/A;   VIDEO BRONCHOSCOPY WITH INSERTION OF INTERBRONCHIAL VALVE (IBV) N/A 12/31/2018   Procedure: VIDEO BRONCHOSCOPY WITH REMOVAL OF INTERBRONCHIAL VALVE (IBV);  Surgeon: Prescott Gum, Collier Salina, MD;  Location: [place];  Service: Thoracic;  Laterality: N/A;    Prior to Admission medications   Medication Sig Start Date End Date Taking? Authorizing Provider  cefdinir (OMNICEF) 300 MG capsule Take 1 capsule (300 mg total) by mouth 2 (two) times daily for 7 days. 02/24/21 03/03/21 Yes Duffy Bruce, MD  ondansetron (ZOFRAN ODT) 4 MG disintegrating tablet Take 1 tablet (4 mg total) by mouth every 8 (eight) hours as needed for nausea or vomiting. 02/24/21  Yes Duffy Bruce, MD  oxyCODONE-acetaminophen (PERCOCET) 5-325 MG tablet Take 1-2 tablets by mouth every 6 (six) hours as needed for severe pain or moderate pain. 02/24/21 02/24/22 Yes Duffy Bruce, MD  tamsulosin (FLOMAX) 0.4 MG CAPS capsule Take 1 capsule  (0.4 mg total) by mouth daily for 5 days. 02/24/21 03/01/21 Yes Duffy Bruce, MD  albuterol (PROVENTIL HFA;VENTOLIN HFA) 108 (90 BASE) MCG/ACT inhaler Inhale 2 puffs into the lungs every 6 (six) hours as needed for wheezing or shortness of breath.    [provider]  albuterol (PROVENTIL) (2.5 MG/3ML) 0.083% nebulizer solution Inhale 2.5 mg into the lungs every 6 (six) hours as needed for wheezing or shortness of breath. 04/27/18   [provider]  aspirin EC 81 MG tablet Take 81 mg by mouth every evening.     [provider]  azelastine (ASTELIN) 0.1 % nasal spray Place 1 spray into both nostrils daily as needed for allergies. Use in each nostril as directed     [provider]  budesonide-formoterol (SYMBICORT) 160-4.5 MCG/ACT inhaler Inhale 2 puffs into the lungs 2 (two) times daily.    [provider]  Cholecalciferol (VITAMIN D3) 50 MCG (2000 UT) TABS Take 2,000 Units by mouth daily.    [provider]  fexofenadine Delma Freeze)  180 MG tablet Take 180 mg by mouth daily.    [provider]  furosemide (LASIX) 20 MG tablet Take 20 mg by mouth as needed.    [provider]  losartan (COZAAR) 25 MG tablet Take 50 mg by mouth daily. 07/08/18   [provider]  metFORMIN (GLUCOPHAGE) 500 MG tablet Take 500 mg by mouth in the morning and at bedtime.    [provider]  metoprolol tartrate (LOPRESSOR) 25 MG tablet Take 1 tablet (25 mg total) by mouth 2 (two) times daily. 11/01/18   Barrett, Erin R, PA-C  montelukast (SINGULAIR) 10 MG tablet Take 10 mg by mouth every evening. 07/02/18   [provider]  Roflumilast (DALIRESP) 250 MCG TABS Take 250 mcg by mouth every evening.    [provider]  rosuvastatin (CRESTOR) 40 MG tablet Take 40 mg by mouth daily.    [provider]  Tiotropium Bromide Monohydrate 2.5 MCG/ACT AERS Inhale 2 puffs into the lungs daily.  03/06/17   [provider]  valACYclovir (VALTREX) 500 MG tablet Take 500 mg by mouth every evening. 06/13/18   [provider]    Allergies Atorvastatin and Sulfa antibiotics  No family history on file.  Social History Social History   Tobacco Use   Smoking status: Former    Packs/day: 2.00    Years: 43.00    Pack years: 86.00    Types: Cigarettes    Quit date: 04/11/2004    Years since quitting: 16.8   Smokeless tobacco: Never  Vaping Use   Vaping Use: Never used  Substance Use Topics   Alcohol use: Yes    Alcohol/week: 7.0 standard drinks    Types: 7 Standard drinks or equivalent per week   Drug use: No    Review of Systems  Review of Systems  Constitutional:  Positive for fatigue. Negative for chills and fever.  HENT:  Negative for sore throat.   Respiratory:  Negative for shortness of breath.   Cardiovascular:  Negative for chest pain.  Gastrointestinal:  Negative for abdominal pain.  Genitourinary:  Positive for flank pain and hematuria.  Musculoskeletal:  Negative for neck pain.  Skin:  Negative for rash and wound.  Allergic/Immunologic: Negative for immunocompromised state.  Neurological:  Negative for weakness and numbness.  Hematological:  Does not bruise/bleed easily.  All other systems reviewed and are negative.   ____________________________________________  PHYSICAL EXAM:      VITAL SIGNS: ED Triage Vitals  Enc Vitals Group     BP 02/24/21 2012 (!) 223/89     Pulse Rate 02/24/21 2012 69     Resp 02/24/21 2012 18     Temp 02/24/21 2012 98.1 F (36.7 C)     Temp Source 02/24/21 2012 Oral     SpO2 02/24/21 2012 99 %     Weight 02/24/21 2014 195 lb (88.5 kg)     Height 02/24/21 2014 _0  (1.753 m)     Head Circumference --      Peak Flow --      Pain Score 02/24/21 2014 8     Pain Loc --      Pain Edu? --      Excl. in [place]? --      Physical Exam Vitals and nursing note reviewed.  Constitutional:      General: He is not in acute distress.    Appearance:  He is well-developed.  HENT:     Head: Normocephalic and  atraumatic.  Eyes:     Conjunctiva/sclera: Conjunctivae normal.  Cardiovascular:     Rate and Rhythm: Normal rate and regular rhythm.     Heart sounds: Normal heart sounds.  Pulmonary:     Effort: Pulmonary effort is normal. No respiratory distress.     Breath sounds: No wheezing.  Abdominal:     General: There is no distension.     Tenderness: There is abdominal tenderness (moderate, R flank, no overt CVAT).  Musculoskeletal:     Cervical back: Neck supple.  Skin:    General: Skin is warm.     Capillary Refill: Capillary refill takes less than 2 seconds.     Findings: No rash.  Neurological:     Mental Status: He is alert and oriented to person, place, and time.     Motor: No abnormal muscle tone.      ____________________________________________   LABS (all labs ordered are listed, but only abnormal results are displayed)  Labs Reviewed  URINALYSIS, ROUTINE W REFLEX MICROSCOPIC - Abnormal; Notable for the following components:      Result Value   Color, Urine AMBER (*)    APPearance CLOUDY (*)    Glucose, UA 150 (*)    Hgb urine dipstick LARGE (*)    Protein, ur 100 (*)    RBC / HPF >50 (*)    All other components within normal limits  BASIC METABOLIC PANEL - Abnormal; Notable for the following components:   Glucose, Bld 118 (*)    All other components within normal limits  URINE CULTURE  CBC    ____________________________________________  EKG:  ________________________________________  RADIOLOGY All imaging, including plain films, CT scans, and ultrasounds, independently reviewed by me, and interpretations confirmed via formal radiology reads.  ED MD interpretation:   CT stone: 4 x 6 mm distal right ureteral stone, mild hydro and perinephric stranding, possible calyceal leak.  Incidental note of prominent appendix but there is air throughout the lumen with no inflammatory changes.  Official  radiology report(s): CT Renal Stone Study  Result Date: 02/24/2021 CLINICAL DATA:  Right flank and scrotal pain.  Stone suspected. EXAM: CT ABDOMEN AND PELVIS WITHOUT CONTRAST TECHNIQUE: Multidetector CT imaging of the abdomen and pelvis was performed following the standard protocol without IV contrast. COMPARISON:  CTA abdomen and pelvis 05/17/2019 FINDINGS: Lower chest: Lung bases show scarring and COPD change without a visible infiltrate. Hepatobiliary: No focal liver abnormality is seen. No gallstones, gallbladder wall thickening, or biliary dilatation. Pancreas: Unremarkable. No pancreatic ductal dilatation or surrounding inflammatory changes. Spleen: Normal in size without focal abnormality. Adrenals/Urinary Tract: There is no adrenal mass no focal abnormality of unenhanced renal cortex. There is a 3 mm nonobstructive caliceal stone in the inferior pole right kidney. There is no left intrarenal stone. Left perinephric stranding is unchanged but right perinephric stranding is increased with scattered right perinephric fluid and edema, and mild hydroureteronephrosis above an upper pelvic distal right ureteral stone measuring 4 x 6 mm, with upstream periureteral edema. Both ureters are otherwise clear. There is mild bladder thickening versus nondistention. Stomach/Bowel: No dilatation or wall thickening of the bowel is seen. There are colonic diverticula without evidence of colitis, diverticulitis or bowel obstruction. The appendix is prominent in caliber measuring 9.6 mm but does not show adjacent inflammatory changes and there is scattered air throughout the appendix including the tip. Previously the appendix was 8.1 mm. Vascular/Lymphatic: Aortoiliac calcific plaques are again noted with 5.1 cm stable size infrarenal AAA excluded  by an aorto bi-iliac stent graft. No adenopathy is seen. Reproductive: Mildly prominent prostate gland, 4.4 cm transverse. Other: There are small umbilical and inguinal fat  hernias there is right perinephric fluid but no other free fluid, no free air or free hemorrhage. Musculoskeletal: There is osteopenia with degenerative changes in the lumbar spine, slight lumbar levoscoliosis and right foraminal encroachment by osteophytes at L3-4 and L4-5. No suspicious or destructive bone lesion. IMPRESSION: 1. 4 x 6 mm distal right ureteral stone in the upper true pelvis, with mild hydroureteronephrosis, moderate perinephric stranding and edema, and perinephric fluid which could be due to obstructive uropathy or due to a caliceal leak. Please correlate clinically for infectious complication. 2. Prominent appendix, but contains air throughout its lumen and there are no inflammatory changes. Clinical correlation advised for potential early appendicitis. There are diverticula without evidence of diverticulitis. 3. Cystitis versus bladder nondistention with mild prostatomegaly. 4. 5.1 cm stable size AAA excluded by aortobi-iliac stent graft. 5. Additional findings as above. Electronically Signed   By: Telford Nab M.D.   On: 02/24/2021 21:59    ____________________________________________  PROCEDURES   Procedure(s) performed (including Critical Care):  Procedures  ____________________________________________  INITIAL IMPRESSION / MDM / [place] / ED COURSE  As part of my medical decision making, I reviewed the following data within the [place] notes reviewed and incorporated, Old chart reviewed, Notes from prior ED visits, and [place] Controlled Substance Database       *XAVIER MUNGER was evaluated in Emergency Department on 02/25/2021 for the symptoms described in the history of present illness. He was evaluated in the context of the global COVID-19 pandemic, which necessitated consideration that the patient might be at risk for infection with the SARS-CoV-2 virus that causes COVID-19. Institutional protocols and algorithms that pertain  to the evaluation of patients at risk for COVID-19 are in a state of rapid change based on information released by regulatory bodies including the CDC and federal and state organizations. These policies and algorithms were followed during the patient's care in the ED.  Some ED evaluations and interventions may be delayed as a result of limited staffing during the pandemic.*     Medical Decision Making: 70 year old well-appearing male here with right flank pain.  Pain radiates to his groin but he has no signs of testicular torsion.  Suspect renal colic.  CBC shows no leukocytosis or anemia.  BMP shows normal renal function.  UA with hematuria but no pyuria or bacteria to suggest infection.  CT renal stone obtained, reviewed as above.  Patient has an obstructing stone with possible mild calyceal rupture.  As mentioned, he has no fever, chills, leukocytosis, tachycardia, SIRS, or evidence to suggest infection.  The incidental note of prominent appendix is likely incidental as he has no right lower quadrant tenderness on exam, leukocytosis, and history is highly consistent with stone.  He has had no anorexia or nausea.  Patient has had significant improvement and near resolution of pain with symptomatic control in the ED.  Will treat symptomatically for renal colic with outpatient urology follow-up.  Given the question of calyceal rupture, though he clinically has no evidence to suggest infection now, will give a dose of Rocephin, and discharged with a brief course of antibiotics.  Refer him to urology as an outpatient.  Return precautions given in detail including any worsening pain, signs of infection, or other concerning symptoms.  ____________________________________________  FINAL CLINICAL IMPRESSION(S) / ED DIAGNOSES  Final diagnoses:  Kidney stone  Flank pain     MEDICATIONS GIVEN DURING THIS VISIT:  Medications  morphine 4 MG/ML injection 6 mg (6 mg Intravenous Given 02/24/21 2135)   ondansetron (ZOFRAN) injection 4 mg (4 mg Intravenous Given 02/24/21 2134)  ketorolac (TORADOL) 30 MG/ML injection 15 mg (15 mg Intravenous Given 02/24/21 2251)  morphine 4 MG/ML injection 4 mg (4 mg Intravenous Given 02/24/21 2252)  sodium chloride 0.9 % bolus 500 mL (0 mLs Intravenous Stopped 02/24/21 2340)  cefTRIAXone (ROCEPHIN) 2 g in sodium chloride 0.9 % 100 mL IVPB (0 g Intravenous Stopped 02/24/21 2319)     ED Discharge Orders          Ordered    oxyCODONE-acetaminophen (PERCOCET) 5-325 MG tablet  Every 6 hours PRN        02/24/21 2337    ondansetron (ZOFRAN ODT) 4 MG disintegrating tablet  Every 8 hours PRN        02/24/21 2337    tamsulosin (FLOMAX) 0.4 MG CAPS capsule  Daily        02/24/21 2337    cefdinir (OMNICEF) 300 MG capsule  2 times daily        02/24/21 2337             Note:  This document was prepared using Dragon voice recognition software and may include unintentional dictation errors.   Duffy Bruce, MD 02/25/21 [phone]

## 2021-02-24 NOTE — ED Triage Notes (Signed)
Pt presents with right sided flank pain, and right testicular pain. States that his blood was a rusty color. Pain hit him all of a sudden tonight while in the car on his way home.

## 2021-02-25 LAB — URINE CULTURE: Culture: NO GROWTH

## 2021-03-02 ENCOUNTER — Other Ambulatory Visit: Payer: Self-pay

## 2021-03-02 ENCOUNTER — Other Ambulatory Visit: Payer: Self-pay | Admitting: *Deleted

## 2021-03-02 ENCOUNTER — Ambulatory Visit (INDEPENDENT_AMBULATORY_CARE_PROVIDER_SITE_OTHER): Payer: Medicare Other | Admitting: Urology

## 2021-03-02 ENCOUNTER — Other Ambulatory Visit: Payer: Self-pay | Admitting: Urology

## 2021-03-02 ENCOUNTER — Encounter: Payer: Self-pay | Admitting: Urology

## 2021-03-02 ENCOUNTER — Ambulatory Visit
Admission: RE | Admit: 2021-03-02 | Discharge: 2021-03-02 | Disposition: A | Discharge status: Home / Self Care | Payer: Medicare Other | Attending: Urology | Admitting: Urology

## 2021-03-02 ENCOUNTER — Ambulatory Visit
Admission: RE | Admit: 2021-03-02 | Discharge: 2021-03-02 | Disposition: A | Discharge status: Home / Self Care | Payer: Medicare Other | Source: Ambulatory Visit | Attending: Urology | Admitting: Urology

## 2021-03-02 VITALS — BP 160/89 | HR 70 | Ht 69.0 in | Wt 200.0 lb

## 2021-03-02 DIAGNOSIS — N200 Calculus of kidney: Secondary | ICD-10-CM | POA: Insufficient documentation

## 2021-03-02 DIAGNOSIS — N201 Calculus of ureter: Secondary | ICD-10-CM

## 2021-03-02 DIAGNOSIS — N202 Calculus of kidney with calculus of ureter: Secondary | ICD-10-CM | POA: Diagnosis not present

## 2021-03-02 MED ORDER — CEFAZOLIN SODIUM-DEXTROSE 2-4 GM/100ML-% IV SOLN
2.0000 g | INTRAVENOUS | Status: AC
  Administered 2021-03-03: 2 g via INTRAVENOUS

## 2021-03-02 MED ORDER — SENNOSIDES-DOCUSATE SODIUM 8.6-50 MG PO TABS: 2.0000 | ORAL_TABLET | Freq: Two times a day (BID) | ORAL | 0 refills | Status: DC

## 2021-03-02 NOTE — Patient Instructions (Signed)
Laser Therapy for Kidney Stones Laser therapy for kidney stones is a procedure to break up small, hard mineral deposits that form in the kidney (kidney stones). The procedure is done using a device that produces a focused beam of light (laser). The laser breaks up kidney stones into pieces that are small enough to be passed out of the body through urination or removed from the body during the procedure. You may need laser therapy if you have kidney stones that are painful or block your urinary tract. This procedure is done by inserting a tube (ureteroscope) into your kidney through the urethral opening. The urethra is the part of the body that drains urine from the bladder. In women, the urethra opens above the vaginal opening. In men, the urethra opens at the tip of the penis. The ureteroscope is inserted through the urethra, and surgical instruments are moved through the bladder and the muscular tube that connects the kidney to the bladder (ureter) until they reach the kidney. Tell a health care provider about: Any allergies you have. All medicines you are taking, including vitamins, herbs, eye drops, creams, and over-the-counter medicines. Any problems you or family members have had with anesthetic medicines. Any blood disorders you have. Any surgeries you have had. Any medical conditions you have. Whether you are pregnant or may be pregnant. What are the risks? Generally, this is a safe procedure. However, problems may occur, including: Infection. Bleeding. Allergic reactions to medicines. Damage to the urethra, bladder, or ureter. Urinary tract infection (UTI). Narrowing of the urethra (urethral stricture). Difficulty passing urine. Blockage of the kidney caused by a fragment of kidney stone. What happens before the procedure? Medicines Ask your health care provider about: Changing or stopping your regular medicines. This is especially important if you are taking diabetes medicines or  blood thinners. Taking medicines such as aspirin and ibuprofen. These medicines can thin your blood. Do not take these medicines unless your health care provider tells you to take them. Taking over-the-counter medicines, vitamins, herbs, and supplements. Eating and drinking Follow instructions from your health care provider about eating and drinking, which may include: 8 hours before the procedure - stop eating heavy meals or foods, such as meat, fried foods, or fatty foods. 6 hours before the procedure - stop eating light meals or foods, such as toast or cereal. 6 hours before the procedure - stop drinking milk or drinks that contain milk. 2 hours before the procedure - stop drinking clear liquids. Staying hydrated Follow instructions from your health care provider about hydration, which may include: Up to 2 hours before the procedure - you may continue to drink clear liquids, such as water, clear fruit juice, black coffee, and plain tea.  General instructions You may have a physical exam before the procedure. You may also have tests, such as imaging tests and blood or urine tests. If your ureter is too narrow, your health care provider may place a soft, flexible tube (stent) inside of it. The stent may be placed days or weeks before your laser therapy procedure. Plan to have someone take you home from the hospital or clinic. If you will be going home right after the procedure, plan to have someone stay with you for 24 hours. Do not use any products that contain nicotine or tobacco for at least 4 weeks before the procedure. These products include cigarettes, e-cigarettes, and chewing tobacco. If you need help quitting, ask your health care provider. Ask your health care provider: How your   surgical site will be marked or identified. What steps will be taken to help prevent infection. These may include: Removing hair at the surgery site. Washing skin with a germ-killing soap. Taking antibiotic  medicine. What happens during the procedure?  An IV will be inserted into one of your veins. You will be given one or more of the following: A medicine to help you relax (sedative). A medicine to numb the area (local anesthetic). A medicine to make you fall asleep (general anesthetic). A ureteroscope will be inserted into your urethra. The ureteroscope will send images to a video screen in the operating room to guide your surgeon to the area of your kidney that will be treated. A small, flexible tube will be threaded through the ureteroscope and into your bladder and ureter, up to your kidney. The laser device will be inserted into your kidney through the tube. Your surgeon will pulse the laser on and off to break up kidney stones. A surgical instrument that has a tiny wire basket may be inserted through the tube into your kidney to remove the pieces of broken kidney stone. The procedure may vary among health care providers and hospitals. What happens after the procedure? Your blood pressure, heart rate, breathing rate, and blood oxygen level will be monitored until you leave the hospital or clinic. You will be given pain medicine as needed. You may continue to receive antibiotics. You may have a stent temporarily placed in your ureter. Do not drive for 24 hours if you were given a sedative during your procedure. You may be given a strainer to collect any stone fragments that you pass in your urine. Your health care provider may have these tested. Summary Laser therapy for kidney stones is a procedure to break up kidney stones into pieces that are small enough to be passed out of the body through urination or removed during the procedure. Follow instructions from your health care provider about eating and drinking before the procedure. During the procedure, the ureteroscope will send images to a video screen to guide your surgeon to the area of your kidney that will be treated. Do not drive  for 24 hours if you were given a sedative during your procedure. This information is not intended to replace advice given to you by your health care provider. Make sure you discuss any questions you have with your health care provider. Document Revised: 11/30/2020 Document Reviewed: 11/30/2020 Elsevier Patient Education  2022 [employer].   Ureteral Stent Implantation Ureteral stent implantation is a procedure to insert (implant) a flexible, soft, plastic tube (stent) into a ureter. Ureters are the tube-like parts of the body that drain urine from the kidneys. The stent supports the ureter while it heals and helps to drain urine. You may have a ureteral stent implanted after having a procedure to remove a blockage from the ureter (ureterolysis or pyeloplasty). You may also have a stent implanted to open the flow of urine when you have a blockage caused by a kidney stone, tumor, blood clot, or infection. You have two ureters, one on each side of the body. The ureters connect the kidneys to the organ that holds urine until it passes out of the body (bladder). The stent is placed so that one end is in the kidney, and one end is in the bladder. The stent is usually taken out after your ureter has healed. Depending on your condition, you may have a stent for just a few weeks, or you may  have a long-term stent that will need to be replaced every few months. Tell a health care provider about: Any allergies you have. All medicines you are taking, including vitamins, herbs, eye drops, creams, and over-the-counter medicines. Any problems you or family members have had with anesthetic medicines. Any blood disorders you have. Any surgeries you have had. Any medical conditions you have. Whether you are pregnant or may be pregnant. What are the risks? Generally, this is a safe procedure. However, problems may occur, including: Infection. Bleeding. Allergic reactions to medicines. Damage to other structures  or organs. Tearing (perforation) of the ureter is possible. Movement of the stent away from where it is placed during surgery (migration). What happens before the procedure? Medicines Ask your health care provider about: Changing or stopping your regular medicines. This is especially important if you are taking diabetes medicines or blood thinners. Taking medicines such as aspirin and ibuprofen. These medicines can thin your blood. Do not take these medicines unless your health care provider tells you to take them. Taking over-the-counter medicines, vitamins, herbs, and supplements. Eating and drinking Follow instructions from your health care provider about eating and drinking, which may include: 8 hours before the procedure - stop eating heavy meals or foods, such as meat, fried foods, or fatty foods. 6 hours before the procedure - stop eating light meals or foods, such as toast or cereal. 6 hours before the procedure - stop drinking milk or drinks that contain milk. 2 hours before the procedure - stop drinking clear liquids. Staying hydrated Follow instructions from your health care provider about hydration, which may include: Up to 2 hours before the procedure - you may continue to drink clear liquids, such as water, clear fruit juice, black coffee, and plain tea. General instructions Do not drink alcohol. Do not use any products that contain nicotine or tobacco for at least 4 weeks before the procedure. These products include cigarettes, e-cigarettes, and chewing tobacco. If you need help quitting, ask your health care provider. You may have an exam or testing, such as imaging or blood tests. Ask your health care provider what steps will be taken to help prevent infection. These may include: Removing hair at the surgery site. Washing skin with a germ-killing soap. Taking antibiotic medicine. Plan to have someone take you home from the hospital or clinic. If you will be going home right  after the procedure, plan to have someone with you for 24 hours. What happens during the procedure? An IV will be inserted into one of your veins. You may be given a medicine to help you relax (sedative). You may be given a medicine to make you fall asleep (general anesthetic). A thin, tube-shaped instrument with a light and tiny camera at the end (cystoscope) will be inserted into your urethra. The urethra is the tube that drains urine from the bladder out of the body. In men, the urethra opens at the end of the penis. In women, the urethra opens in front of the vaginal opening. The cystoscope will be passed into your bladder. A thin wire (guide wire) will be passed through your bladder and into your ureter. This is used to guide the stent into your ureter. The stent will be inserted into your ureter. The guide wire and the cystoscope will be removed. A flexible tube (catheter) may be inserted through your urethra so that one end is in your bladder. This helps to drain urine from your bladder. The procedure may vary among hospitals   and health care providers. What happens after the procedure? Your blood pressure, heart rate, breathing rate, and blood oxygen level will be monitored until you leave the hospital or clinic. You may continue to receive medicine and fluids through an IV. You may have some soreness or pain in your abdomen and urethra. Medicines will be available to help you. You will be encouraged to get up and walk around as soon as you can. You may have a catheter draining your urine. You will have some blood in your urine. Do not drive for 24 hours if you were given a sedative during your procedure. Summary Ureteral stent implantation is a procedure to insert a flexible, soft, plastic tube (stent) into a ureter. You may have a stent implanted to support the ureter while it heals after a procedure or to open the flow of urine if there is a blockage. Follow instructions from your  health care provider about taking medicines and about eating and drinking before the procedure. Depending on your condition, you may have a stent for just a few weeks, or you may have a long-term stent that will need to be replaced every few months. This information is not intended to replace advice given to you by your health care provider. Make sure you discuss any questions you have with your health care provider. Document Revised: 01/02/2018 Document Reviewed: 01/03/2018 Elsevier Patient Education  2022 [employer].

## 2021-03-02 NOTE — Progress Notes (Unsigned)
Surgical Physician Order Form  * Scheduling expectation :  Wednesday 11/23 at noon  *Length of Case: 30 minutes  *Clearance needed: no  *Anticoagulation Instructions: May continue all anticoagulants  *Aspirin Instructions: Ok to continue Aspirin  *Post-op visit Date/Instructions: TBD  *Diagnosis: Right Ureteral Stone  *Procedure: Ureteroscopy w/laser lithotripsy & stent placement/exchange (68616)  -Admit type: OUTpatient  -Anesthesia: General  -VTE Prophylaxis Standing Order SCD's       Other:   -Standing Lab Orders Per Anesthesia    Lab other: None  -Standing Test orders EKG/Chest x-ray per Anesthesia       Test other:   - Medications:     Ancef 2gm IV   Other Instructions:

## 2021-03-02 NOTE — H&P (View-Only) (Signed)
03/02/21 12:53 PM   Melvin Brewer 27-Dec-1950 [id number]  CC: Right-sided flank pain, nausea/vomiting  HPI: 70 year old male with 1 week of right-sided flank and groin pain and nausea and vomiting.  CT on 02/24/2021 showed a 6 mm right mid ureteral stone and a smaller 2 mm right lower pole stone.  He continues to have flank pain despite Flomax and narcotics.  He is also had severe constipation over the last week.  He has never had kidney stones before.  He denies any fevers or chills.  Urine culture 11/16 showed no growth.  KUB today shows slight migration of the stone to the right distal ureter.   PMH: Past Medical History:  Diagnosis Date   AAA (abdominal aortic aneurysm)    Abdominal aortic aneurysm (AAA)    Allergic rhinitis    Anginal pain (HCC)    Centrilobular emphysema (HCC)    Cervical spine degeneration    CHF (congestive heart failure) (HCC)    COPD (chronic obstructive pulmonary disease) (HCC)    Coronary artery disease    Diabetes mellitus without complication (HCC)    Diverticulitis    DJD (degenerative joint disease)    Early cataract    Eczema    GERD (gastroesophageal reflux disease)    H/O hiatal hernia    AGE 25 S  NO MEDS   Hemorrhoids    Herpes simplex infection    Hypercholesterolemia    Hyperglycemia    Hyperlipidemia    Hypertension    Macular degeneration    right eye   Obesity    Shortness of breath    W/ EXERTION    Wears glasses    Wears partial dentures     Surgical History: Past Surgical History:  Procedure Laterality Date   ABDOMINAL AORTIC ENDOVASCULAR STENT GRAFT N/A 05/03/2019   Procedure: ABDOMINAL AORTIC ENDOVASCULAR STENT GRAFT;  Surgeon: Serafina Mitchell, MD;  Location: [place];  Service: Vascular;  Laterality: N/A;   ANTERIOR CERVICAL DECOMP/DISCECTOMY FUSION N/A 06/20/2013   Procedure: CERVCIAL FIVE-SIX,CERVICAL SIX-SEVEN ANTERIOR CERVICAL DECOMPRESSION WITH FUSION INTERBODY PROSTHESIS PLATING AND PEEK CAGE;  Surgeon:  Ophelia Charter, MD;  Location: [facility];  Service: Neurosurgery;  Laterality: N/A;  anterior   CARDIAC CATHETERIZATION       2007    CATARACT EXTRACTION W/PHACO Right 01/26/2021   Procedure: CATARACT EXTRACTION PHACO AND INTRAOCULAR LENS PLACEMENT (IOC) RIGHT DIABETIC 12.81 01:24.4;  Surgeon: Birder Robson, MD;  Location: [place];  Service: Ophthalmology;  Laterality: Right;  Diabetic   CATARACT EXTRACTION W/PHACO Left 02/09/2021   Procedure: CATARACT EXTRACTION PHACO AND INTRAOCULAR LENS PLACEMENT (IOC) LEFT DIABETIC 6.79 00:41.5;  Surgeon: Birder Robson, MD;  Location: [place];  Service: Ophthalmology;  Laterality: Left;  Diabetic   CHEST TUBE INSERTION Left 10/15/2018   Procedure: INSERTION OF LEFT CHEST TUBE;  Surgeon: Ivin Poot, MD;  Location: [facility];  Service: Thoracic;  Laterality: Left;   COLONOSCOPY WITH PROPOFOL N/A 12/26/2017   Procedure: COLONOSCOPY WITH PROPOFOL;  Surgeon: Lollie Sails, MD;  Location: [facility];  Service: Endoscopy;  Laterality: N/A;   CORONARY ARTERY BYPASS GRAFT     2007  _0    CORONARY ARTERY BYPASS GRAFT N/A 10/04/2018   Procedure: REDO CORONARY ARTERY BYPASS GRAFTING (CABG) x 1, SVG TO LAD, USING LEFT GREATER SAPHENOUS VEIN HARVESTED ENDOSCOPICALLY;  Surgeon: Ivin Poot, MD;  Location: [place];  Service: Open Heart Surgery;  Laterality: N/A;   EYE SURGERY  RT EYE LASER (RET DETACHMENT)   RIGHT/LEFT HEART CATH AND CORONARY/GRAFT ANGIOGRAPHY Left 09/05/2018   Procedure: RIGHT/LEFT HEART CATH AND CORONARY/GRAFT ANGIOGRAPHY;  Surgeon: Yolonda Kida, MD;  Location: [place] [facility];  Service: Cardiovascular;  Laterality: Left;   TEE WITHOUT CARDIOVERSION N/A 10/04/2018   Procedure: TRANSESOPHAGEAL ECHOCARDIOGRAM (TEE);  Surgeon: Prescott Gum, Collier Salina, MD;  Location: [place];  Service: Open Heart Surgery;  Laterality: N/A;   TONSILLECTOMY     ULTRASOUND GUIDANCE FOR VASCULAR ACCESS Bilateral  05/03/2019   Procedure: Ultrasound Guidance For Vascular Access, bilateral femoral arteries;  Surgeon: Serafina Mitchell, MD;  Location: [facility];  Service: Vascular;  Laterality: Bilateral;   VIDEO BRONCHOSCOPY WITH INSERTION OF INTERBRONCHIAL VALVE (IBV) N/A 10/26/2018   Procedure: VIDEO BRONCHOSCOPY WITH INSERTION OF INTERBRONCHIAL VALVES (IBV);  Surgeon: Prescott Gum, Collier Salina, MD;  Location: [place];  Service: Thoracic;  Laterality: N/A;   VIDEO BRONCHOSCOPY WITH INSERTION OF INTERBRONCHIAL VALVE (IBV) N/A 12/31/2018   Procedure: VIDEO BRONCHOSCOPY WITH REMOVAL OF INTERBRONCHIAL VALVE (IBV);  Surgeon: Prescott Gum, Collier Salina, MD;  Location: [facility] OR;  Service: Thoracic;  Laterality: N/A;    Family History: No family history on file.  Social History:  reports that he quit smoking about 16 years ago. His smoking use included cigarettes. He has a 86.00 pack-year smoking history. He has never used smokeless tobacco. He reports current alcohol use of about 7.0 standard drinks per week. He reports that he does not use drugs.  Physical Exam: BP (!) 160/89   Pulse 70   Ht _0  (1.753 m)   Wt 200 lb (90.7 kg)   BMI 29.53 kg/m    Constitutional:  Alert and oriented, No acute distress. Cardiovascular: No clubbing, cyanosis, or edema. Respiratory: Normal respiratory effort, no increased work of breathing. GI: Abdomen is soft, nontender, nondistended, no abdominal masses  Laboratory Data: Reviewed, see HPI  Pertinent Imaging: I have personally viewed and interpreted the CT as well as the KUB that shows slight distal progression of the 6 mm right ureteral stone to the right distal ureter.  Assessment & Plan:   70 year old male with a 6 mm right distal ureteral stone and poorly controlled pain and nausea vomiting over the last week, no clinical or laboratory evidence of infection and culture negative.  We discussed various treatment options for urolithiasis including observation with or without medical expulsive  therapy, shockwave lithotripsy (SWL), ureteroscopy and laser lithotripsy with stent placement, and percutaneous nephrolithotomy.  We discussed that management is based on stone size, location, density, patient co-morbidities, and patient preference.   Stones <78m in size have a >80% spontaneous passage rate. Data surrounding the use of tamsulosin for medical expulsive therapy is controversial, but meta analyses suggests it is most efficacious for distal stones between 5-156min size. Possible side effects include dizziness/lightheadedness, and retrograde ejaculation.  SWL has a lower stone free rate in a single procedure, but also a lower complication rate compared to ureteroscopy and avoids a stent and associated stent related symptoms. Possible complications include renal hematoma, steinstrasse, and need for additional treatment.  Ureteroscopy with laser lithotripsy and stent placement has a higher stone free rate than SWL in a single procedure, however increased complication rate including possible infection, ureteral injury, bleeding, and stent related morbidity. Common stent related symptoms include dysuria, urgency/frequency, and flank pain.  He would like to proceed with right ureteroscopy, laser lithotripsy, stent placement this week, will add to the OR schedule tomorrow before the holiday  Nickolas Madrid, MD 03/02/2021  [facility] [address], [place], [place] [address] [phone]

## 2021-03-02 NOTE — Progress Notes (Signed)
03/02/21 12:53 PM   Melvin Brewer 27-Dec-1950 [id number]  CC: Right-sided flank pain, nausea/vomiting  HPI: 70 year old male with 1 week of right-sided flank and groin pain and nausea and vomiting.  CT on 02/24/2021 showed a 6 mm right mid ureteral stone and a smaller 2 mm right lower pole stone.  He continues to have flank pain despite Flomax and narcotics.  He is also had severe constipation over the last week.  He has never had kidney stones before.  He denies any fevers or chills.  Urine culture 11/16 showed no growth.  KUB today shows slight migration of the stone to the right distal ureter.   PMH: Past Medical History:  Diagnosis Date   AAA (abdominal aortic aneurysm)    Abdominal aortic aneurysm (AAA)    Allergic rhinitis    Anginal pain (HCC)    Centrilobular emphysema (HCC)    Cervical spine degeneration    CHF (congestive heart failure) (HCC)    COPD (chronic obstructive pulmonary disease) (HCC)    Coronary artery disease    Diabetes mellitus without complication (HCC)    Diverticulitis    DJD (degenerative joint disease)    Early cataract    Eczema    GERD (gastroesophageal reflux disease)    H/O hiatal hernia    AGE 25 S  NO MEDS   Hemorrhoids    Herpes simplex infection    Hypercholesterolemia    Hyperglycemia    Hyperlipidemia    Hypertension    Macular degeneration    right eye   Obesity    Shortness of breath    W/ EXERTION    Wears glasses    Wears partial dentures     Surgical History: Past Surgical History:  Procedure Laterality Date   ABDOMINAL AORTIC ENDOVASCULAR STENT GRAFT N/A 05/03/2019   Procedure: ABDOMINAL AORTIC ENDOVASCULAR STENT GRAFT;  Surgeon: Serafina Mitchell, MD;  Location: [place];  Service: Vascular;  Laterality: N/A;   ANTERIOR CERVICAL DECOMP/DISCECTOMY FUSION N/A 06/20/2013   Procedure: CERVCIAL FIVE-SIX,CERVICAL SIX-SEVEN ANTERIOR CERVICAL DECOMPRESSION WITH FUSION INTERBODY PROSTHESIS PLATING AND PEEK CAGE;  Surgeon:  Ophelia Charter, MD;  Location: [facility];  Service: Neurosurgery;  Laterality: N/A;  anterior   CARDIAC CATHETERIZATION       2007    CATARACT EXTRACTION W/PHACO Right 01/26/2021   Procedure: CATARACT EXTRACTION PHACO AND INTRAOCULAR LENS PLACEMENT (IOC) RIGHT DIABETIC 12.81 01:24.4;  Surgeon: Birder Robson, MD;  Location: [place];  Service: Ophthalmology;  Laterality: Right;  Diabetic   CATARACT EXTRACTION W/PHACO Left 02/09/2021   Procedure: CATARACT EXTRACTION PHACO AND INTRAOCULAR LENS PLACEMENT (IOC) LEFT DIABETIC 6.79 00:41.5;  Surgeon: Birder Robson, MD;  Location: [place];  Service: Ophthalmology;  Laterality: Left;  Diabetic   CHEST TUBE INSERTION Left 10/15/2018   Procedure: INSERTION OF LEFT CHEST TUBE;  Surgeon: Ivin Poot, MD;  Location: [place];  Service: Thoracic;  Laterality: Left;   COLONOSCOPY WITH PROPOFOL N/A 12/26/2017   Procedure: COLONOSCOPY WITH PROPOFOL;  Surgeon: Lollie Sails, MD;  Location: [facility] END[facility]COPY;  Service: Endoscopy;  Laterality: N/A;   CORONARY ARTERY BYPASS GRAFT     2007  _0    CORONARY ARTERY BYPASS GRAFT N/A 10/04/2018   Procedure: REDO CORONARY ARTERY BYPASS GRAFTING (CABG) x 1, SVG TO LAD, USING LEFT GREATER SAPHENOUS VEIN HARVESTED ENDOSCOPICALLY;  Surgeon: Ivin Poot, MD;  Location: [place];  Service: Open Heart Surgery;  Laterality: N/A;   EYE SURGERY  RT EYE LASER (RET DETACHMENT)   RIGHT/LEFT HEART CATH AND CORONARY/GRAFT ANGIOGRAPHY Left 09/05/2018   Procedure: RIGHT/LEFT HEART CATH AND CORONARY/GRAFT ANGIOGRAPHY;  Surgeon: Yolonda Kida, MD;  Location: [place] [facility];  Service: Cardiovascular;  Laterality: Left;   TEE WITHOUT CARDIOVERSION N/A 10/04/2018   Procedure: TRANSESOPHAGEAL ECHOCARDIOGRAM (TEE);  Surgeon: Prescott Gum, Collier Salina, MD;  Location: [place];  Service: Open Heart Surgery;  Laterality: N/A;   TONSILLECTOMY     ULTRASOUND GUIDANCE FOR VASCULAR ACCESS Bilateral  05/03/2019   Procedure: Ultrasound Guidance For Vascular Access, bilateral femoral arteries;  Surgeon: Serafina Mitchell, MD;  Location: [facility];  Service: Vascular;  Laterality: Bilateral;   VIDEO BRONCHOSCOPY WITH INSERTION OF INTERBRONCHIAL VALVE (IBV) N/A 10/26/2018   Procedure: VIDEO BRONCHOSCOPY WITH INSERTION OF INTERBRONCHIAL VALVES (IBV);  Surgeon: Prescott Gum, Collier Salina, MD;  Location: [place];  Service: Thoracic;  Laterality: N/A;   VIDEO BRONCHOSCOPY WITH INSERTION OF INTERBRONCHIAL VALVE (IBV) N/A 12/31/2018   Procedure: VIDEO BRONCHOSCOPY WITH REMOVAL OF INTERBRONCHIAL VALVE (IBV);  Surgeon: Prescott Gum, Collier Salina, MD;  Location: [facility] OR;  Service: Thoracic;  Laterality: N/A;    Family History: No family history on file.  Social History:  reports that he quit smoking about 16 years ago. His smoking use included cigarettes. He has a 86.00 pack-year smoking history. He has never used smokeless tobacco. He reports current alcohol use of about 7.0 standard drinks per week. He reports that he does not use drugs.  Physical Exam: BP (!) 160/89   Pulse 70   Ht _0  (1.753 m)   Wt 200 lb (90.7 kg)   BMI 29.53 kg/m    Constitutional:  Alert and oriented, No acute distress. Cardiovascular: No clubbing, cyanosis, or edema. Respiratory: Normal respiratory effort, no increased work of breathing. GI: Abdomen is soft, nontender, nondistended, no abdominal masses  Laboratory Data: Reviewed, see HPI  Pertinent Imaging: I have personally viewed and interpreted the CT as well as the KUB that shows slight distal progression of the 6 mm right ureteral stone to the right distal ureter.  Assessment & Plan:   70 year old male with a 6 mm right distal ureteral stone and poorly controlled pain and nausea vomiting over the last week, no clinical or laboratory evidence of infection and culture negative.  We discussed various treatment options for urolithiasis including observation with or without medical expulsive  therapy, shockwave lithotripsy (SWL), ureteroscopy and laser lithotripsy with stent placement, and percutaneous nephrolithotomy.  We discussed that management is based on stone size, location, density, patient co-morbidities, and patient preference.   Stones <78m in size have a >80% spontaneous passage rate. Data surrounding the use of tamsulosin for medical expulsive therapy is controversial, but meta analyses suggests it is most efficacious for distal stones between 5-156min size. Possible side effects include dizziness/lightheadedness, and retrograde ejaculation.  SWL has a lower stone free rate in a single procedure, but also a lower complication rate compared to ureteroscopy and avoids a stent and associated stent related symptoms. Possible complications include renal hematoma, steinstrasse, and need for additional treatment.  Ureteroscopy with laser lithotripsy and stent placement has a higher stone free rate than SWL in a single procedure, however increased complication rate including possible infection, ureteral injury, bleeding, and stent related morbidity. Common stent related symptoms include dysuria, urgency/frequency, and flank pain.  He would like to proceed with right ureteroscopy, laser lithotripsy, stent placement this week, will add to the OR schedule tomorrow before the holiday  Nickolas Madrid, MD 03/02/2021  [facility] [address], [place], [place] [address] [phone]

## 2021-03-03 ENCOUNTER — Ambulatory Visit: Payer: Medicare Other | Admitting: Registered Nurse

## 2021-03-03 ENCOUNTER — Encounter: Payer: Self-pay | Admitting: Urology

## 2021-03-03 ENCOUNTER — Encounter
Admission: RE | Disposition: A | Discharge status: Home / Self Care | Payer: Self-pay | Source: Home / Self Care | Attending: Urology

## 2021-03-03 ENCOUNTER — Other Ambulatory Visit: Payer: Self-pay

## 2021-03-03 ENCOUNTER — Ambulatory Visit: Payer: Medicare Other

## 2021-03-03 ENCOUNTER — Ambulatory Visit
Admission: RE | Admit: 2021-03-03 | Discharge: 2021-03-03 | Disposition: A | Discharge status: Home / Self Care | Payer: Medicare Other | Attending: Urology | Admitting: Urology

## 2021-03-03 DIAGNOSIS — E1136 Type 2 diabetes mellitus with diabetic cataract: Secondary | ICD-10-CM | POA: Insufficient documentation

## 2021-03-03 DIAGNOSIS — N202 Calculus of kidney with calculus of ureter: Secondary | ICD-10-CM | POA: Insufficient documentation

## 2021-03-03 DIAGNOSIS — Z01810 Encounter for preprocedural cardiovascular examination: Secondary | ICD-10-CM

## 2021-03-03 DIAGNOSIS — Z87891 Personal history of nicotine dependence: Secondary | ICD-10-CM | POA: Insufficient documentation

## 2021-03-03 DIAGNOSIS — N201 Calculus of ureter: Secondary | ICD-10-CM

## 2021-03-03 HISTORY — PX: CYSTOSCOPY/URETEROSCOPY/HOLMIUM LASER/STENT PLACEMENT: SHX6546

## 2021-03-03 LAB — GLUCOSE, CAPILLARY
Glucose-Capillary: 115 mg/dL — ABNORMAL HIGH (ref 70–99)
Glucose-Capillary: 99 mg/dL (ref 70–99)

## 2021-03-03 SURGERY — CYSTOSCOPY/URETEROSCOPY/HOLMIUM LASER/STENT PLACEMENT
Anesthesia: General | Laterality: Right

## 2021-03-03 MED ORDER — BELLADONNA ALKALOIDS-OPIUM 16.2-60 MG RE SUPP
RECTAL | Status: DC | PRN
  Administered 2021-03-03: 1 via RECTAL

## 2021-03-03 MED ORDER — SUGAMMADEX SODIUM 200 MG/2ML IV SOLN
INTRAVENOUS | Status: DC | PRN
  Administered 2021-03-03: 500 mg via INTRAVENOUS

## 2021-03-03 MED ORDER — FENTANYL CITRATE (PF) 100 MCG/2ML IJ SOLN
INTRAMUSCULAR | Status: AC
  Filled 2021-03-03: qty 2

## 2021-03-03 MED ORDER — CHLORHEXIDINE GLUCONATE 0.12 % MT SOLN
15.0000 mL | Freq: Once | OROMUCOSAL | Status: AC
  Administered 2021-03-03: 15 mL via OROMUCOSAL

## 2021-03-03 MED ORDER — FENTANYL CITRATE (PF) 100 MCG/2ML IJ SOLN: 25.0000 ug | INTRAMUSCULAR | Status: DC | PRN

## 2021-03-03 MED ORDER — PHENYLEPHRINE HCL (PRESSORS) 10 MG/ML IV SOLN
INTRAVENOUS | Status: DC | PRN
  Administered 2021-03-03: 50 ug via INTRAVENOUS
  Administered 2021-03-03: 100 ug via INTRAVENOUS

## 2021-03-03 MED ORDER — KETOROLAC TROMETHAMINE 30 MG/ML IJ SOLN
INTRAMUSCULAR | Status: AC
  Filled 2021-03-03: qty 1

## 2021-03-03 MED ORDER — BELLADONNA ALKALOIDS-OPIUM 16.2-60 MG RE SUPP
RECTAL | Status: AC
  Filled 2021-03-03: qty 1

## 2021-03-03 MED ORDER — CEPHALEXIN 500 MG PO CAPS: 500.0000 mg | ORAL_CAPSULE | Freq: Two times a day (BID) | ORAL | 0 refills | Status: DC

## 2021-03-03 MED ORDER — TAMSULOSIN HCL 0.4 MG PO CAPS: 0.4000 mg | ORAL_CAPSULE | Freq: Every day | ORAL | 0 refills | Status: DC

## 2021-03-03 MED ORDER — PROPOFOL 10 MG/ML IV BOLUS
INTRAVENOUS | Status: AC
  Filled 2021-03-03: qty 20

## 2021-03-03 MED ORDER — SODIUM CHLORIDE 0.9 % IR SOLN
Status: DC | PRN
  Administered 2021-03-03: 1500 mL

## 2021-03-03 MED ORDER — ACETAMINOPHEN 10 MG/ML IV SOLN
INTRAVENOUS | Status: AC
  Filled 2021-03-03: qty 100

## 2021-03-03 MED ORDER — OXYCODONE-ACETAMINOPHEN 5-325 MG PO TABS: 1.0000 | ORAL_TABLET | Freq: Four times a day (QID) | ORAL | 0 refills | Status: AC | PRN

## 2021-03-03 MED ORDER — ROCURONIUM BROMIDE 10 MG/ML (PF) SYRINGE
PREFILLED_SYRINGE | INTRAVENOUS | Status: AC
  Filled 2021-03-03: qty 10

## 2021-03-03 MED ORDER — ROCURONIUM BROMIDE 100 MG/10ML IV SOLN
INTRAVENOUS | Status: DC | PRN
  Administered 2021-03-03: 50 mg via INTRAVENOUS

## 2021-03-03 MED ORDER — MEPERIDINE HCL 25 MG/ML IJ SOLN: 6.2500 mg | INTRAMUSCULAR | Status: DC | PRN

## 2021-03-03 MED ORDER — LIDOCAINE HCL (PF) 2 % IJ SOLN
INTRAMUSCULAR | Status: AC
  Filled 2021-03-03: qty 5

## 2021-03-03 MED ORDER — ONDANSETRON HCL 4 MG/2ML IJ SOLN
INTRAMUSCULAR | Status: AC
  Filled 2021-03-03: qty 2

## 2021-03-03 MED ORDER — PROPOFOL 10 MG/ML IV BOLUS
INTRAVENOUS | Status: DC | PRN
  Administered 2021-03-03: 160 mg via INTRAVENOUS

## 2021-03-03 MED ORDER — LIDOCAINE HCL (CARDIAC) PF 100 MG/5ML IV SOSY
PREFILLED_SYRINGE | INTRAVENOUS | Status: DC | PRN
  Administered 2021-03-03: 100 mg via INTRAVENOUS

## 2021-03-03 MED ORDER — CEFAZOLIN SODIUM-DEXTROSE 2-4 GM/100ML-% IV SOLN
INTRAVENOUS | Status: AC
  Filled 2021-03-03: qty 100

## 2021-03-03 MED ORDER — ALBUTEROL SULFATE HFA 108 (90 BASE) MCG/ACT IN AERS
INHALATION_SPRAY | RESPIRATORY_TRACT | Status: DC | PRN
  Administered 2021-03-03: 4 via RESPIRATORY_TRACT

## 2021-03-03 MED ORDER — MIDAZOLAM HCL 2 MG/2ML IJ SOLN
INTRAMUSCULAR | Status: AC
  Filled 2021-03-03: qty 2

## 2021-03-03 MED ORDER — DEXAMETHASONE SODIUM PHOSPHATE 10 MG/ML IJ SOLN
INTRAMUSCULAR | Status: AC
  Filled 2021-03-03: qty 1

## 2021-03-03 MED ORDER — KETOROLAC TROMETHAMINE 30 MG/ML IJ SOLN
INTRAMUSCULAR | Status: DC | PRN
  Administered 2021-03-03: 15 mg via INTRAVENOUS

## 2021-03-03 MED ORDER — MIDAZOLAM HCL 2 MG/2ML IJ SOLN
INTRAMUSCULAR | Status: DC | PRN
  Administered 2021-03-03: 1 mg via INTRAVENOUS

## 2021-03-03 MED ORDER — IOHEXOL 180 MG/ML  SOLN
INTRAMUSCULAR | Status: DC | PRN
  Administered 2021-03-03: 5 mL

## 2021-03-03 MED ORDER — FENTANYL CITRATE (PF) 100 MCG/2ML IJ SOLN
INTRAMUSCULAR | Status: DC | PRN
  Administered 2021-03-03: 50 ug via INTRAVENOUS
  Administered 2021-03-03: 25 ug via INTRAVENOUS

## 2021-03-03 MED ORDER — ORAL CARE MOUTH RINSE: 15.0000 mL | Freq: Once | OROMUCOSAL | Status: AC

## 2021-03-03 MED ORDER — SUGAMMADEX SODIUM 500 MG/5ML IV SOLN
INTRAVENOUS | Status: AC
  Filled 2021-03-03: qty 5

## 2021-03-03 MED ORDER — CHLORHEXIDINE GLUCONATE 0.12 % MT SOLN
OROMUCOSAL | Status: AC
  Filled 2021-03-03: qty 15

## 2021-03-03 MED ORDER — SODIUM CHLORIDE 0.9 % IV SOLN: INTRAVENOUS | Status: DC

## 2021-03-03 MED ORDER — ONDANSETRON HCL 4 MG/2ML IJ SOLN: 4.0000 mg | Freq: Once | INTRAMUSCULAR | Status: DC | PRN

## 2021-03-03 SURGICAL SUPPLY — 31 items
BAG DRAIN CYSTO-URO LG1000N (MISCELLANEOUS) ×2 IMPLANT
BRUSH SCRUB EZ 1% IODOPHOR (MISCELLANEOUS) ×2 IMPLANT
CATH URET FLEX-TIP 2 LUMEN 10F (CATHETERS) IMPLANT
CATH URETL OPEN 5X70 (CATHETERS) IMPLANT
CNTNR SPEC 2.5X3XGRAD LEK (MISCELLANEOUS)
CONT SPEC 4OZ STER OR WHT (MISCELLANEOUS)
CONT SPEC 4OZ STRL OR WHT (MISCELLANEOUS)
CONTAINER SPEC 2.5X3XGRAD LEK (MISCELLANEOUS) IMPLANT
DRAPE UTILITY 15X26 TOWEL STRL (DRAPES) ×2 IMPLANT
DRSG TEGADERM 2-3/8X2-3/4 SM (GAUZE/BANDAGES/DRESSINGS) ×1 IMPLANT
GAUZE 4X4 16PLY ~~LOC~~+RFID DBL (SPONGE) ×3 IMPLANT
GLOVE SURG UNDER POLY LF SZ7.5 (GLOVE) ×3 IMPLANT
GOWN STRL REUS W/ TWL LRG LVL3 (GOWN DISPOSABLE) ×1 IMPLANT
GOWN STRL REUS W/ TWL XL LVL3 (GOWN DISPOSABLE) ×1 IMPLANT
GOWN STRL REUS W/TWL LRG LVL3 (GOWN DISPOSABLE) ×2
GOWN STRL REUS W/TWL XL LVL3 (GOWN DISPOSABLE) ×2
GUIDEWIRE STR DUAL SENSOR (WIRE) ×3 IMPLANT
INFUSOR MANOMETER BAG 3000ML (MISCELLANEOUS) ×2 IMPLANT
IV NS IRRIG 3000ML ARTHROMATIC (IV SOLUTION) ×2 IMPLANT
KIT TURNOVER CYSTO (KITS) ×2 IMPLANT
PACK CYSTO AR (MISCELLANEOUS) ×2 IMPLANT
SET CYSTO W/LG BORE CLAMP LF (SET/KITS/TRAYS/PACK) ×2 IMPLANT
SHEATH URETERAL 12FRX35CM (MISCELLANEOUS) IMPLANT
STENT URET 6FRX24 CONTOUR (STENTS) IMPLANT
STENT URET 6FRX26 CONTOUR (STENTS) ×1 IMPLANT
SURGILUBE 2OZ TUBE FLIPTOP (MISCELLANEOUS) ×2 IMPLANT
SYR 10ML LL (SYRINGE) ×2 IMPLANT
TRACTIP FLEXIVA PULSE ID 200 (Laser) ×1 IMPLANT
VALVE UROSEAL ADJ ENDO (VALVE) ×1 IMPLANT
WATER STERILE IRR 1000ML POUR (IV SOLUTION) ×2 IMPLANT
WATER STERILE IRR 500ML POUR (IV SOLUTION) ×2 IMPLANT

## 2021-03-03 NOTE — Progress Notes (Signed)
Anesthesia contacted,  Dr. Erenest Rasher about elevated blood pressure, no new orders. States it is within 20% of blood pressure on arrival. Pt/family verbalizes understanding. Encouraged patient to notify primary care physician.

## 2021-03-03 NOTE — Transfer of Care (Signed)
Immediate Anesthesia Transfer of Care Note  Patient: Melvin Brewer  Procedure(s) Performed: CYSTOSCOPY/URETEROSCOPY/HOLMIUM LASER/STENT PLACEMENT (Right)  Patient Location: PACU  Anesthesia Type:General  Level of Consciousness: drowsy and patient cooperative  Airway & Oxygen Therapy: Patient Spontanous Breathing  Post-op Assessment: Report given to RN and Post -op Vital signs reviewed and stable  Post vital signs: Reviewed and stable  Last Vitals:  Vitals Value Taken Time  BP    Temp    Pulse 73 03/03/21 1252  Resp 17 03/03/21 1252  SpO2 97 % 03/03/21 1252  Vitals shown include unvalidated device data.  Last Pain:  Vitals:   03/03/21 1028  TempSrc: Oral  PainSc: 0-No pain         Complications: No notable events documented.

## 2021-03-03 NOTE — Interval H&P Note (Signed)
UROLOGY H&P UPDATE  Agree with prior H&P dated 03/02/2021, 6 mm right distal ureteral stone and ongoing renal colic, urine culture no growth  Cardiac: RRR Lungs: CTA bilaterally  Laterality: Right Procedure: Right ureteroscopy Laser lithotripsy, stent placement  Urine: No growth  We specifically discussed the risks ureteroscopy including bleeding, infection/sepsis, stent related symptoms including flank pain/urgency/frequency/incontinence/dysuria, ureteral injury, inability to access stone, or need for staged or additional procedures.   Billey Co, MD 03/03/2021

## 2021-03-03 NOTE — Anesthesia Procedure Notes (Signed)
Procedure Name: Intubation Date/Time: 03/03/2021 11:59 AM Performed by: Lia Foyer, CRNA Pre-anesthesia Checklist: Patient identified, Emergency Drugs available, Suction available and Patient being monitored Patient Re-evaluated:Patient Re-evaluated prior to induction Oxygen Delivery Method: Circle system utilized Preoxygenation: Pre-oxygenation with 100% oxygen Induction Type: IV induction Ventilation: Mask ventilation without difficulty Laryngoscope Size: McGraph and 4 Grade View: Grade I Tube type: Oral Tube size: 7.5 mm Number of attempts: 1 Airway Equipment and Method: Stylet and Video-laryngoscopy Placement Confirmation: ETT inserted through vocal cords under direct vision, positive ETCO2 and breath sounds checked- equal and bilateral Secured at: 22 cm Tube secured with: Tape Dental Injury: Teeth and Oropharynx as per pre-operative assessment

## 2021-03-03 NOTE — Anesthesia Preprocedure Evaluation (Signed)
Anesthesia Evaluation  Patient identified by MRN, date of birth, ID band Patient awake    Reviewed: NPO status , Patient's Chart, lab work & pertinent test results, reviewed documented beta blocker date and time   Airway Mallampati: II  TM Distance: >3 FB Neck ROM: full    Dental  (+) Upper Dentures, Lower Dentures, Missing, Poor Dentition   Pulmonary shortness of breath and with exertion, COPD (COPD - on symbicort, spriva and albuterol. ),  COPD inhaler, former smoker,    Pulmonary exam normal breath sounds clear to auscultation       Cardiovascular Exercise Tolerance: Good hypertension, Pt. on medications and Pt. on home beta blockers + angina with exertion + CAD, + CABG (x2 2007 and 2020;), + Peripheral Vascular Disease (AAA - had an aortic stent by Dr Arita Miss.) and +CHF  Normal cardiovascular exam Rhythm:Regular     Neuro/Psych negative neurological ROS  negative psych ROS   GI/Hepatic Neg liver ROS, hiatal hernia, GERD  Controlled,  Endo/Other  diabetes, Well Controlled, Oral Hypoglycemic AgentsMorbid obesity (bmi 29)  Renal/GU   negative genitourinary   Musculoskeletal  (+) Arthritis ,   Abdominal   Peds  Hematology negative hematology ROS (+)   Anesthesia Other Findings AAA (abdominal aortic aneurysm)    Abdominal aortic aneurysm (AAA)    Allergic rhinitis    Anginal pain (HCC)    Centrilobular emphysema (HCC)    Cervical spine degeneration    CHF (congestive heart failure) ([place])   COPD   Coronary artery disease    Diabetes mellitus without complication ([place])  Diverticulitis    DJD (degenerative joint disease)    Early cataract    Eczema    GERD (gastroesophageal reflux disease)   H/O hiatal hernia  AGE 70 S NO MEDS  Hemorrhoids    Herpes simplex infection    Hypercholesterolemia    Hyperglycemia    Hyperlipidemia    Hypertension    Macular degeneration  right eye  Obesity    Shortness of  breath  W/ EXERTION   Wears glasses    Wears partial dentures    10/04/18 ECHO Left Ventricle: The left ventricle has low normal systolic function, with  an ejection fraction of 50-55%.   Reproductive/Obstetrics                            Anesthesia Physical  Anesthesia Plan  ASA: 3  Anesthesia Plan: General   Post-op Pain Management:    Induction: Intravenous  PONV Risk Score and Plan: 2 and Midazolam, Propofol infusion and Ondansetron  Airway Management Planned: Oral ETT  Additional Equipment:   Intra-op Plan:   Post-operative Plan: Extubation in OR  Informed Consent: I have reviewed the patients History and Physical, chart, labs and discussed the procedure including the risks, benefits and alternatives for the proposed anesthesia with the patient or authorized representative who has indicated his/her understanding and acceptance.       Plan Discussed with: CRNA, Anesthesiologist and Surgeon  Anesthesia Plan Comments:        Anesthesia Quick Evaluation

## 2021-03-03 NOTE — Discharge Instructions (Signed)
AMBULATORY SURGERY  ?DISCHARGE INSTRUCTIONS ? ? ?The drugs that you were given will stay in your system until tomorrow so for the next 24 hours you should not: ? ?Drive an automobile ?Make any legal decisions ?Drink any alcoholic beverage ? ? ?You may resume regular meals tomorrow.  Today it is better to start with liquids and gradually work up to solid foods. ? ?You may eat anything you prefer, but it is better to start with liquids, then soup and crackers, and gradually work up to solid foods. ? ? ?Please notify your doctor immediately if you have any unusual bleeding, trouble breathing, redness and pain at the surgery site, drainage, fever, or pain not relieved by medication. ? ? ? ?Additional Instructions: ? ? ? ?Please contact your physician with any problems or Same Day Surgery at [phone], Monday through Friday 6 am to 4 pm, or [place] [facility] number at [phone].  ?

## 2021-03-03 NOTE — Op Note (Signed)
Date of procedure: 03/03/21  Preoperative diagnosis:  Right distal ureteral stone Right renal stone  Postoperative diagnosis:  Same  Procedure: Cystoscopy, right retrograde pyelogram with intraoperative interpretation, right ureteral stent placement Right ureteroscopy, laser lithotripsy of right distal ureteral stone Right ureteroscopy, laser lithotripsy of right renal stone  Surgeon: Nickolas Madrid, MD  Anesthesia: General  Complications: None  Intraoperative findings:  Moderate size prostate, mild bladder trabeculations, right distal ureteral stone and right renal stone dusted and fragments irrigated free Uncomplicated stent placement  EBL: Minimal  Specimens: None  Drains: Right 6 French by 26 cm ureteral stent  Indication: Melvin Brewer is a 70 y.o. patient with 7 mm distal ureteral stone and poorly controlled renal colic.  After reviewing the management options for treatment, they elected to proceed with the above surgical procedure(s). We have discussed the potential benefits and risks of the procedure, side effects of the proposed treatment, the likelihood of the patient achieving the goals of the procedure, and any potential problems that might occur during the procedure or recuperation. Informed consent has been obtained.  Description of procedure:  The patient was taken to the operating room and general anesthesia was induced. SCDs were placed for DVT prophylaxis. The patient was placed in the dorsal lithotomy position, prepped and draped in the usual sterile fashion, and preoperative antibiotics(Ancef) were administered. A preoperative time-out was performed.   A 21 French rigid cystoscope was used to intubate the urethra and thorough cystoscopy was performed.  The prostate was moderate in size and there were mild bladder trabeculations.  No suspicious bladder lesions.  The right ureteral orifice was edematous.  I was able to intubate the right ureteral orifice with  a sensor wire and this was advanced alongside the stone up to the kidney under fluoroscopic vision.  The semirigid ureteroscope was advanced alongside the wire and identified a yellow stone in the distal ureter and this was fragmented to dust with the 242 m laser fiber on settings of 0.3 J and 80 Hz.  All pieces were irrigated free from the ureter.  A second safety wire was added through the semirigid ureteroscope and advanced up to the kidney under fluoroscopic vision.  A single channel digital flexible ureteroscope then advanced easily over the wire up to the kidney under fluoroscopic vision.  Thorough pyeloscopy revealed a 2 mm stone in the lower pole and this was fragmented to dust.    I retrograde pyelograms performed from the proximal ureter and showed no extravasation or filling defects.  Careful pullback ureteroscopy showed no residual stones or injury.  The rigid cystoscope was backloaded over the wire and a 6 [place] by 26 cm ureteral stent was uneventfully placed with a curl in the renal pelvis under fluoroscopy, as well as under direct vision the bladder.  Urine was seen to drain through the side ports of the stent under direct vision.  The bladder was drained, belladonna suppository was placed, and this concluded our procedure.  Disposition: Stable to PACU  Plan: Stent removal in clinic in 1 week  Nickolas Madrid, MD

## 2021-03-04 NOTE — Anesthesia Postprocedure Evaluation (Signed)
Anesthesia Post Note  Patient: Melvin Brewer  Procedure(s) Performed: CYSTOSCOPY/URETEROSCOPY/HOLMIUM LASER/STENT PLACEMENT (Right)  Patient location during evaluation: PACU Anesthesia Type: General Level of consciousness: awake and alert, awake and oriented Pain management: pain level controlled Vital Signs Assessment: post-procedure vital signs reviewed and stable Respiratory status: spontaneous breathing, nonlabored ventilation and respiratory function stable Cardiovascular status: blood pressure returned to baseline and stable Postop Assessment: no apparent nausea or vomiting Anesthetic complications: no   No notable events documented.   Last Vitals:  Vitals:   03/03/21 1324 03/03/21 1338  BP:  (!) 166/82  Pulse: 69   Resp: 16   Temp: (!) 36.4 C   SpO2:      Last Pain:  Vitals:   03/03/21 1324  TempSrc: Temporal  PainSc: 0-No pain                 Phill Mutter

## 2021-03-05 ENCOUNTER — Encounter: Payer: Self-pay | Admitting: Urology

## 2021-03-10 ENCOUNTER — Ambulatory Visit (INDEPENDENT_AMBULATORY_CARE_PROVIDER_SITE_OTHER): Payer: Medicare Other | Admitting: Urology

## 2021-03-10 ENCOUNTER — Other Ambulatory Visit: Payer: Self-pay

## 2021-03-10 ENCOUNTER — Encounter: Payer: Self-pay | Admitting: Urology

## 2021-03-10 DIAGNOSIS — N200 Calculus of kidney: Secondary | ICD-10-CM

## 2021-03-10 LAB — MICROSCOPIC EXAMINATION: WBC, UA: >30 /hpf — AB (ref 0–5)

## 2021-03-10 LAB — URINALYSIS, COMPLETE
Bilirubin, UA: NEGATIVE
Glucose, UA: NEGATIVE
Ketones, UA: NEGATIVE
Nitrite, UA: NEGATIVE
Specific Gravity, UA: 1.02 (ref 1.005–1.030)
Urobilinogen, Ur: 0.2 mg/dL (ref 0.2–1.0)
pH, UA: 5.5 (ref 5.0–7.5)

## 2021-03-10 NOTE — Progress Notes (Signed)
Cystoscopy Procedure Note:  Indication: Stent removal s/p 02/24/2021 right ureteroscopy, laser lithotripsy for 6 mm mid ureteral stone  After informed consent and discussion of the procedure and its risks, Melvin Brewer was positioned and prepped in the standard fashion. Cystoscopy was performed with a flexible cystoscope. The stent was grasped with flexible graspers and removed in its entirety. The patient tolerated the procedure well.  Findings: Uncomplicated stent removal  Assessment and Plan: We discussed general stone prevention strategies including adequate hydration with goal of producing 2.5 L of urine daily, increasing citric acid intake, increasing calcium intake during high oxalate meals, minimizing animal protein, and decreasing salt intake. Information about dietary recommendations given today.     Billey Co, MD 03/10/2021

## 2021-03-10 NOTE — Patient Instructions (Signed)
Dietary Guidelines to Help Prevent Kidney Stones Kidney stones are deposits of minerals and salts that form inside your kidneys. Your risk of developing kidney stones may be greater depending on your diet, your lifestyle, the medicines you take, and whether you have certain medical conditions. Most people can lower their chances of developing kidney stones by following the instructions below. Your dietitian may give you more specific instructions depending on your overall health and the type of kidney stones you tend to develop. What are tips for following this plan? Reading food labels  Choose foods with "no salt added" or "low-salt" labels. Limit your salt (sodium) intake to less than 1,500 mg a day. Choose foods with calcium for each meal and snack. Try to eat about 300 mg of calcium at each meal. Foods that contain 200-500 mg of calcium a serving include: 8 oz (237 mL) of milk, calcium-fortifiednon-dairy milk, and calcium-fortifiedfruit juice. Calcium-fortified means that calcium has been added to these drinks. 8 oz (237 mL) of kefir, yogurt, and soy yogurt. 4 oz (114 g) of tofu. 1 oz (28 g) of cheese. 1 cup (150 g) of dried figs. 1 cup (91 g) of cooked broccoli. One 3 oz (85 g) can of sardines or mackerel. Most people need 1,000-1,500 mg of calcium a day. Talk to your dietitian about how much calcium is recommended for you. Shopping Buy plenty of fresh fruits and vegetables. Most people do not need to avoid fruits and vegetables, even if these foods contain nutrients that may contribute to kidney stones. When shopping for convenience foods, choose: Whole pieces of fruit. Pre-made salads with dressing on the side. Low-fat fruit and yogurt smoothies. Avoid buying frozen meals or prepared deli foods. These can be high in sodium. Look for foods with live cultures, such as yogurt and kefir. Choose high-fiber grains, such as whole-wheat breads, oat bran, and wheat cereals. Cooking Do not add  salt to food when cooking. Place a salt shaker on the table and allow each person to add his or her own salt to taste. Use vegetable protein, such as beans, textured vegetable protein (TVP), or tofu, instead of meat in pasta, casseroles, and soups. Meal planning Eat less salt, if told by your dietitian. To do this: Avoid eating processed or pre-made food. Avoid eating fast food. Eat less animal protein, including cheese, meat, poultry, or fish, if told by your dietitian. To do this: Limit the number of times you have meat, poultry, fish, or cheese each week. Eat a diet free of meat at least 2 days a week. Eat only one serving each day of meat, poultry, fish, or seafood. When you prepare animal protein, cut pieces into small portion sizes. For most meat and fish, one serving is about the size of the palm of your hand. Eat at least five servings of fresh fruits and vegetables each day. To do this: Keep fruits and vegetables on hand for snacks. Eat one piece of fruit or a handful of berries with breakfast. Have a salad and fruit at lunch. Have two kinds of vegetables at dinner. Limit foods that are high in a substance called oxalate. These include: Spinach (cooked), rhubarb, beets, sweet potatoes, and Swiss chard. Peanuts. Potato chips, french fries, and baked potatoes with skin on. Nuts and nut products. Chocolate. If you regularly take a diuretic medicine, make sure to eat at least 1 or 2 servings of fruits or vegetables that are high in potassium each day. These include: Avocado. Banana. Orange, prune,   carrot, or tomato juice. Baked potato. Cabbage. Beans and split peas. Lifestyle  Drink enough fluid to keep your urine pale yellow. This is the most important thing you can do. Spread your fluid intake throughout the day. If you drink alcohol: Limit how much you use to: 0-1 drink a day for women who are not pregnant. 0-2 drinks a day for men. Be aware of how much alcohol is in your  drink. In the [place] one drink equals one 12 oz bottle of beer (355 mL), one 5 oz glass of wine (148 mL), or one 1 oz glass of hard liquor (44 mL). Lose weight if told by your health care provider. Work with your dietitian to find an eating plan and weight loss strategies that work best for you. General information Talk to your health care provider and dietitian about taking daily supplements. You may be told the following depending on your health and the cause of your kidney stones: Not to take supplements with vitamin C. To take a calcium supplement. To take a daily probiotic supplement. To take other supplements such as magnesium, fish oil, or vitamin B6. Take over-the-counter and prescription medicines only as told by your health care provider. These include supplements. What foods should I limit? Limit your intake of the following foods, or eat them as told by your dietitian. Vegetables Spinach. Rhubarb. Beets. Canned vegetables. Pickles. Olives. Baked potatoes with skin. Grains Wheat bran. Baked goods. Salted crackers. Cereals high in sugar. Meats and other proteins Nuts. Nut butters. Large portions of meat, poultry, or fish. Salted, precooked, or cured meats, such as sausages, meat loaves, and hot dogs. Dairy Cheese. Beverages Regular soft drinks. Regular vegetable juice. Seasonings and condiments Seasoning blends with salt. Salad dressings. Soy sauce. Ketchup. Barbecue sauce. Other foods Canned soups. Canned pasta sauce. Casseroles. Pizza. Lasagna. Frozen meals. Potato chips. French fries. The items listed above may not be a complete list of foods and beverages you should limit. Contact a dietitian for more information. What foods should I avoid? Talk to your dietitian about specific foods you should avoid based on the type of kidney stones you have and your overall health. Fruits Grapefruit. The item listed above may not be a complete list of foods and beverages you should  avoid. Contact a dietitian for more information. Summary Kidney stones are deposits of minerals and salts that form inside your kidneys. You can lower your risk of kidney stones by making changes to your diet. The most important thing you can do is drink enough fluid. Drink enough fluid to keep your urine pale yellow. Talk to your dietitian about how much calcium you should have each day, and eat less salt and animal protein as told by your dietitian. This information is not intended to replace advice given to you by your health care provider. Make sure you discuss any questions you have with your health care provider. Document Revised: 03/21/2019 Document Reviewed: 03/21/2019 Elsevier Patient Education  2022 Elsevier Inc.  

## 2021-04-14 ENCOUNTER — Encounter: Payer: Self-pay | Admitting: Urology

## 2021-04-14 ENCOUNTER — Ambulatory Visit (INDEPENDENT_AMBULATORY_CARE_PROVIDER_SITE_OTHER): Payer: Medicare Other | Admitting: Urology

## 2021-04-14 ENCOUNTER — Other Ambulatory Visit: Payer: Self-pay

## 2021-04-14 VITALS — BP 172/75 | HR 73 | Ht 69.0 in | Wt 189.0 lb

## 2021-04-14 DIAGNOSIS — N529 Male erectile dysfunction, unspecified: Secondary | ICD-10-CM

## 2021-04-14 DIAGNOSIS — R300 Dysuria: Secondary | ICD-10-CM | POA: Diagnosis not present

## 2021-04-14 DIAGNOSIS — R399 Unspecified symptoms and signs involving the genitourinary system: Secondary | ICD-10-CM | POA: Diagnosis not present

## 2021-04-14 DIAGNOSIS — R3989 Other symptoms and signs involving the genitourinary system: Secondary | ICD-10-CM

## 2021-04-14 LAB — URINALYSIS, COMPLETE
Bilirubin, UA: NEGATIVE
Nitrite, UA: NEGATIVE
Specific Gravity, UA: >1.03 — ABNORMAL HIGH (ref 1.005–1.030)
Urobilinogen, Ur: 0.2 mg/dL (ref 0.2–1.0)
pH, UA: 5.5 (ref 5.0–7.5)

## 2021-04-14 LAB — BLADDER SCAN AMB NON-IMAGING

## 2021-04-14 LAB — MICROSCOPIC EXAMINATION
Epithelial Cells (non renal): NONE SEEN /hpf (ref 0–10)
WBC, UA: >30 /hpf — ABNORMAL HIGH (ref 0–5)

## 2021-04-14 MED ORDER — CEPHALEXIN 500 MG PO CAPS: 500.0000 mg | ORAL_CAPSULE | Freq: Two times a day (BID) | ORAL | 0 refills | Status: DC

## 2021-04-14 MED ORDER — TADALAFIL 10 MG PO TABS: 10.0000 mg | ORAL_TABLET | Freq: Every day | ORAL | 11 refills | Status: DC

## 2021-04-14 NOTE — Patient Instructions (Signed)
Erectile Dysfunction °Erectile dysfunction (ED) is the inability to get or keep an erection in order to have sexual intercourse. ED is considered a symptom of an underlying disorder and is not considered a disease. ED may include: °Inability to get an erection. °Lack of enough hardness of the erection to allow penetration. °Loss of erection before sex is finished. °What are the causes? °This condition may be caused by: °Physical causes, such as: °Artery problems. This may include heart disease, high blood pressure, atherosclerosis, and diabetes. °Hormonal problems, such as low testosterone. °Obesity. °Nerve problems. This may include back or pelvic injuries, multiple sclerosis, Parkinson's disease, spinal cord injury, and stroke. °Certain medicines, such as: °Pain relievers. °Antidepressants. °Blood pressure medicines and water pills (diuretics). °Cancer medicines. °Antihistamines. °Muscle relaxants. °Lifestyle factors, such as: °Use of drugs such as marijuana, cocaine, or opioids. °Excessive use of alcohol. °Smoking. °Lack of physical activity or exercise. °Psychological causes, such as: °Anxiety or stress. °Sadness or depression. °Exhaustion. °Fear about sexual performance. °Guilt. °What are the signs or symptoms? °Symptoms of this condition include: °Inability to get an erection. °Lack of enough hardness of the erection to allow penetration. °Loss of the erection before sex is finished. °Sometimes having normal erections, but with frequent unsatisfactory episodes. °Low sexual satisfaction in either partner due to erection problems. °A curved penis occurring with erection. The curve may cause pain, or the penis may be too curved to allow for intercourse. °Never having nighttime or morning erections. °How is this diagnosed? °This condition is often diagnosed by: °Performing a physical exam to find other diseases or specific problems with the penis. °Asking you detailed questions about the problem. °Doing tests,  such as: °Blood tests to check for diabetes mellitus or high cholesterol, or to measure hormone levels. °Other tests to check for underlying health conditions. °An ultrasound exam to check for scarring. °A test to check blood flow to the penis. °Doing a sleep study at home to measure nighttime erections. °How is this treated? °This condition may be treated by: °Medicines, such as: °Medicine taken by mouth to help you achieve an erection (oral medicine). °Hormone replacement therapy to replace low testosterone levels. °Medicine that is injected into the penis. Your health care provider may instruct you how to give yourself these injections at home. °Medicine that is delivered with a short applicator tube. The tube is inserted into the opening at the tip of the penis, which is the opening of the urethra. A tiny pellet of medicine is put in the urethra. The pellet dissolves and enhances erectile function. This is also called MUSE (medicated urethral system for erections) therapy. °Vacuum pump. This is a pump with a ring on it. The pump and ring are placed on the penis and used to create pressure that helps the penis become erect. °Penile implant surgery. In this procedure, you may receive: °An inflatable implant. This consists of cylinders, a pump, and a reservoir. The cylinders can be inflated with a fluid that helps to create an erection, and they can be deflated after intercourse. °A semi-rigid implant. This consists of two silicone rubber rods. The rods provide some rigidity. They are also flexible, so the penis can both curve downward in its normal position and become straight for sexual intercourse. °Blood vessel surgery to improve blood flow to the penis. During this procedure, a blood vessel from a different part of the body is placed into the penis to allow blood to flow around (bypass) damaged or blocked blood vessels. °Lifestyle changes,   such as exercising more, losing weight, and quitting smoking. Follow  these instructions at home: Medicines  Take over-the-counter and prescription medicines only as told by your health care provider. Do not increase the dosage without first discussing it with your health care provider. If you are using self-injections, do injections as directed by your health care provider. Make sure you avoid any veins that are on the surface of the penis. After giving an injection, apply pressure to the injection site for 5 minutes. Talk to your health care provider about how to prevent headaches while taking ED medicines. These medicines may cause a sudden headache due to the increase in blood flow in your body. General instructions Exercise regularly, as directed by your health care provider. Work with your health care provider to lose weight, if needed. Do not use any products that contain nicotine or tobacco. These products include cigarettes, chewing tobacco, and vaping devices, such as e-cigarettes. If you need help quitting, ask your health care provider. Before using a vacuum pump, read the instructions that come with the pump and discuss any questions with your health care provider. Keep all follow-up visits. This is important. Contact a health care provider if: You feel nauseous. You are vomiting. You get sudden headaches while taking ED medicines. You have any concerns about your sexual health. Get help right away if: You are taking oral or injectable medicines and you have an erection that lasts longer than 4 hours. If your health care provider is unavailable, go to the nearest emergency room for evaluation. An erection that lasts much longer than 4 hours can result in permanent damage to your penis. You have severe pain in your groin or abdomen. You develop redness or severe swelling of your penis. You have redness spreading at your groin or lower abdomen. You are unable to urinate. You experience chest pain or a rapid heartbeat (palpitations) after taking oral  medicines. These symptoms may represent a serious problem that is an emergency. Do not wait to see if the symptoms will go away. Get medical help right away. Call your local emergency services (911 in the [place] Do not drive yourself to the hospital. Summary Erectile dysfunction (ED) is the inability to get or keep an erection during sexual intercourse. This condition is diagnosed based on a physical exam, your symptoms, and tests to determine the cause. Treatment varies depending on the cause and may include medicines, hormone therapy, surgery, or a vacuum pump. You may need follow-up visits to make sure that you are using your medicines or devices correctly. Get help right away if you are taking or injecting medicines and you have an erection that lasts longer than 4 hours. This information is not intended to replace advice given to you by your health care provider. Make sure you discuss any questions you have with your health care provider. Document Revised: 06/24/2020 Document Reviewed: 06/24/2020 Elsevier Patient Education  [employer].   Tadalafil Tablets (Erectile Dysfunction, BPH) What is this medication? TADALAFIL (tah DA la fil) treats erectile dysfunction (ED). It works by increasing blood flow to the penis, which helps to maintain an erection. It may also be used to treat symptoms of an enlarged prostate (benign prostatic hyperplasia). This medicine may be used for other purposes; ask your health care provider or pharmacist if you have questions. COMMON BRAND NAME(S): Kathaleen Bury, Cialis What should I tell my care team before I take this medication? They need to know if you have any of  these conditions: Anatomical deformation of the penis, Peyronie's disease, or history of priapism (painful and prolonged erection) Bleeding disorders Eye or vision problems, including a rare inherited eye disease called retinitis pigmentosa Heart disease, angina, a history of heart attack,  irregular heart beats, or other heart problems High or low blood pressure History of blood diseases, like sickle cell anemia or leukemia History of stomach bleeding Kidney disease Liver disease Stroke An unusual or allergic reaction to tadalafil, other medications, foods, dyes, or preservatives Pregnant or trying to get pregnant Breast-feeding How should I use this medication? Take this medication by mouth with a glass of water. Follow the directions on the prescription label. You may take this medication with or without meals. When this medication is used for erection problems, your care team may prescribe it to be taken once daily or as needed. If you are taking the medication as needed, you may be able to have sexual activity 30 minutes after taking it and for up to 36 hours after taking it. Whether you are taking the medication as needed or once daily, you should not take more than one dose per day. If you are taking this medication for symptoms of benign prostatic hyperplasia (BPH) or to treat both BPH and an erection problem, take the dose once daily at about the same time each day. Do not take your medication more often than directed. Talk to your care team about the use of this medication in children. Special care may be needed. Overdosage: If you think you have taken too much of this medicine contact a poison control center or emergency room at once. NOTE: This medicine is only for you. Do not share this medicine with others. What if I miss a dose? If you are taking this medication as needed for erection problems, this does not apply. If you miss a dose while taking this medication once daily for an erection problem, benign prostatic hyperplasia, or both, take it as soon as you remember, but do not take more than one dose per day. What may interact with this medication? Do not take this medication with any of the following: Nitrates like amyl nitrite, isosorbide dinitrate, isosorbide  mononitrate, nitroglycerin Other medications for erectile dysfunction like avanafil, sildenafil, vardenafil Other tadalafil products (Adcirca) Riociguat This medication may also interact with the following: Certain medications for high blood pressure Certain medications for the treatment of HIV infection or AIDS Certain medications used for fungal or yeast infections, like fluconazole, itraconazole, ketoconazole, and voriconazole Certain medications used for seizures like carbamazepine, phenytoin, and phenobarbital Grapefruit juice Macrolide antibiotics like clarithromycin, erythromycin, troleandomycin Medications for prostate problems Rifabutin, rifampin or rifapentine This list may not describe all possible interactions. Give your health care provider a list of all the medicines, herbs, non-prescription drugs, or dietary supplements you use. Also tell them if you smoke, drink alcohol, or use illegal drugs. Some items may interact with your medicine. What should I watch for while using this medication? If you notice any changes in your vision while taking this medication, call your care team as soon as possible. Stop using this medication and call your care team right away if you have a loss of sight in one or both eyes. Contact your care team right away if the erection lasts longer than 4 hours or if it becomes painful. This may be a sign of serious problem and must be treated right away to prevent permanent damage. If you experience symptoms of nausea, dizziness, chest pain  or arm pain upon initiation of sexual activity after taking this medication, you should refrain from further activity and call your care team as soon as possible. Do not drink alcohol to excess (examples, 5 glasses of wine or 5 shots of whiskey) when taking this medication. When taken in excess, alcohol can increase your chances of getting a headache or getting dizzy, increasing your heart rate or lowering your blood  pressure. Using this medication does not protect you or your partner against HIV infection (the virus that causes AIDS) or other sexually transmitted diseases. What side effects may I notice from receiving this medication? Side effects that you should report to your care team as soon as possible: Allergic reactions--skin rash, itching, hives, swelling of the face, lips, tongue, or throat Hearing loss or ringing in ears Heart attack--pain or tightness in the chest, shoulders, arms, or jaw, nausea, shortness of breath, cold or clammy skin, feeling faint or lightheaded Low blood pressure--dizziness, feeling faint or lightheaded, blurry vision Prolonged or painful erection Redness, blistering, peeling, or loosening of the skin, including inside the mouth Stroke--sudden numbness or weakness of the face, arm, or leg, trouble speaking, confusion, trouble walking, loss of balance or coordination, dizziness, severe headache, change in vision Sudden vision loss in one or both eyes Side effects that usually do not require medical attention (report to your care team if they continue or are bothersome): Back pain Facial flushing or redness Headache Muscle pain Runny or stuffy nose Upset stomach This list may not describe all possible side effects. Call your doctor for medical advice about side effects. You may report side effects to FDA at 1-800-FDA-1088. Where should I keep my medication? Keep out of the reach of children. Store at room temperature between 15 and 30 degrees C (59 and 86 degrees F). Throw away any unused medication after the expiration date. NOTE: This sheet is a summary. It may not cover all possible information. If you have questions about this medicine, talk to your doctor, pharmacist, or health care provider.  2022 Elsevier/Gold Standard (2020-06-11 00:00:00)

## 2021-04-14 NOTE — Addendum Note (Signed)
Addended by: Donalee Citrin on: 04/14/2021 09:35 AM   Modules accepted: Orders

## 2021-04-14 NOTE — Progress Notes (Signed)
° °  04/14/2021 9:02 AM   Melvin Brewer 09-07-1950 [id number]  Reason for visit: Dysuria/urinary frequency, erectile dysfunction  HPI: 71 year old male who presents with about 2 weeks of urinary frequency and dysuria, as well as long-term erectile dysfunction interested in treatment options.  He denies any fever/chills/gross hematuria.  He previously tried sildenafil for erections without significant improvement.  He is able to achieve moderate erections, but has trouble with maintaining.  PVR is normal today at 25 mL.  On 03/03/2021 he underwent uncomplicated right ureteroscopy, laser lithotripsy, and stent placement for a 7 mm right distal ureteral stone and smaller right renal stone, and stent was subsequently removed in clinic on 03/10/2021.  Prostate measured 21 g on CT at that time and bladder was otherwise normal.  Urinalysis today suspicious for UTI with greater than 30 WBCs, 3-10 RBCs, few bacteria, no yeast, nitrite negative, 2+ leukocytes.  Will send for culture and atypicals.  I recommended Keflex 500 mg twice daily x7 days for suspected UTI, and will follow-up culture results.  Return precautions discussed.  Regarding his erectile dysfunction, I recommended a trial of Cialis 10 mg daily, with 10 mg boost as needed.  Risks and benefits discussed.  Keflex 500 mg twice daily x7 days for acute UTI, follow-up culture results/sensitivity Cialis 10 mg daily, 10 mg boost as needed for ED RTC 6 weeks symptom check  Billey Co, MD  [facility] [address] [place], [place] [address] [phone]

## 2021-04-19 LAB — CULTURE, URINE COMPREHENSIVE

## 2021-04-20 ENCOUNTER — Telehealth: Payer: Self-pay

## 2021-04-20 DIAGNOSIS — N390 Urinary tract infection, site not specified: Secondary | ICD-10-CM

## 2021-04-20 LAB — MYCOPLASMA / UREAPLASMA CULTURE
Mycoplasma hominis Culture: NEGATIVE
Ureaplasma urealyticum: NEGATIVE

## 2021-04-20 MED ORDER — CIPROFLOXACIN HCL 500 MG PO TABS: 500.0000 mg | ORAL_TABLET | Freq: Two times a day (BID) | ORAL | 0 refills | Status: DC

## 2021-04-20 NOTE — Telephone Encounter (Signed)
Called pt informed him of the information below. Pt voiced understanding. RX sent.  °

## 2021-04-20 NOTE — Telephone Encounter (Signed)
-----   Message from Billey Co, MD sent at 04/20/2021  8:22 AM EST ----- He grew Enterococcus which would not be sensitive to the Keflex, please change to Cipro 500 mg twice daily x7 days, thanks  Nickolas Madrid, MD 04/20/2021

## 2021-05-27 ENCOUNTER — Encounter: Payer: Self-pay | Admitting: Urology

## 2021-05-27 ENCOUNTER — Ambulatory Visit (INDEPENDENT_AMBULATORY_CARE_PROVIDER_SITE_OTHER): Payer: Medicare Other | Admitting: Urology

## 2021-05-27 ENCOUNTER — Other Ambulatory Visit: Payer: Self-pay

## 2021-05-27 VITALS — BP 145/78 | HR 54 | Ht 69.0 in | Wt 186.0 lb

## 2021-05-27 DIAGNOSIS — N200 Calculus of kidney: Secondary | ICD-10-CM

## 2021-05-27 DIAGNOSIS — N529 Male erectile dysfunction, unspecified: Secondary | ICD-10-CM | POA: Diagnosis not present

## 2021-05-27 DIAGNOSIS — R399 Unspecified symptoms and signs involving the genitourinary system: Secondary | ICD-10-CM | POA: Diagnosis not present

## 2021-05-27 LAB — BLADDER SCAN AMB NON-IMAGING

## 2021-05-27 MED ORDER — TADALAFIL 20 MG PO TABS: 20.0000 mg | ORAL_TABLET | Freq: Every day | ORAL | 11 refills | Status: DC | PRN

## 2021-05-27 NOTE — Addendum Note (Signed)
Addended by: Donalee Citrin on: 05/27/2021 10:57 AM   Modules accepted: Orders

## 2021-05-27 NOTE — Progress Notes (Signed)
° °  05/27/2021 10:52 AM   Melvin Brewer 1950/11/11 [id number]  Reason for visit: Follow up UTI, ED, history of nephrolithiasis  HPI: 71 year old male who underwent right ureteroscopy, laser lithotripsy, and stent placement in November 2022 for a 7 mm right distal ureteral stone, and stent was removed in clinic.  Cystoscopy was normal at that time, and prostate measured 20 g on CT.  He had a UTI in January 2023 treated with culture appropriate Cipro which resolved his symptoms of dysuria and urinary frequency.  He also has ED and was recently started on 10 mg Cialis daily with 10 mg boost as needed.  He has noticed significant improvement on this medication, but not quite sufficient for penetration at this point.  He is interested in trying the 20 mg daily dose which I think is reasonable.  Risk and benefits discussed.  Consider checking a testosterone follow-up if persistent ED despite the Cialis increased dose.  -RTC 6 months KUB and symptom check regarding ED -Consider testosterone in the future if persistent ED despite increased Cialis dose   Billey Co, MD  [facility] [address] [place], [place] [address] [phone]

## 2021-11-24 ENCOUNTER — Ambulatory Visit
Admission: RE | Admit: 2021-11-24 | Discharge: 2021-11-24 | Disposition: A | Discharge status: Home / Self Care | Payer: Medicare Other | Attending: Urology | Admitting: Urology

## 2021-11-24 ENCOUNTER — Encounter: Payer: Self-pay | Admitting: Urology

## 2021-11-24 ENCOUNTER — Ambulatory Visit
Admission: RE | Admit: 2021-11-24 | Discharge: 2021-11-24 | Disposition: A | Discharge status: Home / Self Care | Payer: Medicare Other | Source: Ambulatory Visit | Attending: Urology | Admitting: Urology

## 2021-11-24 ENCOUNTER — Other Ambulatory Visit: Payer: Self-pay | Admitting: *Deleted

## 2021-11-24 ENCOUNTER — Ambulatory Visit (INDEPENDENT_AMBULATORY_CARE_PROVIDER_SITE_OTHER): Payer: Medicare Other | Admitting: Urology

## 2021-11-24 VITALS — BP 150/70 | HR 54 | Ht 69.0 in | Wt 190.0 lb

## 2021-11-24 DIAGNOSIS — N200 Calculus of kidney: Secondary | ICD-10-CM

## 2021-11-24 DIAGNOSIS — N529 Male erectile dysfunction, unspecified: Secondary | ICD-10-CM | POA: Diagnosis not present

## 2021-11-24 DIAGNOSIS — Z87440 Personal history of urinary (tract) infections: Secondary | ICD-10-CM | POA: Diagnosis not present

## 2021-11-24 NOTE — Progress Notes (Signed)
   11/24/2021 11:30 AM   Melvin Brewer Jul 23, 1950 [id number]  Reason for visit: Follow up UTI, ED, history of nephrolithiasis, PSA screening  HPI: 71 year old male who underwent right ureteroscopy, laser lithotripsy, and stent placement in November 2022 for a 7 mm right distal ureteral stone, and stent was removed in clinic.  Cystoscopy was normal at that time, and prostate measured 20 g on CT. I personally viewed and interpreted the KUB today that shows no evidence of stone disease.  He had a UTI in January 2023 treated with culture appropriate Cipro which resolved his symptoms of dysuria and urinary frequency.  No further UTIs or urinary symptoms since that time.  PSA last checked in February 2022 and was normal at 0.92.  Can discontinue screening per guideline recommendations.  In terms of ED, currently he is taking the 20 mg Cialis daily dose which has improved his symptoms.  He still has some mild problems with erections, but overall is mostly satisfied.  He is interested in checking a testosterone today per the guideline recommendations in patients with ED.  He also feels like he could have some loss of muscle mass and decreased energy recently.  -Cialis 20 mg daily refilled -Testosterone today, call with results.  If low will arrange PA follow-up to consider replacement options  Billey Co, MD  [facility] [address] [place], [place] [address] [phone]

## 2021-11-25 ENCOUNTER — Other Ambulatory Visit
Admission: RE | Admit: 2021-11-25 | Discharge: 2021-11-25 | Disposition: A | Discharge status: Home / Self Care | Payer: Medicare Other | Attending: Urology | Admitting: Urology

## 2021-11-25 ENCOUNTER — Other Ambulatory Visit: Payer: Self-pay | Admitting: *Deleted

## 2021-11-25 ENCOUNTER — Other Ambulatory Visit: Payer: Medicare Other

## 2021-11-25 DIAGNOSIS — R7989 Other specified abnormal findings of blood chemistry: Secondary | ICD-10-CM

## 2021-11-25 DIAGNOSIS — N529 Male erectile dysfunction, unspecified: Secondary | ICD-10-CM | POA: Diagnosis present

## 2021-11-25 NOTE — Addendum Note (Signed)
Addended by: Memory Argue H on: 11/25/2021 09:21 AM   Modules accepted: Orders

## 2021-11-26 LAB — TESTOSTERONE: Testosterone: 336 ng/dL (ref 264–916)

## 2021-12-10 DIAGNOSIS — R7303 Prediabetes: Secondary | ICD-10-CM | POA: Insufficient documentation

## 2022-06-13 ENCOUNTER — Other Ambulatory Visit: Payer: Self-pay | Admitting: *Deleted

## 2022-06-13 DIAGNOSIS — Z8679 Personal history of other diseases of the circulatory system: Secondary | ICD-10-CM

## 2022-06-20 ENCOUNTER — Other Ambulatory Visit (HOSPITAL_COMMUNITY): Payer: Medicare Other

## 2022-06-20 ENCOUNTER — Ambulatory Visit: Payer: Medicare Other

## 2022-06-27 ENCOUNTER — Encounter: Payer: Self-pay | Admitting: Physician Assistant

## 2022-06-27 ENCOUNTER — Other Ambulatory Visit: Payer: Self-pay | Admitting: Urology

## 2022-06-27 ENCOUNTER — Ambulatory Visit (HOSPITAL_COMMUNITY)
Admission: RE | Admit: 2022-06-27 | Discharge: 2022-06-27 | Disposition: A | Discharge status: Home / Self Care | Payer: Medicare Other | Source: Ambulatory Visit | Attending: Surgery | Admitting: Surgery

## 2022-06-27 ENCOUNTER — Ambulatory Visit (INDEPENDENT_AMBULATORY_CARE_PROVIDER_SITE_OTHER): Payer: Medicare Other | Admitting: Physician Assistant

## 2022-06-27 VITALS — BP 121/70 | HR 65 | Temp 98.1°F | Resp 1 | Ht 68.0 in | Wt 185.0 lb

## 2022-06-27 DIAGNOSIS — Z8679 Personal history of other diseases of the circulatory system: Secondary | ICD-10-CM | POA: Insufficient documentation

## 2022-06-27 DIAGNOSIS — I7141 Pararenal abdominal aortic aneurysm, without rupture: Secondary | ICD-10-CM

## 2022-06-27 DIAGNOSIS — Z9889 Other specified postprocedural states: Secondary | ICD-10-CM

## 2022-06-27 DIAGNOSIS — N529 Male erectile dysfunction, unspecified: Secondary | ICD-10-CM

## 2022-06-27 NOTE — Progress Notes (Signed)
HISTORY AND PHYSICAL     CC:  follow up. For EVAR Requesting Provider:  Leonel Ramsay, MD  HPI: This is a 72 y.o. male who is here today for follow up for AAA and is s/p EVAR of 5.1cm AAA on  05/03/2019 by Dr. Trula Slade.   He was seen in the office several times for bilateral groin pain after his operation that was resolved at his last visit.    Pt was last seen 12/21/2020 and at that time, he was doing well without back or abdominal pain.    Pt has hx of CAD with CABG, COPD, DM, nephrolithiasis with hx of laser lithotripsy and stent placement 02/2021.   The pt returns today for follow up studies.  He states he is doing well.  He denies any new back or abdominal pain.  He denies cramping when he walks but does endorse some cramping in his feet at night he attributes to his statin.  He states he is anemic and his PCP is working this up.    He states he did not make his appt at the year mark due to his wife having back surgery and she is doing very well.    He states he quit smoking in 2007 after his 1st open heart surgery.    The pt is on a statin for cholesterol management.    The pt is on an aspirin.    Other AC:  none The pt is on BB, diuretic for hypertension.  The pt does  have diabetes. Tobacco hx:  former  Pt does not have family hx of AAA. He states that his son is aware that he will need to be tested.   Past Medical History:  Diagnosis Date   AAA (abdominal aortic aneurysm) (HCC)    Abdominal aortic aneurysm (AAA) (HCC)    Allergic rhinitis    Anginal pain (HCC)    Centrilobular emphysema (HCC)    Cervical spine degeneration    CHF (congestive heart failure) (HCC)    COPD (chronic obstructive pulmonary disease) ([place])    Coronary artery disease    Diabetes mellitus without complication (HCC)    Diverticulitis    DJD (degenerative joint disease)    Early cataract    Eczema    GERD (gastroesophageal reflux disease)    H/O hiatal hernia    AGE 29 S  NO MEDS    Hemorrhoids    Herpes simplex infection    Hypercholesterolemia    Hyperglycemia    Hyperlipidemia    Hypertension    Kidney stone    Macular degeneration    right eye   Obesity    Shortness of breath    W/ EXERTION    Wears glasses    Wears partial dentures     Past Surgical History:  Procedure Laterality Date   ABDOMINAL AORTIC ENDOVASCULAR STENT GRAFT N/A 05/03/2019   Procedure: ABDOMINAL AORTIC ENDOVASCULAR STENT GRAFT;  Surgeon: Serafina Mitchell, MD;  Location: [facility];  Service: Vascular;  Laterality: N/A;   ANTERIOR CERVICAL DECOMP/DISCECTOMY FUSION N/A 06/20/2013   Procedure: CERVCIAL FIVE-SIX,CERVICAL SIX-SEVEN ANTERIOR CERVICAL DECOMPRESSION WITH FUSION INTERBODY PROSTHESIS PLATING AND PEEK CAGE;  Surgeon: Ophelia Charter, MD;  Location: [facility];  Service: Neurosurgery;  Laterality: N/A;  anterior   CARDIAC CATHETERIZATION     [place]  2007    CATARACT EXTRACTION W/PHACO Right 01/26/2021   Procedure: CATARACT EXTRACTION PHACO AND INTRAOCULAR LENS PLACEMENT (IOC) RIGHT DIABETIC 12.81 01:24.4;  Surgeon: Birder Robson, MD;  Location: [facility];  Service: Ophthalmology;  Laterality: Right;  Diabetic   CATARACT EXTRACTION W/PHACO Left 02/09/2021   Procedure: CATARACT EXTRACTION PHACO AND INTRAOCULAR LENS PLACEMENT (IOC) LEFT DIABETIC 6.79 00:41.5;  Surgeon: Birder Robson, MD;  Location: [facility];  Service: Ophthalmology;  Laterality: Left;  Diabetic   CHEST TUBE INSERTION Left 10/15/2018   Procedure: INSERTION OF LEFT CHEST TUBE;  Surgeon: Ivin Poot, MD;  Location: [facility];  Service: Thoracic;  Laterality: Left;   COLONOSCOPY WITH PROPOFOL N/A 12/26/2017   Procedure: COLONOSCOPY WITH PROPOFOL;  Surgeon: Lollie Sails, MD;  Location: [facility];  Service: Endoscopy;  Laterality: N/A;   CORONARY ARTERY BYPASS GRAFT     2007  @[facility]    CORONARY ARTERY BYPASS GRAFT N/A 10/04/2018   Procedure: REDO CORONARY ARTERY BYPASS GRAFTING (CABG) x 1,  SVG TO LAD, USING LEFT GREATER SAPHENOUS VEIN HARVESTED ENDOSCOPICALLY;  Surgeon: Ivin Poot, MD;  Location: [facility];  Service: Open Heart Surgery;  Laterality: N/A;   CYSTOSCOPY/URETEROSCOPY/HOLMIUM LASER/STENT PLACEMENT Right 03/03/2021   Procedure: CYSTOSCOPY/URETEROSCOPY/HOLMIUM LASER/STENT PLACEMENT;  Surgeon: Billey Co, MD;  Location: [facility];  Service: Urology;  Laterality: Right;   EYE SURGERY     RT EYE LASER (RET DETACHMENT)   RIGHT/LEFT HEART CATH AND CORONARY/GRAFT ANGIOGRAPHY Left 09/05/2018   Procedure: RIGHT/LEFT HEART CATH AND CORONARY/GRAFT ANGIOGRAPHY;  Surgeon: Yolonda Kida, MD;  Location: [facility];  Service: Cardiovascular;  Laterality: Left;   TEE WITHOUT CARDIOVERSION N/A 10/04/2018   Procedure: TRANSESOPHAGEAL ECHOCARDIOGRAM (TEE);  Surgeon: Prescott Gum, Collier Salina, MD;  Location: [facility];  Service: Open Heart Surgery;  Laterality: N/A;   TONSILLECTOMY     ULTRASOUND GUIDANCE FOR VASCULAR ACCESS Bilateral 05/03/2019   Procedure: Ultrasound Guidance For Vascular Access, bilateral femoral arteries;  Surgeon: Serafina Mitchell, MD;  Location: [facility];  Service: Vascular;  Laterality: Bilateral;   VIDEO BRONCHOSCOPY WITH INSERTION OF INTERBRONCHIAL VALVE (IBV) N/A 10/26/2018   Procedure: VIDEO BRONCHOSCOPY WITH INSERTION OF INTERBRONCHIAL VALVES (IBV);  Surgeon: Prescott Gum, Collier Salina, MD;  Location: [facility];  Service: Thoracic;  Laterality: N/A;   VIDEO BRONCHOSCOPY WITH INSERTION OF INTERBRONCHIAL VALVE (IBV) N/A 12/31/2018   Procedure: VIDEO BRONCHOSCOPY WITH REMOVAL OF INTERBRONCHIAL VALVE (IBV);  Surgeon: Prescott Gum, Collier Salina, MD;  [facility];  Service: Thoracic;  Laterality: N/A;    Allergies  Allergen Reactions   Atorvastatin Other (See Comments)    Leg cramps   Sulfa Antibiotics Rash    Sun Rash     Current Outpatient Medications  Medication Sig Dispense Refill   albuterol (PROVENTIL HFA;VENTOLIN HFA) 108 (90 BASE) MCG/ACT inhaler Inhale 2 puffs into the  lungs every 6 (six) hours as needed for wheezing or shortness of breath.     albuterol (PROVENTIL) (2.5 MG/3ML) 0.083% nebulizer solution Inhale 2.5 mg into the lungs every 6 (six) hours as needed for wheezing or shortness of breath.     aspirin EC 81 MG tablet Take 81 mg by mouth every evening.      azelastine (ASTELIN) 0.1 % nasal spray Place 1 spray into both nostrils daily. Use in each nostril as directed     Bromfenac Sodium 0.09 % SOLN Place 1 drop into the right eye 2 (two) times daily.     budesonide-formoterol (SYMBICORT) 160-4.5 MCG/ACT inhaler Inhale 2 puffs into the lungs 2 (two) times daily.     Cholecalciferol (VITAMIN D3) 50 MCG (2000 UT) TABS Take 2,000 Units  by mouth daily.     fexofenadine (ALLEGRA) 180 MG tablet Take 180 mg by mouth daily.     furosemide (LASIX) 20 MG tablet Take 20 mg by mouth daily as needed.     metFORMIN (GLUCOPHAGE) 500 MG tablet Take 500 mg by mouth in the morning and at bedtime.     metoprolol tartrate (LOPRESSOR) 25 MG tablet Take 1 tablet (25 mg total) by mouth 2 (two) times daily. 60 tablet 3   montelukast (SINGULAIR) 10 MG tablet Take 10 mg by mouth every evening.     Roflumilast (DALIRESP) 250 MCG TABS Take 250 mcg by mouth every evening.     roflumilast (DALIRESP) 500 MCG TABS tablet Take 500 mcg by mouth daily.     rosuvastatin (CRESTOR) 40 MG tablet Take 40 mg by mouth daily.     SPIRIVA RESPIMAT 2.5 MCG/ACT AERS SMARTSIG:2 Puff(s) Via Inhaler Daily     tadalafil (CIALIS) 20 MG tablet Take 1 tablet (20 mg total) by mouth daily as needed for erectile dysfunction. 30 tablet 11   triamcinolone cream (KENALOG) 0.1 %      valACYclovir (VALTREX) 500 MG tablet Take 500 mg by mouth every evening.     vitamin B-12 (CYANOCOBALAMIN) 1000 MCG tablet Take 1,000 mcg by mouth daily.     Vitamin D-Vitamin K (K2 PLUS D3 PO) Take 1 tablet by mouth daily.     No current facility-administered medications for this visit.    No family history on file.  Social  History   Socioeconomic History   Marital status: Married    Spouse name: Not on file   Number of children: Not on file   Years of education: Not on file   Highest education level: Not on file  Occupational History   Not on file  Tobacco Use   Smoking status: Former    Packs/day: 2.00    Years: 43.00    Additional pack years: 0.00    Total pack years: 86.00    Types: Cigarettes    Quit date: 04/11/2004    Years since quitting: 18.2    Passive exposure: Past   Smokeless tobacco: Never  Vaping Use   Vaping Use: Never used  Substance and Sexual Activity   Alcohol use: Yes    Alcohol/week: 7.0 standard drinks of alcohol    Types: 7 Standard drinks or equivalent per week   Drug use: No   Sexual activity: Not Currently  Other Topics Concern   Not on file  Social History Narrative   Not on file   Social Determinants of Health   Financial Resource Strain: Low Risk  (01/21/2019)   Overall Financial Resource Strain (CARDIA)    Difficulty of Paying Living Expenses: Not hard at all  Food Insecurity: No Food Insecurity (01/21/2019)   Hunger Vital Sign    Worried About Running Out of Food in the Last Year: Never true    Ran Out of Food in the Last Year: Never true  Transportation Needs: No Transportation Needs (01/21/2019)   PRAPARE - Hydrologist (Medical): No    Lack of Transportation (Non-Medical): No  Physical Activity: Sufficiently Active (01/21/2019)   Exercise Vital Sign    Days of Exercise per Week: 7 days    Minutes of Exercise per Session: 60 min  Stress: No Stress Concern Present (01/21/2019)   [place]    Feeling of Stress : Not at all  Social Connections: Moderately Integrated (01/21/2019)   Social Connection and Isolation Panel [NHANES]    Frequency of Communication with Friends and Family: Three times a week    Frequency of Social Gatherings with Friends and Family:  Three times a week    Attends Religious Services: More than 4 times per year    Active Member of Clubs or Organizations: No    Attends Archivist Meetings: Never    Marital Status: Married  Human resources officer Violence: Not At Risk (01/21/2019)   Humiliation, Afraid, Rape, and Kick questionnaire    Fear of Current or Ex-Partner: No    Emotionally Abused: No    Physically Abused: No    Sexually Abused: No     REVIEW OF SYSTEMS:   [X]  denotes positive finding, [ ]  denotes negative finding Cardiac  Comments:  Chest pain or chest pressure:    Shortness of breath upon exertion:    Short of breath when lying flat:    Irregular heart rhythm:        Vascular    Pain in calf, thigh, or hip brought on by ambulation:    Pain in feet at night that wakes you up from your sleep:     Blood clot in your veins:    Leg swelling:         Pulmonary    Oxygen at home:    Productive cough:     Wheezing:         Neurologic    Sudden weakness in arms or legs:     Sudden numbness in arms or legs:     Sudden onset of difficulty speaking or slurred speech:    Temporary loss of vision in one eye:     Problems with dizziness:         Gastrointestinal    Blood in stool:     Vomited blood:         Genitourinary    Burning when urinating:     Blood in urine:        Psychiatric    Major depression:         Hematologic    Bleeding problems:    Problems with blood clotting too easily:        Skin    Rashes or ulcers:        Constitutional    Fever or chills:      PHYSICAL EXAMINATION:  Today's Vitals   06/27/22 1007  BP: 121/70  Pulse: 65  Resp: (!) 1  Temp: 98.1 F (36.7 C)  TempSrc: Temporal  SpO2: 98%  Weight: 185 lb (83.9 kg)  Height: 5\' 8"  (1.727 m)   Body mass index is 28.13 kg/m.   General:  WDWN in NAD; vital signs documented above Gait: Not observed HENT: WNL, normocephalic Pulmonary: normal non-labored breathing  Cardiac: regular HR;  without carotid  bruits Abdomen: soft, NT; aortic pulse is not palpable Skin: without rashes Vascular Exam/Pulses:  Right Left  Radial 2+ (normal) 2+ (normal)  Popliteal Unable to palpate Unable to palpate  PT 2+ (normal) 2+ (normal)   Extremities: without open wounds Musculoskeletal: no muscle wasting or atrophy  Neurologic: A&O X 3 Psychiatric:  The pt has Normal affect.   Non-Invasive Vascular Imaging:   EVAR Arterial duplex on 06/27/2022: Endovascular Aortic Repair (EVAR):  +----------+----------------+-------------------+-------------------+           Diameter AP (cm)Diameter Trans (cm)Velocities (cm/sec)  +----------+----------------+-------------------+-------------------+  Aorta    4.83  4.97               53                   +----------+----------------+-------------------+-------------------+  Right Limb1.32            1.11               71                   +----------+----------------+-------------------+-------------------+  Left Limb 1.39            1.32               81                   +----------+----------------+-------------------+-------------------+   +-------------+----+  Endoleak TypeNone  +-------------+----+   Summary:  Abdominal Aorta: The largest aortic diameter has decreased compared to prior exam. Previous diameter measurement was 5.0 cm obtained on 12/21/2020.   Previous EVAR arterial duplex on 12/21/2020: Abdominal Aorta: Patent endovascular aneurysm repair with no evidence of endoleak. The largest aortic diameter remains essentially unchanged  compared to prior exam. Previous diameter measurement was 4.95 cm obtained on 12/08/2020.    ASSESSMENT/PLAN:: 72 y.o. male here with hx of EVAR for 5.1cm AAA on  05/03/2019 by Dr. Trula Slade.  -duplex essentially unchanged from 12/21/2020 without endoleak -continue asa/statin -pt will f/u in one year with EVAR duplex.   Leontine Locket, [facility] [phone]  Clinic MD:   Trula Slade

## 2022-11-30 ENCOUNTER — Other Ambulatory Visit: Payer: Self-pay | Admitting: *Deleted

## 2022-11-30 ENCOUNTER — Ambulatory Visit (INDEPENDENT_AMBULATORY_CARE_PROVIDER_SITE_OTHER): Payer: Medicare Other | Admitting: Urology

## 2022-11-30 ENCOUNTER — Encounter: Payer: Self-pay | Admitting: Urology

## 2022-11-30 ENCOUNTER — Ambulatory Visit
Admission: RE | Admit: 2022-11-30 | Discharge: 2022-11-30 | Disposition: A | Discharge status: Home / Self Care | Payer: Medicare Other | Attending: Urology | Admitting: Urology

## 2022-11-30 ENCOUNTER — Ambulatory Visit: Payer: Medicare Other | Admitting: Urology

## 2022-11-30 ENCOUNTER — Ambulatory Visit
Admission: RE | Admit: 2022-11-30 | Discharge: 2022-11-30 | Disposition: A | Discharge status: Home / Self Care | Payer: Medicare Other | Source: Ambulatory Visit | Attending: Urology | Admitting: Urology

## 2022-11-30 VITALS — BP 136/78 | HR 61 | Ht 69.0 in | Wt 193.0 lb

## 2022-11-30 DIAGNOSIS — Z87442 Personal history of urinary calculi: Secondary | ICD-10-CM | POA: Diagnosis not present

## 2022-11-30 DIAGNOSIS — N200 Calculus of kidney: Secondary | ICD-10-CM | POA: Diagnosis not present

## 2022-11-30 DIAGNOSIS — N529 Male erectile dysfunction, unspecified: Secondary | ICD-10-CM

## 2022-11-30 MED ORDER — TADALAFIL 20 MG PO TABS
20.0000 mg | ORAL_TABLET | Freq: Every day | ORAL | 3 refills | Status: DC
Start: 2022-11-30 — End: 2023-12-05

## 2022-11-30 NOTE — Progress Notes (Signed)
   11/30/2022 11:13 AM   Rodman Pickle March 12, 1951 [id number]  Reason for visit: Follow up UTI, ED, history of nephrolithiasis, PSA screening  HPI: 72 year old male who underwent right ureteroscopy, laser lithotripsy, and stent placement in November 2022 for a 7 mm right distal ureteral stone, and stent was removed in clinic.  Cystoscopy was normal at that time, and prostate measured 20 g on CT.   He denies any problems over the last year, specifically no UTIs or stone events.  I personally viewed and interpreted the KUB today that shows no evidence of radiopaque stone disease.  Using Cialis 20 mg daily with moderate results for ED.  He takes metoprolol twice daily which likely contributes to his ED.  Prior testosterone was normal.  PSA last checked in February 2022 and was normal at 0.92.  Can discontinue screening per guideline recommendations.  We discussed general stone prevention strategies including adequate hydration with goal of producing 2.5 L of urine daily, increasing citric acid intake, increasing calcium intake during high oxalate meals, minimizing animal protein, and decreasing salt intake. Information about dietary recommendations given today.    -Cialis 20 mg daily refilled, recommended discussing alternative to metoprolol with cardiology -RTC 1 year symptom check, consider KUB every other year  Sondra Come, MD  [facility] [address], [address] [place], [place] [address] [phone]

## 2023-01-27 ENCOUNTER — Inpatient Hospital Stay: Payer: Medicare Other | Attending: Internal Medicine | Admitting: Internal Medicine

## 2023-01-27 ENCOUNTER — Inpatient Hospital Stay: Payer: Medicare Other

## 2023-01-27 ENCOUNTER — Encounter: Payer: Self-pay | Admitting: Internal Medicine

## 2023-01-27 VITALS — BP 134/79 | HR 60 | Temp 98.6°F | Wt 198.0 lb

## 2023-01-27 DIAGNOSIS — D649 Anemia, unspecified: Secondary | ICD-10-CM | POA: Diagnosis present

## 2023-01-27 DIAGNOSIS — D539 Nutritional anemia, unspecified: Secondary | ICD-10-CM

## 2023-01-27 DIAGNOSIS — Z951 Presence of aortocoronary bypass graft: Secondary | ICD-10-CM

## 2023-01-27 DIAGNOSIS — I130 Hypertensive heart and chronic kidney disease with heart failure and stage 1 through stage 4 chronic kidney disease, or unspecified chronic kidney disease: Secondary | ICD-10-CM

## 2023-01-27 DIAGNOSIS — Z7984 Long term (current) use of oral hypoglycemic drugs: Secondary | ICD-10-CM | POA: Diagnosis not present

## 2023-01-27 DIAGNOSIS — E1122 Type 2 diabetes mellitus with diabetic chronic kidney disease: Secondary | ICD-10-CM | POA: Diagnosis not present

## 2023-01-27 DIAGNOSIS — N183 Chronic kidney disease, stage 3 unspecified: Secondary | ICD-10-CM | POA: Diagnosis not present

## 2023-01-27 DIAGNOSIS — Z87891 Personal history of nicotine dependence: Secondary | ICD-10-CM | POA: Diagnosis not present

## 2023-01-27 LAB — CBC WITH DIFFERENTIAL/PLATELET
Abs Immature Granulocytes: 0.13 10*3/uL — ABNORMAL HIGH (ref 0.00–0.07)
Basophils Absolute: 0 10*3/uL (ref 0.0–0.1)
Basophils Relative: 0 %
Eosinophils Absolute: 0.1 10*3/uL (ref 0.0–0.5)
Eosinophils Relative: 3 %
HCT: 37.8 % — ABNORMAL LOW (ref 39.0–52.0)
Hemoglobin: 12.5 g/dL — ABNORMAL LOW (ref 13.0–17.0)
Immature Granulocytes: 3 %
Lymphocytes Relative: 13 %
Lymphs Abs: 0.6 10*3/uL — ABNORMAL LOW (ref 0.7–4.0)
MCH: 33.9 pg (ref 26.0–34.0)
MCHC: 33.1 g/dL (ref 30.0–36.0)
MCV: 102.4 fL — ABNORMAL HIGH (ref 80.0–100.0)
Monocytes Absolute: 0.8 10*3/uL (ref 0.1–1.0)
Monocytes Relative: 18 %
Neutro Abs: 3 10*3/uL (ref 1.7–7.7)
Neutrophils Relative %: 63 %
Platelets: 161 10*3/uL (ref 150–400)
RBC: 3.69 MIL/uL — ABNORMAL LOW (ref 4.22–5.81)
RDW: 13 % (ref 11.5–15.5)
WBC: 4.7 10*3/uL (ref 4.0–10.5)
nRBC: 0 % (ref 0.0–0.2)

## 2023-01-27 LAB — LACTATE DEHYDROGENASE: LDH: 123 U/L (ref 98–192)

## 2023-01-27 LAB — RETICULOCYTES
Immature Retic Fract: 13.3 % (ref 2.3–15.9)
RBC.: 3.68 MIL/uL — ABNORMAL LOW (ref 4.22–5.81)
Retic Count, Absolute: 87.2 10*3/uL (ref 19.0–186.0)
Retic Ct Pct: 2.4 % (ref 0.4–3.1)

## 2023-01-27 LAB — IRON AND TIBC
Iron: 69 ug/dL (ref 45–182)
Saturation Ratios: 18 % (ref 17.9–39.5)
TIBC: 377 ug/dL (ref 250–450)
UIBC: 308 ug/dL

## 2023-01-27 LAB — VITAMIN B12: Vitamin B-12: 957 pg/mL — ABNORMAL HIGH (ref 180–914)

## 2023-01-27 LAB — FERRITIN: Ferritin: 59 ng/mL (ref 24–336)

## 2023-01-27 LAB — FOLATE: Folate: 15.4 ng/mL (ref >5.9)

## 2023-01-27 LAB — TSH: TSH: 1.252 u[IU]/mL (ref 0.350–4.500)

## 2023-01-27 NOTE — Progress Notes (Signed)
Patient has stage 3 COPD.

## 2023-01-27 NOTE — Progress Notes (Signed)
[facility]  [phone] Fax:(336) [phone]  ID: DECORIUS OUTWATER OB: 1950/06/23  MR#: [id number]  QQV#:[id number]  Patient Care Team: Mick Sell, MD as PCP - General (Infectious Diseases) Michaelyn Barter, MD as Consulting Physician (Oncology)  REFERRING PROVIDER: Dr. Sampson Goon  REASON FOR REFERRAL: Anemia  HPI: HET HELFERICH is a 72 y.o. male with past medical history of COPD, CAD, CHF, diabetes, GERD, hyperlipidemia, hypertension was referred to hematology for workup of anemia.  Labs from 01/13/2023.  WBC 6.4, hemoglobin 12.9, MCV 103.2, platelets 164.  CMP showed creatinine 1.4, EGFR 53.  From April 2024, hemoglobin 13.6 and MCV 100.  Ferritin 62.  Vitamin B12 956.  Creatinine 1.15 May 2022-hemoglobin 13.1, MCV 100  October 2022 hemoglobin 13.5, MCV 99.  Energy is low.  Alcohol - 1 ounce of scotch/whisky liquor almost every evening. Several years.  Quit smiking 2007 after first CABG  REVIEW OF SYSTEMS:   ROS  As per HPI. Otherwise, a complete review of systems is negative.  PAST MEDICAL HISTORY: Past Medical History:  Diagnosis Date   AAA (abdominal aortic aneurysm) (HCC)    Abdominal aortic aneurysm (AAA) (HCC)    Allergic rhinitis    Anginal pain (HCC)    Centrilobular emphysema (HCC)    Cervical spine degeneration    CHF (congestive heart failure) (HCC)    COPD (chronic obstructive pulmonary disease) (HCC)    Coronary artery disease    Diabetes mellitus without complication (HCC)    Diverticulitis    DJD (degenerative joint disease)    Early cataract    Eczema    GERD (gastroesophageal reflux disease)    H/O hiatal hernia    AGE 65 S  NO MEDS   Hemorrhoids    Herpes simplex infection    Hypercholesterolemia    Hyperglycemia    Hyperlipidemia    Hypertension    Kidney stone    Macular degeneration    right eye   Obesity    Shortness of breath    W/ EXERTION    Wears glasses    Wears partial  dentures     PAST SURGICAL HISTORY: Past Surgical History:  Procedure Laterality Date   ABDOMINAL AORTIC ENDOVASCULAR STENT GRAFT N/A 05/03/2019   Procedure: ABDOMINAL AORTIC ENDOVASCULAR STENT GRAFT;  Surgeon: Nada Libman, MD;  [facility]: [facility];  Service: Vascular;  Laterality: N/A;   ANTERIOR CERVICAL DECOMP/DISCECTOMY FUSION N/A 06/20/2013   Procedure: CERVCIAL FIVE-SIX,CERVICAL SIX-SEVEN ANTERIOR CERVICAL DECOMPRESSION WITH FUSION INTERBODY PROSTHESIS PLATING AND PEEK CAGE;  Surgeon: Cristi Loron, MD;  Location: [facility] NEURO ORS;  Service: Neurosurgery;  Laterality: N/A;  anterior   CARDIAC CATHETERIZATION     [place]  2007    CATARACT EXTRACTION W/PHACO Right 01/26/2021   Procedure: CATARACT EXTRACTION PHACO AND INTRAOCULAR LENS PLACEMENT (IOC) RIGHT DIABETIC 12.81 01:24.4;  Surgeon: Galen Manila, MD;  Location: [facility]GERY [facility]TR;  Service: Ophthalmology;  Laterality: Right;  Diabetic   CATARACT EXTRACTION W/PHACO Left 02/09/2021   Procedure: CATARACT EXTRACTION PHACO AND INTRAOCULAR LENS PLACEMENT (IOC) LEFT DIABETIC 6.79 00:41.5;  Surgeon: Galen Manila, MD;  Location: [facility] SURGERY [facility]TR;  Service: Ophthalmology;  Laterality: Left;  Diabetic   CHEST TUBE INSERTION Left 10/15/2018   Procedure: INSERTION OF LEFT CHEST TUBE;  Surgeon: Kerin Perna, MD;  Location: [facility] OR;  Service: Thoracic;  Laterality: Left;   COLONOSCOPY WITH PROPOFOL N/A 12/26/2017   Procedure: COLONOSCOPY WITH PROPOFOL;  Surgeon: Christena Deem, MD;  Location: [facility]  ENDOSCOPY;  Service: Endoscopy;  Laterality: N/A;   CORONARY ARTERY BYPASS GRAFT     2007  @[facility]    CORONARY ARTERY BYPASS GRAFT N/A 10/04/2018   Procedure: REDO CORONARY ARTERY BYPASS GRAFTING (CABG) x 1, SVG TO LAD, USING LEFT GREATER SAPHENOUS VEIN HARVESTED ENDOSCOPICALLY;  Surgeon: Kerin Perna, MD;  Location: [facility] OR;  Service: Open Heart Surgery;  Laterality: N/A;   CYSTOSCOPY/URETEROSCOPY/HOLMIUM LASER/STENT PLACEMENT Right  03/03/2021   Procedure: CYSTOSCOPY/URETEROSCOPY/HOLMIUM LASER/STENT PLACEMENT;  Surgeon: Sondra Come, MD;  Location: [facility] ORS;  Service: Urology;  Laterality: Right;   EYE SURGERY     RT EYE LASER (RET DETACHMENT)   RIGHT/LEFT HEART CATH AND CORONARY/GRAFT ANGIOGRAPHY Left 09/05/2018   Procedure: RIGHT/LEFT HEART CATH AND CORONARY/GRAFT ANGIOGRAPHY;  Surgeon: Alwyn Pea, MD;  Location: [facility] INVASIVE CV LAB;  Service: Cardiovascular;  Laterality: Left;   TEE WITHOUT CARDIOVERSION N/A 10/04/2018   Procedure: TRANSESOPHAGEAL ECHOCARDIOGRAM (TEE);  Surgeon: Donata Clay, Theron Arista, MD;  Location: [facility] OR;  Service: Open Heart Surgery;  Laterality: N/A;   TONSILLECTOMY     ULTRASOUND GUIDANCE FOR VASCULAR ACCESS Bilateral 05/03/2019   Procedure: Ultrasound Guidance For Vascular Access, bilateral femoral arteries;  Surgeon: Nada Libman, MD;  [facility]: [facility] OR;  Service: Vascular;  Laterality: Bilateral;   VIDEO BRONCHOSCOPY WITH INSERTION OF INTERBRONCHIAL VALVE (IBV) N/A 10/26/2018   Procedure: VIDEO BRONCHOSCOPY WITH INSERTION OF INTERBRONCHIAL VALVES (IBV);  Surgeon: Donata Clay, Theron Arista, MD;  Location: [facility] OR;  Service: Thoracic;  Laterality: N/A;   VIDEO BRONCHOSCOPY WITH INSERTION OF INTERBRONCHIAL VALVE (IBV) N/A 12/31/2018   Procedure: VIDEO BRONCHOSCOPY WITH REMOVAL OF INTERBRONCHIAL VALVE (IBV);  Surgeon: Donata Clay, Theron Arista, MD;  Location: [facility] OR;  Service: Thoracic;  Laterality: N/A;    FAMILY HISTORY: No family history on file.  HEALTH MAINTENANCE: Social History   Tobacco Use   Smoking status: Former    Current packs/day: 0.00    Average packs/day: 2.0 packs/day for 43.0 years (86.0 ttl pk-yrs)    Types: Cigarettes    Start date: 04/11/1961    Quit date: 04/11/2004    Years since quitting: 18.8    Passive exposure: Past   Smokeless tobacco: Never  Vaping Use   Vaping status: Never Used  Substance Use Topics   Alcohol use: Yes    Alcohol/week: 7.0 standard drinks of alcohol     Types: 7 Standard drinks or equivalent per week   Drug use: No     Allergies  Allergen Reactions   Atorvastatin Other (See Comments)    Leg cramps   Sulfa Antibiotics Rash    Sun Rash     Current Outpatient Medications  Medication Sig Dispense Refill   albuterol (PROVENTIL HFA;VENTOLIN HFA) 108 (90 BASE) MCG/ACT inhaler Inhale 2 puffs into the lungs every 6 (six) hours as needed for wheezing or shortness of breath.     albuterol (PROVENTIL) (2.5 MG/3ML) 0.083% nebulizer solution Inhale 2.5 mg into the lungs every 6 (six) hours as needed for wheezing or shortness of breath.     aspirin EC 81 MG tablet Take 81 mg by mouth every evening.      azelastine (ASTELIN) 0.1 % nasal spray Place 1 spray into both nostrils daily. Use in each nostril as directed     fexofenadine (ALLEGRA) 180 MG tablet Take 180 mg by mouth daily.     metFORMIN (GLUCOPHAGE) 500 MG tablet Take 500 mg by mouth in the morning and at bedtime.     metoprolol tartrate (LOPRESSOR)  25 MG tablet Take 1 tablet (25 mg total) by mouth 2 (two) times daily. 60 tablet 3   montelukast (SINGULAIR) 10 MG tablet Take 10 mg by mouth every evening.     roflumilast (DALIRESP) 500 MCG TABS tablet Take 500 mcg by mouth daily.     rosuvastatin (CRESTOR) 40 MG tablet Take 40 mg by mouth daily.     SPIRIVA RESPIMAT 2.5 MCG/ACT AERS SMARTSIG:2 Puff(s) Via Inhaler Daily     tadalafil (CIALIS) 20 MG tablet Take 1 tablet (20 mg total) by mouth daily. 90 tablet 3   triamcinolone cream (KENALOG) 0.1 %      valACYclovir (VALTREX) 500 MG tablet Take 500 mg by mouth every evening.     vitamin B-12 (CYANOCOBALAMIN) 1000 MCG tablet Take 1,000 mcg by mouth daily.     Vitamin D-Vitamin K (K2 PLUS D3 PO) Take 1 tablet by mouth daily.     WIXELA INHUB 250-50 MCG/ACT AEPB Inhale 1 puff into the lungs 2 (two) times daily.     No current facility-administered medications for this visit.    OBJECTIVE: There were no vitals filed for this visit.   There is  no height or weight on file to calculate BMI.      General: Well-developed, well-nourished, no acute distress. Eyes: Pink conjunctiva, anicteric sclera. HEENT: Normocephalic, moist mucous membranes, clear oropharnyx. Lungs: Clear to auscultation bilaterally. Heart: Regular rate and rhythm. No rubs, murmurs, or gallops. Abdomen: Soft, nontender, nondistended. No organomegaly noted, normoactive bowel sounds. Musculoskeletal: No edema, cyanosis, or clubbing. Neuro: Alert, answering all questions appropriately. Cranial nerves grossly intact. Skin: No rashes or petechiae noted. Psych: Normal affect. Lymphatics: No cervical, calvicular, axillary or inguinal LAD.   LAB RESULTS:  Lab Results  Component Value Date   NA 142 02/24/2021   K 4.3 02/24/2021   CL 106 02/24/2021   CO2 27 02/24/2021   GLUCOSE 118 (H) 02/24/2021   BUN 21 02/24/2021   CREATININE 1.18 02/24/2021   CALCIUM 9.1 02/24/2021   PROT 6.2 (L) 05/01/2019   ALBUMIN 3.6 05/01/2019   AST 20 05/01/2019   ALT 25 05/01/2019   ALKPHOS 50 05/01/2019   BILITOT 0.7 05/01/2019   GFRNONAA >60 02/24/2021   GFRAA >60 05/04/2019    Lab Results  Component Value Date   WBC 8.4 02/24/2021   HGB 14.6 02/24/2021   HCT 44.4 02/24/2021   MCV 99.1 02/24/2021   PLT 198 02/24/2021    No results found for: "TIBC", "FERRITIN", "IRONPCTSAT"   STUDIES: No results found.  ASSESSMENT AND PLAN:   AADI RICHARDSON is a 72 y.o. male with pmh of COPD, CAD, CHF, diabetes, GERD, hyperlipidemia, hypertension was referred to hematology for workup of anemia.  # Macrocytic anemia # CKD stage III -Of unknown etiology. Labs from 01/13/2023.  WBC 6.4, hemoglobin 12.9, MCV 103.2, platelets 164.  CMP showed creatinine 1.4, EGFR 53.  Baseline hemoglobin around 13.1-13.6.  -I discussed with the patient about mild anemia, macrocytosis and various etiologies.  Will obtain lab work as below for further workup.  I will follow-up with him in 2 weeks to  discuss labs.  If workup is negative, I would favor monitoring with lab work every 6 months.  If hemoglobin continues to decline, would consider bone marrow biopsy.  Patient report history of chronic alcohol use which can cause macrocytosis.  He also has CKD stage III since April 2024 which could be coming from longstanding hypertension.  CKD can also contribute to anemia  but it does not necessarily need any intervention with EPO injections unless hemoglobin goes below 9.  He was currently started on B12 and folate supplements by Dr. Sampson Goon which he can continue.  Orders Placed This Encounter  Procedures   CBC with Differential/Platelet   Vitamin B12   Folate   Multiple Myeloma Panel (SPEP&IFE w/QIG)   Kappa/lambda light chains   Reticulocytes   TSH   Ferritin   Iron and TIBC   Lactate dehydrogenase   Haptoglobin   RTC in 2 weeks video visit to discuss labs.  Patient expressed understanding and was in agreement with this plan. He also understands that He can call clinic at any time with any questions, concerns, or complaints.   I spent a total of 45 minutes reviewing chart data, face-to-face evaluation with the patient, counseling and coordination of care as detailed above.  Michaelyn Barter, MD   01/27/2023 11:08 AM

## 2023-01-28 LAB — HAPTOGLOBIN: Haptoglobin: 175 mg/dL (ref 34–355)

## 2023-01-30 LAB — KAPPA/LAMBDA LIGHT CHAINS
Kappa free light chain: 26.9 mg/L — ABNORMAL HIGH (ref 3.3–19.4)
Kappa, lambda light chain ratio: 1.41 (ref 0.26–1.65)
Lambda free light chains: 19.1 mg/L (ref 5.7–26.3)

## 2023-02-01 LAB — MULTIPLE MYELOMA PANEL, SERUM
Albumin SerPl Elph-Mcnc: 3.5 g/dL (ref 2.9–4.4)
Albumin/Glob SerPl: 1.5 (ref 0.7–1.7)
Alpha 1: 0.2 g/dL (ref 0.0–0.4)
Alpha2 Glob SerPl Elph-Mcnc: 0.8 g/dL (ref 0.4–1.0)
B-Globulin SerPl Elph-Mcnc: 1 g/dL (ref 0.7–1.3)
Gamma Glob SerPl Elph-Mcnc: 0.5 g/dL (ref 0.4–1.8)
Globulin, Total: 2.5 g/dL (ref 2.2–3.9)
IgA: 135 mg/dL (ref 61–437)
IgG (Immunoglobin G), Serum: 542 mg/dL — ABNORMAL LOW (ref 603–1613)
IgM (Immunoglobulin M), Srm: 18 mg/dL (ref 15–143)
Total Protein ELP: 6 g/dL (ref 6.0–8.5)

## 2023-02-13 ENCOUNTER — Encounter: Payer: Self-pay | Admitting: Internal Medicine

## 2023-02-13 ENCOUNTER — Inpatient Hospital Stay: Payer: Medicare Other | Attending: Internal Medicine | Admitting: Internal Medicine

## 2023-02-13 DIAGNOSIS — N1831 Chronic kidney disease, stage 3a: Secondary | ICD-10-CM

## 2023-02-13 DIAGNOSIS — D539 Nutritional anemia, unspecified: Secondary | ICD-10-CM | POA: Diagnosis not present

## 2023-02-13 DIAGNOSIS — D649 Anemia, unspecified: Secondary | ICD-10-CM | POA: Insufficient documentation

## 2023-02-13 DIAGNOSIS — N183 Chronic kidney disease, stage 3 unspecified: Secondary | ICD-10-CM | POA: Insufficient documentation

## 2023-02-13 NOTE — Progress Notes (Signed)
[facility]  Telephone:([phone] Fax:(336) [phone]  I connected with Melvin Brewer on 02/13/23 at  3:30 PM EST by my chart video and verified that I am speaking with the correct person using two identifiers.   I discussed the limitations, risks, security and privacy concerns of performing an evaluation and management service by telemedicine and the availability of in-person appointments. I also discussed with the patient that there may be a patient responsible charge related to this service. The patient expressed understanding and agreed to proceed.   Other persons participating in the visit and their role in the encounter: none   Patient's location: home  Provider's location: clinic   Chief Complaint: discuss labs  ID: Melvin Brewer OB: [birth year]  MR#: [id number]  DGL#:[id number]  Patient Care Team: Mick Sell, MD as PCP - General (Infectious Diseases) Michaelyn Barter, MD as Consulting Physician (Oncology)  REASON FOR REFERRAL: Anemia  HPI: Melvin Brewer is a 72 y.o. male with past medical history of COPD, CAD, CHF, diabetes, GERD, hyperlipidemia, hypertension was referred to hematology for workup of anemia.  Labs from 01/13/2023.  WBC 6.4, hemoglobin 12.9, MCV 103.2, platelets 164.  CMP showed creatinine 1.4, EGFR 53.  From April 2024, hemoglobin 13.6 and MCV 100.  Ferritin 62.  Vitamin B12 956.  Creatinine 1.15 May 2022-hemoglobin 13.1, MCV 100  October 2022 hemoglobin 13.5, MCV 99.  Energy is low.  Alcohol - 1 ounce of scotch/whisky liquor almost every evening. Several years.  Quit smiking 2007 after first CABG  Interval history Connected with the patient via MyChart video visit. Denies any new concerns since the last visit.  REVIEW OF SYSTEMS:   ROS  As per HPI. Otherwise, a complete review of systems is negative.  PAST MEDICAL HISTORY: Past Medical History:  Diagnosis Date   AAA (abdominal aortic aneurysm)  (HCC)    Abdominal aortic aneurysm (AAA) (HCC)    Allergic rhinitis    Anginal pain (HCC)    Centrilobular emphysema (HCC)    Cervical spine degeneration    CHF (congestive heart failure) (HCC)    COPD (chronic obstructive pulmonary disease) (HCC)    Coronary artery disease    Diabetes mellitus without complication (HCC)    Diverticulitis    DJD (degenerative joint disease)    Early cataract    Eczema    GERD (gastroesophageal reflux disease)    H/O hiatal hernia    AGE 89 S  NO MEDS   Hemorrhoids    Herpes simplex infection    Hypercholesterolemia    Hyperglycemia    Hyperlipidemia    Hypertension    Kidney stone    Macular degeneration    right eye   Obesity    Shortness of breath    W/ EXERTION    Wears glasses    Wears partial dentures     PAST SURGICAL HISTORY: Past Surgical History:  Procedure Laterality Date   ABDOMINAL AORTIC ENDOVASCULAR STENT GRAFT N/A 05/03/2019   Procedure: ABDOMINAL AORTIC ENDOVASCULAR STENT GRAFT;  Surgeon: Nada Libman, MD;  Location: [facility] OR;  Service: Vascular;  Laterality: N/A;   ANTERIOR CERVICAL DECOMP/DISCECTOMY FUSION N/A 06/20/2013   Procedure: CERVCIAL FIVE-SIX,CERVICAL SIX-SEVEN ANTERIOR CERVICAL DECOMPRESSION WITH FUSION INTERBODY PROSTHESIS PLATING AND PEEK CAGE;  Surgeon: Cristi Loron, MD;  Location: [facility]UR[facility];  Service: Neurosurgery;  Laterality: N/A;  anterior   CARDIAC CATHETERIZATION     [place]  2007    CATARACT EXTRACTION W/PHACO Right  01/26/2021   Procedure: CATARACT EXTRACTION PHACO AND INTRAOCULAR LENS PLACEMENT (IOC) RIGHT DIABETIC 12.81 01:24.4;  Surgeon: Galen Manila, MD;  Location: [facility];  Service: Ophthalmology;  Laterality: Right;  Diabetic   CATARACT EXTRACTION W/PHACO Left 02/09/2021   Procedure: CATARACT EXTRACTION PHACO AND INTRAOCULAR LENS PLACEMENT (IOC) LEFT DIABETIC 6.79 00:41.5;  Surgeon: Galen Manila, MD;  Location: [facility] SURGERY [facility];  Service: Ophthalmology;   Laterality: Left;  Diabetic   CHEST TUBE INSERTION Left 10/15/2018   Procedure: INSERTION OF LEFT CHEST TUBE;  Surgeon: Kerin Perna, MD;  Location: [facility] OR;  Service: Thoracic;  Laterality: Left;   COLONOSCOPY WITH PROPOFOL N/A 12/26/2017   Procedure: COLONOSCOPY WITH PROPOFOL;  Surgeon: Christena Deem, MD;  Location: [facility] ENDOSCOPY;  Service: Endoscopy;  Laterality: N/A;   CORONARY ARTERY BYPASS GRAFT     2007  @[facility]    CORONARY ARTERY BYPASS GRAFT N/A 10/04/2018   Procedure: REDO CORONARY ARTERY BYPASS GRAFTING (CABG) x 1, SVG TO LAD, USING LEFT GREATER SAPHENOUS VEIN HARVESTED ENDOSCOPICALLY;  Surgeon: Kerin Perna, MD;  Location: [facility] OR;  Service: Open Heart Surgery;  Laterality: N/A;   CYSTOSCOPY/URETEROSCOPY/HOLMIUM LASER/STENT PLACEMENT Right 03/03/2021   Procedure: CYSTOSCOPY/URETEROSCOPY/HOLMIUM LASER/STENT PLACEMENT;  Surgeon: Sondra Come, MD;  Location: [facility] ORS;  Service: Urology;  Laterality: Right;   EYE SURGERY     RT EYE LASER (RET DETACHMENT)   RIGHT/LEFT HEART CATH AND CORONARY/GRAFT ANGIOGRAPHY Left 09/05/2018   Procedure: RIGHT/LEFT HEART CATH AND CORONARY/GRAFT ANGIOGRAPHY;  Surgeon: Alwyn Pea, MD;  Location: [facility] INVASIVE CV LAB;  Service: Cardiovascular;  Laterality: Left;   TEE WITHOUT CARDIOVERSION N/A 10/04/2018   Procedure: TRANSESOPHAGEAL ECHOCARDIOGRAM (TEE);  Surgeon: Donata Clay, Theron Arista, MD;  [facility] OR;  Service: Open Heart Surgery;  Laterality: N/A;   TONSILLECTOMY     ULTRASOUND GUIDANCE FOR VASCULAR ACCESS Bilateral 05/03/2019   Procedure: Ultrasound Guidance For Vascular Access, bilateral femoral arteries;  Surgeon: Nada Libman, MD;  Location: [facility] OR;  Service: Vascular;  Laterality: Bilateral;   VIDEO BRONCHOSCOPY WITH INSERTION OF INTERBRONCHIAL VALVE (IBV) N/A 10/26/2018   Procedure: VIDEO BRONCHOSCOPY WITH INSERTION OF INTERBRONCHIAL VALVES (IBV);  Surgeon: Donata Clay, Theron Arista, MD;  Location: [facility] OR;  Service: Thoracic;  Laterality: N/A;    VIDEO BRONCHOSCOPY WITH INSERTION OF INTERBRONCHIAL VALVE (IBV) N/A 12/31/2018   Procedure: VIDEO BRONCHOSCOPY WITH REMOVAL OF INTERBRONCHIAL VALVE (IBV);  Surgeon: Donata Clay, Theron Arista, MD;  Location: [facility] OR;  Service: Thoracic;  Laterality: N/A;    FAMILY HISTORY: Family History  Problem Relation Age of Onset   Stroke Mother    Stroke Father     HEALTH MAINTENANCE: Social History   Tobacco Use   Smoking status: Former    Current packs/day: 0.00    Average packs/day: 2.0 packs/day for 43.0 years (86.0 ttl pk-yrs)    Types: Cigarettes    Start date: 04/11/1961    Quit date: 04/11/2004    Years since quitting: 18.8    Passive exposure: Past   Smokeless tobacco: Never  Vaping Use   Vaping status: Never Used  Substance Use Topics   Alcohol use: Yes    Alcohol/week: 7.0 standard drinks of alcohol    Types: 7 Standard drinks or equivalent per week   Drug use: No     Allergies  Allergen Reactions   Atorvastatin Other (See Comments)    Leg cramps   Sulfa Antibiotics Rash    Sun Rash     Current Outpatient Medications  Medication Sig  Dispense Refill   albuterol (PROVENTIL HFA;VENTOLIN HFA) 108 (90 BASE) MCG/ACT inhaler Inhale 2 puffs into the lungs every 6 (six) hours as needed for wheezing or shortness of breath.     albuterol (PROVENTIL) (2.5 MG/3ML) 0.083% nebulizer solution Inhale 2.5 mg into the lungs every 6 (six) hours as needed for wheezing or shortness of breath.     aspirin EC 81 MG tablet Take 81 mg by mouth every evening.      azelastine (ASTELIN) 0.1 % nasal spray Place 1 spray into both nostrils daily. Use in each nostril as directed     co-enzyme Q-10 30 MG capsule Take 30 mg by mouth daily.     fexofenadine (ALLEGRA) 180 MG tablet Take 180 mg by mouth daily.     Folic Acid 0.8 MG CAPS      hydrochlorothiazide (HYDRODIURIL) 25 MG tablet Take 25 mg by mouth daily.     losartan (COZAAR) 25 MG tablet Take 25 mg by mouth daily.     metFORMIN (GLUCOPHAGE) 500 MG tablet  Take 500 mg by mouth daily.     metoprolol tartrate (LOPRESSOR) 25 MG tablet Take 1 tablet (25 mg total) by mouth 2 (two) times daily. 60 tablet 3   montelukast (SINGULAIR) 10 MG tablet Take 10 mg by mouth every evening.     omeprazole (PRILOSEC) 40 MG capsule Take 40 mg by mouth daily.     roflumilast (DALIRESP) 500 MCG TABS tablet Take 500 mcg by mouth daily.     rosuvastatin (CRESTOR) 40 MG tablet Take 40 mg by mouth daily.     SPIRIVA RESPIMAT 2.5 MCG/ACT AERS SMARTSIG:2 Puff(s) Via Inhaler Daily     tadalafil (CIALIS) 20 MG tablet Take 1 tablet (20 mg total) by mouth daily. 90 tablet 3   triamcinolone cream (KENALOG) 0.1 %      valACYclovir (VALTREX) 500 MG tablet Take 500 mg by mouth every evening.     vitamin B-12 (CYANOCOBALAMIN) 1000 MCG tablet Take 1,000 mcg by mouth daily.     Vitamin D-Vitamin K (K2 PLUS D3 PO) Take 1 tablet by mouth daily.     WIXELA INHUB 250-50 MCG/ACT AEPB Inhale 1 puff into the lungs 2 (two) times daily.     No current facility-administered medications for this visit.    OBJECTIVE: There were no vitals filed for this visit.   There is no height or weight on file to calculate BMI.      Physical exam not performed.  Virtual visit   LAB RESULTS:  Lab Results  Component Value Date   NA 142 02/24/2021   K 4.3 02/24/2021   CL 106 02/24/2021   CO2 27 02/24/2021   GLUCOSE 118 (H) 02/24/2021   BUN 21 02/24/2021   CREATININE 1.18 02/24/2021   CALCIUM 9.1 02/24/2021   PROT 6.2 (L) 05/01/2019   ALBUMIN 3.6 05/01/2019   AST 20 05/01/2019   ALT 25 05/01/2019   ALKPHOS 50 05/01/2019   BILITOT 0.7 05/01/2019   GFRNONAA >60 02/24/2021   GFRAA >60 05/04/2019    Lab Results  Component Value Date   WBC 4.7 01/27/2023   NEUTROABS 3.0 01/27/2023   HGB 12.5 (L) 01/27/2023   HCT 37.8 (L) 01/27/2023   MCV 102.4 (H) 01/27/2023   PLT 161 01/27/2023    Lab Results  Component Value Date   TIBC 377 01/27/2023   FERRITIN 59 01/27/2023   IRONPCTSAT 18  01/27/2023     STUDIES: No results found.  ASSESSMENT  AND PLAN:   Melvin Brewer is a 72 y.o. male with pmh of COPD, CAD, CHF, diabetes, GERD, hyperlipidemia, hypertension was referred to hematology for workup of anemia.  # Macrocytic anemia # CKD stage III -Differentials include CKD, alcohol use.  ?MDS  -Workup-iron panel/vitamin B12/folate normal.  Hemolytic panel negative.  SPEP/IFE and kappa lambda normal.  TSH normal.  -Currently, his anemia is mild with hemoglobin above 12.5.  He has CKD which could be contributing to anemia.  Macrocytosis could be coming from daily chronic alcohol use.  I did discuss about possibility of bone marrow cancer such as MDS with macrocytosis.  But since his anemia is mild we can continue to monitor.  If it continues to get worse would consider bone marrow biopsy.  He is scheduled for follow-up with Dr. Sampson Goon with labs in February.  I will send my note to him.  And then I will follow-up with him 6 months after that for monitoring.   Orders Placed This Encounter  Procedures   CBC with Differential (Cancer Center Only)   Iron and TIBC([facility])   Vitamin B12   Lactate dehydrogenase   Ferritin   Lactate dehydrogenase   He will follow-up with Dr. Sampson Goon in February 2025 I will follow-up with him 6 months after that around August with labs.  Patient expressed understanding and was in agreement with this plan. He also understands that He can call clinic at any time with any questions, concerns, or complaints.   I spent a total of 25 minutes reviewing chart data, face-to-face evaluation with the patient, counseling and coordination of care as detailed above.  Michaelyn Barter, MD   02/13/2023 4:22 PM

## 2023-06-28 ENCOUNTER — Other Ambulatory Visit: Payer: Self-pay

## 2023-06-28 DIAGNOSIS — I7141 Pararenal abdominal aortic aneurysm, without rupture: Secondary | ICD-10-CM

## 2023-07-07 NOTE — Progress Notes (Signed)
 HISTORY AND PHYSICAL     CC:  follow up. For EVAR Requesting Provider:  Mick Sell, MD  HPI: This is a 73 y.o. male who is here today for follow up for AAA and is s/p EVAR  of 5.1cm AAA on  05/03/2019 by Dr. Myra Gianotti.   He was seen in the office several times for bilateral groin pain after his operation that did resolve.   Pt was last seen 06/27/2022 and at that time, he was not having any new back or abdominal pain.  The pt returns today for follow up studies.  He states he has not had any new abdominal or back pain.  He denies any cramping in his legs with walking but does have some cramps at night.  He denies any neurological symptoms with visual changes, speech difficulties or one sided weakness, numbness or paralysis.    He states his wife did have her back surgery but since then has also had a VP shunt placed.  He is her caregiver.    The pt is on a statin for cholesterol management.    The pt is on an aspirin.    Other AC:  none The pt is on ARB, BB for hypertension.  The pt is  on medication for diabetes. Tobacco hx:  former  Pt does not have family hx of AAA. He states that his son is aware that he will need to be tested.    Past Medical History:  Diagnosis Date   AAA (abdominal aortic aneurysm) (HCC)    Abdominal aortic aneurysm (AAA) (HCC)    Allergic rhinitis    Anginal pain (HCC)    Centrilobular emphysema (HCC)    Cervical spine degeneration    CHF (congestive heart failure) (HCC)    COPD (chronic obstructive pulmonary disease) (HCC)    Coronary artery disease    Diabetes mellitus without complication (HCC)    Diverticulitis    DJD (degenerative joint disease)    Early cataract    Eczema    GERD (gastroesophageal reflux disease)    H/O hiatal hernia    AGE 72 S  NO MEDS   Hemorrhoids    Herpes simplex infection    Hypercholesterolemia    Hyperglycemia    Hyperlipidemia    Hypertension    Kidney stone    Macular degeneration    right eye    Obesity    Shortness of breath    W/ EXERTION    Wears glasses    Wears partial dentures     Past Surgical History:  Procedure Laterality Date   ABDOMINAL AORTIC ENDOVASCULAR STENT GRAFT N/A 05/03/2019   Procedure: ABDOMINAL AORTIC ENDOVASCULAR STENT GRAFT;  Surgeon: Nada Libman, MD;  Location: [facility] OR;  Service: Vascular;  Laterality: N/A;   ANTERIOR CERVICAL DECOMP/DISCECTOMY FUSION N/A 06/20/2013   Procedure: CERVCIAL FIVE-SIX,CERVICAL SIX-SEVEN ANTERIOR CERVICAL DECOMPRESSION WITH FUSION INTERBODY PROSTHESIS PLATING AND PEEK CAGE;  Surgeon: Cristi Loron, MD;  Location: [facility];  Service: Neurosurgery;  Laterality: N/A;  anterior   CARDIAC CATHETERIZATION     [place]  2007    CATARACT EXTRACTION W/PHACO Right 01/26/2021   Procedure: CATARACT EXTRACTION PHACO AND INTRAOCULAR LENS PLACEMENT (IOC) RIGHT DIABETIC 12.81 01:24.4;  Surgeon: Galen Manila, MD;  Location: [facility];  Service: Ophthalmology;  Laterality: Right;  Diabetic   CATARACT EXTRACTION W/PHACO Left 02/09/2021   Procedure: CATARACT EXTRACTION PHACO AND INTRAOCULAR LENS PLACEMENT (IOC) LEFT DIABETIC 6.79 00:41.5;  Surgeon: Galen Manila,  MD;  Location: [facility];  Service: Ophthalmology;  Laterality: Left;  Diabetic   CHEST TUBE INSERTION Left 10/15/2018   Procedure: INSERTION OF LEFT CHEST TUBE;  Surgeon: Kerin Perna, MD;  Location: [facility] OR;  Service: Thoracic;  Laterality: Left;   COLONOSCOPY WITH PROPOFOL N/A 12/26/2017   Procedure: COLONOSCOPY WITH PROPOFOL;  Surgeon: Christena Deem, MD;  Location: [facility] ENDOSCOPY;  Service: Endoscopy;  Laterality: N/A;   CORONARY ARTERY BYPASS GRAFT     2007  @[facility]    CORONARY ARTERY BYPASS GRAFT N/A 10/04/2018   Procedure: REDO CORONARY ARTERY BYPASS GRAFTING (CABG) x 1, SVG TO LAD, USING LEFT GREATER SAPHENOUS VEIN HARVESTED ENDOSCOPICALLY;  Surgeon: Kerin Perna, MD;  Location: [facility] OR;  Service: Open Heart Surgery;  Laterality: N/A;    CYSTOSCOPY/URETEROSCOPY/HOLMIUM LASER/STENT PLACEMENT Right 03/03/2021   Procedure: CYSTOSCOPY/URETEROSCOPY/HOLMIUM LASER/STENT PLACEMENT;  Surgeon: Sondra Come, MD;  Location: [facility] ORS;  Service: Urology;  Laterality: Right;   EYE SURGERY     RT EYE LASER (RET DETACHMENT)   RIGHT/LEFT HEART CATH AND CORONARY/GRAFT ANGIOGRAPHY Left 09/05/2018   Procedure: RIGHT/LEFT HEART CATH AND CORONARY/GRAFT ANGIOGRAPHY;  Surgeon: Alwyn Pea, MD;  Location: [facility] INVASIVE CV LAB;  Service: Cardiovascular;  Laterality: Left;   TEE WITHOUT CARDIOVERSION N/A 10/04/2018   Procedure: TRANSESOPHAGEAL ECHOCARDIOGRAM (TEE);  Surgeon: Donata Clay, Theron Arista, MD;  Location: [facility] OR;  Service: Open Heart Surgery;  Laterality: N/A;   TONSILLECTOMY     ULTRASOUND GUIDANCE FOR VASCULAR ACCESS Bilateral 05/03/2019   Procedure: Ultrasound Guidance For Vascular Access, bilateral femoral arteries;  Surgeon: Nada Libman, MD;  Location: [facility] OR;  Service: Vascular;  Laterality: Bilateral;   VIDEO BRONCHOSCOPY WITH INSERTION OF INTERBRONCHIAL VALVE (IBV) N/A 10/26/2018   Procedure: VIDEO BRONCHOSCOPY WITH INSERTION OF INTERBRONCHIAL VALVES (IBV);  Surgeon: Donata Clay, Theron Arista, MD;  Location: [facility] OR;  Service: Thoracic;  Laterality: N/A;   VIDEO BRONCHOSCOPY WITH INSERTION OF INTERBRONCHIAL VALVE (IBV) N/A 12/31/2018   Procedure: VIDEO BRONCHOSCOPY WITH REMOVAL OF INTERBRONCHIAL VALVE (IBV);  Surgeon: Donata Clay, Theron Arista, MD;  Location: [facility] OR;  Service: Thoracic;  Laterality: N/A;    Allergies  Allergen Reactions   Atorvastatin Other (See Comments)    Leg cramps   Sulfa Antibiotics Rash    Sun Rash     Current Outpatient Medications  Medication Sig Dispense Refill   albuterol (PROVENTIL HFA;VENTOLIN HFA) 108 (90 BASE) MCG/ACT inhaler Inhale 2 puffs into the lungs every 6 (six) hours as needed for wheezing or shortness of breath.     albuterol (PROVENTIL) (2.5 MG/3ML) 0.083% nebulizer solution Inhale 2.5 mg into the lungs every  6 (six) hours as needed for wheezing or shortness of breath.     aspirin EC 81 MG tablet Take 81 mg by mouth every evening.      azelastine (ASTELIN) 0.1 % nasal spray Place 1 spray into both nostrils daily. Use in each nostril as directed     co-enzyme Q-10 30 MG capsule Take 30 mg by mouth daily.     fexofenadine (ALLEGRA) 180 MG tablet Take 180 mg by mouth daily.     Folic Acid 0.8 MG CAPS      hydrochlorothiazide (HYDRODIURIL) 25 MG tablet Take 25 mg by mouth daily.     losartan (COZAAR) 25 MG tablet Take 25 mg by mouth daily.     metFORMIN (GLUCOPHAGE) 500 MG tablet Take 500 mg by mouth daily.     metoprolol tartrate (LOPRESSOR) 25 MG tablet Take 1  tablet (25 mg total) by mouth 2 (two) times daily. 60 tablet 3   montelukast (SINGULAIR) 10 MG tablet Take 10 mg by mouth every evening.     omeprazole (PRILOSEC) 40 MG capsule Take 40 mg by mouth daily.     roflumilast (DALIRESP) 500 MCG TABS tablet Take 500 mcg by mouth daily.     rosuvastatin (CRESTOR) 40 MG tablet Take 40 mg by mouth daily.     SPIRIVA RESPIMAT 2.5 MCG/ACT AERS SMARTSIG:2 Puff(s) Via Inhaler Daily     tadalafil (CIALIS) 20 MG tablet Take 1 tablet (20 mg total) by mouth daily. 90 tablet 3   triamcinolone cream (KENALOG) 0.1 %      valACYclovir (VALTREX) 500 MG tablet Take 500 mg by mouth every evening.     vitamin B-12 (CYANOCOBALAMIN) 1000 MCG tablet Take 1,000 mcg by mouth daily.     Vitamin D-Vitamin K (K2 PLUS D3 PO) Take 1 tablet by mouth daily.     WIXELA INHUB 250-50 MCG/ACT AEPB Inhale 1 puff into the lungs 2 (two) times daily.     No current facility-administered medications for this visit.    Family History  Problem Relation Age of Onset   Stroke Mother    Stroke Father     Social History   Socioeconomic History   Marital status: Married    Spouse name: Not on file   Number of children: Not on file   Years of education: Not on file   Highest education level: Not on file  Occupational History   Not  on file  Tobacco Use   Smoking status: Former    Current packs/day: 0.00    Average packs/day: 2.0 packs/day for 43.0 years (86.0 ttl pk-yrs)    Types: Cigarettes    Start date: 04/11/1961    Quit date: 04/11/2004    Years since quitting: 19.2    Passive exposure: Past   Smokeless tobacco: Never  Vaping Use   Vaping status: Never Used  Substance and Sexual Activity   Alcohol use: Yes    Alcohol/week: 7.0 standard drinks of alcohol    Types: 7 Standard drinks or equivalent per week   Drug use: No   Sexual activity: Not Currently  Other Topics Concern   Not on file  Social History Narrative   Not on file   Social Drivers of Health   Financial Resource Strain: Low Risk  (06/05/2023)   Received from [facility]   Overall Financial Resource Strain (CARDIA)    Difficulty of Paying Living Expenses: Not hard at all  Food Insecurity: No Food Insecurity (06/05/2023)   Received from [facility]   Hunger Vital Sign    Worried About Running Out of Food in the Last Year: Never true    Ran Out of Food in the Last Year: Never true  Transportation Needs: No Transportation Needs (06/05/2023)   Received [facility]    In the past 12 months, has lack of transportation kept you from medical appointments or from getting medications?: No    Lack of Transportation (Non-Medical): No  Physical Activity: Low Risk  (02/28/2022)   [facility], [facility]   PCARE Exercise SDOH    Exercise: Active Lifestyle Only    PCare Exercise SDOH: Not on file    PCare Exercise SDOH: Not on file  Stress: No Stress Concern Present (07/04/2019)   Received  from [facility], [facility]   [employer] of Occupational Health - Occupational Stress Questionnaire    Feeling of Stress : Not at all  Social Connections: Socially Integrated (07/04/2019)    Received from [facility], [facility]   Social Connection and Isolation Panel [NHANES]    Frequency of Communication with Friends and Family: More than three times a week    Frequency of Social Gatherings with Friends and Family: Twice a week    Attends Religious Services: More than 4 times per year    Active Member of [employer] or Organizations: Yes    Attends Banker Meetings: 1 to 4 times per year    Marital Status: Married  Catering manager Violence: Not At Risk (01/27/2023)   Humiliation, Afraid, Rape, and Kick questionnaire    Fear of Current or Ex-Partner: No    Emotionally Abused: No    Physically Abused: No    Sexually Abused: No     REVIEW OF SYSTEMS:   [X]  denotes positive finding, [ ]  denotes negative finding Cardiac  Comments:  Chest pain or chest pressure:    Shortness of breath upon exertion:    Short of breath when lying flat:    Irregular heart rhythm:        Vascular    Pain in calf, thigh, or hip brought on by ambulation:    Pain in feet at night that wakes you up from your sleep:     Blood clot in your veins:    Leg swelling:         Pulmonary    Oxygen at home:    Productive cough:     Wheezing:         Neurologic    Sudden weakness in arms or legs:     Sudden numbness in arms or legs:     Sudden onset of difficulty speaking or slurred speech:    Temporary loss of vision in one eye:     Problems with dizziness:         Gastrointestinal    Blood in stool:     Vomited blood:         Genitourinary    Burning when urinating:     Blood in urine:        Psychiatric    Major depression:         Hematologic    Bleeding problems:    Problems with blood clotting too easily:        Skin    Rashes or ulcers:        Constitutional    Fever or chills:      PHYSICAL EXAMINATION:  Today's Vitals   07/10/23 0819  BP: 131/77  Pulse: (!) 55  Temp: 98 F (36.7 C)  TempSrc: Temporal  SpO2: 94%   Weight: 190 lb 3.2 oz (86.3 kg)  Height: 5\' 9"  (1.753 m)   Body mass index is 28.09 kg/m.   General:  WDWN in NAD; vital signs documented above Gait: Not observed HENT: WNL, normocephalic Pulmonary: normal non-labored breathing  Cardiac: regular HR;  with carotid bruit on the left Abdomen: soft, NT; aortic pulse is not palpable Skin: without rashes Vascular Exam/Pulses:  Right Left  Radial 2+ (normal) 2+ (normal)  Femoral 2+ (normal) 2+ (normal)  Popliteal Unable to palpate Unable to palpate  DP 2+ (normal) 2+ (normal)   Extremities: without open wounds Musculoskeletal: no muscle  wasting or atrophy  Neurologic: A&O X 3 Psychiatric:  The pt has Normal affect.   Non-Invasive Vascular Imaging:   EVAR Arterial duplex on 07/10/2023: Endovascular Aortic Repair (EVAR):  +----------+----------------+-------------------+-------------------+           Diameter AP (cm)Diameter Trans (cm)Velocities (cm/sec)  +----------+----------------+-------------------+-------------------+  Aorta    4.76            4.99               57                   +----------+----------------+-------------------+-------------------+  Right Limb1.35            1.34               49                   +----------+----------------+-------------------+-------------------+  Left Limb 1.50            1.50               48                   +----------+----------------+-------------------+-------------------+  Abdominal Aorta: Patent endovascular aneurysm repair with no evidence of endoleak. Previous diameter measurement was obtained on 06/27/22: 4.83 x 4.97 cm.    Previous EVAR arterial duplex on 06/27/2022: Endovascular Aortic Repair (EVAR):  +----------+----------------+-------------------+-------------------+           Diameter AP (cm)Diameter Trans (cm)Velocities (cm/sec)  +----------+----------------+-------------------+-------------------+  Aorta    4.83            4.97                53                   +----------+----------------+-------------------+-------------------+  Right Limb1.32            1.11               71                   +----------+----------------+-------------------+-------------------+  Left Limb 1.39            1.32               81                   +----------+----------------+-------------------+-------------------+   +-------------+----+  Endoleak TypeNone  +-------------+----+   Summary:  Abdominal Aorta: The largest aortic diameter has decreased compared to prior exam. Previous diameter measurement was 5.0 cm obtained on  12/21/2020.    ASSESSMENT/PLAN:: 73 y.o. male here with hx of  EVAR  of 5.1cm AAA on  05/03/2019 by Dr. Myra Gianotti.   S/p EVAR -duplex today essentially unchanged and he has not had any new abdominal or back pain.  -continue asa/statin -pt will f/u in one year with EVAR duplex.  Left carotid bruit -pt is asymptomatic.  He had carotid duplex in 2020 before bypass surgery that revealed 1-39% bilateral ICA stenosis.  He now has left carotid bruit.  Will bring him back in the next 4-6 weeks at his convenience to get carotid duplex.  Discussed stroke sx and he knows to go to ER should he develop any of these symptoms.    Doreatha Massed, [facility] [phone]  Clinic MD:   Myra Gianotti

## 2023-07-10 ENCOUNTER — Ambulatory Visit (INDEPENDENT_AMBULATORY_CARE_PROVIDER_SITE_OTHER): Payer: Medicare Other | Admitting: Physician Assistant

## 2023-07-10 ENCOUNTER — Ambulatory Visit (HOSPITAL_COMMUNITY)
Admission: RE | Admit: 2023-07-10 | Discharge: 2023-07-10 | Disposition: A | Discharge status: Home / Self Care | Payer: Medicare Other | Source: Ambulatory Visit | Attending: Surgery | Admitting: Surgery

## 2023-07-10 ENCOUNTER — Encounter: Payer: Self-pay | Admitting: Physician Assistant

## 2023-07-10 VITALS — BP 131/77 | HR 55 | Temp 98.0°F | Ht 69.0 in | Wt 190.2 lb

## 2023-07-10 DIAGNOSIS — R0989 Other specified symptoms and signs involving the circulatory and respiratory systems: Secondary | ICD-10-CM

## 2023-07-10 DIAGNOSIS — Z8679 Personal history of other diseases of the circulatory system: Secondary | ICD-10-CM

## 2023-07-10 DIAGNOSIS — I7141 Pararenal abdominal aortic aneurysm, without rupture: Secondary | ICD-10-CM | POA: Diagnosis present

## 2023-07-10 DIAGNOSIS — Z9889 Other specified postprocedural states: Secondary | ICD-10-CM | POA: Diagnosis not present

## 2023-08-14 ENCOUNTER — Other Ambulatory Visit: Payer: Self-pay

## 2023-08-14 DIAGNOSIS — R0989 Other specified symptoms and signs involving the circulatory and respiratory systems: Secondary | ICD-10-CM

## 2023-08-21 ENCOUNTER — Ambulatory Visit: Admitting: Physician Assistant

## 2023-08-21 ENCOUNTER — Ambulatory Visit (HOSPITAL_COMMUNITY)
Admission: RE | Admit: 2023-08-21 | Discharge: 2023-08-21 | Disposition: A | Discharge status: Home / Self Care | Source: Ambulatory Visit | Attending: Surgery | Admitting: Surgery

## 2023-08-21 VITALS — BP 118/64 | HR 56 | Temp 98.0°F | Wt 187.1 lb

## 2023-08-21 DIAGNOSIS — R0989 Other specified symptoms and signs involving the circulatory and respiratory systems: Secondary | ICD-10-CM | POA: Diagnosis present

## 2023-08-21 NOTE — Progress Notes (Addendum)
 HISTORY AND PHYSICAL     CC:  follow up. Requesting Provider:  Eartha Gold, MD  HPI: This is a 73 y.o. male here for follow up for carotid artery stenosis.  Pt is s/p  EVAR of 5.1cm AAA on  05/03/2019 by Dr. Charlotte Cookey.   He was seen in the office several times for bilateral groin pain after his operation that did resolve.  Pt was last seen 07/10/2023 and at that time he was doing well.  A bruit on the left was heard and he was brought back for ultrasound.   Pt returns today for follow up.    Pt denies any amaurosis fugax, speech difficulties, weakness, numbness, paralysis or clumsiness or facial droop.    He has intentionally lost around 15 lbs.  He has been walking about 2 miles a day in his home.  He is weighing every day and has changed his diet.   Pt is an Designer, television/film set.  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    The pt is on a statin for cholesterol management.  The pt is on a daily aspirin .   Other AC:  none The pt is on diuretic, BB for hypertension.   The pt is  on medication for diabetes Tobacco hx:  former  Pt does not have family hx of AAA. Discussion was had a last visit that his son would need to be evaluated for AAA.   Past Medical History:  Diagnosis Date   AAA (abdominal aortic aneurysm) (HCC)    Abdominal aortic aneurysm (AAA) (HCC)    Allergic rhinitis    Anginal pain (HCC)    Centrilobular emphysema (HCC)    Cervical spine degeneration    CHF (congestive heart failure) (HCC)    COPD (chronic obstructive pulmonary disease) (HCC)    Coronary artery disease    Diabetes mellitus without complication (HCC)    Diverticulitis    DJD (degenerative joint disease)    Early cataract    Eczema    GERD (gastroesophageal reflux disease)    H/O hiatal hernia    AGE 7 S  NO MEDS   Hemorrhoids    Herpes simplex infection    Hypercholesterolemia    Hyperglycemia    Hyperlipidemia    Hypertension     Kidney stone    Macular degeneration    right eye   Obesity    Shortness of breath    W/ EXERTION    Wears glasses    Wears partial dentures     Past Surgical History:  Procedure Laterality Date   ABDOMINAL AORTIC ENDOVASCULAR STENT GRAFT N/A 05/03/2019   Procedure: ABDOMINAL AORTIC ENDOVASCULAR STENT GRAFT;  Surgeon: Margherita Shell, MD;  Location: [facility] OR;  Service: Vascular;  Laterality: N/A;   ANTERIOR CERVICAL DECOMP/DISCECTOMY FUSION N/A 06/20/2013   Procedure: CERVCIAL FIVE-SIX,CERVICAL SIX-SEVEN ANTERIOR CERVICAL DECOMPRESSION WITH FUSION INTERBODY PROSTHESIS PLATING AND PEEK CAGE;  Surgeon: Elder Greening, MD;  Location: [facility];  Service: Neurosurgery;  Laterality: N/A;  anterior   CARDIAC CATHETERIZATION     [place]  2007    CATARACT EXTRACTION W/PHACO Right 01/26/2021   Procedure: CATARACT EXTRACTION PHACO AND INTRAOCULAR LENS PLACEMENT (IOC) RIGHT DIABETIC 12.81 01:24.4;  Surgeon: Clair Crews, MD;  Location: [facility];  Service: Ophthalmology;  Laterality: Right;  Diabetic   CATARACT EXTRACTION W/PHACO Left 02/09/2021   Procedure: CATARACT EXTRACTION PHACO AND INTRAOCULAR LENS PLACEMENT (IOC)  LEFT DIABETIC 6.79 00:41.5;  Surgeon: Clair Crews, MD;  Location: [facility];  Service: Ophthalmology;  Laterality: Left;  Diabetic   CHEST TUBE INSERTION Left 10/15/2018   Procedure: INSERTION OF LEFT CHEST TUBE;  Surgeon: Heriberto London, MD;  Location: [facility] OR;  Service: Thoracic;  Laterality: Left;   COLONOSCOPY WITH PROPOFOL  N/A 12/26/2017   Procedure: COLONOSCOPY WITH PROPOFOL ;  Surgeon: Deveron Fly, MD;  Location: [facility] END[facility]COPY;  Service: Endoscopy;  Laterality: N/A;   CORONARY ARTERY BYPASS GRAFT     2007  @[facility]    CORONARY ARTERY BYPASS GRAFT N/A 10/04/2018   Procedure: REDO CORONARY ARTERY BYPASS GRAFTING (CABG) x 1, SVG TO LAD, USING LEFT GREATER SAPHENOUS VEIN HARVESTED ENDOSCOPICALLY;  Surgeon: Heriberto London, MD;  Location: [facility] OR;   Service: Open Heart Surgery;  Laterality: N/A;   CYSTOSCOPY/URETEROSCOPY/HOLMIUM LASER/STENT PLACEMENT Right 03/03/2021   Procedure: CYSTOSCOPY/URETEROSCOPY/HOLMIUM LASER/STENT PLACEMENT;  Surgeon: Lawerence Pressman, MD;  Location: [facility] ORS;  Service: Urology;  Laterality: Right;   EYE SURGERY     RT EYE LASER (RET DETACHMENT)   RIGHT/LEFT HEART CATH AND CORONARY/GRAFT ANGIOGRAPHY Left 09/05/2018   Procedure: RIGHT/LEFT HEART CATH AND CORONARY/GRAFT ANGIOGRAPHY;  Surgeon: Antonette Batters, MD;  Location: [facility] INVASIVE CV LAB;  Service: Cardiovascular;  Laterality: Left;   TEE WITHOUT CARDIOVERSION N/A 10/04/2018   Procedure: TRANSESOPHAGEAL ECHOCARDIOGRAM (TEE);  Surgeon: Matt Song, Donata Fryer, MD;  Location: [facility] OR;  Service: Open Heart Surgery;  Laterality: N/A;   TONSILLECTOMY     ULTRASOUND GUIDANCE FOR VASCULAR ACCESS Bilateral 05/03/2019   Procedure: Ultrasound Guidance For Vascular Access, bilateral femoral arteries;  Surgeon: Margherita Shell, MD;  Location: [facility] OR;  Service: Vascular;  Laterality: Bilateral;   VIDEO BRONCHOSCOPY WITH INSERTION OF INTERBRONCHIAL VALVE (IBV) N/A 10/26/2018   Procedure: VIDEO BRONCHOSCOPY WITH INSERTION OF INTERBRONCHIAL VALVES (IBV);  Surgeon: Matt Song, Donata Fryer, MD;  Location: [facility] OR;  Service: Thoracic;  Laterality: N/A;   VIDEO BRONCHOSCOPY WITH INSERTION OF INTERBRONCHIAL VALVE (IBV) N/A 12/31/2018   Procedure: VIDEO BRONCHOSCOPY WITH REMOVAL OF INTERBRONCHIAL VALVE (IBV);  Surgeon: Matt Song, Donata Fryer, MD;  Location: [facility] OR;  Service: Thoracic;  Laterality: N/A;    Allergies  Allergen Reactions   Atorvastatin  Other (See Comments)    Leg cramps   Sulfa Antibiotics Rash    Sun Rash     Current Outpatient Medications  Medication Sig Dispense Refill   albuterol  (PROVENTIL  HFA;VENTOLIN  HFA) 108 (90 BASE) MCG/ACT inhaler Inhale 2 puffs into the lungs every 6 (six) hours as needed for wheezing or shortness of breath.     albuterol  (PROVENTIL ) (2.5 MG/3ML) 0.083%  nebulizer solution Inhale 2.5 mg into the lungs every 6 (six) hours as needed for wheezing or shortness of breath.     aspirin  EC 81 MG tablet Take 81 mg by mouth every evening.      co-enzyme Q-10 30 MG capsule Take 30 mg by mouth daily.     fexofenadine (ALLEGRA) 180 MG tablet Take 180 mg by mouth daily.     Folic Acid  0.8 MG CAPS      hydrochlorothiazide (HYDRODIURIL) 25 MG tablet Take 25 mg by mouth daily.     losartan  (COZAAR ) 25 MG tablet Take 25 mg by mouth daily.     metFORMIN  (GLUCOPHAGE ) 500 MG tablet Take 500 mg by mouth daily.     metoprolol  tartrate (LOPRESSOR ) 25 MG tablet Take 1 tablet (25 mg total) by mouth 2 (two) times daily. 60 tablet 3  montelukast  (SINGULAIR ) 10 MG tablet Take 10 mg by mouth every evening.     omeprazole (PRILOSEC) 40 MG capsule Take 40 mg by mouth daily.     roflumilast  (DALIRESP ) 500 MCG TABS tablet Take 500 mcg by mouth daily.     rosuvastatin  (CRESTOR ) 40 MG tablet Take 40 mg by mouth daily.     SPIRIVA  RESPIMAT 2.5 MCG/ACT AERS SMARTSIG:2 Puff(s) Via Inhaler Daily     tadalafil  (CIALIS ) 20 MG tablet Take 1 tablet (20 mg total) by mouth daily. 90 tablet 3   triamcinolone cream (KENALOG) 0.1 %      valACYclovir  (VALTREX ) 500 MG tablet Take 500 mg by mouth every evening.     vitamin B-12 (CYANOCOBALAMIN ) 1000 MCG tablet Take 1,000 mcg by mouth daily.     Vitamin D-Vitamin K (K2 PLUS D3 PO) Take 1 tablet by mouth daily.     WIXELA INHUB 250-50 MCG/ACT AEPB Inhale 1 puff into the lungs 2 (two) times daily.     azelastine  (ASTELIN ) 0.1 % nasal spray Place 1 spray into both nostrils daily. Use in each nostril as directed     No current facility-administered medications for this visit.    Family History  Problem Relation Age of Onset   Stroke Mother    Stroke Father     Social History   Socioeconomic History   Marital status: Married    Spouse name: Not on file   Number of children: Not on file   Years of education: Not on file   Highest  education level: Not on file  Occupational History   Not on file  Tobacco Use   Smoking status: Former    Current packs/day: 0.00    Average packs/day: 2.0 packs/day for 43.0 years (86.0 ttl pk-yrs)    Types: Cigarettes    Start date: 04/11/1961    Quit date: 04/11/2004    Years since quitting: 19.3    Passive exposure: Past   Smokeless tobacco: Never  Vaping Use   Vaping status: Never Used  Substance and Sexual Activity   Alcohol  use: Yes    Alcohol /week: 7.0 standard drinks of alcohol     Types: 7 Standard drinks or equivalent per week   Drug use: No   Sexual activity: Not Currently  Other Topics Concern   Not on file  Social History Narrative   Not on file   Social Drivers of Health   Financial Resource Strain: Low Risk  (06/05/2023)   Received from [facility]   Overall Financial Resource Strain (CARDIA)    Difficulty of Paying Living Expenses: Not hard at all  Food Insecurity: No Food Insecurity (06/05/2023)   Received from [facility]   Hunger Vital Sign    Worried About Running Out of Food in the Last Year: Never true    Ran Out of Food in the Last Year: Never true  Transportation Needs: No Transportation Needs (06/05/2023)   Received from [facility] - Transportation    In the past 12 months, has lack of transportation kept you from medical appointments or from getting medications?: No    Lack of Transportation (Non-Medical): No  Physical Activity: Low Risk  (02/28/2022)   [facility], [facility]   PCARE Exercise SDOH    Exercise: Active Lifestyle Only    PCare Exercise SDOH: Not on file    PCare Exercise SDOH: Not on file  Stress:  No Stress Concern Present (07/04/2019)   Received from [facility], [facility]   [employer] of Occupational Health - Occupational Stress Questionnaire    Feeling of Stress : Not at all   Social Connections: Socially Integrated (07/04/2019)   Received from [facility], [facility]   Social Connection and Isolation Panel [NHANES]    Frequency of Communication with Friends and Family: More than three times a week    Frequency of Social Gatherings with Friends and Family: Twice a week    Attends Religious Services: More than 4 times per year    Active Member of [employer] or Organizations: Yes    Attends Banker Meetings: 1 to 4 times per year    Marital Status: Married  Catering manager Violence: Not At Risk (01/27/2023)   Humiliation, Afraid, Rape, and Kick questionnaire    Fear of Current or Ex-Partner: No    Emotionally Abused: No    Physically Abused: No    Sexually Abused: No     REVIEW OF SYSTEMS:   [X]  denotes positive finding, [ ]  denotes negative finding Cardiac  Comments:  Chest pain or chest pressure:    Shortness of breath upon exertion:    Short of breath when lying flat:    Irregular heart rhythm:        Vascular    Pain in calf, thigh, or hip brought on by ambulation:    Pain in feet at night that wakes you up from your sleep:     Blood clot in your veins:    Leg swelling:         Pulmonary    Oxygen at home:    Productive cough:     Wheezing:         Neurologic    Sudden weakness in arms or legs:     Sudden numbness in arms or legs:     Sudden onset of difficulty speaking or slurred speech:    Temporary loss of vision in one eye:     Problems with dizziness:         Gastrointestinal    Blood in stool:     Vomited blood:         Genitourinary    Burning when urinating:     Blood in urine:        Psychiatric    Major depression:         Hematologic    Bleeding problems:    Problems with blood clotting too easily:        Skin    Rashes or ulcers:        Constitutional    Fever or chills:      PHYSICAL EXAMINATION:  Today's Vitals   08/21/23 1224 08/21/23 1227  BP: 124/72  118/64  Pulse: (!) 56   Temp: 98 F (36.7 C)   TempSrc: Temporal   SpO2: 96%   Weight: 187 lb 1.6 oz (84.9 kg)   PainSc: 0-No pain    Body mass index is 27.63 kg/m.  General:  WDWN in NAD; vital signs documented above Gait: Not observed HENT: WNL, normocephalic Pulmonary: normal non-labored breathing Cardiac: regular HR Abdomen: soft, NT; aortic pulse is not palpable Skin: without rashes Vascular Exam/Pulses:  Right Left  Radial 2+ (normal) 2+ (normal)  DP 2+ (normal) 2+ (normal)   Extremities: without open wounds Musculoskeletal: no muscle wasting or atrophy  Neurologic: A&O X 3;  moving all extremities equally; speech is fluent/normal Psychiatric:  The pt has Normal affect.   Non-Invasive Vascular Imaging:   Carotid Duplex on 08/21/2023 Right:  1-39% ICA stenosis Left:  1-39% ICA stenosis Vertebrals:  Bilateral vertebral arteries demonstrate antegrade flow.  Subclavians: Normal flow hemodynamics were seen in bilateral subclavian arteries      ASSESSMENT/PLAN:: 73 y.o. male here for follow up carotid artery stenosis and a left carotid bruit was heard at his last visit  -duplex today reveals bilateral 1-39%.  He has not had any neurological events. -given the above results, should not need another carotid duplex unless he has issues.  -pt will f/u at his regular [facility] visit next year.  He willcall sooner should he have any issues. -continue statin/asa    Maryanna Smart, [facility] [phone]  Clinic MD:  Charlotte Cookey

## 2023-08-27 ENCOUNTER — Encounter: Payer: Self-pay | Admitting: Emergency Medicine

## 2023-08-27 ENCOUNTER — Ambulatory Visit (INDEPENDENT_AMBULATORY_CARE_PROVIDER_SITE_OTHER)

## 2023-08-27 ENCOUNTER — Ambulatory Visit
Admission: EM | Admit: 2023-08-27 | Discharge: 2023-08-27 | Disposition: A | Discharge status: Home / Self Care | Attending: Physician Assistant | Admitting: Physician Assistant

## 2023-08-27 DIAGNOSIS — M79601 Pain in right arm: Secondary | ICD-10-CM

## 2023-08-27 DIAGNOSIS — W19XXXA Unspecified fall, initial encounter: Secondary | ICD-10-CM

## 2023-08-27 DIAGNOSIS — S40811A Abrasion of right upper arm, initial encounter: Secondary | ICD-10-CM

## 2023-08-27 DIAGNOSIS — S40021A Contusion of right upper arm, initial encounter: Secondary | ICD-10-CM

## 2023-08-27 MED ORDER — MUPIROCIN 2 % EX OINT: 1.0000 | TOPICAL_OINTMENT | Freq: Two times a day (BID) | CUTANEOUS | 0 refills | Status: AC

## 2023-08-27 NOTE — ED Provider Notes (Signed)
 [facility]    CSN: [id number] Arrival date & time: 08/27/23  0801      History   Chief Complaint Chief Complaint  Patient presents with   Fall   Abrasion    Right arm    HPI Melvin Brewer is a 73 y.o. male.   Patient presents today with a 18-hour history of multiple abrasions and pain in his right arm.  Reports that he was working on his truck in the garage when he tripped over a bucket causing him to fall.  He is confident that he did not hit his head and denies any loss of consciousness, headache, dizziness, nausea, vomiting.  He had multiple abrasions to his right arm and so cleaned his with hydrogen peroxide and then dressed them with a nonstick gauze.  He is right-handed.  He denies any numbness or paresthesias in his hand.  He does not take any blood thinning medication other than a baby aspirin  daily.  He is up-to-date on his tetanus which was last given in 2023.  He reports that the discomfort is more related to the abrasions and not necessarily musculoskeletal.  Discomfort is rated 8 on a 0-10 pain scale, described as burning/soreness, no alleviating factors identified.    Past Medical History:  Diagnosis Date   AAA (abdominal aortic aneurysm) (HCC)    Abdominal aortic aneurysm (AAA) (HCC)    Allergic rhinitis    Anginal pain (HCC)    Centrilobular emphysema (HCC)    Cervical spine degeneration    CHF (congestive heart failure) (HCC)    COPD (chronic obstructive pulmonary disease) (HCC)    Coronary artery disease    Diabetes mellitus without complication (HCC)    Diverticulitis    DJD (degenerative joint disease)    Early cataract    Eczema    GERD (gastroesophageal reflux disease)    H/O hiatal hernia    AGE 67 S  NO MEDS   Hemorrhoids    Herpes simplex infection    Hypercholesterolemia    Hyperglycemia    Hyperlipidemia    Hypertension    Kidney stone    Macular degeneration    right eye   Obesity    Shortness of breath    W/  EXERTION    Wears glasses    Wears partial dentures     Patient Active Problem List   Diagnosis Date Noted   CKD (chronic kidney disease) stage 3, GFR 30-59 ml/min (HCC) 02/13/2023   Macrocytic anemia 01/27/2023   Prediabetes 12/10/2021   Benign essential hypertension 05/21/2019   Hyperkalemia 05/21/2019   Proteinuria 05/21/2019   Type 2 diabetes mellitus with other diabetic kidney complication (HCC) 05/21/2019   Abdominal aortic aneurysm (AAA) (HCC) 05/03/2019   S/P AAA repair 05/03/2019   S/P Redo Sternotomy, CABG x 1, SVG to LAD 10/04/2018    Past Surgical History:  Procedure Laterality Date   ABDOMINAL AORTIC ENDOVASCULAR STENT GRAFT N/A 05/03/2019   Procedure: ABDOMINAL AORTIC ENDOVASCULAR STENT GRAFT;  Surgeon: Margherita Shell, MD;  Location: [facility] OR;  Service: Vascular;  Laterality: N/A;   ANTERIOR CERVICAL DECOMP/DISCECTOMY FUSION N/A 06/20/2013   Procedure: CERVCIAL FIVE-SIX,CERVICAL SIX-SEVEN ANTERIOR CERVICAL DECOMPRESSION WITH FUSION INTERBODY PROSTHESIS PLATING AND PEEK CAGE;  Surgeon: Elder Greening, MD;  Location: [facility];  Service: Neurosurgery;  Laterality: N/A;  anterior   CARDIAC CATHETERIZATION     [place]  2007    CATARACT EXTRACTION W/PHACO Right 01/26/2021   Procedure: CATARACT EXTRACTION PHACO  AND INTRAOCULAR LENS PLACEMENT (IOC) RIGHT DIABETIC 12.81 01:24.4;  Surgeon: Clair Crews, MD;  Location: [facility];  Service: Ophthalmology;  Laterality: Right;  Diabetic   CATARACT EXTRACTION W/PHACO Left 02/09/2021   Procedure: CATARACT EXTRACTION PHACO AND INTRAOCULAR LENS PLACEMENT (IOC) LEFT DIABETIC 6.79 00:41.5;  Surgeon: Clair Crews, MD;  Location: [facility];  Service: Ophthalmology;  Laterality: Left;  Diabetic   CHEST TUBE INSERTION Left 10/15/2018   Procedure: INSERTION OF LEFT CHEST TUBE;  Surgeon: Heriberto London, MD;  Location: [facility] OR;  Service: Thoracic;  Laterality: Left;   COLONOSCOPY WITH PROPOFOL  N/A 12/26/2017    Procedure: COLONOSCOPY WITH PROPOFOL ;  Surgeon: Deveron Fly, MD;  Location: [facility] ENDOSCOPY;  Service: Endoscopy;  Laterality: N/A;   CORONARY ARTERY BYPASS GRAFT     2007  @[facility]    CORONARY ARTERY BYPASS GRAFT N/A 10/04/2018   Procedure: REDO CORONARY ARTERY BYPASS GRAFTING (CABG) x 1, SVG TO LAD, USING LEFT GREATER SAPHENOUS VEIN HARVESTED ENDOSCOPICALLY;  Surgeon: Heriberto London, MD;  Location: [facility] OR;  Service: Open Heart Surgery;  Laterality: N/A;   CYSTOSCOPY/URETEROSCOPY/HOLMIUM LASER/STENT PLACEMENT Right 03/03/2021   Procedure: CYSTOSCOPY/URETEROSCOPY/HOLMIUM LASER/STENT PLACEMENT;  Surgeon: Lawerence Pressman, MD;  Location: [facility] ORS;  Service: Urology;  Laterality: Right;   EYE SURGERY     RT EYE LASER (RET DETACHMENT)   RIGHT/LEFT HEART CATH AND CORONARY/GRAFT ANGIOGRAPHY Left 09/05/2018   Procedure: RIGHT/LEFT HEART CATH AND CORONARY/GRAFT ANGIOGRAPHY;  Surgeon: Antonette Batters, MD;  Location: [facility] INVASIVE CV LAB;  Service: Cardiovascular;  Laterality: Left;   TEE WITHOUT CARDIOVERSION N/A 10/04/2018   Procedure: TRANSESOPHAGEAL ECHOCARDIOGRAM (TEE);  Surgeon: Matt Song, Donata Fryer, MD;  Location: [facility] OR;  Service: Open Heart Surgery;  Laterality: N/A;   TONSILLECTOMY     ULTRASOUND GUIDANCE FOR VASCULAR ACCESS Bilateral 05/03/2019   Procedure: Ultrasound Guidance For Vascular Access, bilateral femoral arteries;  Surgeon: Margherita Shell, MD;  Location: [facility] OR;  Service: Vascular;  Laterality: Bilateral;   VIDEO BRONCHOSCOPY WITH INSERTION OF INTERBRONCHIAL VALVE (IBV) N/A 10/26/2018   Procedure: VIDEO BRONCHOSCOPY WITH INSERTION OF INTERBRONCHIAL VALVES (IBV);  Surgeon: Matt Song, Donata Fryer, MD;  Location: [facility] OR;  Service: Thoracic;  Laterality: N/A;   VIDEO BRONCHOSCOPY WITH INSERTION OF INTERBRONCHIAL VALVE (IBV) N/A 12/31/2018   Procedure: VIDEO BRONCHOSCOPY WITH REMOVAL OF INTERBRONCHIAL VALVE (IBV);  Surgeon: Matt Song, Donata Fryer, MD;  Location: [facility] OR;  Service: Thoracic;  Laterality: N/A;        Home Medications    Prior to Admission medications   Medication Sig Start Date End Date Taking? Authorizing Provider  mupirocin  ointment (BACTROBAN ) 2 % Apply 1 Application topically 2 (two) times daily. 08/27/23  Yes Chayse Zatarain K, PA-C  albuterol  (PROVENTIL  HFA;VENTOLIN  HFA) 108 (90 BASE) MCG/ACT inhaler Inhale 2 puffs into the lungs every 6 (six) hours as needed for wheezing or shortness of breath.    [provider]  albuterol  (PROVENTIL ) (2.5 MG/3ML) 0.083% nebulizer solution Inhale 2.5 mg into the lungs every 6 (six) hours as needed for wheezing or shortness of breath. 04/27/18   [provider]  aspirin  EC 81 MG tablet Take 81 mg by mouth every evening.     [provider]  azelastine  (ASTELIN ) 0.1 % nasal spray Place 1 spray into both nostrils daily. Use in each nostril as directed    [provider]  co-enzyme Q-10 30 MG capsule Take 30 mg by mouth daily.    [provider]  fexofenadine (ALLEGRA) 180 MG  tablet Take 180 mg by mouth daily.    [provider]  Folic Acid  0.8 MG CAPS  01/20/23   [provider]  hydrochlorothiazide (HYDRODIURIL) 25 MG tablet Take 25 mg by mouth daily. 01/12/23   [provider]  losartan  (COZAAR ) 25 MG tablet Take 25 mg by mouth daily.    [provider]  metFORMIN  (GLUCOPHAGE ) 500 MG tablet Take 500 mg by mouth daily.    [provider]  metoprolol  tartrate (LOPRESSOR ) 25 MG tablet Take 1 tablet (25 mg total) by mouth 2 (two) times daily. 11/01/18   Barrett, Glenda Spelman R, PA-C  montelukast  (SINGULAIR ) 10 MG tablet Take 10 mg by mouth every evening. 07/02/18   [provider]  omeprazole (PRILOSEC) 40 MG capsule Take 40 mg by mouth daily. 01/05/23   [provider]  roflumilast  (DALIRESP ) 500 MCG TABS tablet Take 500 mcg by mouth daily. 11/05/21   [provider]  rosuvastatin  (CRESTOR ) 40 MG tablet Take 40 mg by mouth daily.    [provider]  SPIRIVA  RESPIMAT 2.5 MCG/ACT AERS SMARTSIG:2 Puff(s) Via Inhaler Daily 03/16/21   [provider]  tadalafil  (CIALIS ) 20 MG tablet Take 1 tablet (20 mg total) by mouth daily. 11/30/22   Lawerence Pressman, MD  triamcinolone cream (KENALOG) 0.1 %  03/11/21   [provider]  valACYclovir  (VALTREX ) 500 MG tablet Take 500 mg by mouth every evening. 06/13/18   [provider]  vitamin B-12 (CYANOCOBALAMIN ) 1000 MCG tablet Take 1,000 mcg by mouth daily.    [provider]  Vitamin D-Vitamin K (K2 PLUS D3 PO) Take 1 tablet by mouth daily.    [provider]  Zachery Hermes INHUB 250-50 MCG/ACT AEPB Inhale 1 puff into the lungs 2 (two) times daily. 10/17/22   [provider]    Family History Family History  Problem Relation Age of Onset   Stroke Mother    Stroke Father     Social History Social History   Tobacco Use   Smoking status: Former    Current packs/day: 0.00    Average packs/day: 2.0 packs/day for 43.0 years (86.0 ttl pk-yrs)    Types: Cigarettes    Start date: 04/11/1961    Quit date: 04/11/2004    Years since quitting: 19.3    Passive exposure: Past   Smokeless tobacco: Never  Vaping Use   Vaping status: Never Used  Substance Use Topics   Alcohol  use: Yes    Alcohol /week: 7.0 standard drinks of alcohol     Types: 7 Standard drinks or equivalent per week   Drug use: No     Allergies   Atorvastatin  and Sulfa antibiotics   Review of Systems Review of Systems  Constitutional:  Positive for activity change. Negative for appetite change, fatigue and fever.  Eyes:  Negative for visual disturbance.  Gastrointestinal:  Negative for abdominal pain, diarrhea, nausea and vomiting.  Musculoskeletal:  Positive for arthralgias. Negative for myalgias.  Skin:  Positive for wound. Negative for color change.  Neurological:  Negative for dizziness, syncope, weakness, light-headedness, numbness and headaches.     Physical  Exam Triage Vital Signs ED Triage Vitals  Encounter Vitals Group     BP 08/27/23 0811 (!) 152/75     Systolic BP Percentile --      Diastolic BP Percentile --      Pulse Rate 08/27/23 0811 60     Resp 08/27/23 0811 15     Temp 08/27/23 0811 98  F (36.7 C)     Temp Source 08/27/23 0811 Oral     SpO2 08/27/23 0811 97 %     Weight 08/27/23 0810 187 lb 2.7 oz (84.9 kg)     Height 08/27/23 0810 5\' 9"  (1.753 m)     Head Circumference --      Peak Flow --      Pain Score 08/27/23 0809 8     Pain Loc --      Pain Education --      Exclude from Growth Chart --    No data found.  Updated Vital Signs BP (!) 152/75 (BP Location: Left Arm)   Pulse 60   Temp 98 F (36.7 C) (Oral)   Resp 15   Ht 5\' 9"  (1.753 m)   Wt 187 lb 2.7 oz (84.9 kg)   SpO2 97%   BMI 27.64 kg/m   Visual Acuity Right Eye Distance:   Left Eye Distance:   Bilateral Distance:    Right Eye Near:   Left Eye Near:    Bilateral Near:     Physical Exam Vitals reviewed.  Constitutional:      General: He is awake.     Appearance: Normal appearance. He is well-developed. He is not ill-appearing.     Comments: Very pleasant male appears stated age in no acute distress sitting comfortably in exam room  HENT:     Head: Normocephalic and atraumatic.     Right Ear: Tympanic membrane, ear canal and external ear normal. No hemotympanum.     Left Ear: Tympanic membrane, ear canal and external ear normal. No hemotympanum.     Nose: Nose normal.     Mouth/Throat:     Tongue: Tongue does not deviate from midline.     Pharynx: Uvula midline. No oropharyngeal exudate or posterior oropharyngeal erythema.  Eyes:     Extraocular Movements: Extraocular movements intact.     Pupils: Pupils are equal, round, and reactive to light.  Cardiovascular:     Rate and Rhythm: Normal rate and regular rhythm.     Heart sounds: Normal heart sounds, S1 normal and S2 normal. No murmur heard.    Comments: Capillary refill within 2 seconds  right fingers Pulmonary:     Effort: Pulmonary effort is normal. No accessory muscle usage or respiratory distress.     Breath sounds: Normal breath sounds. No stridor. No wheezing, rhonchi or rales.     Comments: Clear to auscultation bilaterally Musculoskeletal:     Right forearm: Laceration and tenderness present. No swelling or deformity.     Right wrist: No swelling, tenderness, bony tenderness or snuff box tenderness.     Right hand: Normal range of motion. There is no disruption of two-point discrimination. Normal capillary refill.     Cervical back: Normal range of motion and neck supple.     Comments: Right forearm/wrist/hand: Significant bruising with multiple skin tears noted ulnar forearm.  Larger skin tear measures approximately 3 cm x 2 cm with several more linear skin tears measuring 3 cm and 1.5 cm.  No deformity or focal tenderness.  Normal active range of motion at elbow, wrist, hand.  Normal pincer grip strength.  Hand is neurovascularly intact.  Skin:    Findings: Abrasion and bruising present.  Neurological:     General: No focal deficit present.     Mental Status: He is alert and oriented to person, place, and time.     Cranial Nerves: Cranial nerves 2-12  are intact.     Motor: Motor function is intact.     Gait: Gait is intact.  Psychiatric:        Behavior: Behavior is cooperative.       UC Treatments / Results  Labs (all labs ordered are listed, but only abnormal results are displayed) Labs Reviewed - No data to display  EKG   Radiology DG Forearm Right Result Date: 08/27/2023 CLINICAL DATA:  Pain and bruising after fall yesterday. EXAM: RIGHT FOREARM - 2 VIEW COMPARISON:  None Available. FINDINGS: Normal bone mineralization. Mild radiocarpal joint space narrowing. No elbow joint effusion. No acute fracture or dislocation. IMPRESSION: 1. No acute fracture. 2. Mild radiocarpal osteoarthritis. Electronically Signed   By: Bertina Broccoli M.D.   On: 08/27/2023  08:58    Procedures Procedures (including critical care time)  Medications Ordered in UC Medications - No data to display  Initial Impression / Assessment and Plan / UC Course  I have reviewed the triage vital signs and the nursing notes.  Pertinent labs & imaging results that were available during my care of the patient were reviewed by me and considered in my medical decision making (see chart for details).     Patient is well-appearing, afebrile, nontoxic, nontachycardic.  Patient is adamant that he did not hit his head and his neuroexam is appropriate in clinic.  X-ray of the right forearm was obtained that showed no acute osseous abnormality but did show degenerative changes.  Wounds were cleaned with chlorhexidine  and saline and then dressed in clinic.  He has a skin avulsion that does not approximate and so no indication for primary closure.  He was encouraged to keep the area clean and apply Bactroban  ointment with dressing changes.  We discussed that if he has any signs of secondary infection he would need to return for reevaluation.  His tetanus is up-to-date.  We discussed that if he has any worsening or changing symptoms including increasing pain, swelling in his arm, numbness or paresthesias in the fingers, discoloration, cold sensation in his fingers he needs to be seen immediately.  Strict return precautions given.  All questions were answered to patient satisfaction.  Final Clinical Impressions(s) / UC Diagnoses   Final diagnoses:  Right arm pain  Contusion of right upper extremity, initial encounter  Abrasion of right upper extremity, initial encounter  Fall, initial encounter     Discharge Instructions      Your x-ray was normal with no evidence of a broken bone which is great news.  Keep the wounds on your arm clean.  Apply Bactroban  ointment twice daily with dressing changes.  If you have any signs of infection including redness, swelling, drainage, fever, nausea,  vomiting you should be seen immediately.  If anything changes and you have increasing pain, numbness or tingling in your hand, trouble moving your hand you should be seen immediately.   ED Prescriptions     Medication Sig Dispense Auth. Provider   mupirocin  ointment (BACTROBAN ) 2 % Apply 1 Application topically 2 (two) times daily. 22 g Crissa Sowder K, PA-C      PDMP not reviewed this encounter.   Budd Cargo, PA-C 08/27/23 [id number]

## 2023-08-27 NOTE — Discharge Instructions (Signed)
 Your x-ray was normal with no evidence of a broken bone which is great news.  Keep the wounds on your arm clean.  Apply Bactroban  ointment twice daily with dressing changes.  If you have any signs of infection including redness, swelling, drainage, fever, nausea, vomiting you should be seen immediately.  If anything changes and you have increasing pain, numbness or tingling in your hand, trouble moving your hand you should be seen immediately.

## 2023-08-27 NOTE — ED Triage Notes (Signed)
 Patient states that around 2pm yesterday he tripped and fell in his garage and injured his right forearm.  Patient has abrasions and skin tears to his right arm.

## 2023-12-05 ENCOUNTER — Ambulatory Visit (INDEPENDENT_AMBULATORY_CARE_PROVIDER_SITE_OTHER): Payer: Self-pay | Admitting: Urology

## 2023-12-05 VITALS — BP 177/82 | HR 56 | Wt 186.9 lb

## 2023-12-05 DIAGNOSIS — N200 Calculus of kidney: Secondary | ICD-10-CM | POA: Diagnosis not present

## 2023-12-05 DIAGNOSIS — N529 Male erectile dysfunction, unspecified: Secondary | ICD-10-CM

## 2023-12-05 MED ORDER — TADALAFIL 20 MG PO TABS: 20.0000 mg | ORAL_TABLET | Freq: Every day | ORAL | 3 refills | Status: AC

## 2023-12-05 NOTE — Patient Instructions (Addendum)

## 2023-12-05 NOTE — Progress Notes (Signed)
   12/05/2023 10:10 AM   Elna CHRISTELLA [place] 1950-11-14 [id number]  Reason for visit: Follow up ED, history of nephrolithiasis, PSA screening  HPI: 73 year old male who underwent right ureteroscopy, laser lithotripsy, and stent placement in November 2022 for a 7 mm right distal ureteral stone, and stent was removed in clinic.  Cystoscopy was normal at that time, and prostate measured 20 g on CT.   He denies any problems over the last year, specifically no UTIs, gross hematuria, or stone events.  KUB August 2024 was benign.  Using Cialis  20 mg daily with moderate results for ED.  He takes metoprolol  twice daily which likely contributes to his ED.  Prior testosterone  was normal.  I again recommended discussing with PCP/cardiologist alternative blood pressure medication.  He has previously trialed sildenafil with no improvement.  He is not interested in penile injections.  PSA last checked in February 2022 and was normal at 0.92.  Can discontinue screening per guideline recommendations.  We discussed general stone prevention strategies including adequate hydration with goal of producing 2.5 L of urine daily, increasing citric acid intake, increasing calcium  intake during high oxalate meals, minimizing animal protein, and decreasing salt intake. Information about dietary recommendations given today.    -Cialis  20 mg daily refilled, recommended discussing alternative to metoprolol  with cardiology -RTC 1 year symptom check, KUB prior to  Redell JAYSON Burnet, MD  [facility] [address] [place], [place] [address] [phone]   [facility] [address], [address] [place], [place] [address] [phone]

## 2023-12-06 ENCOUNTER — Ambulatory Visit: Admitting: Vascular Surgery

## 2023-12-20 ENCOUNTER — Other Ambulatory Visit: Payer: Self-pay | Admitting: Physician Assistant

## 2023-12-20 ENCOUNTER — Ambulatory Visit
Admission: RE | Admit: 2023-12-20 | Discharge: 2023-12-20 | Disposition: A | Discharge status: Home / Self Care | Source: Ambulatory Visit | Attending: Physician Assistant | Admitting: Physician Assistant

## 2023-12-20 DIAGNOSIS — M7989 Other specified soft tissue disorders: Secondary | ICD-10-CM | POA: Insufficient documentation

## 2024-12-05 ENCOUNTER — Ambulatory Visit: Admitting: Urology
